# Patient Record
Sex: Male | Born: 1940 | Race: White | Hispanic: No | Smoking: Former smoker
Health system: Southern US, Community
[De-identification: ages and names within clinical notes are randomized; demographics above are authoritative.]

## PROBLEM LIST (undated history)

## (undated) DIAGNOSIS — J189 Pneumonia, unspecified organism: Secondary | ICD-10-CM

## (undated) DIAGNOSIS — M1990 Unspecified osteoarthritis, unspecified site: Secondary | ICD-10-CM

## (undated) DIAGNOSIS — G629 Polyneuropathy, unspecified: Secondary | ICD-10-CM

## (undated) DIAGNOSIS — J449 Chronic obstructive pulmonary disease, unspecified: Secondary | ICD-10-CM

## (undated) DIAGNOSIS — I10 Essential (primary) hypertension: Secondary | ICD-10-CM

## (undated) DIAGNOSIS — I5021 Acute systolic (congestive) heart failure: Secondary | ICD-10-CM

## (undated) DIAGNOSIS — I219 Acute myocardial infarction, unspecified: Secondary | ICD-10-CM

## (undated) HISTORY — PX: KNEE SURGERY: SHX244

## (undated) HISTORY — DX: Acute systolic (congestive) heart failure: I50.21

## (undated) HISTORY — PX: TONSILLECTOMY: SUR1361

## (undated) HISTORY — DX: Acute myocardial infarction, unspecified: I21.9

## (undated) HISTORY — PX: CARDIAC CATHETERIZATION: SHX172

---

## 2015-03-15 DIAGNOSIS — M170 Bilateral primary osteoarthritis of knee: Secondary | ICD-10-CM | POA: Insufficient documentation

## 2016-03-17 ENCOUNTER — Encounter (HOSPITAL_BASED_OUTPATIENT_CLINIC_OR_DEPARTMENT_OTHER): Payer: Self-pay | Admitting: *Deleted

## 2016-03-17 ENCOUNTER — Emergency Department (HOSPITAL_BASED_OUTPATIENT_CLINIC_OR_DEPARTMENT_OTHER)
Admission: EM | Admit: 2016-03-17 | Discharge: 2016-03-17 | Disposition: A | Discharge status: Home / Self Care | Payer: Medicare Other | Attending: Emergency Medicine | Admitting: Emergency Medicine

## 2016-03-17 ENCOUNTER — Emergency Department (HOSPITAL_BASED_OUTPATIENT_CLINIC_OR_DEPARTMENT_OTHER): Payer: Medicare Other

## 2016-03-17 DIAGNOSIS — S6992XA Unspecified injury of left wrist, hand and finger(s), initial encounter: Secondary | ICD-10-CM | POA: Diagnosis present

## 2016-03-17 DIAGNOSIS — Z87891 Personal history of nicotine dependence: Secondary | ICD-10-CM | POA: Insufficient documentation

## 2016-03-17 DIAGNOSIS — S62112A Displaced fracture of triquetrum [cuneiform] bone, left wrist, initial encounter for closed fracture: Secondary | ICD-10-CM

## 2016-03-17 DIAGNOSIS — S80212A Abrasion, left knee, initial encounter: Secondary | ICD-10-CM | POA: Diagnosis not present

## 2016-03-17 DIAGNOSIS — I10 Essential (primary) hypertension: Secondary | ICD-10-CM | POA: Diagnosis not present

## 2016-03-17 DIAGNOSIS — M62838 Other muscle spasm: Secondary | ICD-10-CM | POA: Insufficient documentation

## 2016-03-17 DIAGNOSIS — Y92815 Train as the place of occurrence of the external cause: Secondary | ICD-10-CM | POA: Diagnosis not present

## 2016-03-17 DIAGNOSIS — Z79899 Other long term (current) drug therapy: Secondary | ICD-10-CM | POA: Insufficient documentation

## 2016-03-17 DIAGNOSIS — Y999 Unspecified external cause status: Secondary | ICD-10-CM | POA: Diagnosis not present

## 2016-03-17 DIAGNOSIS — S62115A Nondisplaced fracture of triquetrum [cuneiform] bone, left wrist, initial encounter for closed fracture: Secondary | ICD-10-CM | POA: Diagnosis not present

## 2016-03-17 DIAGNOSIS — Y9389 Activity, other specified: Secondary | ICD-10-CM | POA: Insufficient documentation

## 2016-03-17 DIAGNOSIS — W010XXA Fall on same level from slipping, tripping and stumbling without subsequent striking against object, initial encounter: Secondary | ICD-10-CM | POA: Diagnosis not present

## 2016-03-17 HISTORY — DX: Essential (primary) hypertension: I10

## 2016-03-17 HISTORY — DX: Polyneuropathy, unspecified: G62.9
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
## 2016-03-17 NOTE — ED Provider Notes (Signed)
Tripped and fell yesterday injuring left wrist as result of fall he suffered abrasion to her left ring finger and to left knee as result. On exam he is in no distress alert Glasgow Coma Score 15 left upper extremity tiny abrasion to tip of left ring finger. Wrist is tender at dorsum. No snuffbox tenderness. Radial pulse 2+. Left lower extremity tiny abrasion to anterior knee.   Doug Sou, MD 03/17/16 2325

## 2016-03-17 NOTE — Discharge Instructions (Signed)
Please follow up with Dr. Pearletha Forge this week for wrist recheck. Leave wrist splint on until you see Dr. Pearletha Forge. Take tylenol for pain. Use heating pad on neck. Follow up with your primary doctor this week for bp recheck. Return to the ED if you develop headache, vision changes, or your neck pain worsens.

## 2016-03-17 NOTE — ED Notes (Signed)
Wound care done to left middle finger. Cleansed and bacitracin applied. Pt also c/o left side neck pain. PA advised.
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
## 2016-03-17 NOTE — ED Triage Notes (Signed)
Pt tripped at [facility] station and fell. C/o pain in left wrist

## 2016-03-21 ENCOUNTER — Encounter: Payer: Self-pay | Admitting: Family Medicine

## 2016-03-21 ENCOUNTER — Ambulatory Visit (INDEPENDENT_AMBULATORY_CARE_PROVIDER_SITE_OTHER): Payer: Medicare Other | Admitting: Family Medicine

## 2016-03-21 DIAGNOSIS — S6992XA Unspecified injury of left wrist, hand and finger(s), initial encounter: Secondary | ICD-10-CM

## 2016-03-21 NOTE — Patient Instructions (Signed)
You have a dorsal avulsion triquetral fracture. These typically do very well with conservative treatment. Wear brace at all times for 2 weeks. After this it's ok for you to take it off to wash the area, ice it for another 2 weeks but still try to wear it all other times. Follow up with me in 4 weeks. You should not need repeat x-rays for this fracture. Tylenol if needed for pain - let me know if you need something stronger than this or ibuprofen.

## 2016-03-22 DIAGNOSIS — S6992XA Unspecified injury of left wrist, hand and finger(s), initial encounter: Secondary | ICD-10-CM | POA: Insufficient documentation

## 2016-03-22 NOTE — Assessment & Plan Note (Signed)
independently reviewed radiographs showing dorsal avulsion fracture of triquetrum.  Reassured patient - expect 4-6 weeks total healing.  Can use wrist brace instead of cast.  Wear at all times the next 2 weeks then ok to take off next 2 weeks to wash and ice area.  F/u in 4 weeks.  Call with any concerns.  Tylenol as needed for pain.
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
## 2016-04-18 ENCOUNTER — Encounter: Payer: Self-pay | Admitting: Family Medicine

## 2016-04-18 ENCOUNTER — Ambulatory Visit: Payer: Medicare Other | Admitting: Family Medicine

## 2016-04-18 ENCOUNTER — Ambulatory Visit (INDEPENDENT_AMBULATORY_CARE_PROVIDER_SITE_OTHER): Payer: Medicare Other | Admitting: Family Medicine

## 2016-04-18 DIAGNOSIS — S6992XD Unspecified injury of left wrist, hand and finger(s), subsequent encounter: Secondary | ICD-10-CM | POA: Diagnosis present
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
## 2016-04-21 NOTE — Assessment & Plan Note (Signed)
2/2 dorsal avulsion fracture of triquetrum.  Clinically healed at this point.  Wrist brace only as needed.  Tylenol as needed.  F/u prn.

## 2017-07-31 DIAGNOSIS — F411 Generalized anxiety disorder: Secondary | ICD-10-CM | POA: Insufficient documentation

## 2017-07-31 DIAGNOSIS — F329 Major depressive disorder, single episode, unspecified: Secondary | ICD-10-CM | POA: Insufficient documentation

## 2018-05-20 DIAGNOSIS — N400 Enlarged prostate without lower urinary tract symptoms: Secondary | ICD-10-CM | POA: Insufficient documentation

## 2018-05-20 DIAGNOSIS — I10 Essential (primary) hypertension: Secondary | ICD-10-CM | POA: Insufficient documentation

## 2018-10-15 ENCOUNTER — Inpatient Hospital Stay (HOSPITAL_COMMUNITY)
Admission: EM | Admit: 2018-10-15 | Discharge: 2018-10-27 | DRG: 246 | Disposition: A | Discharge status: Rehabilitation Facility | Payer: Medicare Other | Attending: Internal Medicine | Admitting: Internal Medicine

## 2018-10-15 ENCOUNTER — Encounter (HOSPITAL_COMMUNITY): Payer: Self-pay | Admitting: Emergency Medicine

## 2018-10-15 ENCOUNTER — Emergency Department (HOSPITAL_COMMUNITY): Payer: Medicare Other

## 2018-10-15 ENCOUNTER — Other Ambulatory Visit: Payer: Self-pay

## 2018-10-15 DIAGNOSIS — N400 Enlarged prostate without lower urinary tract symptoms: Secondary | ICD-10-CM | POA: Diagnosis present

## 2018-10-15 DIAGNOSIS — Z20828 Contact with and (suspected) exposure to other viral communicable diseases: Secondary | ICD-10-CM | POA: Diagnosis present

## 2018-10-15 DIAGNOSIS — I5021 Acute systolic (congestive) heart failure: Secondary | ICD-10-CM | POA: Diagnosis present

## 2018-10-15 DIAGNOSIS — D72828 Other elevated white blood cell count: Secondary | ICD-10-CM | POA: Diagnosis present

## 2018-10-15 DIAGNOSIS — F411 Generalized anxiety disorder: Secondary | ICD-10-CM | POA: Diagnosis not present

## 2018-10-15 DIAGNOSIS — R0902 Hypoxemia: Secondary | ICD-10-CM

## 2018-10-15 DIAGNOSIS — Z79899 Other long term (current) drug therapy: Secondary | ICD-10-CM

## 2018-10-15 DIAGNOSIS — I2109 ST elevation (STEMI) myocardial infarction involving other coronary artery of anterior wall: Principal | ICD-10-CM

## 2018-10-15 DIAGNOSIS — Z87891 Personal history of nicotine dependence: Secondary | ICD-10-CM | POA: Diagnosis not present

## 2018-10-15 DIAGNOSIS — J9601 Acute respiratory failure with hypoxia: Secondary | ICD-10-CM

## 2018-10-15 DIAGNOSIS — I10 Essential (primary) hypertension: Secondary | ICD-10-CM

## 2018-10-15 DIAGNOSIS — R739 Hyperglycemia, unspecified: Secondary | ICD-10-CM | POA: Diagnosis not present

## 2018-10-15 DIAGNOSIS — R042 Hemoptysis: Secondary | ICD-10-CM | POA: Diagnosis not present

## 2018-10-15 DIAGNOSIS — F419 Anxiety disorder, unspecified: Secondary | ICD-10-CM | POA: Diagnosis not present

## 2018-10-15 DIAGNOSIS — I25119 Atherosclerotic heart disease of native coronary artery with unspecified angina pectoris: Secondary | ICD-10-CM | POA: Diagnosis present

## 2018-10-15 DIAGNOSIS — J189 Pneumonia, unspecified organism: Secondary | ICD-10-CM

## 2018-10-15 DIAGNOSIS — I739 Peripheral vascular disease, unspecified: Secondary | ICD-10-CM | POA: Diagnosis not present

## 2018-10-15 DIAGNOSIS — J159 Unspecified bacterial pneumonia: Secondary | ICD-10-CM | POA: Diagnosis present

## 2018-10-15 DIAGNOSIS — E878 Other disorders of electrolyte and fluid balance, not elsewhere classified: Secondary | ICD-10-CM | POA: Diagnosis present

## 2018-10-15 DIAGNOSIS — R1013 Epigastric pain: Secondary | ICD-10-CM | POA: Diagnosis not present

## 2018-10-15 DIAGNOSIS — D72829 Elevated white blood cell count, unspecified: Secondary | ICD-10-CM | POA: Diagnosis not present

## 2018-10-15 DIAGNOSIS — I2510 Atherosclerotic heart disease of native coronary artery without angina pectoris: Secondary | ICD-10-CM | POA: Diagnosis not present

## 2018-10-15 DIAGNOSIS — R778 Other specified abnormalities of plasma proteins: Secondary | ICD-10-CM

## 2018-10-15 DIAGNOSIS — Z720 Tobacco use: Secondary | ICD-10-CM | POA: Diagnosis not present

## 2018-10-15 DIAGNOSIS — E1165 Type 2 diabetes mellitus with hyperglycemia: Secondary | ICD-10-CM | POA: Diagnosis not present

## 2018-10-15 DIAGNOSIS — I4891 Unspecified atrial fibrillation: Secondary | ICD-10-CM | POA: Diagnosis not present

## 2018-10-15 DIAGNOSIS — R7989 Other specified abnormal findings of blood chemistry: Secondary | ICD-10-CM | POA: Diagnosis not present

## 2018-10-15 DIAGNOSIS — Z95828 Presence of other vascular implants and grafts: Secondary | ICD-10-CM | POA: Diagnosis not present

## 2018-10-15 DIAGNOSIS — T380X5A Adverse effect of glucocorticoids and synthetic analogues, initial encounter: Secondary | ICD-10-CM

## 2018-10-15 DIAGNOSIS — R001 Bradycardia, unspecified: Secondary | ICD-10-CM | POA: Diagnosis not present

## 2018-10-15 DIAGNOSIS — J441 Chronic obstructive pulmonary disease with (acute) exacerbation: Secondary | ICD-10-CM | POA: Diagnosis present

## 2018-10-15 DIAGNOSIS — I959 Hypotension, unspecified: Secondary | ICD-10-CM | POA: Diagnosis not present

## 2018-10-15 DIAGNOSIS — Z7289 Other problems related to lifestyle: Secondary | ICD-10-CM | POA: Diagnosis not present

## 2018-10-15 DIAGNOSIS — E669 Obesity, unspecified: Secondary | ICD-10-CM

## 2018-10-15 DIAGNOSIS — E1169 Type 2 diabetes mellitus with other specified complication: Secondary | ICD-10-CM

## 2018-10-15 DIAGNOSIS — I110 Hypertensive heart disease with heart failure: Secondary | ICD-10-CM

## 2018-10-15 DIAGNOSIS — K219 Gastro-esophageal reflux disease without esophagitis: Secondary | ICD-10-CM | POA: Diagnosis present

## 2018-10-15 DIAGNOSIS — R079 Chest pain, unspecified: Secondary | ICD-10-CM | POA: Diagnosis present

## 2018-10-15 DIAGNOSIS — Z9911 Dependence on respirator [ventilator] status: Secondary | ICD-10-CM | POA: Diagnosis not present

## 2018-10-15 DIAGNOSIS — N179 Acute kidney failure, unspecified: Secondary | ICD-10-CM | POA: Diagnosis present

## 2018-10-15 DIAGNOSIS — I509 Heart failure, unspecified: Secondary | ICD-10-CM

## 2018-10-15 DIAGNOSIS — R109 Unspecified abdominal pain: Secondary | ICD-10-CM

## 2018-10-15 DIAGNOSIS — I2102 ST elevation (STEMI) myocardial infarction involving left anterior descending coronary artery: Secondary | ICD-10-CM | POA: Diagnosis not present

## 2018-10-15 DIAGNOSIS — J449 Chronic obstructive pulmonary disease, unspecified: Secondary | ICD-10-CM | POA: Diagnosis not present

## 2018-10-15 DIAGNOSIS — Z955 Presence of coronary angioplasty implant and graft: Secondary | ICD-10-CM | POA: Diagnosis not present

## 2018-10-15 DIAGNOSIS — I712 Thoracic aortic aneurysm, without rupture: Secondary | ICD-10-CM | POA: Diagnosis present

## 2018-10-15 DIAGNOSIS — R21 Rash and other nonspecific skin eruption: Secondary | ICD-10-CM | POA: Diagnosis not present

## 2018-10-15 DIAGNOSIS — J440 Chronic obstructive pulmonary disease with acute lower respiratory infection: Secondary | ICD-10-CM | POA: Diagnosis present

## 2018-10-15 DIAGNOSIS — I5043 Acute on chronic combined systolic (congestive) and diastolic (congestive) heart failure: Secondary | ICD-10-CM | POA: Diagnosis not present

## 2018-10-15 DIAGNOSIS — R159 Full incontinence of feces: Secondary | ICD-10-CM | POA: Diagnosis not present

## 2018-10-15 DIAGNOSIS — R918 Other nonspecific abnormal finding of lung field: Secondary | ICD-10-CM | POA: Diagnosis not present

## 2018-10-15 DIAGNOSIS — F329 Major depressive disorder, single episode, unspecified: Secondary | ICD-10-CM | POA: Diagnosis present

## 2018-10-15 DIAGNOSIS — Z8051 Family history of malignant neoplasm of kidney: Secondary | ICD-10-CM

## 2018-10-15 DIAGNOSIS — E785 Hyperlipidemia, unspecified: Secondary | ICD-10-CM | POA: Diagnosis present

## 2018-10-15 DIAGNOSIS — E1142 Type 2 diabetes mellitus with diabetic polyneuropathy: Secondary | ICD-10-CM | POA: Diagnosis present

## 2018-10-15 DIAGNOSIS — R5381 Other malaise: Secondary | ICD-10-CM | POA: Diagnosis not present

## 2018-10-15 DIAGNOSIS — I400 Infective myocarditis: Secondary | ICD-10-CM | POA: Diagnosis present

## 2018-10-15 DIAGNOSIS — E876 Hypokalemia: Secondary | ICD-10-CM | POA: Diagnosis present

## 2018-10-15 DIAGNOSIS — Z9981 Dependence on supplemental oxygen: Secondary | ICD-10-CM | POA: Diagnosis not present

## 2018-10-15 DIAGNOSIS — Z801 Family history of malignant neoplasm of trachea, bronchus and lung: Secondary | ICD-10-CM

## 2018-10-15 DIAGNOSIS — I480 Paroxysmal atrial fibrillation: Secondary | ICD-10-CM | POA: Diagnosis not present

## 2018-10-15 DIAGNOSIS — Z7984 Long term (current) use of oral hypoglycemic drugs: Secondary | ICD-10-CM

## 2018-10-15 DIAGNOSIS — E871 Hypo-osmolality and hyponatremia: Secondary | ICD-10-CM

## 2018-10-15 DIAGNOSIS — I952 Hypotension due to drugs: Secondary | ICD-10-CM | POA: Diagnosis not present

## 2018-10-15 DIAGNOSIS — Z7982 Long term (current) use of aspirin: Secondary | ICD-10-CM

## 2018-10-15 DIAGNOSIS — Z95820 Peripheral vascular angioplasty status with implants and grafts: Secondary | ICD-10-CM | POA: Diagnosis not present

## 2018-10-15 DIAGNOSIS — Z7951 Long term (current) use of inhaled steroids: Secondary | ICD-10-CM | POA: Diagnosis not present

## 2018-10-15 DIAGNOSIS — Z800 Family history of malignant neoplasm of digestive organs: Secondary | ICD-10-CM

## 2018-10-15 DIAGNOSIS — N5082 Scrotal pain: Secondary | ICD-10-CM | POA: Diagnosis not present

## 2018-10-15 DIAGNOSIS — I4510 Unspecified right bundle-branch block: Secondary | ICD-10-CM | POA: Diagnosis present

## 2018-10-15 LAB — CBC
HCT: 45.5 % (ref 39.0–52.0)
HCT: 46.3 % (ref 39.0–52.0)
Hemoglobin: 14.5 g/dL (ref 13.0–17.0)
Hemoglobin: 15.1 g/dL (ref 13.0–17.0)
MCH: 30.5 pg (ref 26.0–34.0)
MCH: 30.4 pg (ref 26.0–34.0)
MCHC: 31.9 g/dL (ref 30.0–36.0)
MCHC: 32.6 g/dL (ref 30.0–36.0)
MCV: 95.6 fL (ref 80.0–100.0)
MCV: 93.3 fL (ref 80.0–100.0)
Platelets: 281 10*3/uL (ref 150–400)
Platelets: 291 10*3/uL (ref 150–400)
RBC: 4.76 MIL/uL (ref 4.22–5.81)
RBC: 4.96 MIL/uL (ref 4.22–5.81)
RDW: 13.2 % (ref 11.5–15.5)
RDW: 13.2 % (ref 11.5–15.5)
WBC: 16.6 10*3/uL — ABNORMAL HIGH (ref 4.0–10.5)
WBC: 17.2 10*3/uL — ABNORMAL HIGH (ref 4.0–10.5)
nRBC: 0 % (ref 0.0–0.2)
nRBC: 0 % (ref 0.0–0.2)

## 2018-10-15 LAB — BASIC METABOLIC PANEL
Anion gap: 15 (ref 5–15)
BUN: 15 mg/dL (ref 8–23)
CO2: 21 mmol/L — ABNORMAL LOW (ref 22–32)
Calcium: 9.1 mg/dL (ref 8.9–10.3)
Chloride: 97 mmol/L — ABNORMAL LOW (ref 98–111)
Creatinine, Ser: 1.06 mg/dL (ref 0.61–1.24)
GFR calc Af Amer: >60 mL/min (ref >60)
GFR calc non Af Amer: >60 mL/min (ref >60)
Glucose, Bld: 120 mg/dL — ABNORMAL HIGH (ref 70–99)
Potassium: 3.4 mmol/L — ABNORMAL LOW (ref 3.5–5.1)
Sodium: 133 mmol/L — ABNORMAL LOW (ref 135–145)

## 2018-10-15 LAB — DIFFERENTIAL
Abs Immature Granulocytes: 0.1 10*3/uL — ABNORMAL HIGH (ref 0.00–0.07)
Basophils Absolute: 0.1 10*3/uL (ref 0.0–0.1)
Basophils Relative: 0 %
Eosinophils Absolute: 0.1 10*3/uL (ref 0.0–0.5)
Eosinophils Relative: 1 %
Immature Granulocytes: 1 %
Lymphocytes Relative: 7 %
Lymphs Abs: 1.2 10*3/uL (ref 0.7–4.0)
Monocytes Absolute: 0.7 10*3/uL (ref 0.1–1.0)
Monocytes Relative: 4 %
Neutro Abs: 15 10*3/uL — ABNORMAL HIGH (ref 1.7–7.7)
Neutrophils Relative %: 87 %

## 2018-10-15 LAB — TROPONIN I
Troponin I: 0.05 ng/mL (ref <0.03)
Troponin I: 1.4 ng/mL (ref <0.03)

## 2018-10-15 LAB — URINALYSIS, ROUTINE W REFLEX MICROSCOPIC
Bilirubin Urine: NEGATIVE
Glucose, UA: NEGATIVE mg/dL
Hgb urine dipstick: NEGATIVE
Ketones, ur: 5 mg/dL — AB
Leukocytes,Ua: NEGATIVE
Nitrite: NEGATIVE
Protein, ur: NEGATIVE mg/dL
Specific Gravity, Urine: >1.046 — ABNORMAL HIGH (ref 1.005–1.030)
pH: 6 (ref 5.0–8.0)

## 2018-10-15 LAB — LACTIC ACID, PLASMA: Lactic Acid, Venous: 1.7 mmol/L (ref 0.5–1.9)

## 2018-10-15 LAB — SARS CORONAVIRUS 2 BY RT PCR (HOSPITAL ORDER, PERFORMED IN ~~LOC~~ HOSPITAL LAB): SARS Coronavirus 2: NEGATIVE

## 2018-10-15 LAB — ABO/RH: ABO/RH(D): A NEG

## 2018-10-15 MED ORDER — PANTOPRAZOLE SODIUM 40 MG PO TBEC
40.0000 mg | DELAYED_RELEASE_TABLET | Freq: Every day | ORAL | Status: DC
  Administered 2018-10-16 – 2018-10-27 (×12): 40 mg via ORAL
  Filled 2018-10-15 (×13): qty 1

## 2018-10-15 MED ORDER — NITROGLYCERIN 0.4 MG SL SUBL
0.4000 mg | SUBLINGUAL_TABLET | SUBLINGUAL | Status: DC | PRN
  Administered 2018-10-16 (×2): 0.4 mg via SUBLINGUAL
  Filled 2018-10-15: qty 1

## 2018-10-15 MED ORDER — LIDOCAINE VISCOUS HCL 2 % MT SOLN
15.0000 mL | Freq: Once | OROMUCOSAL | Status: AC
  Administered 2018-10-15: 15 mL via ORAL
  Filled 2018-10-15: qty 15

## 2018-10-15 MED ORDER — MORPHINE SULFATE (PF) 4 MG/ML IV SOLN
4.0000 mg | Freq: Once | INTRAVENOUS | Status: AC
  Administered 2018-10-15: 4 mg via INTRAVENOUS
  Filled 2018-10-15: qty 1

## 2018-10-15 MED ORDER — IPRATROPIUM-ALBUTEROL 20-100 MCG/ACT IN AERS
1.0000 | INHALATION_SPRAY | Freq: Four times a day (QID) | RESPIRATORY_TRACT | Status: DC
  Administered 2018-10-15 – 2018-10-17 (×4): 1 via RESPIRATORY_TRACT
  Filled 2018-10-15 (×2): qty 4

## 2018-10-15 MED ORDER — SODIUM CHLORIDE 0.9 % IV SOLN
2.0000 g | INTRAVENOUS | Status: DC
  Administered 2018-10-15 – 2018-10-16 (×2): 2 g via INTRAVENOUS
  Filled 2018-10-15 (×4): qty 20

## 2018-10-15 MED ORDER — ACETAMINOPHEN 325 MG PO TABS
650.0000 mg | ORAL_TABLET | Freq: Four times a day (QID) | ORAL | Status: DC | PRN
  Administered 2018-10-16: 650 mg via ORAL
  Filled 2018-10-15: qty 2

## 2018-10-15 MED ORDER — FAMOTIDINE IN NACL 20-0.9 MG/50ML-% IV SOLN
20.0000 mg | Freq: Once | INTRAVENOUS | Status: AC
  Administered 2018-10-15: 20 mg via INTRAVENOUS
  Filled 2018-10-15: qty 50

## 2018-10-15 MED ORDER — ENOXAPARIN SODIUM 60 MG/0.6ML ~~LOC~~ SOLN: 60.0000 mg | SUBCUTANEOUS | Status: DC

## 2018-10-15 MED ORDER — LAMOTRIGINE 25 MG PO TABS
75.0000 mg | ORAL_TABLET | Freq: Every day | ORAL | Status: DC
  Administered 2018-10-16 – 2018-10-26 (×12): 75 mg via ORAL
  Filled 2018-10-15 (×15): qty 3

## 2018-10-15 MED ORDER — IOHEXOL 350 MG/ML SOLN
100.0000 mL | Freq: Once | INTRAVENOUS | Status: AC | PRN
  Administered 2018-10-15: 20:00:00 100 mL via INTRAVENOUS

## 2018-10-15 MED ORDER — TAMSULOSIN HCL 0.4 MG PO CAPS
0.4000 mg | ORAL_CAPSULE | Freq: Every day | ORAL | Status: DC
  Administered 2018-10-16 – 2018-10-26 (×12): 0.4 mg via ORAL
  Filled 2018-10-15 (×12): qty 1

## 2018-10-15 MED ORDER — LIDOCAINE VISCOUS HCL 2 % MT SOLN: 15.0000 mL | Freq: Once | OROMUCOSAL | Status: DC

## 2018-10-15 MED ORDER — ONDANSETRON HCL 4 MG/2ML IJ SOLN
4.0000 mg | Freq: Once | INTRAMUSCULAR | Status: AC
  Administered 2018-10-15: 20:00:00 4 mg via INTRAVENOUS
  Filled 2018-10-15: qty 2

## 2018-10-15 MED ORDER — SODIUM CHLORIDE 0.9 % IV SOLN
500.0000 mg | INTRAVENOUS | Status: DC
  Administered 2018-10-15 – 2018-10-16 (×2): 500 mg via INTRAVENOUS
  Filled 2018-10-15 (×3): qty 500

## 2018-10-15 MED ORDER — ALBUTEROL SULFATE HFA 108 (90 BASE) MCG/ACT IN AERS
4.0000 | INHALATION_SPRAY | Freq: Once | RESPIRATORY_TRACT | Status: AC
  Administered 2018-10-15: 4 via RESPIRATORY_TRACT
  Filled 2018-10-15: qty 6.7

## 2018-10-15 MED ORDER — ACETAMINOPHEN 650 MG RE SUPP: 650.0000 mg | Freq: Four times a day (QID) | RECTAL | Status: DC | PRN

## 2018-10-15 MED ORDER — ALUM & MAG HYDROXIDE-SIMETH 200-200-20 MG/5ML PO SUSP
30.0000 mL | Freq: Once | ORAL | Status: AC
  Administered 2018-10-15: 18:00:00 30 mL via ORAL
  Filled 2018-10-15: qty 30

## 2018-10-15 MED ORDER — SIMVASTATIN 20 MG PO TABS: 40.0000 mg | ORAL_TABLET | Freq: Every day | ORAL | Status: DC

## 2018-10-15 MED ORDER — FLUOXETINE HCL 20 MG PO CAPS
60.0000 mg | ORAL_CAPSULE | Freq: Every day | ORAL | Status: DC
  Administered 2018-10-16 – 2018-10-27 (×12): 60 mg via ORAL
  Filled 2018-10-15 (×12): qty 3

## 2018-10-15 NOTE — ED Notes (Signed)
Pt declining nitro at this time.

## 2018-10-15 NOTE — ED Notes (Signed)
MD Freida Busman notified of troponin 1.40
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
## 2018-10-15 NOTE — ED Provider Notes (Signed)
[facility] EMERGENCY DEPARTMENT Provider Note   CSN: [id number] Arrival date & time: 10/15/18  1706    History   Chief Complaint Chief Complaint  Patient presents with  . Chest Pain    HPI Justin Bradford. is a 78 y.o. male with history of hypertension, hyperlipidemia, diabetes mellitus, peripheral neuropathy presenting for evaluation of acute onset, constant upper chest pain and throat pain beginning around 2 hours ago while at rest.  He reports the pain is a soreness and began in his throat and radiating to the upper anterior chest. Notes associated shortness of breath and diaphoresis.  Denies fevers, chills, cough, abdominal pain, nausea, vomiting, or lightheadedness.  He received 324 mg of aspirin and 1 sublingual nitroglycerin with EMS without relief in his symptoms.  He is a former smoker, quit in 2007.  Denies recreational drug use.  Drinks 2 alcoholic beverages daily.  Chart review using care everywhere shows that patient underwent Myoview stress test at an outside hospital November 2019 which showed no evidence of inducible ischemia.  Has never had a cardiac catheterization.     The history is provided by the patient.    Past Medical History:  Diagnosis Date  . Hypertension   . Peripheral neuropathy     Patient Active Problem List   Diagnosis Date Noted  . Left wrist injury 03/22/2016  . Bilateral primary osteoarthritis of knee 03/15/2015    Past Surgical History:  Procedure Laterality Date  . KNEE SURGERY          Home Medications    Prior to Admission medications   Medication Sig Start Date End Date Taking? Authorizing Provider  BREO ELLIPTA 100-25 MCG/INH AEPB Inhale 1 puff into the lungs daily. 09/25/18  Yes [provider]  FLUoxetine (PROZAC) 20 MG capsule Take 60 mg by mouth every morning. 01/28/17  Yes [provider]  lamoTRIgine (LAMICTAL) 25 MG tablet Take 75 mg by mouth at bedtime. 01/28/17  Yes [provider]  losartan (COZAAR) 25 MG tablet Take 50 mg by mouth every morning. 08/16/18  Yes [provider]  omeprazole (PRILOSEC) 20 MG capsule Take 20 mg by mouth every morning. 05/10/18  Yes [provider]  simvastatin (ZOCOR) 40 MG tablet Take 40 mg by mouth every morning.    Yes [provider]  VENTOLIN HFA 108 (90 Base) MCG/ACT inhaler Inhale 1 puff into the lungs every 4 (four) hours as needed for wheezing. 09/11/18  Yes [provider]  clonazePAM Scarlette Calico) 1 MG tablet  02/19/16   [provider]  hydrochlorothiazide (HYDRODIURIL) 50 MG tablet  01/31/16   [provider]  metFORMIN (GLUCOPHAGE) 500 MG tablet  03/02/16   [provider]  metoprolol succinate (TOPROL-XL) 50 MG 24 hr tablet  01/04/16   [provider]  oxaprozin (DAYPRO) 600 MG tablet  03/02/16   [provider]  spironolactone (ALDACTONE) 25 MG tablet  02/05/16   [provider]  tamsulosin (FLOMAX) 0.4 MG CAPS capsule  01/25/16   [provider]    Family History History reviewed. No pertinent family history.  Social History Social History   Tobacco Use  . Smoking status: Former Games developer  . Smokeless tobacco: Never Used  Substance Use Topics  . Alcohol use: Yes    Comment: 2beers/day  . Drug use: No     Allergies   Patient has no known allergies.   Review of Systems Review of Systems  Constitutional: Positive  for diaphoresis. Negative for chills and fever.  Respiratory: Positive for shortness of breath.   Cardiovascular: Positive for chest pain.  Gastrointestinal: Negative for abdominal pain, nausea and vomiting.  All other systems reviewed and are negative.    Physical Exam Updated Vital Signs BP 115/84   Pulse 79   Temp 97.6 F (36.4 C) (Oral)   Resp (!) 21   Ht 6' (1.829 m)   Wt 133.4 kg   SpO2 91%   BMI 39.87 kg/m   Physical Exam Vitals signs and nursing note reviewed.  Constitutional:       General: He is not in acute distress.    Appearance: He is well-developed.     Comments: Appears anxious  HENT:     Head: Normocephalic and atraumatic.  Eyes:     General:        Right eye: No discharge.        Left eye: No discharge.     Conjunctiva/sclera: Conjunctivae normal.  Neck:     Vascular: No JVD.     Trachea: No tracheal deviation.  Cardiovascular:     Rate and Rhythm: Normal rate and regular rhythm.     Pulses:          Carotid pulses are 2+ on the right side and 2+ on the left side.      Radial pulses are 2+ on the right side and 2+ on the left side.       Dorsalis pedis pulses are 2+ on the right side and 2+ on the left side.       Posterior tibial pulses are 2+ on the right side and 2+ on the left side.     Heart sounds: Normal heart sounds.  Pulmonary:     Effort: Tachypnea present.     Comments: Globally diminished breath sounds.  Chest:     Chest wall: No tenderness.  Abdominal:     General: There is no distension.     Palpations: Abdomen is soft. There is no mass.     Tenderness: There is no abdominal tenderness. There is no guarding.  Musculoskeletal:     Right lower leg: He exhibits no tenderness. No edema.     Left lower leg: He exhibits no tenderness. No edema.  Skin:    General: Skin is warm and dry.     Findings: No erythema.  Neurological:     Mental Status: He is alert.  Psychiatric:        Behavior: Behavior normal.      ED Treatments / Results  Labs (all labs ordered are listed, but only abnormal results are displayed) Labs Reviewed  BASIC METABOLIC PANEL - Abnormal; Notable for the following components:      Result Value   Sodium 133 (*)    Potassium 3.4 (*)    Chloride 97 (*)    CO2 21 (*)    Glucose, Bld 120 (*)    All other components within normal limits  CBC - Abnormal; Notable for the following components:   WBC 16.6 (*)    All other components within normal limits  TROPONIN I - Abnormal; Notable for the following  components:   Troponin I 0.05 (*)    All other components within normal limits  CULTURE, BLOOD (ROUTINE X 2)  CULTURE, BLOOD (ROUTINE X 2)  SARS CORONAVIRUS 2 (HOSPITAL ORDER, PERFORMED IN [facility])  TROPONIN I  LACTIC ACID, PLASMA  LACTIC ACID, PLASMA  URINALYSIS,  ROUTINE W REFLEX MICROSCOPIC  DIFFERENTIAL  CBC  ABO/RH    EKG EKG Interpretation  Date/Time:  Wednesday October 15 2018 17:19:09 EDT Ventricular Rate:  74 PR Interval:    QRS Duration: 147 QT Interval:  464 QTC Calculation: 515 R Axis:   -61 Text Interpretation:  Sinus rhythm Prolonged PR interval Right bundle branch block Anterolateral infarct, old No old tracing to compare Confirmed by Lorre Nick ([id number]) on 10/15/2018 5:24:13 PM   Radiology Dg Chest 2 View  Result Date: 10/15/2018 CLINICAL DATA:  Chest pain and sore throat EXAM: CHEST - 2 VIEW COMPARISON:  None. FINDINGS: The lungs are hyperinflated with diffuse interstitial prominence. No focal airspace consolidation or pulmonary edema. No pleural effusion or pneumothorax. Normal cardiomediastinal contours. Right basilar atelectasis. IMPRESSION: COPD without acute airspace disease. Electronically Signed   By: Deatra Robinson M.D.   On: 10/15/2018 18:21   Ct Angio Chest Pe W And/or Wo Contrast  Result Date: 10/15/2018 CLINICAL DATA:  Chest pain shortness of breath EXAM: CT ANGIOGRAPHY CHEST WITH CONTRAST TECHNIQUE: Multidetector CT imaging of the chest was performed using the standard protocol during bolus administration of intravenous contrast. Multiplanar CT image reconstructions and MIPs were obtained to evaluate the vascular anatomy. CONTRAST:  OMNIPAQUE 350 COMPARISON:  10/15/2018 FINDINGS: Cardiovascular: Thoracic aortic calcifications are noted without aneurysmal dilatation. Diffuse coronary calcifications are seen. The pulmonary artery shows a normal branching pattern without evidence of filling defect to suggest pulmonary embolism.  Mediastinum/Nodes: Thoracic inlet is within normal limits. No hilar or mediastinal adenopathy is noted. The esophagus as visualized is within normal limits. Lungs/Pleura: Lungs are well aerated bilaterally. Mild dependent atelectatic changes are seen. Scattered ground-glass densities are noted worst in the right upper lobe. Mild bronchial wall thickening is noted particularly in the lower lobes. No sizable parenchymal nodules are seen. No pleural effusion is noted. Upper Abdomen: Visualized upper abdomen is within normal limits. Musculoskeletal: Degenerative changes of the thoracic spine are seen without acute abnormality. Review of the MIP images confirms the above findings. IMPRESSION: No evidence of pulmonary embolism. Predominately lower lobe bronchial thickening with dependent atelectatic changes. Some scattered ground-glass changes are noted predominately within the right upper lobe posteriorly. These likely represent atypical pneumonia. Aortic Atherosclerosis (ICD10-I70.0). Electronically Signed   By: Alcide Clever M.D.   On: 10/15/2018 20:27    Procedures Procedures (including critical care time)  Medications Ordered in ED Medications  nitroGLYCERIN (NITROSTAT) SL tablet 0.4 mg (has no administration in time range)  cefTRIAXone (ROCEPHIN) 2 g in sodium chloride 0.9 % 100 mL IVPB (2 g Intravenous New Bag/Given 10/15/18 2057)  azithromycin (ZITHROMAX) 500 mg in sodium chloride 0.9 % 250 mL IVPB (has no administration in time range)  morphine 4 MG/ML injection 4 mg (4 mg Intravenous Given 10/15/18 1813)  alum & mag hydroxide-simeth (MAALOX/MYLANTA) 200-200-20 MG/5ML suspension 30 mL (30 mLs Oral Given 10/15/18 1813)    And  lidocaine (XYLOCAINE) 2 % viscous mouth solution 15 mL (15 mLs Oral Given 10/15/18 1813)  famotidine (PEPCID) IVPB 20 mg premix (0 mg Intravenous Stopped 10/15/18 1909)  iohexol (OMNIPAQUE) 350 MG/ML injection 100 mL (100 mLs Intravenous Contrast Given 10/15/18 2003)  ondansetron  (ZOFRAN) injection 4 mg (4 mg Intravenous Given 10/15/18 2018)  albuterol (PROVENTIL HFA;VENTOLIN HFA) 108 (90 Base) MCG/ACT inhaler 4 puff (4 puffs Inhalation Given 10/15/18 2026)     Initial Impression / Assessment and Plan / ED Course  I have reviewed the triage vital signs and the nursing  notes.  Pertinent labs & imaging results that were available during my care of the patient were reviewed by me and considered in my medical decision making (see chart for details).        Patient presenting for evaluation of acute onset sore throat and anterior chest pain with associated shortness of breath.  He is afebrile, tachypneic in the ED.  Initial SPO2 saturations were stable but upon reassessment, patient SPO2 saturations 86 to 88% on room air so he was placed on nasal cannula.  No prior history of supplemental oxygen, reports he was recently diagnosed with COPD.  No recent treatment with steroids.  Lab work reviewed by me shows a leukocytosis with WBC count 16.6, no anemia.  No renal insufficiency.  Initial troponin mildly elevated at 0.05. Spoke with Dr. Anne FuSkains who recommends obtaining serial troponins, with subsequent cardiology consultation if there is significant elevation on repeat.  CTA of the chest obtained shows findings consistent with atypical pneumonia.  He does not appear to be septic at this time.  Suspect more likely to be bacterial in nature given leukocytosis, however, given hypoxia and atypical findings on imaging, will obtain COVID testing.  Internal medicine teaching service to admit. Patient seen and evaluated by Dr. Freida BusmanAllen who agrees with assessment and plan at this time.  Justin PoseyBruce D Mayotte Jr. was evaluated in Emergency Department on 10/15/2018 for the symptoms described in the history of present illness. He was evaluated in the context of the global COVID-19 pandemic, which necessitated consideration that the patient might be at risk for infection with the SARS-CoV-2 virus that  causes COVID-19. Institutional protocols and algorithms that pertain to the evaluation of patients at risk for COVID-19 are in a state of rapid change based on information released by regulatory bodies including the CDC and federal and state organizations. These policies and algorithms were followed during the patient's care in the ED.   Final Clinical Impressions(s) / ED Diagnoses   Final diagnoses:  Atypical pneumonia  Elevated troponin    ED Discharge Orders    None       Bennye AlmFawze, Najee Manninen A, PA-C 10/15/18 2059    Lorre NickAllen, Anthony, MD 10/16/18 2245

## 2018-10-15 NOTE — ED Provider Notes (Signed)
Medical screening examination/treatment/procedure(s) were conducted as a shared visit with non-physician practitioner(s) and myself.  I personally evaluated the patient during the encounter.  EKG Interpretation  Date/Time:  Wednesday October 15 2018 17:19:09 EDT Ventricular Rate:  74 PR Interval:    QRS Duration: 147 QT Interval:  464 QTC Calculation: 515 R Axis:   -61 Text Interpretation:  Sinus rhythm Prolonged PR interval Right bundle branch block Anterolateral infarct, old No old tracing to compare Confirmed by Lorre Nick ([id number]) on 10/15/2018 5:23:90 PM 78 year old male presents from home with cough and chest pain some shortness of breath.  CT shows atypical pneumonia.  Will start on IV antibiotics.  Will order COVID test.  Will be admitted to the hospital  Fotios D Terri Skains. was evaluated in Emergency Department on 10/15/2018 for the symptoms described in the history of present illness. He was evaluated in the context of the global COVID-19 pandemic, which necessitated consideration that the patient might be at risk for infection with the SARS-CoV-2 virus that causes COVID-19. Institutional protocols and algorithms that pertain to the evaluation of patients at risk for COVID-19 are in a state of rapid change based on information released by regulatory bodies including the CDC and federal and state organizations. These policies and algorithms were followed during the patient's care in the ED.    Lorre Nick, MD 10/15/18 2033

## 2018-10-15 NOTE — ED Notes (Signed)
Troponin- 0.05

## 2018-10-15 NOTE — ED Notes (Addendum)
ED TO INPATIENT HANDOFF REPORT  ED Nurse Name and Phone #: Dorathy DaftKayla [phone]  S Name/Age/Gender Justin PoseyBruce D Aldava Jr. 78 y.o. male Room/Bed: 017C/017C  Code Status   Code Status: Full Code  Home/SNF/Other Home Patient oriented to: self, place, time and situation Is this baseline? Yes   Triage Complete: Triage complete  Chief Complaint Chest Pain  Triage Note Pt here from home with c/o chest pain , along with some sob , no fever , 324mg  asa and 1 nitro ,     Allergies No Known Allergies  Level of Care/Admitting Diagnosis ED Disposition    ED Disposition Condition Comment   Admit  [facility] [[id number]]  Level of Care: Progressive [102]  Diagnosis: Chest pain [[id number]][[id number]]  Admitting Physician: Inez CatalinaMULLEN, EMILY B [[id number]]  Attending Physician: Nena PolioMULLEN, EMILY B [[id number]]  Estimated length of stay: 3 - 4 days  Certification:: I certify this patient will need inpatient services for at least 2 midnights  Possible Covid Disease Patient Isolation: Low Risk  (Less than 4L [place] supplementation)  PT Class (Do Not Modify): Inpatient [101]  PT Acc Code (Do Not Modify): Private [1]       B Medical/Surgery History Past Medical History:  Diagnosis Date  . Hypertension   . Peripheral neuropathy    Past Surgical History:  Procedure Laterality Date  . KNEE SURGERY       A IV Location/Drains/Wounds Patient Lines/Drains/Airways Status   Active Line/Drains/Airways    Name:   Placement date:   Placement time:   Site:   Days:   Peripheral IV 10/15/18 Left Antecubital   10/15/18    1711    Antecubital   less than 1          Intake/Output Last 24 hours No intake or output data in the 24 hours ending 10/15/18 2232  Labs/Imaging Results for orders placed or performed during the hospital encounter of 10/15/18 (from the past 48 hour(s))  Basic metabolic panel     Status: Abnormal   Collection Time: 10/15/18  5:22 PM  Result Value Ref Range   Sodium 133 (L) 135 -  145 mmol/L   Potassium 3.4 (L) 3.5 - 5.1 mmol/L   Chloride 97 (L) 98 - 111 mmol/L   CO2 21 (L) 22 - 32 mmol/L   Glucose, Bld 120 (H) 70 - 99 mg/dL   BUN 15 8 - 23 mg/dL   Creatinine, Ser 1.191.06 0.61 - 1.24 mg/dL   Calcium 9.1 8.9 - 14.710.3 mg/dL   GFR calc non Af Amer >60 >60 mL/min   GFR calc Af Amer >60 >60 mL/min   Anion gap 15 5 - 15    Comment: Performed at [facility], [address] [place], [place] [address]  CBC     Status: Abnormal   Collection Time: 10/15/18  5:22 PM  Result Value Ref Range   WBC 16.6 (H) 4.0 - 10.5 K/uL   RBC 4.76 4.22 - 5.81 MIL/uL   Hemoglobin 14.5 13.0 - 17.0 g/dL   HCT 21.345.5 08.639.0 - 57.852.0 %   MCV 95.6 80.0 - 100.0 fL   MCH 30.5 26.0 - 34.0 pg   MCHC 31.9 30.0 - 36.0 g/dL   RDW 46.913.2 62.911.5 - 52.815.5 %   Platelets 281 150 - 400 K/uL   nRBC 0.0 0.0 - 0.2 %    Comment: Performed at [facility], [address] [place], [place] [address]  Troponin I -  ONCE - STAT     Status: Abnormal   Collection Time: 10/15/18  5:22 PM  Result Value Ref Range   Troponin I 0.05 (HH) <0.03 ng/mL    Comment: CRITICAL RESULT CALLED TO, READ BACK BY AND VERIFIED WITH: Daryel Gerald 1810 10/15/2018 D BRADLEY Performed at [facility], [address], [place], [place] [address]   Troponin I - ONCE - STAT     Status: Abnormal   Collection Time: 10/15/18  8:22 PM  Result Value Ref Range   Troponin I 1.40 (HH) <0.03 ng/mL    Comment: DELTA CHECK NOTED CRITICAL RESULT CALLED TO, READ BACK BY AND VERIFIED WITH: T Zanna Hawn.RN 2124 10/15/2018 WBOND Performed at [facility], [address], [place], [place] [address]   Lactic acid, plasma     Status: None   Collection Time: 10/15/18  8:50 PM  Result Value Ref Range   Lactic Acid, Venous 1.7 0.5 - 1.9 mmol/L    Comment: Performed at [facility], [address] [place], [place] [address]  ABO/Rh     Status: None   Collection Time: 10/15/18  8:51 PM  Result Value Ref Range   ABO/RH(D)      A  NEG Performed at [facility], [address], [place], [place] [address]   Differential     Status: Abnormal   Collection Time: 10/15/18  8:51 PM  Result Value Ref Range   Neutrophils Relative % 87 %   Neutro Abs 15.0 (H) 1.7 - 7.7 K/uL   Lymphocytes Relative 7 %   Lymphs Abs 1.2 0.7 - 4.0 K/uL   Monocytes Relative 4 %   Monocytes Absolute 0.7 0.1 - 1.0 K/uL   Eosinophils Relative 1 %   Eosinophils Absolute 0.1 0.0 - 0.5 K/uL   Basophils Relative 0 %   Basophils Absolute 0.1 0.0 - 0.1 K/uL   Immature Granulocytes 1 %   Abs Immature Granulocytes 0.10 (H) 0.00 - 0.07 K/uL    Comment: Performed at [facility], [address], [place], [place] [address]  CBC     Status: Abnormal   Collection Time: 10/15/18  8:51 PM  Result Value Ref Range   WBC 17.2 (H) 4.0 - 10.5 K/uL   RBC 4.96 4.22 - 5.81 MIL/uL   Hemoglobin 15.1 13.0 - 17.0 g/dL   HCT 35.6 70.1 - 41.0 %   MCV 93.3 80.0 - 100.0 fL   MCH 30.4 26.0 - 34.0 pg   MCHC 32.6 30.0 - 36.0 g/dL   RDW 30.1 31.4 - 38.8 %   Platelets 291 150 - 400 K/uL   nRBC 0.0 0.0 - 0.2 %    Comment: Performed at [facility], [address], [place], [place] [address]  Urinalysis, Routine w reflex microscopic     Status: Abnormal   Collection Time: 10/15/18  8:53 PM  Result Value Ref Range   Color, Urine YELLOW YELLOW   APPearance CLEAR CLEAR   Specific Gravity, Urine >1.046 (H) 1.005 - 1.030   pH 6.0 5.0 - 8.0   Glucose, UA NEGATIVE NEGATIVE mg/dL   Hgb urine dipstick NEGATIVE NEGATIVE   Bilirubin Urine NEGATIVE NEGATIVE   Ketones, ur 5 (A) NEGATIVE mg/dL   Protein, ur NEGATIVE NEGATIVE mg/dL   Nitrite NEGATIVE NEGATIVE   Leukocytes,Ua NEGATIVE NEGATIVE    Comment: Performed at [facility], [address], [place], [place] [address]   Dg Chest  2 View  Result Date: 10/15/2018 CLINICAL DATA:  Chest pain and sore throat EXAM: CHEST - 2 VIEW COMPARISON:  None. FINDINGS: The lungs are hyperinflated with diffuse  interstitial prominence. No focal airspace consolidation or pulmonary edema. No pleural effusion or pneumothorax. Normal cardiomediastinal contours. Right basilar atelectasis. IMPRESSION: COPD without acute airspace disease. Electronically Signed   By: Deatra Robinson M.D.   On: 10/15/2018 18:21   Ct Angio Chest Pe W And/or Wo Contrast  Result Date: 10/15/2018 CLINICAL DATA:  Chest pain shortness of breath EXAM: CT ANGIOGRAPHY CHEST WITH CONTRAST TECHNIQUE: Multidetector CT imaging of the chest was performed using the standard protocol during bolus administration of intravenous contrast. Multiplanar CT image reconstructions and MIPs were obtained to evaluate the vascular anatomy. CONTRAST:  OMNIPAQUE 350 COMPARISON:  10/15/2018 FINDINGS: Cardiovascular: Thoracic aortic calcifications are noted without aneurysmal dilatation. Diffuse coronary calcifications are seen. The pulmonary artery shows a normal branching pattern without evidence of filling defect to suggest pulmonary embolism. Mediastinum/Nodes: Thoracic inlet is within normal limits. No hilar or mediastinal adenopathy is noted. The esophagus as visualized is within normal limits. Lungs/Pleura: Lungs are well aerated bilaterally. Mild dependent atelectatic changes are seen. Scattered ground-glass densities are noted worst in the right upper lobe. Mild bronchial wall thickening is noted particularly in the lower lobes. No sizable parenchymal nodules are seen. No pleural effusion is noted. Upper Abdomen: Visualized upper abdomen is within normal limits. Musculoskeletal: Degenerative changes of the thoracic spine are seen without acute abnormality. Review of the MIP images confirms the above findings. IMPRESSION: No evidence of pulmonary embolism. Predominately lower lobe bronchial thickening with dependent atelectatic changes. Some scattered ground-glass changes are noted predominately within the right upper lobe posteriorly. These likely represent  atypical pneumonia. Aortic Atherosclerosis (ICD10-I70.0). Electronically Signed   By: Alcide Clever M.D.   On: 10/15/2018 20:27    Pending Labs Unresulted Labs (From admission, onward)    Start     Ordered   10/16/18 0800  Lipid panel  Tomorrow morning,   R     10/15/18 2215   10/16/18 0800  Basic metabolic panel  Once,   R     16/10/96 2215   10/16/18 0200  Troponin I - Now Then Q6H  Now then every 6 hours,   R     10/15/18 2113   10/15/18 2135  Hepatic function panel  Add-on,   R     10/15/18 2134   10/15/18 2033  Blood Culture (routine x 2)  BLOOD CULTURE X 2,   STAT     10/15/18 2033   10/15/18 2033  SARS Coronavirus 2 [facility] order, Performed in [facility] hospital lab)  (Novel Coronavirus, [facility] Order))  Once,   R     10/15/18 2033          Vitals/Pain Today's Vitals   10/15/18 2030 10/15/18 2045 10/15/18 2200 10/15/18 2215  BP: 115/84 127/70 123/78 120/72  Pulse: 79 75 74 74  Resp: (!) 21 (!) 21 19 (!) 22  Temp:      TempSrc:      SpO2: 91% 93% 93% 93%  Weight:      Height:      PainSc:        Isolation Precautions Airborne and Contact precautions  Medications Medications  nitroGLYCERIN (NITROSTAT) SL tablet 0.4 mg (has no administration in time range)  cefTRIAXone (ROCEPHIN) 2 g in sodium chloride 0.9 % 100 mL IVPB (0 g Intravenous Stopped 10/15/18 2156)  azithromycin (ZITHROMAX) 500 mg in sodium chloride 0.9 % 250 mL IVPB (500 mg Intravenous New Bag/Given 10/15/18 2158)  Ipratropium-Albuterol (COMBIVENT) respimat 1 puff (1 puff Inhalation Given 10/15/18 2156)  simvastatin (ZOCOR) tablet 40 mg (has no administration in time range)  FLUoxetine (PROZAC) capsule 60 mg (has no administration in time range)  pantoprazole (PROTONIX) EC tablet 40 mg (has no administration in time range)  tamsulosin (FLOMAX) capsule 0.4 mg (has no administration in time range)  lamoTRIgine (LAMICTAL) tablet 75 mg (has no administration in time range)  enoxaparin (LOVENOX)  injection 40 mg (has no administration in time range)  acetaminophen (TYLENOL) tablet 650 mg (has no administration in time range)    Or  acetaminophen (TYLENOL) suppository 650 mg (has no administration in time range)  morphine 4 MG/ML injection 4 mg (4 mg Intravenous Given 10/15/18 1813)  alum & mag hydroxide-simeth (MAALOX/MYLANTA) 200-200-20 MG/5ML suspension 30 mL (30 mLs Oral Given 10/15/18 1813)    And  lidocaine (XYLOCAINE) 2 % viscous mouth solution 15 mL (15 mLs Oral Given 10/15/18 1813)  famotidine (PEPCID) IVPB 20 mg premix (0 mg Intravenous Stopped 10/15/18 1909)  iohexol (OMNIPAQUE) 350 MG/ML injection 100 mL (100 mLs Intravenous Contrast Given 10/15/18 2003)  ondansetron (ZOFRAN) injection 4 mg (4 mg Intravenous Given 10/15/18 2018)  albuterol (PROVENTIL HFA;VENTOLIN HFA) 108 (90 Base) MCG/ACT inhaler 4 puff (4 puffs Inhalation Given 10/15/18 2026)    Mobility walks Moderate fall risk   Focused Assessments Cardiac Assessment Handoff:  Cardiac Rhythm: Normal sinus rhythm Lab Results  Component Value Date   TROPONINI 1.40 (HH) 10/15/2018   No results found for: DDIMER Does the Patient currently have chest pain? No     R Recommendations: See Admitting Provider Note  Report given to:   Additional Notes:

## 2018-10-15 NOTE — ED Notes (Signed)
ED TO INPATIENT HANDOFF REPORT  ED Nurse Name and Phone #:  Florentina Addison [phone]  S Name/Age/Gender [name]. 78 y.o. male Room/Bed: 017C/017C  Code Status   Code Status: Full Code  Home/SNF/Other Home Patient oriented to: self, place, time and situation Is this baseline? Yes   Triage Complete: Triage complete  Chief Complaint Chest Pain  Triage Note Pt here from home with c/o chest pain , along with some sob , no fever ,  asa and 1 nitro ,     Allergies No Known Allergies  Level of Care/Admitting Diagnosis ED Disposition    ED Disposition Condition Comment   Admit  Hospital Area: [facility] [[id number]]  Level of Care: Progressive [102]  Diagnosis: COPD exacerbation (HCC) [454098]  Admitting Physician: Nena Polio  Attending Physician: Nena Polio  Estimated length of stay: 5 - 7 days  Certification:: I certify this patient will need inpatient services for at least 2 midnights  Possible Covid Disease Patient Isolation: N/A  PT Class (Do Not Modify): Inpatient [101]  PT Acc Code (Do Not Modify): Private [1]       B Medical/Surgery History Past Medical History:  Diagnosis Date  . Hypertension   . Peripheral neuropathy    Past Surgical History:  Procedure Laterality Date  . KNEE SURGERY       A IV Location/Drains/Wounds Patient Lines/Drains/Airways Status   Active Line/Drains/Airways    Name:   Placement date:   Placement time:   Site:   Days:   Peripheral IV 10/15/18 Left Antecubital   10/15/18    1711    Antecubital   less than 1          Intake/Output Last 24 hours No intake or output data in the 24 hours ending 10/15/18 2354  Labs/Imaging Results for orders placed or performed during the hospital encounter of 10/15/18 (from the past 48 hour(s))  Basic metabolic panel     Status: Abnormal   Collection Time: 10/15/18  5:22 PM  Result Value Ref Range   Sodium 133 (L) 135 - 145 mmol/L   Potassium  3.4 (L) 3.5 - 5.1 mmol/L   Chloride 97 (L) 98 - 111 mmol/L   CO2 21 (L) 22 - 32 mmol/L   Glucose, Bld 120 (H) 70 - 99 mg/dL   BUN 15 8 - 23 mg/dL   Creatinine, Ser 1.19 0.61 - 1.24 mg/dL   Calcium 9.1 8.9 - 14.7 mg/dL   GFR calc non Af Amer >60 >60 mL/min   GFR calc Af Amer >60 >60 mL/min   Anion gap 15 5 - 15    Comment: Performed at [facility], [address] [place], [place] [address]  CBC     Status: Abnormal   Collection Time: 10/15/18  5:22 PM  Result Value Ref Range   WBC 16.6 (H) 4.0 - 10.5 K/uL   RBC 4.76 4.22 - 5.81 MIL/uL   Hemoglobin 14.5 13.0 - 17.0 g/dL   HCT 21.3 08.6 - 57.8 %   MCV 95.6 80.0 - 100.0 fL   MCH 30.5 26.0 - 34.0 pg   MCHC 31.9 30.0 - 36.0 g/dL   RDW 46.9 62.9 - 52.8 %   Platelets 281 150 - 400 K/uL   nRBC 0.0 0.0 - 0.2 %    Comment: Performed at [facility], [address] [place], [place] [address]  Troponin I - ONCE - STAT  Status: Abnormal   Collection Time: 10/15/18  5:22 PM  Result Value Ref Range   Troponin I 0.05 (HH) <0.03 ng/mL    Comment: CRITICAL RESULT CALLED TO, READ BACK BY AND VERIFIED WITH: Daryel Gerald 1810 10/15/2018 D BRADLEY Performed at [facility], [address], [place], [place] [address]   Troponin I - ONCE - STAT     Status: Abnormal   Collection Time: 10/15/18  8:22 PM  Result Value Ref Range   Troponin I 1.40 (HH) <0.03 ng/mL    Comment: DELTA CHECK NOTED CRITICAL RESULT CALLED TO, READ BACK BY AND VERIFIED WITH: T CARTER.RN 2124 10/15/2018 WBOND Performed at [facility], [address] [place], [place] [address]   SARS Coronavirus 2 [facility] order, Performed in [facility] hospital lab)     Status: None   Collection Time: 10/15/18  8:33 PM  Result Value Ref Range   SARS Coronavirus 2 NEGATIVE NEGATIVE    Comment: (NOTE) If result is NEGATIVE SARS-CoV-2 target nucleic acids are NOT DETECTED. The SARS-CoV-2 RNA is generally detectable in upper and lower  respiratory specimens  during the acute phase of infection. The lowest  concentration of SARS-CoV-2 viral copies this assay can detect is 250  copies / mL. A negative result does not preclude SARS-CoV-2 infection  and should not be used as the sole basis for treatment or other  patient management decisions.  A negative result may occur with  improper specimen collection / handling, submission of specimen other  than nasopharyngeal swab, presence of viral mutation(s) within the  areas targeted by this assay, and inadequate number of viral copies  (<250 copies / mL). A negative result must be combined with clinical  observations, patient history, and epidemiological information. If result is POSITIVE SARS-CoV-2 target nucleic acids are DETECTED. The SARS-CoV-2 RNA is generally detectable in upper and lower  respiratory specimens dur ing the acute phase of infection.  Positive  results are indicative of active infection with SARS-CoV-2.  Clinical  correlation with patient history and other diagnostic information is  necessary to determine patient infection status.  Positive results do  not rule out bacterial infection or co-infection with other viruses. If result is PRESUMPTIVE POSTIVE SARS-CoV-2 nucleic acids MAY BE PRESENT.   A presumptive positive result was obtained on the submitted specimen  and confirmed on repeat testing.  While 2019 novel coronavirus  (SARS-CoV-2) nucleic acids may be present in the submitted sample  additional confirmatory testing may be necessary for epidemiological  and / or clinical management purposes  to differentiate between  SARS-CoV-2 and other Sarbecovirus currently known to infect humans.  If clinically indicated additional testing with an alternate test  methodology [phone]) is advised. The SARS-CoV-2 RNA is generally  detectable in upper and lower respiratory sp ecimens during the acute  phase of infection. The expected result is Negative. Fact Sheet for Patients:   BoilerBrush.com.cy Fact Sheet for Healthcare Providers: https://pope.com/ This test is not yet approved or cleared by the [place] FDA and has been authorized for detection and/or diagnosis of SARS-CoV-2 by FDA under an Emergency Use Authorization (EUA).  This EUA will remain in effect (meaning this test can be used) for the duration of the COVID-19 declaration under Section 564(b)(1) of the Act, 21 U.S.C. section 360bbb-3(b)(1), unless the authorization is terminated or revoked sooner. Performed at [facility], [address], [place], [place] [address]   Lactic acid, plasma     Status:  None   Collection Time: 10/15/18  8:50 PM  Result Value Ref Range   Lactic Acid, Venous 1.7 0.5 - 1.9 mmol/L    Comment: Performed at [facility], [address] [place], [place] [address]  ABO/Rh     Status: None   Collection Time: 10/15/18  8:51 PM  Result Value Ref Range   ABO/RH(D)      A NEG Performed at [facility], [address] [place], [place] [address]   Differential     Status: Abnormal   Collection Time: 10/15/18  8:51 PM  Result Value Ref Range   Neutrophils Relative % 87 %   Neutro Abs 15.0 (H) 1.7 - 7.7 K/uL   Lymphocytes Relative 7 %   Lymphs Abs 1.2 0.7 - 4.0 K/uL   Monocytes Relative 4 %   Monocytes Absolute 0.7 0.1 - 1.0 K/uL   Eosinophils Relative 1 %   Eosinophils Absolute 0.1 0.0 - 0.5 K/uL   Basophils Relative 0 %   Basophils Absolute 0.1 0.0 - 0.1 K/uL   Immature Granulocytes 1 %   Abs Immature Granulocytes 0.10 (H) 0.00 - 0.07 K/uL    Comment: Performed at [facility], [address] [place], [place] [address]  CBC     Status: Abnormal   Collection Time: 10/15/18  8:51 PM  Result Value Ref Range   WBC 17.2 (H) 4.0 - 10.5 K/uL   RBC 4.96 4.22 - 5.81 MIL/uL   Hemoglobin 15.1 13.0 - 17.0 g/dL   HCT 91.446.3 78.239.0 - 95.652.0 %   MCV 93.3 80.0 - 100.0 fL   MCH 30.4 26.0 - 34.0 pg   MCHC  32.6 30.0 - 36.0 g/dL   RDW 21.313.2 08.611.5 - 57.815.5 %   Platelets 291 150 - 400 K/uL   nRBC 0.0 0.0 - 0.2 %    Comment: Performed at [facility], [address] [place], [place] [address]  Urinalysis, Routine w reflex microscopic     Status: Abnormal   Collection Time: 10/15/18  8:53 PM  Result Value Ref Range   Color, Urine YELLOW YELLOW   APPearance CLEAR CLEAR   Specific Gravity, Urine >1.046 (H) 1.005 - 1.030   pH 6.0 5.0 - 8.0   Glucose, UA NEGATIVE NEGATIVE mg/dL   Hgb urine dipstick NEGATIVE NEGATIVE   Bilirubin Urine NEGATIVE NEGATIVE   Ketones, ur 5 (A) NEGATIVE mg/dL   Protein, ur NEGATIVE NEGATIVE mg/dL   Nitrite NEGATIVE NEGATIVE   Leukocytes,Ua NEGATIVE NEGATIVE    Comment: Performed at [facility], [address] [place], [place] [address]   Dg Chest 2 View  Result Date: 10/15/2018 CLINICAL DATA:  Chest pain and sore throat EXAM: CHEST - 2 VIEW COMPARISON:  None. FINDINGS: The lungs are hyperinflated with diffuse interstitial prominence. No focal airspace consolidation or pulmonary edema. No pleural effusion or pneumothorax. Normal cardiomediastinal contours. Right basilar atelectasis. IMPRESSION: COPD without acute airspace disease. Electronically Signed   By: Deatra RobinsonKevin  Herman M.D.   On: 10/15/2018 18:21   Ct Angio Chest Pe W And/or Wo Contrast  Result Date: 10/15/2018 CLINICAL DATA:  Chest pain shortness of breath EXAM: CT ANGIOGRAPHY CHEST WITH CONTRAST TECHNIQUE: Multidetector CT imaging of the chest was performed using the standard protocol during bolus administration of intravenous contrast. Multiplanar CT image reconstructions and MIPs were obtained to evaluate the vascular anatomy. CONTRAST:  100mL OMNIPAQUE 350 COMPARISON:  10/15/2018 FINDINGS: Cardiovascular: Thoracic aortic calcifications are  noted without aneurysmal dilatation. Diffuse coronary calcifications are seen. The pulmonary artery shows a normal branching pattern without evidence of filling defect  to suggest pulmonary embolism. Mediastinum/Nodes: Thoracic inlet is within normal limits. No hilar or mediastinal adenopathy is noted. The esophagus as visualized is within normal limits. Lungs/Pleura: Lungs are well aerated bilaterally. Mild dependent atelectatic changes are seen. Scattered ground-glass densities are noted worst in the right upper lobe. Mild bronchial wall thickening is noted particularly in the lower lobes. No sizable parenchymal nodules are seen. No pleural effusion is noted. Upper Abdomen: Visualized upper abdomen is within normal limits. Musculoskeletal: Degenerative changes of the thoracic spine are seen without acute abnormality. Review of the MIP images confirms the above findings. IMPRESSION: No evidence of pulmonary embolism. Predominately lower lobe bronchial thickening with dependent atelectatic changes. Some scattered ground-glass changes are noted predominately within the right upper lobe posteriorly. These likely represent atypical pneumonia. Aortic Atherosclerosis (ICD10-I70.0). Electronically Signed   By: Alcide Clever M.D.   On: 10/15/2018 20:27    Pending Labs Unresulted Labs (From admission, onward)    Start     Ordered   10/16/18 0800  Lipid panel  Tomorrow morning,   R     10/15/18 2215   10/16/18 0800  Basic metabolic panel  Once,   R     15/94/58 2215   10/16/18 0200  Troponin I - Now Then Q6H  Now then every 6 hours,   R     10/15/18 2113   10/15/18 2317  Influenza panel by PCR (type A & B)  (Influenza PCR Panel)  Add-on,   R     10/15/18 2316   10/15/18 2252  Legionella Pneumophila Serogp 1 Ur Ag  Once,   R     10/15/18 2253   10/15/18 2135  Hepatic function panel  Add-on,   R     10/15/18 2134   10/15/18 2033  Blood Culture (routine x 2)  BLOOD CULTURE X 2,   STAT     10/15/18 2033          Vitals/Pain Today's Vitals   10/15/18 2230 10/15/18 2300 10/15/18 2315 10/15/18 2354  BP: 111/67 100/88 117/72   Pulse: 75 76 75   Resp: (!) 22 17 (!) 25    Temp:      TempSrc:      SpO2: 93% (!) 89% 92%   Weight:      Height:      PainSc:    2     Isolation Precautions Airborne and Contact precautions  Medications Medications  nitroGLYCERIN (NITROSTAT) SL tablet 0.4 mg (has no administration in time range)  cefTRIAXone (ROCEPHIN) 2 g in sodium chloride 0.9 % 100 mL IVPB (0 g Intravenous Stopped 10/15/18 2156)  azithromycin (ZITHROMAX) 500 mg in sodium chloride 0.9 % 250 mL IVPB (500 mg Intravenous New Bag/Given 10/15/18 2158)  Ipratropium-Albuterol (COMBIVENT) respimat 1 puff (1 puff Inhalation Given 10/15/18 2156)  simvastatin (ZOCOR) tablet 40 mg (has no administration in time range)  FLUoxetine (PROZAC) capsule 60 mg (has no administration in time range)  pantoprazole (PROTONIX) EC tablet 40 mg (has no administration in time range)  tamsulosin (FLOMAX) capsule 0.4 mg (has no administration in time range)  lamoTRIgine (LAMICTAL) tablet 75 mg (has no administration in time range)  enoxaparin (LOVENOX) injection 60 mg (has no administration in time range)  acetaminophen (TYLENOL) tablet 650 mg (has no administration in time range)    Or  acetaminophen (TYLENOL) suppository  650 mg (has no administration in time range)  morphine 4 MG/ML injection 4 mg (4 mg Intravenous Given 10/15/18 1813)  alum & mag hydroxide-simeth (MAALOX/MYLANTA) 200-200-20 MG/5ML suspension 30 mL (30 mLs Oral Given 10/15/18 1813)    And  lidocaine (XYLOCAINE) 2 % viscous mouth solution 15 mL (15 mLs Oral Given 10/15/18 1813)  famotidine (PEPCID) IVPB 20 mg premix (0 mg Intravenous Stopped 10/15/18 1909)  iohexol (OMNIPAQUE) 350 MG/ML injection 100 mL (100 mLs Intravenous Contrast Given 10/15/18 2003)  ondansetron (ZOFRAN) injection 4 mg (4 mg Intravenous Given 10/15/18 2018)  albuterol (PROVENTIL HFA;VENTOLIN HFA) 108 (90 Base) MCG/ACT inhaler 4 puff (4 puffs Inhalation Given 10/15/18 2026)    Mobility walks Moderate fall risk   Focused Assessments Cardiac  Assessment Handoff:  Cardiac Rhythm: Normal sinus rhythm Lab Results  Component Value Date   TROPONINI 1.40 (HH) 10/15/2018   No results found for: DDIMER Does the Patient currently have chest pain? Yes     R Recommendations: See Admitting Provider Note  Report given to:   Additional Notes:  Patient reports 2/10 chest pain describes as pressure when he breathes. Wearing 2L nasal cannula at this time.

## 2018-10-15 NOTE — H&P (Addendum)
Date: 10/15/2018               Patient Name:  Justin Bradford. MRN: [id number]  DOB: August 31, 1940 Age / Sex: 78 y.o., male   PCP: Forrest Moron, MD         Medical Service: Internal Medicine Teaching Service         Attending Physician: Dr. Inez Catalina, MD    First Contact: Dr. Lenward Chancellor Pager: [phone]  Second Contact: Dr. Lanelle Bal Pager: [phone]       After Hours (After 5p/  First Contact Pager: [phone]  weekends / holidays): Second Contact Pager: [phone]   Chief Complaint: Chest pain  History of Present Illness:  [name] is a 78 yo M w/ PMH of HTN, HLD, T2DM, BPH, obesity presenting with chest pain. He was in his usual state of health until around 3pm today when he experienced acute onset sore throat with associated chest pain. He describes the chest pain as pressure 'like a weight is pressing on it' 4/10 severity while watching TV on his cough. No significant relief with nitroglycerin. 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 He mentions that he had significant malaise and nausea and came to the ED. In the ED he had episode of NBNB vomiting and has been feeling subjective fevers and shortness of breath.  He states he was recently diagnosed with COPD at [facility] and was started on [facility]. He had an acute dyspnea on exertion episode at [facility] on 05/2018 and was admitted for chest pain r/o and had a myoview stress test done which was negative. He states he has no known cardiac disease and denies significant family hx. At baseline he starts to feel dyspneic after walking back and froth to his mailbox.  In the ED, he was found to be have leukocytosis w/ left shift noted to be hypoxic at 86% with mildly elevated troponin at 0.05. Dr.Skains was consulted who recommended trending troponin. CTA chest was  performed showing atypical pneumonia but no pulmonary embolism. COVID testing was collected.  Meds:  Current Meds  Medication Sig  . aspirin EC 81 MG tablet Take 81 mg by mouth daily.  Marland Kitchen BREO ELLIPTA 100-25 MCG/INH AEPB Inhale 1 puff into the lungs daily.  Marland Kitchen FLUoxetine (PROZAC) 20 MG capsule Take 60 mg by mouth every morning.  . hydrochlorothiazide (HYDRODIURIL) 50 MG tablet Take 50 mg by mouth every morning.   . lamoTRIgine (LAMICTAL) 25 MG tablet Take 75 mg by mouth at bedtime.  Marland Kitchen losartan (COZAAR) 25 MG tablet Take 50 mg by mouth every morning.  . metFORMIN (GLUCOPHAGE) 500 MG tablet Take 250 mg by mouth every morning.   Marland Kitchen omeprazole (PRILOSEC) 20 MG capsule Take 20 mg by mouth every morning.  . simvastatin (ZOCOR) 40 MG tablet Take 40 mg by mouth every morning.   Marland Kitchen spironolactone (ALDACTONE) 25 MG tablet Take 25 mg by mouth 2 (two) times daily.   . tamsulosin (FLOMAX) 0.4 MG CAPS capsule Take 0.4 mg by mouth at bedtime.   . VENTOLIN HFA 108 (90 Base) MCG/ACT inhaler Inhale 1 puff into the lungs every 4 (four) hours as needed for wheezing.   Allergies: Allergies as of 10/15/2018  . (No Known Allergies)   Past Medical History:  Diagnosis Date  . Hypertension   .  Peripheral neuropathy    Family History:  Father had renal cell carcinoma. Mother had colon carcinoma, lung cancer and RCC.  Social History: Currently retired. 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 Prior hx of significant tobacco use 45 pack year smoking history but quit 13 years ago. Is a widower and lives by himself. Drinks about two 12oz cans of beer daily. Last drink early afternoon today. Denies illicit substance use.  Social History   Tobacco Use  . Smoking status: Former Games developermoker  . Smokeless tobacco: Never Used  Substance Use Topics  . Alcohol use: Yes    Comment: 2beers/day  . Drug use: No   Review of Systems: A complete ROS was negative except as per HPI.  Physical Exam: Blood pressure  100/88, pulse 76, temperature 97.6 F (36.4 C), temperature source Oral, resp. rate 17, height 6' (1.829 m), weight 133.4 kg, SpO2 (!) 89 %. Physical Exam  Constitutional: He is oriented to person, place, and time. He appears distressed.  Ill-appearing, obese  HENT:  Mouth/Throat: Oropharynx is clear and moist.  Eyes: Conjunctivae are normal.  Neck: Normal range of motion. Neck supple. No JVD present.  Cardiovascular: Normal rate, regular rhythm, normal heart sounds and intact distal pulses.  No murmur heard. Pulmonary/Chest: Effort normal. He has wheezes (End-expiratory wheeze).  distant breath sounds  Abdominal: Soft. Bowel sounds are normal. There is no abdominal tenderness.  Musculoskeletal: Normal range of motion.        General: No edema.  Neurological: He is alert and oriented to person, place, and time.  Skin: Skin is warm. He is diaphoretic.   EKG: personally reviewed my interpretation is normal sinus, left axis deviation, right bundle branch block, v1, v2 t wave inversions, no prior EKG for comparison.  CXR: personally reviewed my interpretation is right lower lobe atelectasis, no lobar consolidation, no pleural effusion, no vascular congestion.   Assessment & Plan by Problem: Active Problems:   Chest pain  [name] is a 78 yo M w/ PMH of HTN, HLD, T2DM, BPH, obesity presenting with evaluation of acute onset chest pain, nausea, malaise, subjective fever, and dyspnea most likely 2/2 viral illness. 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 Atypical chest pain most likely 2/2 viral myocarditis. Prior stress test reassuring. Following troponin trend. His chest CT shows signs of atypical pneumonia with leukocytosis so CAP is also on differential. Will empirically treat for COPD exacerbation and CAP  while providing supportive care for possible COVID-19 and f/u lab results.  Atypical Chest pain 2/2 viral myocarditis vs ACS Troponin trend 0.05->1.4 substernal chest pain but non-radiating. Heart score of 5 due to age, risk factors and troponin. NM stress test performed outside hospital on 05/2018 shows no perfusion defect. EF of 68%. Discussed case with Dr.Akhter on call who recommend continuing to follow troponin and start heparin if trop continues to rise.  - Continue troponins q6hr - AM EKG - Telemetry - Lipid panel - Consult cardiology if troponin continues to trend upward - Nitroglycerin 0.4mg  SL q725min PRN   Acute hypoxic respiratory failure 2/2 COPD exacerbation vs atypical pneumonia 93 o2 sat on 2L [place]. Does not use oxygen at home. CTA chest shows no PE but lower lobe atelectatic changes and right upper lobe ground-glass opacity. WBC 17.2 w/ left shift. Lactate 1.7. Last PFT 08/2018 FVC/FEV  ratio 73%, FEV1 71% predicted, FVC 73% Unable to visualize volume loops. Does not meet criteria but sxs and story consistent with COPD. 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 On Breo for maintenance although no hx of asthma. - Start azitromycin 500mg  daily, Ceftriaxone 2g daily - Combivent inhaler q6hr - Legionella urinary antigen testing  - F/u COVID-19 test  ADDENDUM: COVID-19 negative. Will get influenza panel. Continue precautions in case of false negative - Airborne and contact precautions - F/u blood culture  Hypokalemia, hyponatremia, hypochloremia NA 133, K 3.4, Cl 97 All slightly low near borderline. Receiving fluids w/ abx - Trend BMP  HTN Current bp 100/88 On metoprolol, HCTZ, and spironolactone on home regimen. - Holding home antihypertensives in setting of soft bp  T2DM - Holding home metformin during inpatient stay  GERD - C/w home meds: pantoprazole 40mg  daily  HLD - c/w home meds: simvastatin 40mg  daily  BPH - C/w home meds: tamsulosin 0.4mg  qhs   Anxiety/Depression - c/w home meds: fluoxetine 60mg  daily, lamotrigine 75mg  qhs  DVT prophx: Lovenox Diet: Cardiac Bowel: Senokot Code: Full  Dispo: Admit patient to Inpatient with expected length of stay greater than 2 midnights.  Signed: Theotis Barrio, MD 10/15/2018, 11:10 PM  Pager: [phone]

## 2018-10-15 NOTE — ED Triage Notes (Signed)
Pt here from home with c/o chest pain , along with some sob , no fever , 324mg  asa and 1 nitro ,

## 2018-10-15 NOTE — ED Notes (Signed)
Patient transported to X-ray 

## 2018-10-16 ENCOUNTER — Inpatient Hospital Stay (HOSPITAL_COMMUNITY): Payer: Medicare Other

## 2018-10-16 ENCOUNTER — Encounter (HOSPITAL_COMMUNITY)
Admission: EM | Disposition: A | Discharge status: Rehabilitation Facility | Payer: Self-pay | Source: Home / Self Care | Attending: Internal Medicine

## 2018-10-16 DIAGNOSIS — I2109 ST elevation (STEMI) myocardial infarction involving other coronary artery of anterior wall: Secondary | ICD-10-CM

## 2018-10-16 DIAGNOSIS — K219 Gastro-esophageal reflux disease without esophagitis: Secondary | ICD-10-CM

## 2018-10-16 DIAGNOSIS — E878 Other disorders of electrolyte and fluid balance, not elsewhere classified: Secondary | ICD-10-CM

## 2018-10-16 DIAGNOSIS — F419 Anxiety disorder, unspecified: Secondary | ICD-10-CM

## 2018-10-16 DIAGNOSIS — R918 Other nonspecific abnormal finding of lung field: Secondary | ICD-10-CM

## 2018-10-16 DIAGNOSIS — N400 Enlarged prostate without lower urinary tract symptoms: Secondary | ICD-10-CM

## 2018-10-16 DIAGNOSIS — I2510 Atherosclerotic heart disease of native coronary artery without angina pectoris: Secondary | ICD-10-CM

## 2018-10-16 DIAGNOSIS — F339 Major depressive disorder, recurrent, unspecified: Secondary | ICD-10-CM

## 2018-10-16 DIAGNOSIS — I4510 Unspecified right bundle-branch block: Secondary | ICD-10-CM

## 2018-10-16 DIAGNOSIS — Z20828 Contact with and (suspected) exposure to other viral communicable diseases: Secondary | ICD-10-CM

## 2018-10-16 DIAGNOSIS — J189 Pneumonia, unspecified organism: Secondary | ICD-10-CM

## 2018-10-16 DIAGNOSIS — E785 Hyperlipidemia, unspecified: Secondary | ICD-10-CM

## 2018-10-16 DIAGNOSIS — R7989 Other specified abnormal findings of blood chemistry: Secondary | ICD-10-CM

## 2018-10-16 DIAGNOSIS — R778 Other specified abnormalities of plasma proteins: Secondary | ICD-10-CM

## 2018-10-16 DIAGNOSIS — Z7984 Long term (current) use of oral hypoglycemic drugs: Secondary | ICD-10-CM

## 2018-10-16 DIAGNOSIS — R079 Chest pain, unspecified: Secondary | ICD-10-CM

## 2018-10-16 DIAGNOSIS — I2102 ST elevation (STEMI) myocardial infarction involving left anterior descending coronary artery: Secondary | ICD-10-CM

## 2018-10-16 HISTORY — PX: LEFT HEART CATH AND CORONARY ANGIOGRAPHY: CATH118249

## 2018-10-16 HISTORY — PX: CORONARY STENT INTERVENTION: CATH118234

## 2018-10-16 LAB — CBC
HCT: 46.4 % (ref 39.0–52.0)
HCT: 46.5 % (ref 39.0–52.0)
Hemoglobin: 15.1 g/dL (ref 13.0–17.0)
Hemoglobin: 15.3 g/dL (ref 13.0–17.0)
MCH: 29.8 pg (ref 26.0–34.0)
MCH: 30.5 pg (ref 26.0–34.0)
MCHC: 32.5 g/dL (ref 30.0–36.0)
MCHC: 32.9 g/dL (ref 30.0–36.0)
MCV: 91.7 fL (ref 80.0–100.0)
MCV: 92.6 fL (ref 80.0–100.0)
Platelets: 303 10*3/uL (ref 150–400)
Platelets: 289 10*3/uL (ref 150–400)
RBC: 5.06 MIL/uL (ref 4.22–5.81)
RBC: 5.02 MIL/uL (ref 4.22–5.81)
RDW: 13.2 % (ref 11.5–15.5)
RDW: 13.3 % (ref 11.5–15.5)
WBC: 24.6 10*3/uL — ABNORMAL HIGH (ref 4.0–10.5)
WBC: 21.2 10*3/uL — ABNORMAL HIGH (ref 4.0–10.5)
nRBC: 0 % (ref 0.0–0.2)
nRBC: 0 % (ref 0.0–0.2)

## 2018-10-16 LAB — RESPIRATORY PANEL BY PCR

## 2018-10-16 LAB — HEPARIN LEVEL (UNFRACTIONATED): Heparin Unfractionated: 0.3 IU/mL (ref 0.30–0.70)

## 2018-10-16 LAB — LIPID PANEL
Cholesterol: 191 mg/dL (ref 0–200)
HDL: 62 mg/dL (ref >40)
LDL Cholesterol: 119 mg/dL — ABNORMAL HIGH (ref 0–99)
Total CHOL/HDL Ratio: 3.1 RATIO
Triglycerides: 49 mg/dL (ref <150)
VLDL: 10 mg/dL (ref 0–40)

## 2018-10-16 LAB — SARS CORONAVIRUS 2 BY RT PCR (HOSPITAL ORDER, PERFORMED IN ~~LOC~~ HOSPITAL LAB): SARS Coronavirus 2: NEGATIVE

## 2018-10-16 LAB — TROPONIN I
Troponin I: 12.54 ng/mL (ref <0.03)
Troponin I: 15.5 ng/mL (ref <0.03)
Troponin I: >65 ng/mL (ref <0.03)

## 2018-10-16 LAB — PLATELET COUNT: Platelets: 286 10*3/uL (ref 150–400)

## 2018-10-16 LAB — COMPREHENSIVE METABOLIC PANEL
ALT: 37 U/L (ref 0–44)
AST: 163 U/L — ABNORMAL HIGH (ref 15–41)
Albumin: 3.9 g/dL (ref 3.5–5.0)
Alkaline Phosphatase: 75 U/L (ref 38–126)
Anion gap: 14 (ref 5–15)
BUN: 17 mg/dL (ref 8–23)
CO2: 21 mmol/L — ABNORMAL LOW (ref 22–32)
Calcium: 8.6 mg/dL — ABNORMAL LOW (ref 8.9–10.3)
Chloride: 98 mmol/L (ref 98–111)
Creatinine, Ser: 1.13 mg/dL (ref 0.61–1.24)
GFR calc Af Amer: >60 mL/min (ref >60)
GFR calc non Af Amer: >60 mL/min (ref >60)
Glucose, Bld: 171 mg/dL — ABNORMAL HIGH (ref 70–99)
Potassium: 3.9 mmol/L (ref 3.5–5.1)
Sodium: 133 mmol/L — ABNORMAL LOW (ref 135–145)
Total Bilirubin: 1.2 mg/dL (ref 0.3–1.2)
Total Protein: 7 g/dL (ref 6.5–8.1)

## 2018-10-16 LAB — BLOOD GAS, VENOUS
Acid-base deficit: 1.1 mmol/L (ref 0.0–2.0)
Bicarbonate: 23.7 mmol/L (ref 20.0–28.0)
O2 Content: 5 L/min
O2 Saturation: 91 %
Patient temperature: 98.6
pCO2, Ven: 43.8 mmHg — ABNORMAL LOW (ref 44.0–60.0)
pH, Ven: 7.353 (ref 7.250–7.430)
pO2, Ven: 63.3 mmHg — ABNORMAL HIGH (ref 32.0–45.0)

## 2018-10-16 LAB — STREP PNEUMONIAE URINARY ANTIGEN: Strep Pneumo Urinary Antigen: NEGATIVE

## 2018-10-16 LAB — MRSA PCR SCREENING: MRSA by PCR: NEGATIVE

## 2018-10-16 LAB — INFLUENZA PANEL BY PCR (TYPE A & B)
Influenza A By PCR: NEGATIVE
Influenza B By PCR: NEGATIVE

## 2018-10-16 SURGERY — LEFT HEART CATH AND CORONARY ANGIOGRAPHY
Anesthesia: LOCAL

## 2018-10-16 MED ORDER — TICAGRELOR 90 MG PO TABS
90.0000 mg | ORAL_TABLET | Freq: Two times a day (BID) | ORAL | Status: DC
  Administered 2018-10-16 – 2018-10-27 (×22): 90 mg via ORAL
  Filled 2018-10-16 (×22): qty 1

## 2018-10-16 MED ORDER — HEPARIN (PORCINE) 25000 UT/250ML-% IV SOLN
1550.0000 [IU]/h | INTRAVENOUS | Status: DC
  Administered 2018-10-16: 1400 [IU]/h via INTRAVENOUS
  Filled 2018-10-16: qty 250

## 2018-10-16 MED ORDER — LIDOCAINE VISCOUS HCL 2 % MT SOLN
15.0000 mL | Freq: Once | OROMUCOSAL | Status: AC
  Administered 2018-10-16: 02:00:00 15 mL via ORAL
  Filled 2018-10-16: qty 15

## 2018-10-16 MED ORDER — TICAGRELOR 90 MG PO TABS
ORAL_TABLET | ORAL | Status: AC
  Filled 2018-10-16: qty 2

## 2018-10-16 MED ORDER — ALUM & MAG HYDROXIDE-SIMETH 200-200-20 MG/5ML PO SUSP
30.0000 mL | Freq: Once | ORAL | Status: DC
  Filled 2018-10-16: qty 30

## 2018-10-16 MED ORDER — TIROFIBAN HCL IN NACL 5-0.9 MG/100ML-% IV SOLN
0.1500 ug/kg/min | INTRAVENOUS | Status: AC
  Administered 2018-10-16 – 2018-10-17 (×3): 0.15 ug/kg/min via INTRAVENOUS
  Filled 2018-10-16 (×4): qty 100

## 2018-10-16 MED ORDER — HEPARIN BOLUS VIA INFUSION
4000.0000 [IU] | Freq: Once | INTRAVENOUS | Status: DC
  Administered 2018-10-16: 4000 [IU] via INTRAVENOUS

## 2018-10-16 MED ORDER — SODIUM CHLORIDE 0.9 % IV SOLN: INTRAVENOUS | Status: DC

## 2018-10-16 MED ORDER — HEPARIN (PORCINE) IN NACL 1000-0.9 UT/500ML-% IV SOLN
INTRAVENOUS | Status: DC | PRN
  Administered 2018-10-16 (×3): 500 mL

## 2018-10-16 MED ORDER — TIROFIBAN (AGGRASTAT) BOLUS VIA INFUSION
INTRAVENOUS | Status: DC | PRN
  Administered 2018-10-16: 3302.5 ug via INTRAVENOUS

## 2018-10-16 MED ORDER — SODIUM CHLORIDE 0.9 % IV SOLN
250.0000 mL | INTRAVENOUS | Status: DC | PRN
  Administered 2018-10-16 – 2018-10-20 (×2): 250 mL via INTRAVENOUS

## 2018-10-16 MED ORDER — HYDROMORPHONE HCL 1 MG/ML IJ SOLN
1.0000 mg | INTRAMUSCULAR | Status: DC | PRN
  Administered 2018-10-16 (×2): 1 mg via INTRAVENOUS
  Filled 2018-10-16: qty 1

## 2018-10-16 MED ORDER — SODIUM CHLORIDE 0.9 % WEIGHT BASED INFUSION: 3.0000 mL/kg/h | INTRAVENOUS | Status: DC

## 2018-10-16 MED ORDER — ASPIRIN 81 MG PO CHEW: 81.0000 mg | CHEWABLE_TABLET | ORAL | Status: DC

## 2018-10-16 MED ORDER — HEPARIN SODIUM (PORCINE) 5000 UNIT/ML IJ SOLN
5000.0000 [IU] | Freq: Three times a day (TID) | INTRAMUSCULAR | Status: DC
  Administered 2018-10-17 – 2018-10-19 (×7): 5000 [IU] via SUBCUTANEOUS
  Filled 2018-10-16 (×7): qty 1

## 2018-10-16 MED ORDER — HEPARIN (PORCINE) IN NACL 1000-0.9 UT/500ML-% IV SOLN
INTRAVENOUS | Status: AC
  Filled 2018-10-16: qty 1000

## 2018-10-16 MED ORDER — NITROGLYCERIN IN D5W 200-5 MCG/ML-% IV SOLN
0.0000 ug/min | INTRAVENOUS | Status: DC
  Administered 2018-10-16: 5 ug/min via INTRAVENOUS
  Filled 2018-10-16: qty 250

## 2018-10-16 MED ORDER — LIDOCAINE HCL (PF) 1 % IJ SOLN
INTRAMUSCULAR | Status: DC | PRN
  Administered 2018-10-16: 2 mL

## 2018-10-16 MED ORDER — FUROSEMIDE 10 MG/ML IJ SOLN: 40.0000 mg | Freq: Once | INTRAMUSCULAR | Status: DC

## 2018-10-16 MED ORDER — ATORVASTATIN CALCIUM 80 MG PO TABS
80.0000 mg | ORAL_TABLET | Freq: Every day | ORAL | Status: DC
  Administered 2018-10-16 – 2018-10-26 (×11): 80 mg via ORAL
  Filled 2018-10-16 (×11): qty 1

## 2018-10-16 MED ORDER — MORPHINE SULFATE (PF) 2 MG/ML IV SOLN
2.0000 mg | INTRAVENOUS | Status: DC | PRN
  Administered 2018-10-16 – 2018-10-23 (×3): 2 mg via INTRAVENOUS
  Administered 2018-10-23: 4 mg via INTRAVENOUS
  Filled 2018-10-16 (×3): qty 1
  Filled 2018-10-16: qty 2

## 2018-10-16 MED ORDER — ACETAMINOPHEN 325 MG PO TABS
650.0000 mg | ORAL_TABLET | ORAL | Status: DC | PRN
  Administered 2018-10-17 – 2018-10-18 (×4): 650 mg via ORAL
  Filled 2018-10-16 (×4): qty 2

## 2018-10-16 MED ORDER — HYDROMORPHONE HCL 1 MG/ML IJ SOLN
1.0000 mg | Freq: Once | INTRAMUSCULAR | Status: AC
  Administered 2018-10-16: 1 mg via INTRAVENOUS
  Filled 2018-10-16: qty 1

## 2018-10-16 MED ORDER — ATORVASTATIN CALCIUM 80 MG PO TABS
80.0000 mg | ORAL_TABLET | Freq: Every day | ORAL | Status: DC
  Administered 2018-10-16: 80 mg via ORAL
  Filled 2018-10-16: qty 1

## 2018-10-16 MED ORDER — FUROSEMIDE 10 MG/ML IJ SOLN
80.0000 mg | Freq: Once | INTRAMUSCULAR | Status: AC
  Administered 2018-10-16: 80 mg via INTRAVENOUS
  Filled 2018-10-16: qty 8

## 2018-10-16 MED ORDER — SODIUM CHLORIDE 0.9 % IV SOLN
INTRAVENOUS | Status: DC
  Administered 2018-10-16: 16:00:00 via INTRAVENOUS
  Administered 2018-10-19: 10 mL/h via INTRAVENOUS
  Administered 2018-10-19: 09:00:00 via INTRAVENOUS

## 2018-10-16 MED ORDER — HYDROMORPHONE HCL 1 MG/ML IJ SOLN
1.0000 mg | Freq: Once | INTRAMUSCULAR | Status: DC
  Filled 2018-10-16: qty 1

## 2018-10-16 MED ORDER — LIDOCAINE HCL (PF) 1 % IJ SOLN
INTRAMUSCULAR | Status: AC
  Filled 2018-10-16: qty 30

## 2018-10-16 MED ORDER — NITROGLYCERIN 2 % TD OINT
0.5000 [in_us] | TOPICAL_OINTMENT | Freq: Four times a day (QID) | TRANSDERMAL | Status: DC
  Filled 2018-10-16: qty 30

## 2018-10-16 MED ORDER — LABETALOL HCL 5 MG/ML IV SOLN: 10.0000 mg | INTRAVENOUS | Status: AC | PRN

## 2018-10-16 MED ORDER — NITROGLYCERIN 1 MG/10 ML FOR IR/CATH LAB
INTRA_ARTERIAL | Status: AC
  Filled 2018-10-16: qty 10

## 2018-10-16 MED ORDER — VERAPAMIL HCL 2.5 MG/ML IV SOLN
INTRAVENOUS | Status: DC | PRN
  Administered 2018-10-16: 13:00:00 10 mL via INTRA_ARTERIAL

## 2018-10-16 MED ORDER — METOPROLOL TARTRATE 25 MG PO TABS
25.0000 mg | ORAL_TABLET | Freq: Every day | ORAL | Status: DC
  Administered 2018-10-16: 25 mg via ORAL
  Filled 2018-10-16 (×2): qty 1

## 2018-10-16 MED ORDER — HEPARIN SODIUM (PORCINE) 1000 UNIT/ML IJ SOLN
INTRAMUSCULAR | Status: DC | PRN
  Administered 2018-10-16: 5000 [IU] via INTRAVENOUS
  Administered 2018-10-16 (×2): 2000 [IU] via INTRAVENOUS
  Administered 2018-10-16: 6500 [IU] via INTRAVENOUS

## 2018-10-16 MED ORDER — SODIUM CHLORIDE 0.9 % IV SOLN: 250.0000 mL | INTRAVENOUS | Status: DC | PRN

## 2018-10-16 MED ORDER — ONDANSETRON HCL 4 MG/2ML IJ SOLN
4.0000 mg | Freq: Once | INTRAMUSCULAR | Status: AC
  Administered 2018-10-16: 03:00:00 4 mg via INTRAVENOUS
  Filled 2018-10-16: qty 2

## 2018-10-16 MED ORDER — SODIUM CHLORIDE 0.9% FLUSH: 3.0000 mL | Freq: Two times a day (BID) | INTRAVENOUS | Status: DC

## 2018-10-16 MED ORDER — SODIUM CHLORIDE 0.9% FLUSH
3.0000 mL | INTRAVENOUS | Status: DC | PRN
  Administered 2018-10-19 (×2): 3 mL via INTRAVENOUS
  Filled 2018-10-16 (×2): qty 3

## 2018-10-16 MED ORDER — TICAGRELOR 90 MG PO TABS
ORAL_TABLET | ORAL | Status: DC | PRN
  Administered 2018-10-16: 180 mg via ORAL

## 2018-10-16 MED ORDER — HYDRALAZINE HCL 20 MG/ML IJ SOLN: 10.0000 mg | INTRAMUSCULAR | Status: AC | PRN

## 2018-10-16 MED ORDER — NITROGLYCERIN 1 MG/10 ML FOR IR/CATH LAB
INTRA_ARTERIAL | Status: DC | PRN
  Administered 2018-10-16 (×2): 100 ug via INTRACORONARY

## 2018-10-16 MED ORDER — VERAPAMIL HCL 2.5 MG/ML IV SOLN
INTRAVENOUS | Status: AC
  Filled 2018-10-16: qty 2

## 2018-10-16 MED ORDER — ALUM & MAG HYDROXIDE-SIMETH 200-200-20 MG/5ML PO SUSP
30.0000 mL | Freq: Once | ORAL | Status: AC
  Administered 2018-10-16: 02:00:00 30 mL via ORAL
  Filled 2018-10-16: qty 30

## 2018-10-16 MED ORDER — SODIUM CHLORIDE 0.9 % WEIGHT BASED INFUSION: 1.0000 mL/kg/h | INTRAVENOUS | Status: DC

## 2018-10-16 MED ORDER — MIDAZOLAM HCL 2 MG/2ML IJ SOLN
INTRAMUSCULAR | Status: AC
  Filled 2018-10-16: qty 2

## 2018-10-16 MED ORDER — ONDANSETRON HCL 4 MG/2ML IJ SOLN
4.0000 mg | Freq: Four times a day (QID) | INTRAMUSCULAR | Status: DC | PRN
  Administered 2018-10-16: 4 mg via INTRAVENOUS
  Filled 2018-10-16 (×2): qty 2

## 2018-10-16 MED ORDER — SODIUM CHLORIDE 0.9% FLUSH
3.0000 mL | Freq: Two times a day (BID) | INTRAVENOUS | Status: DC
  Administered 2018-10-16 – 2018-10-20 (×8): 3 mL via INTRAVENOUS

## 2018-10-16 MED ORDER — FENTANYL CITRATE (PF) 100 MCG/2ML IJ SOLN
INTRAMUSCULAR | Status: AC
  Filled 2018-10-16: qty 2

## 2018-10-16 MED ORDER — HEPARIN SODIUM (PORCINE) 1000 UNIT/ML IJ SOLN
INTRAMUSCULAR | Status: AC
  Filled 2018-10-16: qty 1

## 2018-10-16 MED ORDER — TIROFIBAN HCL IN NACL 5-0.9 MG/100ML-% IV SOLN
INTRAVENOUS | Status: AC | PRN
  Administered 2018-10-16: 0.15 ug/kg/min
  Administered 2018-10-16: 0.15 ug/kg/min via INTRAVENOUS

## 2018-10-16 MED ORDER — FENTANYL CITRATE (PF) 100 MCG/2ML IJ SOLN
INTRAMUSCULAR | Status: DC | PRN
  Administered 2018-10-16 (×2): 25 ug via INTRAVENOUS

## 2018-10-16 MED ORDER — TIROFIBAN HCL IN NACL 5-0.9 MG/100ML-% IV SOLN
INTRAVENOUS | Status: AC
  Filled 2018-10-16: qty 200

## 2018-10-16 MED ORDER — IOHEXOL 350 MG/ML SOLN
INTRAVENOUS | Status: DC | PRN
  Administered 2018-10-16: 260 mL

## 2018-10-16 MED ORDER — ASPIRIN 81 MG PO CHEW
81.0000 mg | CHEWABLE_TABLET | Freq: Every day | ORAL | Status: DC
  Administered 2018-10-16: 81 mg via ORAL
  Filled 2018-10-16: qty 1

## 2018-10-16 MED ORDER — SODIUM CHLORIDE 0.9% FLUSH: 3.0000 mL | INTRAVENOUS | Status: DC | PRN

## 2018-10-16 MED ORDER — HEPARIN (PORCINE) IN NACL 2000-0.9 UNIT/L-% IV SOLN
INTRAVENOUS | Status: AC
  Filled 2018-10-16: qty 1000

## 2018-10-16 MED ORDER — LIDOCAINE VISCOUS HCL 2 % MT SOLN
15.0000 mL | Freq: Once | OROMUCOSAL | Status: DC
  Filled 2018-10-16: qty 15

## 2018-10-16 MED ORDER — ASPIRIN 81 MG PO CHEW
81.0000 mg | CHEWABLE_TABLET | Freq: Every day | ORAL | Status: DC
  Administered 2018-10-17 – 2018-10-27 (×11): 81 mg via ORAL
  Filled 2018-10-16 (×11): qty 1

## 2018-10-16 MED ORDER — MIDAZOLAM HCL 2 MG/2ML IJ SOLN
INTRAMUSCULAR | Status: DC | PRN
  Administered 2018-10-16: 1 mg via INTRAVENOUS

## 2018-10-16 SURGICAL SUPPLY — 27 items
BALLN SAPPHIRE 2.0X12 (BALLOONS) ×2
BALLN SAPPHIRE 3.0X20 (BALLOONS) ×2
BALLN SAPPHIRE ~~LOC~~ 3.25X15 (BALLOONS) ×1 IMPLANT
BALLN SAPPHIRE ~~LOC~~ 3.5X15 (BALLOONS) ×1 IMPLANT
BALLN WOLVERINE 2.50X15 (BALLOONS) ×2
BALLN WOLVERINE 3.00X15 (BALLOONS) ×2
BALLOON SAPPHIRE 2.0X12 (BALLOONS) IMPLANT
BALLOON SAPPHIRE 3.0X20 (BALLOONS) IMPLANT
BALLOON WOLVERINE 2.50X15 (BALLOONS) IMPLANT
BALLOON WOLVERINE 3.00X15 (BALLOONS) IMPLANT
CATH INFINITI 5 FR 3DRC (CATHETERS) ×1 IMPLANT
CATH INFINITI JR4 5F (CATHETERS) ×1 IMPLANT
CATH OPTITORQUE TIG 4.0 5F (CATHETERS) ×1 IMPLANT
CATH VISTA GUIDE 6FR XBLAD3.5 (CATHETERS) ×1 IMPLANT
DEVICE RAD COMP TR BAND LRG (VASCULAR PRODUCTS) ×1 IMPLANT
ELECT DEFIB PAD ADLT CADENCE (PAD) ×1 IMPLANT
GLIDESHEATH SLEND SS 6F .021 (SHEATH) ×1 IMPLANT
GUIDEWIRE INQWIRE 1.5J.035X260 (WIRE) IMPLANT
HOVERMATT SINGLE USE (MISCELLANEOUS) ×1 IMPLANT
INQWIRE 1.5J .035X260CM (WIRE) ×2
KIT ENCORE 26 ADVANTAGE (KITS) ×1 IMPLANT
KIT HEART LEFT (KITS) ×2 IMPLANT
PACK CARDIAC CATHETERIZATION (CUSTOM PROCEDURE TRAY) ×2 IMPLANT
STENT RESOLUTE ONYX3.0X38 (Permanent Stent) ×1 IMPLANT
TRANSDUCER W/STOPCOCK (MISCELLANEOUS) ×2 IMPLANT
TUBING CIL FLEX 10 FLL-RA (TUBING) ×2 IMPLANT
WIRE PT2 MS 185 (WIRE) ×1 IMPLANT

## 2018-10-16 NOTE — Progress Notes (Signed)
ANTICOAGULATION CONSULT NOTE - Initial Consult  Pharmacy Consult for heparin Indication: chest pain/ACS  No Known Allergies  Patient Measurements: Height: 6' (182.9 cm) Weight: 291 lb 3.6 oz (132.1 kg) IBW/kg (Calculated) : 77.6 Heparin Dosing Weight: 107.9 kg  Vital Signs: Temp: 97.3 F (36.3 C) (04/16 0036) Temp Source: Oral (04/16 0036) BP: 115/76 (04/16 0115) Pulse Rate: 97 (04/16 0115)  Labs: Recent Labs    10/15/18 1722 10/15/18 2022 10/15/18 2051  HGB 14.5  --  15.1  HCT 45.5  --  46.3  PLT 281  --  291  CREATININE 1.06  --   --   TROPONINI 0.05* 1.40*  --     Estimated Creatinine Clearance: 83.4 mL/min (by C-G formula based on SCr of 1.06 mg/dL).   Medical History: Past Medical History:  Diagnosis Date  . Hypertension   . Peripheral neuropathy     Medications:  Medications Prior to Admission  Medication Sig Dispense Refill Last Dose  . aspirin EC 81 MG tablet Take 81 mg by mouth daily.   Past Month at Unknown time  . BREO ELLIPTA 100-25 MCG/INH AEPB Inhale 1 puff into the lungs daily.   10/15/2018 at Unknown time  . FLUoxetine (PROZAC) 20 MG capsule Take 60 mg by mouth every morning.   10/15/2018 at Unknown time  . hydrochlorothiazide (HYDRODIURIL) 50 MG tablet Take 50 mg by mouth every morning.    10/15/2018 at Unknown time  . lamoTRIgine (LAMICTAL) 25 MG tablet Take 75 mg by mouth at bedtime.   10/14/2018 at Unknown time  . losartan (COZAAR) 25 MG tablet Take 50 mg by mouth every morning.   10/15/2018 at Unknown time  . metFORMIN (GLUCOPHAGE) 500 MG tablet Take 250 mg by mouth every morning.    10/15/2018 at Unknown time  . omeprazole (PRILOSEC) 20 MG capsule Take 20 mg by mouth every morning.   10/15/2018 at Unknown time  . simvastatin (ZOCOR) 40 MG tablet Take 40 mg by mouth every morning.    10/15/2018 at Unknown time  . spironolactone (ALDACTONE) 25 MG tablet Take 25 mg by mouth 2 (two) times daily.    10/15/2018 at Unknown time  . tamsulosin (FLOMAX) 0.4  MG CAPS capsule Take 0.4 mg by mouth at bedtime.    10/14/2018 at Unknown time  . VENTOLIN HFA 108 (90 Base) MCG/ACT inhaler Inhale 1 puff into the lungs every 4 (four) hours as needed for wheezing.   10/15/2018 at Unknown time  . metoprolol succinate (TOPROL-XL) 50 MG 24 hr tablet Take 50 mg by mouth daily.       Assessment: 78 yo man to start heparin for CP.  He was not on anticoagulation PTA.  Hg 15.1, PTLC 291 Goal of Therapy:  Heparin level 0.3-0.7 units/ml Monitor platelets by anticoagulation protocol: Yes   Plan:  Heparin bolus 4000 units and drip at 1400 units/hr Check heparin level 6-8 hours after start Daily HL and CBC while on heparin Monitor for bleeding complications  Thanks for allowing pharmacy to be a part of this patient's care.  Talbert Cage, PharmD Clinical Pharmacist   10/16/2018,1:59 AM

## 2018-10-16 NOTE — Progress Notes (Signed)
ANTICOAGULATION CONSULT NOTE - Initial Consult  Pharmacy Consult for heparin Indication: chest pain/ACS  No Known Allergies  Patient Measurements: Height: 6' (182.9 cm) Weight: 291 lb 3.6 oz (132.1 kg) IBW/kg (Calculated) : 77.6 Heparin Dosing Weight: 107.9 kg  Vital Signs: Temp: 97.7 F (36.5 C) (04/16 0900) Temp Source: Oral (04/16 0900) BP: 120/74 (04/16 0916) Pulse Rate: 92 (04/16 0909)  Labs: Recent Labs    10/15/18 1722 10/15/18 2022 10/15/18 2051 10/16/18 0250 10/16/18 0843  HGB 14.5  --  15.1 15.1 15.3  HCT 45.5  --  46.3 46.4 46.5  PLT 281  --  291 303 289  HEPARINUNFRC  --   --   --   --  0.30  CREATININE 1.06  --   --  1.13  --   TROPONINI 0.05* 1.40*  --  12.54*  --     Estimated Creatinine Clearance: 78.2 mL/min (by C-G formula based on SCr of 1.13 mg/dL).   Medical History: Past Medical History:  Diagnosis Date  . Hypertension   . Peripheral neuropathy     Medications:  Medications Prior to Admission  Medication Sig Dispense Refill Last Dose  . aspirin EC 81 MG tablet Take 81 mg by mouth daily.   Past Month at Unknown time  . BREO ELLIPTA 100-25 MCG/INH AEPB Inhale 1 puff into the lungs daily.   10/15/2018 at Unknown time  . FLUoxetine (PROZAC) 20 MG capsule Take 60 mg by mouth every morning.   10/15/2018 at Unknown time  . hydrochlorothiazide (HYDRODIURIL) 50 MG tablet Take 50 mg by mouth every morning.    10/15/2018 at Unknown time  . lamoTRIgine (LAMICTAL) 25 MG tablet Take 75 mg by mouth at bedtime.   10/14/2018 at Unknown time  . losartan (COZAAR) 25 MG tablet Take 50 mg by mouth every morning.   10/15/2018 at Unknown time  . metFORMIN (GLUCOPHAGE) 500 MG tablet Take 250 mg by mouth every morning.    10/15/2018 at Unknown time  . omeprazole (PRILOSEC) 20 MG capsule Take 20 mg by mouth every morning.   10/15/2018 at Unknown time  . simvastatin (ZOCOR) 40 MG tablet Take 40 mg by mouth every morning.    10/15/2018 at Unknown time  . spironolactone  (ALDACTONE) 25 MG tablet Take 25 mg by mouth 2 (two) times daily.    10/15/2018 at Unknown time  . tamsulosin (FLOMAX) 0.4 MG CAPS capsule Take 0.4 mg by mouth at bedtime.    10/14/2018 at Unknown time  . VENTOLIN HFA 108 (90 Base) MCG/ACT inhaler Inhale 1 puff into the lungs every 4 (four) hours as needed for wheezing.   10/15/2018 at Unknown time  . metoprolol succinate (TOPROL-XL) 50 MG 24 hr tablet Take 50 mg by mouth daily.       Assessment: 78 yo man to start heparin for CP.  He was not on anticoagulation PTA.  CBC remains stable this AM. 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 Heparin level came back therapeutic this AM but on the lower end of goal so we will increase the rate. COVID-19 came back neg this AM.   Goal of Therapy:  Heparin level 0.3-0.7 units/ml Monitor platelets by anticoagulation protocol: Yes   Plan:  Increase heparin 1550 units/hr Confirm level this PM Daily HL and CBC while on heparin Monitor for bleeding complications  Ulyses Southward, PharmD, BCIDP, AAHIVP, CPP Infectious Disease Pharmacist 10/16/2018 9:44 AM

## 2018-10-16 NOTE — Progress Notes (Addendum)
   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 His chest pain had significantly improved from a 10 to a 3. He was still somewhat dyspneic with conversation.   Objective:  Vital signs in last 24 hours: Vitals:   10/16/18 1525 10/16/18 1644 10/16/18 1700 10/16/18 1758  BP: 106/68   107/71  Pulse: 74 76 76 77  Resp: 16 18 20  (!) 23  Temp:    97.8 F (36.6 C)  TempSrc:    Oral  SpO2: (!) 89% (!) 87% (!) 87% (!) 89%  Weight:      Height:       General: awake, alert, lying in bed in NAD CV: RRR; no murmurs, rubs or gallops Pulm: increased work of breathing. Breath sounds diminished with scattered rhonchi.  Ext: lower extremities slightly cool to tough, R>L.  Pulses only found with doppler in PT bilaterally.   Assessment/Plan:  Active Problems:   Chest pain   COPD exacerbation (HCC)   Acute anterior myocardial infarction (HCC)  1. Acute Anterior MI - greatly appreciate cardiology following - s/p LHC that revealed complete occlusion of LAD which was successfully stented - will be closely monitored in cardiac ICU  - will continue DAPT for 1 year   2. Acute HFrEF - in the setting of above  - echo shows severely reduced EF of 30% - still dyspneic on exam and requiring HFNC to maintain oxygen saturations - diuresing with IV Lasix 80 mg  - do not suspect concurrent infectious process. Findings on imaging consistent with pulmonary edema and leukocytosis can be caused by acute MI. He is otherwise afebrile without cough or upper respiratory symptoms.   3. HTN - metoprolol continued - holding losartan and spironolactone  4. HLD - on high intensity statin  5. Type II DM - managed with Metformin alone - will monitor CBGs and initiate SSI if indicated   Dispo: Anticipated discharge pending clinical improvement.   Lenward Chancellor D, DO 10/16/2018, 6:51 PM Pager:  [phone]

## 2018-10-16 NOTE — Progress Notes (Addendum)
Paged by nursing staff about acute worsening severity of pleuritic chest pain. Examined pt at bedside with senior resident present. Pt was observed writhing and yelling out in pain. Currently stating 'worst pain I ever felt in my life.' Constant pressure-like substernal pain in chest. Worse with coughing. Also noted worsening oxygenation with desaturation down to mid 80s on 3L. His [place] oxygen was increased to 5L and O2sat up to low 90s. He was also observed endorsing significant nausea and multiple episodes of NBNB vomiting.   Gen: Ill-appearing, writhing in pain. Neck: supple, ROM intact, no JVD CV: tachycardic, regular rhythm, S1, S2 normal, No rubs, no murmurs, no gallops Pulm: End-expiratory wheezing, distant breath sounds. Abd: Soft, BS+, NTND, No rebound, no guarding Extm: ROM intact, Peripheral pulses intact, No peripheral edema Skin: Dry, Warm, normal turgor, no rashes, lesions, wounds.   Pleuritic chest pain 2/2 myocarditis vs ACS Last troponin draw 1.4. Next draw due at 2am. COVID-19 negative. Influenza negative. Worsening oxygen requirement w/ low 90s at 5Ls. Already received abx treatment for pneumonia. Pain improved w/ dose of Dilaudid. Discussed case w/ Dr.Akhter who recommend avoiding NSAID use and only give heparin if no contra-indication. - Heparin bolus + drip per pharmacy - Chest X-ray stat - Troponin, VBG, CBC, CMP - Dilaudid 1mg  q2hr PRN - Zofran + GI cocktail - Will repeat COVID-19 rapid testing and continue precautions in case of false negative result.

## 2018-10-16 NOTE — Progress Notes (Signed)
CRITICAL VALUE ALERT  Critical Value:  Troponin 12.54  Date & Time Notied:  10/16/2018 4:11 AM   Provider Notified: Judeth Cornfield  Orders Received/Actions taken: orders made

## 2018-10-16 NOTE — Consult Note (Addendum)
° ° °Cardiology Consultation:  ° °Patient ID: Justin D Mani Jr.; [id number]; 10/17/1940  ° °Admit date: 10/15/2018 °Date of Consult: 10/16/2018 ° °Primary Care Provider: Ruehle, Stephen, MD °Primary Cardiologist: New ° °Patient Profile:  ° °Justin D Siegmann Jr. is a 78 y.o. male with a hx of HTN, HLD, BPH and obesity who is being seen today for the evaluation of NSTEMI at the request of Dr. Lee.  ° °History of Present Illness:  ° °Justin Bradford is a 78yo M with a hx as stated above who presented to [facility] on 10/15/2018 with acute onset of sore throat and chest pain described as a chest "pressure" rated a 4 out of 10 in severity which began while watching television around 3 PM yesterday afternoon. He reports recent increased fatigue as well as mild nausea and therefore proceeded to the emergency department for further evaluation.   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  He states that last year this time, he was working 4 to 5 days/week without problems. He lives alone and his at baseline very functional.  ° °In November 2019, he had similar symptoms of acute dyspnea while volunteering and was admitted to [facility] for further work-up. A stress test was performed which was found to be negative however, there is evidence of diffuse coronary calcifications seen on CTA this hospital admission. This was negative for PE.  He reports there was no further cardiac testing was completed at that time and he was released from the hospital. Since that time, he reports a decline in his activity level. In speaking with him today, he is very short of breath with communication and states he has almost continual chest discomfort which is worse with inspiration. He was recently seen by pulmonary medicine 09/18/2018 with a new diagnosis of COPD.  He  underwent PFTs during that time and is now on daily inhalers. ° °In the ED, he was found to have leukocytosis with a WBC count of 17.2>24.6 and was mildly hypoxic with an SPO2 of 86% on room air.  Initial troponin which was mildly elevated at 0.05.  Dr. Skains was briefly curb sided given presentation with recommendations to trend troponin given atypical pneumonia features on imaging as well as atypical chest pain. EKG noted from cardiology to have very mild ST elevation in anterior leads with recommendations to monitor closely. CTA was performed which was negative for PE however has diffuse coronary calcifications present. COVID testing was collected and was found to be negative as well. ° °Patient was transported to a cardiac telemetry unit.  Overnight, patients troponin level trended upwards at 1.40> and is now 12.54 with continued pleuritic chest discomfort. Cardiology fellow contacted overnight given significant elevation in troponin level and recommended starting IV heparin for ACS with plans for formal cardiology evaluation today.  ° °Past Medical History:  °Diagnosis Date  °• Hypertension   °• Peripheral neuropathy   ° ° °Past Surgical History:  °Procedure Laterality Date  °• KNEE SURGERY    °  ° °Prior to Admission medications   °Medication Sig Start Date End Date Taking? Authorizing Provider  °aspirin EC 81 MG tablet Take 81 mg by mouth daily.   Yes [provider]  °BREO ELLIPTA 100-25 MCG/INH AEPB Inhale 1 puff into the   lungs daily. 09/25/18  Yes [provider]  °FLUoxetine (PROZAC) 20 MG capsule Take 60 mg by mouth every morning. 01/28/17  Yes [provider]  °hydrochlorothiazide (HYDRODIURIL) 50 MG tablet Take 50 mg by mouth every morning.    Yes [provider]  °lamoTRIgine (LAMICTAL) 25 MG tablet Take 75 mg by mouth at bedtime. 01/28/17  Yes [provider]  °losartan (COZAAR) 25 MG tablet Take 50 mg by mouth every morning. 08/16/18  Yes [provider]  °metFORMIN (GLUCOPHAGE) 500 MG tablet Take 250 mg by mouth every morning.    Yes [provider]  °omeprazole (PRILOSEC) 20 MG capsule Take 20 mg by mouth every morning. 05/10/18  Yes [provider]  °simvastatin (ZOCOR) 40 MG tablet Take 40 mg by mouth every morning.    Yes [provider]  °spironolactone (ALDACTONE) 25 MG tablet Take 25 mg by mouth 2 (two) times daily.    Yes [provider]  °tamsulosin (FLOMAX) 0.4 MG CAPS capsule Take 0.4 mg by mouth at bedtime.    Yes [provider]  °VENTOLIN HFA 108 (90 Base) MCG/ACT inhaler Inhale 1 puff into the lungs every 4 (four) hours as needed for wheezing. 09/11/18  Yes [provider]  °metoprolol succinate (TOPROL-XL) 50 MG 24 hr tablet Take 50 mg by mouth daily. 05/10/18   [provider]  ° ° °Inpatient Medications: °Scheduled Meds: °• alum & mag hydroxide-simeth  30 mL Oral Once  ° And  °• lidocaine  15 mL Oral Once  °• aspirin  81 mg Oral Daily  °• atorvastatin  80 mg Oral q1800  °• FLUoxetine  60 mg Oral Daily  °• Ipratropium-Albuterol  1 puff Inhalation Q6H  °• lamoTRIgine  75 mg Oral QHS  °• metoprolol tartrate  25 mg Oral Daily  °• pantoprazole  40 mg Oral Daily  °• tamsulosin  0.4 mg Oral QHS  ° °Continuous Infusions: °• azithromycin Stopped (10/15/18 2300)  °• cefTRIAXone (ROCEPHIN)  IV Stopped (10/15/18 2156)  °• heparin 1,400 Units/hr (10/16/18 0225)  °• nitroGLYCERIN 5 mcg/min (10/16/18 0859)  ° °PRN Meds: °acetaminophen **OR** acetaminophen, morphine injection ° °Allergies:   No Known Allergies ° °Social History:   °Social History  ° °Socioeconomic History  °• Marital status: Widowed  °  Spouse name: Not on file  °• Number of children: Not on file  °• Years of education: Not on file  °• Highest education level: Not on file  °Occupational History  °• Not on file  °Social Needs  °• Financial resource strain: Not on file  °• Food insecurity:  °  Worry: Not on file  °   Inability: Not on file  °• Transportation needs:  °  Medical: Not on file  °  Non-medical: Not on file  °Tobacco Use  °• Smoking status: Former Smoker  °• Smokeless tobacco: Never Used  °Substance and Sexual Activity  °• Alcohol use: Yes  °  Comment: 2beers/day  °• Drug use: No  °• Sexual activity: Not on file  °Lifestyle  °• Physical activity:  °  Days per week: Not on file  °  Minutes per session: Not on file  °• Stress: Not on file  °Relationships  °• Social connections:  °  Talks on phone: Not on file  °  Gets together: Not on file  °  Attends religious service: Not on file  °  Active member of club or organization: Not on file  °    Attends meetings of clubs or organizations: Not on file  °  Relationship status: Not on file  °• Intimate partner violence:  °  Fear of current or ex partner: Not on file  °  Emotionally abused: Not on file  °  Physically abused: Not on file  °  Forced sexual activity: Not on file  °Other Topics Concern  °• Not on file  °Social History Narrative  °• Not on file  °  °Family History:   °History reviewed. No pertinent family history. °Family Status:  °No family status information on file.  ° ° °ROS:  °Please see the history of present illness.  °All other ROS reviewed and negative.    ° °Physical Exam/Data:  ° °Vitals:  ° 10/16/18 0109 10/16/18 0115 10/16/18 0418 10/16/18 0900  °BP: 126/63 115/76 124/82 136/89  °Pulse: 89 97  97  °Resp:  (!) 21    °Temp:    97.7 °F (36.5 °C)  °TempSrc:    Oral  °SpO2: 91% (!) 89%  (!) 89%  °Weight:      °Height:      ° ° °Intake/Output Summary (Last 24 hours) at 10/16/2018 0903 °Last data filed at 10/16/2018 0301 °Gross per 24 hour  °Intake 47.78 ml  °Output 150 ml  °Net -102.22 ml  ° °Filed Weights  ° 10/15/18 1709 10/16/18 0036  °Weight: 133.4 kg 132.1 kg  ° °Body mass index is 39.5 kg/m².  °General: The patient is in moderate respiratory distress and moderate distress related to chest pain °HEENT: normal °Lymph: no adenopathy °Neck: no JVD °Endocrine:   No thryomegaly °Vascular: No carotid bruits; 4/4 extremity pulses 2+, without bruits  °Cardiac:  normal S1, S2; RRR; no murmur  °Lungs: Diminished breath sounds in the bases, diffuse rhonchi °Abd: soft, nontender, no hepatomegaly  °Ext: no edema °Musculoskeletal:  No deformities, BUE and BLE strength normal and equal °Skin: warm and dry  °Neuro:  CNs 2-12 intact, no focal abnormalities noted °Psych:  Normal affect  ° °EKG:  The EKG was personally reviewed and demonstrates: NSR with 1st degree AV block, right bundle branch block, and mild ST segment changes in anterior leads   ° °Telemetry:  Telemetry was personally reviewed and demonstrates: Sinus rhythm without arrhythmia ° °Relevant CV Studies: ° °ECHO: Pending ° °CATH: None ° °Stress test 05/21/2018: °There is normal perfusion to the left ventricle without fixed or reversible perfusion defects on rest or stress images.  ° °No wall motion abnormalities. ° °Calculated ejection fraction is 68%.  ° °Laboratory Data: ° °Chemistry °Recent Labs  °Lab 10/15/18 °1722 10/16/18 °0250  °NA 133* 133*  °K 3.4* 3.9  °CL 97* 98  °CO2 21* 21*  °GLUCOSE 120* 171*  °BUN 15 17  °CREATININE 1.06 1.13  °CALCIUM 9.1 8.6*  °GFRNONAA >60 >60  °GFRAA >60 >60  °ANIONGAP 15 14  °  °Total Protein  °Date Value Ref Range Status  °10/16/2018 7.0 6.5 - 8.1 g/dL Final  ° °Albumin  °Date Value Ref Range Status  °10/16/2018 3.9 3.5 - 5.0 g/dL Final  ° °AST  °Date Value Ref Range Status  °10/16/2018 163 (H) 15 - 41 U/L Final  ° °ALT  °Date Value Ref Range Status  °10/16/2018 37 0 - 44 U/L Final  ° °Alkaline Phosphatase  °Date Value Ref Range Status  °10/16/2018 75 38 - 126 U/L Final  ° °Total Bilirubin  °Date Value Ref Range Status  °10/16/2018 1.2 0.3 - 1.2 mg/dL Final  ° °  Hematology °Recent Labs  °Lab 10/15/18 °1722 10/15/18 °2051 10/16/18 °0250  °WBC 16.6* 17.2* 24.6*  °RBC 4.76 4.96 5.06  °HGB 14.5 15.1 15.1  °HCT 45.5 46.3 46.4  °MCV 95.6 93.3 91.7  °[facility] 30.5 30.4 29.8  °[facility] 31.9 32.6 32.5    °RDW 13.2 13.2 13.2  °PLT 281 291 303  ° °Cardiac Enzymes °Recent Labs  °Lab 10/15/18 °1722 10/15/18 °2022 10/16/18 °0250  °TROPONINI 0.05* 1.40* 12.54*  ° No results for input(s): TROPIPOC in the last 168 hours.  °BNPNo results for input(s): BNP, PROBNP in the last 168 hours.  °DDimer No results for input(s): DDIMER in the last 168 hours. °TSH: No results found for: TSH °Lipids: °Lab Results  °Component Value Date  ° CHOL 191 10/16/2018  ° HDL 62 10/16/2018  ° LDLCALC 119 (H) 10/16/2018  ° TRIG 49 10/16/2018  ° CHOLHDL 3.1 10/16/2018  ° °HgbA1c:No results found for: HGBA1C ° °Radiology/Studies:  °Dg Chest 2 View ° °Result Date: 10/15/2018 °CLINICAL DATA:  Chest pain and sore throat EXAM: CHEST - 2 VIEW COMPARISON:  None. FINDINGS: The lungs are hyperinflated with diffuse interstitial prominence. No focal airspace consolidation or pulmonary edema. No pleural effusion or pneumothorax. Normal cardiomediastinal contours. Right basilar atelectasis. IMPRESSION: COPD without acute airspace disease. Electronically Signed   By: Kevin  Herman M.D.   On: 10/15/2018 18:21  ° °Ct Angio Chest Pe W And/or Wo Contrast ° °Result Date: 10/15/2018 °CLINICAL DATA:  Chest pain shortness of breath EXAM: CT ANGIOGRAPHY CHEST WITH CONTRAST TECHNIQUE: Multidetector CT imaging of the chest was performed using the standard protocol during bolus administration of intravenous contrast. Multiplanar CT image reconstructions and MIPs were obtained to evaluate the vascular anatomy. CONTRAST:  100mL OMNIPAQUE 350 COMPARISON:  10/15/2018 FINDINGS: Cardiovascular: Thoracic aortic calcifications are noted without aneurysmal dilatation. Diffuse coronary calcifications are seen. The pulmonary artery shows a normal branching pattern without evidence of filling defect to suggest pulmonary embolism. Mediastinum/Nodes: Thoracic inlet is within normal limits. No hilar or mediastinal adenopathy is noted. The esophagus as visualized is within normal limits.  Lungs/Pleura: Lungs are well aerated bilaterally. Mild dependent atelectatic changes are seen. Scattered ground-glass densities are noted worst in the right upper lobe. Mild bronchial wall thickening is noted particularly in the lower lobes. No sizable parenchymal nodules are seen. No pleural effusion is noted. Upper Abdomen: Visualized upper abdomen is within normal limits. Musculoskeletal: Degenerative changes of the thoracic spine are seen without acute abnormality. Review of the MIP images confirms the above findings. IMPRESSION: No evidence of pulmonary embolism. Predominately lower lobe bronchial thickening with dependent atelectatic changes. Some scattered ground-glass changes are noted predominately within the right upper lobe posteriorly. These likely represent atypical pneumonia. Aortic Atherosclerosis (ICD10-I70.0). Electronically Signed   By: Mark  Lukens M.D.   On: 10/15/2018 20:27  ° °Dg Chest Portable 1 View ° °Result Date: 10/16/2018 °CLINICAL DATA:  77 y/o M; increased shortness of breath and hypoxia. Possible COVID-19. EXAM: PORTABLE CHEST 1 VIEW COMPARISON:  10/15/2018 CT chest and chest radiograph. FINDINGS: Stable enlarged cardiac silhouette given projection and technique. Aortic calcific atherosclerosis. Patchy pulmonary opacities are increased, greater on the right. No pleural effusion or pneumothorax. Bones are unremarkable. IMPRESSION: Increased patchy pulmonary opacities, greater on the right. Electronically Signed   By: Lance  Furusawa-Stratton M.D.   On: 10/16/2018 02:45  ° °Assessment and Plan:  ° °1.  Atypical chest pain/NSTEMI: °-Patient presented with acute onset of chest pressure and sore throat that began approximately 3 PM yesterday   afternoon.  He was transported to [facility] in which EKG had mild irregularities and his troponin was found to be 0.05.  Cardiology was initially curb sided with recommendations to trend troponin level and monitor EKG closely.  Patient has evidence of atypical  pneumonia on CTA and has complaints of almost continual pleuritic chest discomfort. °-He describes recent decline in physical activity and significant exertional shortness of breath that has been worse over the past month.  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  COPD was recently diagnosed by pulmonary medicine 08/2018 °-CTA was negative for PE however there was diffuse coronary calcifications noted °-Given continual atypical chest pain as well as significant uptrend in troponin, would recommend further ischemic evaluation with cardiac catheterization °-Will need evaluation with LV function with echocardiogram as well  °-MD to follow with final recommendations °-Patient has no known history of hemorrhagic CVA and no known allergies °-Continue IV heparin/NTG gtt  °-Continue ASA 81, atorvastatin 80, metoprolol tartrate 25 ° °2.  HTN: °-Stable, 120/74, 121/76, 136/89 °-Continue metoprolol 25 °-Home losartan currently on hold as well as spironolactone ° °3.  HLD: °-LDL, 119 on 10/16/2018 °-Continue high intensity atorvastatin ° °4.  COPD: °-Recently evaluated by pulmonary medicine and is now treated for COPD with Breo and Ventolin °-Management per IM ° ° °For questions or updates, please contact [facility] °Please consult [email or web address] for contact info under Cardiology/STEMI. °  °Signed, °Jill McDaniel NP-C °[facility] °Pager: [phone] °10/16/2018 9:03 AM ° °Patient seen, examined. Available data reviewed. Agree with findings, assessment, and plan as outlined by Jill McDaniel, NP-C.  The physical exam findings documented above reflect the findings of my personal exam of  this patient today. ° °All available data is reviewed.  The patient's labs, EKG, and echocardiogram demonstrate evidence of acute anterior MI.  He has been evaluated for COVID-19 and has had 2 negative viral panels performed overnight.  The patient's CT of the chest is reviewed and demonstrates primarily lower lobe bronchial thickening and scattered groundglass changes.  He is being treated with Rocephin and azithromycin. ° °The patient has evidence of an acute anterior wall infarction with right bundle branch block and submillimeter ST elevation/injury current anteriorly.  His echocardiogram is personally reviewed and he has a large anteroapical wall motion abnormality with akinesis of this entire segment.  He appears to have severe LV systolic dysfunction with LVEF less than 30%.  The formal interpretation is currently pending.  The patient's troponin has trended up now to 15.5 mg/dL.  He is treated with heparin and IV nitroglycerin.  He continues to have severe substernal pain with a pleuritic component.  I think emergency cardiac catheterization and possible PCI are indicated. I have reviewed the risks, indications, and alternatives to cardiac catheterization, possible angioplasty, and stenting with the patient. Risks include but are not limited to bleeding, infection, vascular injury, stroke, myocardial infection, arrhythmia, kidney injury, radiation-related injury in the case of prolonged fluoroscopy use, emergency cardiac surgery, and death. The patient understands the risks of serious complication is 1-2 in 1000 with diagnostic cardiac cath and 1-2% or less with angioplasty/stenting.  ° °The patient also has evidence of   acute systolic heart failure with pulmonary edema and hypoxemia.  I think he is at high risk of respiratory complications.  He will need IV diuresis and hopefully will stabilize with medical therapy and/or revascularization.  He will clearly need to go to the cardiac ICU after emergency heart  catheterization is performed. ° °The patient is critically ill with multiple organ systems failure and requires high complexity decision making for assessment and support, frequent evaluation and titration of therapies, application of advanced monitoring technologies and extensive interpretation of multiple databases.  ° °Critical Care Time devoted to patient care services described in this note is 40 minutes. ° °Temiloluwa Recchia, M.D. °10/16/2018 °12:23 PM ° ° ° °

## 2018-10-16 NOTE — Plan of Care (Signed)
  Problem: Clinical Measurements: Goal: Ability to maintain a body temperature in the normal range will improve Outcome: Progressing   Problem: Respiratory: Goal: Ability to maintain a clear airway will improve Outcome: Progressing   Problem: Activity: Goal: Ability to tolerate increased activity will improve Outcome: Not Progressing   Problem: Respiratory: Goal: Ability to maintain adequate ventilation will improve Outcome: Not Progressing

## 2018-10-16 NOTE — Progress Notes (Signed)
Cardiology and IMTS made aware of patient's current status. Patient reports unrelieved pain after receiving Morphine 2mg  at 1033 and after nitroglycerin gtt started at 0900. Patient's O2 demand has increased from 5L to 8L with patient's O2 remaining in the  89-91%.

## 2018-10-16 NOTE — Progress Notes (Signed)
  Echocardiogram 2D Echocardiogram has been performed.  Celene Skeen 10/16/2018, 10:12 AM

## 2018-10-16 NOTE — H&P (View-Only) (Signed)
Cardiology Consultation:   Patient ID: Justin Bradford.; [id number]; 11-May-1941   Admit date: 10/15/2018 Date of Consult: 10/16/2018  Primary Care Provider: Forrest Moron, MD Primary Cardiologist: New  Patient Profile:   Justin Bradford. is a 78 y.o. male with a hx of HTN, HLD, BPH and obesity who is being seen today for the evaluation of NSTEMI at the request of Dr. Nedra Hai.   History of Present Illness:   Justin Bradford is a 78yo M with a hx as stated above who presented to [facility] on 10/15/2018 with acute onset of sore throat and chest pain described as a chest "pressure" rated a 4 out of 10 in severity which began while watching television around 3 PM yesterday afternoon. He reports recent increased fatigue as well as mild nausea and therefore proceeded to the emergency department for further evaluation.    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  He states that last year this time, he was working 4 to 5 days/week without problems. He lives alone and his at baseline very functional.   In November 2019, he had similar symptoms of acute dyspnea while volunteering and was admitted to [facility] for further work-up. A stress test was performed which was found to be negative however, there is evidence of diffuse coronary calcifications seen on CTA this hospital admission. This was negative for PE.  He reports there was no further cardiac testing was completed at that time and he was released from the hospital. Since that time, he reports a decline in his activity level. In speaking with him today, he is very short of breath with communication and states he has almost continual chest discomfort which is worse with inspiration. He was recently seen by pulmonary medicine 09/18/2018 with a new diagnosis of COPD.  He  underwent PFTs during that time and is now on daily inhalers.  In the ED, he was found to have leukocytosis with a WBC count of 17.2>24.6 and was mildly hypoxic with an SPO2 of 86% on room air.  Initial troponin which was mildly elevated at 0.05.  Dr. Anne Fu was briefly curb sided given presentation with recommendations to trend troponin given atypical pneumonia features on imaging as well as atypical chest pain. EKG noted from cardiology to have very mild ST elevation in anterior leads with recommendations to monitor closely. CTA was performed which was negative for PE however has diffuse coronary calcifications present. COVID testing was collected and was found to be negative as well.  Patient was transported to a cardiac telemetry unit.  Overnight, patients troponin level trended upwards at 1.40> and is now 12.54 with continued pleuritic chest discomfort. Cardiology fellow contacted overnight given significant elevation in troponin level and recommended starting IV heparin for ACS with plans for formal cardiology evaluation today.   Past Medical History:  Diagnosis Date   Hypertension    Peripheral neuropathy     Past Surgical History:  Procedure Laterality Date   KNEE SURGERY       Prior to Admission medications   Medication Sig Start Date End Date Taking? Authorizing Provider  aspirin EC 81 MG tablet Take 81 mg by mouth daily.   Yes [provider]  BREO ELLIPTA 100-25 MCG/INH AEPB Inhale 1 puff into the  lungs daily. 09/25/18  Yes [provider]  FLUoxetine (PROZAC) 20 MG capsule Take 60 mg by mouth every morning. 01/28/17  Yes [provider]  hydrochlorothiazide (HYDRODIURIL) 50 MG tablet Take 50 mg by mouth every morning.    Yes [provider]  lamoTRIgine (LAMICTAL) 25 MG tablet Take 75 mg by mouth at bedtime. 01/28/17  Yes [provider]  losartan (COZAAR) 25 MG tablet Take 50 mg by mouth every morning. 08/16/18  Yes [provider]  metFORMIN (GLUCOPHAGE) 500 MG tablet Take 250 mg by mouth every morning.    Yes [provider]  omeprazole (PRILOSEC) 20 MG capsule Take 20 mg by mouth every morning. 05/10/18  Yes [provider]  simvastatin (ZOCOR) 40 MG tablet Take 40 mg by mouth every morning.    Yes [provider]  spironolactone (ALDACTONE) 25 MG tablet Take 25 mg by mouth 2 (two) times daily.    Yes [provider]  tamsulosin (FLOMAX) 0.4 MG CAPS capsule Take 0.4 mg by mouth at bedtime.    Yes [provider]  VENTOLIN HFA 108 (90 Base) MCG/ACT inhaler Inhale 1 puff into the lungs every 4 (four) hours as needed for wheezing. 09/11/18  Yes [provider]  metoprolol succinate (TOPROL-XL) 50 MG 24 hr tablet Take 50 mg by mouth daily. 05/10/18   [provider]    Inpatient Medications: Scheduled Meds:  alum & mag hydroxide-simeth  30 mL Oral Once   And   lidocaine  15 mL Oral Once   aspirin  81 mg Oral Daily   atorvastatin  80 mg Oral q1800   FLUoxetine  60 mg Oral Daily   Ipratropium-Albuterol  1 puff Inhalation Q6H   lamoTRIgine  75 mg Oral QHS   metoprolol tartrate  25 mg Oral Daily   pantoprazole  40 mg Oral Daily   tamsulosin  0.4 mg Oral QHS   Continuous Infusions:  azithromycin Stopped (10/15/18 2300)   cefTRIAXone (ROCEPHIN)  IV Stopped (10/15/18 2156)   heparin 1,400 Units/hr (10/16/18 0225)   nitroGLYCERIN 5 mcg/min (10/16/18 0859)   PRN Meds: acetaminophen **OR** acetaminophen, morphine injection  Allergies:   No Known Allergies  Social History:   Social History   Socioeconomic History   Marital status: Widowed    Spouse name: Not on file   Number of children: Not on file   Years of education: Not on file   Highest education level: Not on file  Occupational History   Not on file  Social Needs   Financial resource strain: Not on file   Food insecurity:    Worry: Not on file     Inability: Not on file   Transportation needs:    Medical: Not on file    Non-medical: Not on file  Tobacco Use   Smoking status: Former Smoker   Smokeless tobacco: Never Used  Substance and Sexual Activity   Alcohol use: Yes    Comment: 2beers/day   Drug use: No   Sexual activity: Not on file  Lifestyle   Physical activity:    Days per week: Not on file    Minutes per session: Not on file   Stress: Not on file  Relationships   Social connections:    Talks on phone: Not on file    Gets together: Not on file    Attends religious service: Not on file    Active member of club or organization: Not on file  Attends meetings of clubs or organizations: Not on file    Relationship status: Not on file   Intimate partner violence:    Fear of current or ex partner: Not on file    Emotionally abused: Not on file    Physically abused: Not on file    Forced sexual activity: Not on file  Other Topics Concern   Not on file  Social History Narrative   Not on file    Family History:   History reviewed. No pertinent family history. Family Status:  No family status information on file.    ROS:  Please see the history of present illness.  All other ROS reviewed and negative.     Physical Exam/Data:   Vitals:   10/16/18 0109 10/16/18 0115 10/16/18 0418 10/16/18 0900  BP: 126/63 115/76 124/82 136/89  Pulse: 89 97  97  Resp:  (!) 21    Temp:    97.7 F (36.5 C)  TempSrc:    Oral  SpO2: 91% (!) 89%  (!) 89%  Weight:      Height:        Intake/Output Summary (Last 24 hours) at 10/16/2018 0903 Last data filed at 10/16/2018 0301 Gross per 24 hour  Intake 47.78 ml  Output 150 ml  Net -102.22 ml   Filed Weights   10/15/18 1709 10/16/18 0036  Weight: 133.4 kg 132.1 kg   Body mass index is 39.5 kg/m.  General: The patient is in moderate respiratory distress and moderate distress related to chest pain HEENT: normal Lymph: no adenopathy Neck: no JVD Endocrine:   No thryomegaly Vascular: No carotid bruits; 4/4 extremity pulses 2+, without bruits  Cardiac:  normal S1, S2; RRR; no murmur  Lungs: Diminished breath sounds in the bases, diffuse rhonchi Abd: soft, nontender, no hepatomegaly  Ext: no edema Musculoskeletal:  No deformities, BUE and BLE strength normal and equal Skin: warm and dry  Neuro:  CNs 2-12 intact, no focal abnormalities noted Psych:  Normal affect   EKG:  The EKG was personally reviewed and demonstrates: NSR with 1st degree AV block, right bundle branch block, and mild ST segment changes in anterior leads    Telemetry:  Telemetry was personally reviewed and demonstrates: Sinus rhythm without arrhythmia  Relevant CV Studies:  ECHO: Pending  CATH: None  Stress test 05/21/2018: There is normal perfusion to the left ventricle without fixed or reversible perfusion defects on rest or stress images.   No wall motion abnormalities.  Calculated ejection fraction is 68%.   Laboratory Data:  Chemistry Recent Labs  Lab 10/15/18 1722 10/16/18 0250  NA 133* 133*  K 3.4* 3.9  CL 97* 98  CO2 21* 21*  GLUCOSE 120* 171*  BUN 15 17  CREATININE 1.06 1.13  CALCIUM 9.1 8.6*  GFRNONAA >60 >60  GFRAA >60 >60  ANIONGAP 15 14    Total Protein  Date Value Ref Range Status  10/16/2018 7.0 6.5 - 8.1 g/dL Final   Albumin  Date Value Ref Range Status  10/16/2018 3.9 3.5 - 5.0 g/dL Final   AST  Date Value Ref Range Status  10/16/2018 163 (H) 15 - 41 U/L Final   ALT  Date Value Ref Range Status  10/16/2018 37 0 - 44 U/L Final   Alkaline Phosphatase  Date Value Ref Range Status  10/16/2018 75 38 - 126 U/L Final   Total Bilirubin  Date Value Ref Range Status  10/16/2018 1.2 0.3 - 1.2 mg/dL Final  Hematology Recent Labs  Lab 10/15/18 1722 10/15/18 2051 10/16/18 0250  WBC 16.6* 17.2* 24.6*  RBC 4.76 4.96 5.06  HGB 14.5 15.1 15.1  HCT 45.5 46.3 46.4  MCV 95.6 93.3 91.7  MCH 30.5 30.4 29.8  MCHC 31.9 32.6 32.5    RDW 13.2 13.2 13.2  PLT 281 291 303   Cardiac Enzymes Recent Labs  Lab 10/15/18 1722 10/15/18 2022 10/16/18 0250  TROPONINI 0.05* 1.40* 12.54*   No results for input(s): TROPIPOC in the last 168 hours.  BNPNo results for input(s): BNP, PROBNP in the last 168 hours.  DDimer No results for input(s): DDIMER in the last 168 hours. TSH: No results found for: TSH Lipids: Lab Results  Component Value Date   CHOL 191 10/16/2018   HDL 62 10/16/2018   LDLCALC 119 (H) 10/16/2018   TRIG 49 10/16/2018   CHOLHDL 3.1 10/16/2018   HgbA1c:No results found for: HGBA1C  Radiology/Studies:  Dg Chest 2 View  Result Date: 10/15/2018 CLINICAL DATA:  Chest pain and sore throat EXAM: CHEST - 2 VIEW COMPARISON:  None. FINDINGS: The lungs are hyperinflated with diffuse interstitial prominence. No focal airspace consolidation or pulmonary edema. No pleural effusion or pneumothorax. Normal cardiomediastinal contours. Right basilar atelectasis. IMPRESSION: COPD without acute airspace disease. Electronically Signed   By: Deatra RobinsonKevin  Herman M.D.   On: 10/15/2018 18:21   Ct Angio Chest Pe W And/or Wo Contrast  Result Date: 10/15/2018 CLINICAL DATA:  Chest pain shortness of breath EXAM: CT ANGIOGRAPHY CHEST WITH CONTRAST TECHNIQUE: Multidetector CT imaging of the chest was performed using the standard protocol during bolus administration of intravenous contrast. Multiplanar CT image reconstructions and MIPs were obtained to evaluate the vascular anatomy. CONTRAST:  100mL OMNIPAQUE 350 COMPARISON:  10/15/2018 FINDINGS: Cardiovascular: Thoracic aortic calcifications are noted without aneurysmal dilatation. Diffuse coronary calcifications are seen. The pulmonary artery shows a normal branching pattern without evidence of filling defect to suggest pulmonary embolism. Mediastinum/Nodes: Thoracic inlet is within normal limits. No hilar or mediastinal adenopathy is noted. The esophagus as visualized is within normal limits.  Lungs/Pleura: Lungs are well aerated bilaterally. Mild dependent atelectatic changes are seen. Scattered ground-glass densities are noted worst in the right upper lobe. Mild bronchial wall thickening is noted particularly in the lower lobes. No sizable parenchymal nodules are seen. No pleural effusion is noted. Upper Abdomen: Visualized upper abdomen is within normal limits. Musculoskeletal: Degenerative changes of the thoracic spine are seen without acute abnormality. Review of the MIP images confirms the above findings. IMPRESSION: No evidence of pulmonary embolism. Predominately lower lobe bronchial thickening with dependent atelectatic changes. Some scattered ground-glass changes are noted predominately within the right upper lobe posteriorly. These likely represent atypical pneumonia. Aortic Atherosclerosis (ICD10-I70.0). Electronically Signed   By: Alcide CleverMark  Lukens M.D.   On: 10/15/2018 20:27   Dg Chest Portable 1 View  Result Date: 10/16/2018 CLINICAL DATA:  78 y/o M; increased shortness of breath and hypoxia. Possible COVID-19. EXAM: PORTABLE CHEST 1 VIEW COMPARISON:  10/15/2018 CT chest and chest radiograph. FINDINGS: Stable enlarged cardiac silhouette given projection and technique. Aortic calcific atherosclerosis. Patchy pulmonary opacities are increased, greater on the right. No pleural effusion or pneumothorax. Bones are unremarkable. IMPRESSION: Increased patchy pulmonary opacities, greater on the right. Electronically Signed   By: Mitzi HansenLance  Furusawa-Stratton M.D.   On: 10/16/2018 02:45   Assessment and Plan:   1.  Atypical chest pain/NSTEMI: -Patient presented with acute onset of chest pressure and sore throat that began approximately 3 PM yesterday  afternoon.  He was transported to [facility] in which EKG had mild irregularities and his troponin was found to be 0.05.  Cardiology was initially curb sided with recommendations to trend troponin level and monitor EKG closely.  Patient has evidence of atypical  pneumonia on CTA and has complaints of almost continual pleuritic chest discomfort. -He describes recent decline in physical activity and significant exertional shortness of breath that has been worse over the past month.  He is a Justin Bradford, Justin Bradford at [employer] and states that he is only able to work approximately 1 hour/day 1 day/week -Was admitted to [facility] 05/2018 given similar symptoms and underwent a stress test that was found to be normal -EKG with mild ST anterior changes -Initial troponin found to be 0.05 with up trending, now up to 12.54 -CXR with COPD without acute airspace disease.  COPD was recently diagnosed by pulmonary medicine 08/2018 -CTA was negative for PE however there was diffuse coronary calcifications noted -Given continual atypical chest pain as well as significant uptrend in troponin, would recommend further ischemic evaluation with cardiac catheterization -Will need evaluation with LV function with echocardiogram as well  -MD to follow with final recommendations -Patient has no known history of hemorrhagic CVA and no known allergies -Continue IV heparin/NTG gtt  -Continue ASA 81, atorvastatin 80, metoprolol tartrate 25  2.  HTN: -Stable, 120/74, 121/76, 136/89 -Continue metoprolol 25 -Home losartan currently on hold as well as spironolactone  3.  HLD: -LDL, 119 on 10/16/2018 -Continue high intensity atorvastatin  4.  COPD: -Recently evaluated by pulmonary medicine and is now treated for COPD with Breo and Ventolin -Management per IM   For questions or updates, please contact [facility] Please consult [email or web address] for contact info under Cardiology/STEMI.   Raliegh Ip NP-C [facility] Pager: [phone] 10/16/2018 9:03 AM  Patient seen, examined. Available data reviewed. Agree with findings, assessment, and plan as outlined by Georgie Chard, NP-C.  The physical exam findings documented above reflect the findings of my personal exam of  this patient today.  All available data is reviewed.  The patient's labs, EKG, and echocardiogram demonstrate evidence of acute anterior MI.  He has been evaluated for COVID-19 and has had 2 negative viral panels performed overnight.  The patient's CT of the chest is reviewed and demonstrates primarily lower lobe bronchial thickening and scattered groundglass changes.  He is being treated with Rocephin and azithromycin.  The patient has evidence of an acute anterior wall infarction with right bundle branch block and submillimeter ST elevation/injury current anteriorly.  His echocardiogram is personally reviewed and he has a large anteroapical wall motion abnormality with akinesis of this entire segment.  He appears to have severe LV systolic dysfunction with LVEF less than 30%.  The formal interpretation is currently pending.  The patient's troponin has trended up now to 15.5 mg/dL.  He is treated with heparin and IV nitroglycerin.  He continues to have severe substernal pain with a pleuritic component.  I think emergency cardiac catheterization and possible PCI are indicated. I have reviewed the risks, indications, and alternatives to cardiac catheterization, possible angioplasty, and stenting with the patient. Risks include but are not limited to bleeding, infection, vascular injury, stroke, myocardial infection, arrhythmia, kidney injury, radiation-related injury in the case of prolonged fluoroscopy use, emergency cardiac surgery, and death. The patient understands the risks of serious complication is 1-2 in 1000 with diagnostic cardiac cath and 1-2% or less with angioplasty/stenting.   The patient also has evidence of  acute systolic heart failure with pulmonary edema and hypoxemia.  I think he is at high risk of respiratory complications.  He will need IV diuresis and hopefully will stabilize with medical therapy and/or revascularization.  He will clearly need to go to the cardiac ICU after emergency heart  catheterization is performed.  The patient is critically ill with multiple organ systems failure and requires high complexity decision making for assessment and support, frequent evaluation and titration of therapies, application of advanced monitoring technologies and extensive interpretation of multiple databases.   Critical Care Time devoted to patient care services described in this note is 40 minutes.  Tonny Bollman, M.D. 10/16/2018 12:23 PM

## 2018-10-16 NOTE — Interval H&P Note (Signed)
Cath Lab Visit (complete for each Cath Lab visit)  Clinical Evaluation Leading to the Procedure:   ACS: Yes.    Non-ACS:    Anginal Classification: CCS IV  Anti-ischemic medical therapy: Maximal Therapy (2 or more classes of medications)  Non-Invasive Test Results: No non-invasive testing performed  Prior CABG: No previous CABG      History and Physical Interval Note:  10/16/2018 12:38 PM  Sandon D Terri Skains.  has presented today for surgery, with the diagnosis of Non stemi.  The various methods of treatment have been discussed with the patient and family. After consideration of risks, benefits and other options for treatment, the patient has consented to  Procedure(s): LEFT HEART CATH AND CORONARY ANGIOGRAPHY (N/A) as a surgical intervention.  The patient's history has been reviewed, patient examined, no change in status, stable for surgery.  I have reviewed the patient's chart and labs.  Questions were answered to the patient's satisfaction.     Nicki Guadalajara

## 2018-10-17 ENCOUNTER — Inpatient Hospital Stay (HOSPITAL_COMMUNITY): Payer: Medicare Other

## 2018-10-17 ENCOUNTER — Encounter (HOSPITAL_COMMUNITY): Payer: Medicare Other

## 2018-10-17 ENCOUNTER — Encounter (HOSPITAL_COMMUNITY): Payer: Self-pay | Admitting: Cardiovascular Disease

## 2018-10-17 DIAGNOSIS — I739 Peripheral vascular disease, unspecified: Secondary | ICD-10-CM

## 2018-10-17 DIAGNOSIS — I5021 Acute systolic (congestive) heart failure: Secondary | ICD-10-CM

## 2018-10-17 DIAGNOSIS — Z95820 Peripheral vascular angioplasty status with implants and grafts: Secondary | ICD-10-CM

## 2018-10-17 LAB — CBC
HCT: 44.1 % (ref 39.0–52.0)
Hemoglobin: 14.6 g/dL (ref 13.0–17.0)
MCH: 31.1 pg (ref 26.0–34.0)
MCHC: 33.1 g/dL (ref 30.0–36.0)
MCV: 93.8 fL (ref 80.0–100.0)
Platelets: 289 10*3/uL (ref 150–400)
RBC: 4.7 MIL/uL (ref 4.22–5.81)
RDW: 13.5 % (ref 11.5–15.5)
WBC: 22.3 10*3/uL — ABNORMAL HIGH (ref 4.0–10.5)
nRBC: 0 % (ref 0.0–0.2)

## 2018-10-17 LAB — POCT ACTIVATED CLOTTING TIME
Activated Clotting Time: 279 seconds
Activated Clotting Time: 428 seconds

## 2018-10-17 LAB — BASIC METABOLIC PANEL
Anion gap: 9 (ref 5–15)
BUN: 23 mg/dL (ref 8–23)
CO2: 31 mmol/L (ref 22–32)
Calcium: 8.6 mg/dL — ABNORMAL LOW (ref 8.9–10.3)
Chloride: 96 mmol/L — ABNORMAL LOW (ref 98–111)
Creatinine, Ser: 1.44 mg/dL — ABNORMAL HIGH (ref 0.61–1.24)
GFR calc Af Amer: 54 mL/min — ABNORMAL LOW (ref >60)
GFR calc non Af Amer: 47 mL/min — ABNORMAL LOW (ref >60)
Glucose, Bld: 139 mg/dL — ABNORMAL HIGH (ref 70–99)
Potassium: 3.6 mmol/L (ref 3.5–5.1)
Sodium: 136 mmol/L (ref 135–145)

## 2018-10-17 LAB — LEGIONELLA PNEUMOPHILA SEROGP 1 UR AG: L. pneumophila Serogp 1 Ur Ag: NEGATIVE

## 2018-10-17 LAB — PROCALCITONIN: Procalcitonin: 0.38 ng/mL

## 2018-10-17 MED ORDER — FUROSEMIDE 10 MG/ML IJ SOLN
80.0000 mg | Freq: Once | INTRAMUSCULAR | Status: AC
  Administered 2018-10-17: 80 mg via INTRAVENOUS
  Filled 2018-10-17: qty 8

## 2018-10-17 MED ORDER — FLUTICASONE FUROATE-VILANTEROL 100-25 MCG/INH IN AEPB
1.0000 | INHALATION_SPRAY | Freq: Every day | RESPIRATORY_TRACT | Status: DC
  Administered 2018-10-19: 1 via RESPIRATORY_TRACT
  Filled 2018-10-17 (×2): qty 28

## 2018-10-17 MED ORDER — ALBUTEROL SULFATE (2.5 MG/3ML) 0.083% IN NEBU
2.5000 mg | INHALATION_SOLUTION | RESPIRATORY_TRACT | Status: DC | PRN
  Administered 2018-10-20 – 2018-10-22 (×3): 2.5 mg via RESPIRATORY_TRACT
  Filled 2018-10-17 (×3): qty 3

## 2018-10-17 MED ORDER — LOSARTAN POTASSIUM 25 MG PO TABS
12.5000 mg | ORAL_TABLET | Freq: Every day | ORAL | Status: DC
  Administered 2018-10-17 – 2018-10-19 (×3): 12.5 mg via ORAL
  Filled 2018-10-17 (×4): qty 1

## 2018-10-17 MED ORDER — SPIRONOLACTONE 12.5 MG HALF TABLET
12.5000 mg | ORAL_TABLET | Freq: Every day | ORAL | Status: DC
  Administered 2018-10-17 – 2018-10-27 (×10): 12.5 mg via ORAL
  Filled 2018-10-17 (×11): qty 1

## 2018-10-17 MED ORDER — CARVEDILOL 3.125 MG PO TABS
3.1250 mg | ORAL_TABLET | Freq: Two times a day (BID) | ORAL | Status: DC
  Administered 2018-10-17 – 2018-10-19 (×5): 3.125 mg via ORAL
  Filled 2018-10-17 (×5): qty 1

## 2018-10-17 MED ORDER — POTASSIUM CHLORIDE CRYS ER 20 MEQ PO TBCR
40.0000 meq | EXTENDED_RELEASE_TABLET | Freq: Two times a day (BID) | ORAL | Status: AC
  Administered 2018-10-17 (×2): 40 meq via ORAL
  Filled 2018-10-17 (×2): qty 2

## 2018-10-17 NOTE — Progress Notes (Signed)
   Subjective:  Feeling well this morning. Denies chest pain. Feels his breathing has improved. When asked about the constant movement of his legs he says he thinks that is fairly normal for him. He's also noticed a resting tremor in his hand.    Objective:  Vital signs in last 24 hours: Vitals:   10/17/18 0400 10/17/18 0430 10/17/18 0500 10/17/18 0530  BP: (!) 92/58 (!) 97/59 (!) 95/57 97/62  Pulse: 75 78 75 75  Resp: 18 17 18 17   Temp: 98.4 F (36.9 C)     TempSrc: Oral     SpO2: (!) 89% 90% 91% 91%  Weight:      Height:       Constitution: awake, alert, sitting up in bed in NAD Cardio: RRR; no m/r/g Respiratory: decreased breath sounds throughout; bibasilar crackles Abdominal: BS+; abdomen is soft, non-distended, non-tender Neuro: involuntary movement of BLE; mild cogwheel rigidity of RUE   Assessment/Plan:  Principal Problem:   Acute anterior myocardial infarction [facility]) Active Problems:   Chest pain   COPD exacerbation (HCC)   Atypical pneumonia   Elevated troponin  1. Acute anterior MI - Cath on 4/16 revealed 100% occlusion of proximal LAD; s/p DES  - continue DAPT, high intensity statin - appreciate cardiology following - lopressor switched to low Carvedilol   2. Acute HFrEF - in the setting of above - still requiring oxygen supplementation to maintain O2 sats - continue with parenteral diuretics - strict I&Os - has been started on Spironolactone and Losartan  3. AKI - crt trended up from 1.1>1.4 - multiple possible etiologies including, contrast-induced nephropathy or decreased perfusion in the setting of volume overload versus hypotension with severe MI  - will continue to monitor closely while diuresing   4. Parkinsonian features - patient has been followed by psychiatrist and psychologist for several years since his wife passed. Currently on Lamotrigine and Fluoxetine which do not typically cause tardive dyskinesia.  - no other medications that are  known for this side effect - given physical exam findings and reported history of resting tremor, would benefit from outpatient neurologic work-up for PD  5. Suspected PAD - lower extremities cool to touch with non-palpable DP pulses - obtaining ABIs  6. COPD - continue home Breo and Ventolin  7. HTN - blood pressure stable  - Continue Carvedilol, Spiro and Losartan as above   Dispo: Anticipated discharge 3-4 days pending clinical improvement.   Lenward Chancellor D, DO 10/17/2018, 6:04 AM Pager: [phone]

## 2018-10-17 NOTE — Progress Notes (Signed)
Cardiac Rehab Advisory Cardiac Rehab Phase I is not seeing pts face to face at this time due to Covid 19 restrictions. Ambulation is occurring through nursing, PT, and mobility teams. We will help facilitate that process as needed. We are calling pts in their rooms and discussing education. We will then deliver education materials to pts RN for delivery to pt.  [phone] Education on MI and CHF completed by phone and pt voiced understanding. Will give materials including low sodium diets, MI and CHF booklet, ex guidelines to RN to give to pt. Will call as follow up tomorrow to see if pt has any new questions. We discussed the importance of briinta with stent. Discussed NTG use, MI restrictions, walking for ex, CRP 2, 2000 mg sodium restriction, and importance of daily weights. Discussed when to call MD with signs of CHF since EF decreased. Pt lives alone and cooks very little but he stated that he tries to look at sodium content. Referring to [facility] CRP 2. 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 Encouraged pt to get to recliner with staff and progress to walking with them as he recovers.  Luetta Nutting RN BSN 10/17/2018 2:12 PM

## 2018-10-17 NOTE — TOC Benefit Eligibility Note (Signed)
Transition of Care [facility]) Benefit Eligibility Note    Patient Details  Name: Justin Bradford. MRN: [id number] Date of Birth: [birth year]   Medication/Dose: Marden Noble  90 MG BID(TRICAGRELOR: NON-FORMULARY)  Covered?: Yes     Prescription Coverage Preferred Pharmacy: YES([facility]  90 DAY SUPPLY FOR M/O  $117.50)  Spoke with Person/Company/Phone Number:: BRENDA([facility] # [phone]  [employer])  Co-Pay: $ 47.00  Prior Approval: No          Lollie Marrow, RN Phone Number: 10/17/2018, 11:11 AM

## 2018-10-17 NOTE — Progress Notes (Signed)
  Date: 10/17/2018  Patient name: Justin Bradford.  Medical record number: [id number]  Date of birth: 08/12/1940   I have seen and evaluated this patient and I have discussed the plan of care with the house staff. Please see Dr. Murrell Redden note for complete details. I concur with her findings and plan.   ABIs pending.  Inez Catalina, MD 10/17/2018, 12:27 PM

## 2018-10-17 NOTE — Care Management (Signed)
Bril[employer] benefits check sent and pending.  Dametra Whetsel RN, BSN, NCM-BC, ACM-RN [phone] 

## 2018-10-17 NOTE — TOC Benefit Eligibility Note (Signed)
Transition of Care [facility]) Benefit Eligibility Note    Patient Details  Name: Justin Bradford. MRN: [id number] Date of Birth: May 16, 1941   Medication/Dose: Marden Noble  90 MG BID(TRICAGRELOR: NON-FORMULARY)  Covered?: Yes     Prescription Coverage Preferred Pharmacy: YES([facility]  90 DAY SUPPLY FOR M/O  $117.50)  Spoke with Person/Company/Phone Number:: BRENDA([facility] # [id number]  [employer])  Co-Pay: $ 47.00  Prior Approval: No          Mardene Sayer Phone Number: 10/17/2018, 10:41 AM

## 2018-10-17 NOTE — Progress Notes (Signed)
Advanced Heart Failure Rounding Note   Subjective:    Underwent stenting of totally occluded LAD yesterday. Angina resolved. Chest sore from coughing. \  Breathing much better but sats still 87-99% on 3L  Received lasix 80IV last night with about 1.7L out.    Objective:   Weight Range:  Vital Signs:   Temp:  [97.7 F (36.5 C)-98.7 F (37.1 C)] 98.7 F (37.1 C) (04/17 0723) Pulse Rate:  [73-127] 77 (04/17 0700) Resp:  [10-28] 17 (04/17 0700) BP: (86-136)/(54-104) 100/60 (04/17 0700) SpO2:  [86 %-93 %] 91 % (04/17 0700) Last BM Date: 10/15/18  Weight change: Filed Weights   10/15/18 1709 10/16/18 0036  Weight: 133.4 kg 132.1 kg    Intake/Output:   Intake/Output Summary (Last 24 hours) at 10/17/2018 0829 Last data filed at 10/17/2018 0700 Gross per 24 hour  Intake 1719.8 ml  Output 1750 ml  Net -30.2 ml     Physical Exam: General:  Sitting up in bed. No resp difficulty HEENT: normal Neck: supple. JVP hard to see looks like 8-9 . Carotids 2+ bilat; no bruits. No lymphadenopathy or thryomegaly appreciated. Cor: PMI nondisplaced. Regular rate & rhythm. No rubs, gallops or murmurs. Lungs: decreased BS throughout crackles in bases   Abdomen: obese soft, nontender, nondistended. No hepatosplenomegaly. No bruits or masses. Good bowel sounds. Extremities: no cyanosis, clubbing, rash, edema Neuro: alert & orientedx3, cranial nerves grossly intact. moves all 4 extremities w/o difficulty. Affect pleasant  Telemetry: Sinus 80s Personally reviewed   Labs: Basic Metabolic Panel: Recent Labs  Lab 10/15/18 1722 10/16/18 0250 10/17/18 0255  NA 133* 133* 136  K 3.4* 3.9 3.6  CL 97* 98 96*  CO2 21* 21* 31  GLUCOSE 120* 171* 139*  BUN CREATININE 1.06 1.13 1.44*  CALCIUM 9.1 8.6* 8.6*    Liver Function Tests: Recent Labs  Lab 10/16/18 0250  AST 163*  ALT 37  ALKPHOS 75  BILITOT 1.2  PROT 7.0  ALBUMIN 3.9   No results for input(s): LIPASE,  AMYLASE in the last 168 hours. No results for input(s): AMMONIA in the last 168 hours.  CBC: Recent Labs  Lab 10/15/18 1722 10/15/18 2051 10/16/18 0250 10/16/18 0843 10/16/18 2201 10/17/18 0255  WBC 16.6* 17.2* 24.6* 21.2*  --  22.3*  NEUTROABS  --  15.0*  --   --   --   --   HGB 14.5 15.1 15.1 15.3  --  14.6  HCT 45.5 46.3 46.4 46.5  --  44.1  MCV 95.6 93.3 91.7 92.6  --  93.8  PLT 281 291 303 289 286 289    Cardiac Enzymes: Recent Labs  Lab 10/15/18 1722 10/15/18 2022 10/16/18 0250 10/16/18 0843 10/16/18 1608  TROPONINI 0.05* 1.40* 12.54* 15.50* >65.00*    BNP: BNP (last 3 results) No results for input(s): BNP in the last 8760 hours.  ProBNP (last 3 results) No results for input(s): PROBNP in the last 8760 hours.    Other results:  Imaging: Dg Chest 2 View  Result Date: 10/15/2018 CLINICAL DATA:  Chest pain and sore throat EXAM: CHEST - 2 VIEW COMPARISON:  None. FINDINGS: The lungs are hyperinflated with diffuse interstitial prominence. No focal airspace consolidation or pulmonary edema. No pleural effusion or pneumothorax. Normal cardiomediastinal contours. Right basilar atelectasis. IMPRESSION: COPD without acute airspace disease. Electronically Signed   By: Deatra Robinson M.D.   On: 10/15/2018 18:21   Ct Angio Chest Pe W And/or Wo Contrast  Result Date: 10/15/2018 CLINICAL DATA:  Chest pain shortness of breath EXAM: CT ANGIOGRAPHY CHEST WITH CONTRAST TECHNIQUE: Multidetector CT imaging of the chest was performed using the standard protocol during bolus administration of intravenous contrast. Multiplanar CT image reconstructions and MIPs were obtained to evaluate the vascular anatomy. CONTRAST:  OMNIPAQUE 350 COMPARISON:  10/15/2018 FINDINGS: Cardiovascular: Thoracic aortic calcifications are noted without aneurysmal dilatation. Diffuse coronary calcifications are seen. The pulmonary artery shows a normal branching pattern without evidence of filling defect  to suggest pulmonary embolism. Mediastinum/Nodes: Thoracic inlet is within normal limits. No hilar or mediastinal adenopathy is noted. The esophagus as visualized is within normal limits. Lungs/Pleura: Lungs are well aerated bilaterally. Mild dependent atelectatic changes are seen. Scattered ground-glass densities are noted worst in the right upper lobe. Mild bronchial wall thickening is noted particularly in the lower lobes. No sizable parenchymal nodules are seen. No pleural effusion is noted. Upper Abdomen: Visualized upper abdomen is within normal limits. Musculoskeletal: Degenerative changes of the thoracic spine are seen without acute abnormality. Review of the MIP images confirms the above findings. IMPRESSION: No evidence of pulmonary embolism. Predominately lower lobe bronchial thickening with dependent atelectatic changes. Some scattered ground-glass changes are noted predominately within the right upper lobe posteriorly. These likely represent atypical pneumonia. Aortic Atherosclerosis (ICD10-I70.0). Electronically Signed   By: Alcide Clever M.D.   On: 10/15/2018 20:27   Dg Chest Portable 1 View  Result Date: 10/16/2018 CLINICAL DATA:  78 y/o M; increased shortness of breath and hypoxia. Possible COVID-19. EXAM: PORTABLE CHEST 1 VIEW COMPARISON:  10/15/2018 CT chest and chest radiograph. FINDINGS: Stable enlarged cardiac silhouette given projection and technique. Aortic calcific atherosclerosis. Patchy pulmonary opacities are increased, greater on the right. No pleural effusion or pneumothorax. Bones are unremarkable. IMPRESSION: Increased patchy pulmonary opacities, greater on the right. Electronically Signed   By: Mitzi Hansen M.D.   On: 10/16/2018 02:45      Medications:     Scheduled Medications: . alum & mag hydroxide-simeth  30 mL Oral Once   And  . lidocaine  15 mL Oral Once  . aspirin  81 mg Oral Daily  . atorvastatin  80 mg Oral q1800  . FLUoxetine  60 mg Oral Daily   . heparin  5,000 Units Subcutaneous Q8H  . Ipratropium-Albuterol  1 puff Inhalation Q6H  . lamoTRIgine  75 mg Oral QHS  . metoprolol tartrate  25 mg Oral Daily  . pantoprazole  40 mg Oral Daily  . sodium chloride flush  3 mL Intravenous Q12H  . tamsulosin  0.4 mg Oral QHS  . ticagrelor  90 mg Oral BID     Infusions: . sodium chloride Stopped (10/17/18 0140)  . sodium chloride 10 mL/hr at 10/17/18 0700  . azithromycin Stopped (10/16/18 2321)  . cefTRIAXone (ROCEPHIN)  IV Stopped (10/16/18 2211)  . nitroGLYCERIN Stopped (10/16/18 1151)  . tirofiban 0.15 mcg/kg/min (10/17/18 0700)     PRN Medications:  sodium chloride, acetaminophen, morphine injection, ondansetron (ZOFRAN) IV, sodium chloride flush    Coronary Diagrams   Diagnostic  Dominance: Right    Intervention     Assessment:   Justin Bradford. is a 78 y.o. male with a hx of HTN, HLD, BPH and obesitywho was admitted with NSTEMI and found to have totally occluded LAD s/p DES on 4/16. Post MI care complicated by acute systolic HF with EF 30%   Plan/Discussion:     1.  CAD/NSTEMI: - peak trop > 65 -  cath 4/16 with total occlusion of LAD -> s/p DES (55% residual stenosis due to heavy calcification) - Residual CAD with LCx 60 and RCA 20%. EF 30% with LVEDP 27 - Continue DAPT, statin. Switch lopressor to low dose carvedilol as tolerated - CR to see   2. Acute systolic HF - due to LAD infarct  - Echo 10/16/18 EF 30-35% - Creatinine up but still appears volume overloaded Continue diuresis today - Switch lopressor to low dose carvedilol as tolerated - Start spiro and losartan. Switch to Entresto as BP tolerates   3. Acute hypoxic respiratory failure - sats in 87-89% range - has COPD but sats 96% on recent eval with Pulmoanry - suspect primarily HF but have to consider overlying PNA or PE as possible options. Repeat CXR in am. Ck PCT - diurese, incentive spirometer, ambulate  4.  HLD: -LDL, 119 on  10/16/2018 -Continue high intensity atorvastatin  5.  COPD: -Recently evaluated by pulmonary medicine and is now treated for COPD with Breo and Ventolin -Management per IM  6. AKI - ? Shock vs CIN vs diuresis.  - will follow closely - if continues to trend up may need PICC to measure CVP and co-ox  7. Hypokalemia - supp  Length of Stay: 2   Arvilla Meres MD 10/17/2018, 8:29 AM  Advanced Heart Failure Team Pager [phone] (M-F; 7a - 4p)  Please contact [facility] for night-coverage after hours (4p -7a ) and weekends on [email or web address]

## 2018-10-17 NOTE — Progress Notes (Signed)
Bilateral ABIs completed. Preliminary results in Chart review CV Proc Toma Deiters, RVS 10/17/2018,4;43 PM

## 2018-10-18 ENCOUNTER — Inpatient Hospital Stay (HOSPITAL_COMMUNITY): Payer: Medicare Other

## 2018-10-18 DIAGNOSIS — Z955 Presence of coronary angioplasty implant and graft: Secondary | ICD-10-CM

## 2018-10-18 LAB — BASIC METABOLIC PANEL
Anion gap: 11 (ref 5–15)
BUN: 30 mg/dL — ABNORMAL HIGH (ref 8–23)
CO2: 25 mmol/L (ref 22–32)
Calcium: 8.3 mg/dL — ABNORMAL LOW (ref 8.9–10.3)
Chloride: 96 mmol/L — ABNORMAL LOW (ref 98–111)
Creatinine, Ser: 1.44 mg/dL — ABNORMAL HIGH (ref 0.61–1.24)
GFR calc Af Amer: 54 mL/min — ABNORMAL LOW (ref >60)
GFR calc non Af Amer: 47 mL/min — ABNORMAL LOW (ref >60)
Glucose, Bld: 134 mg/dL — ABNORMAL HIGH (ref 70–99)
Potassium: 3.5 mmol/L (ref 3.5–5.1)
Sodium: 132 mmol/L — ABNORMAL LOW (ref 135–145)

## 2018-10-18 LAB — CBC
HCT: 40.8 % (ref 39.0–52.0)
Hemoglobin: 13.4 g/dL (ref 13.0–17.0)
MCH: 30.6 pg (ref 26.0–34.0)
MCHC: 32.8 g/dL (ref 30.0–36.0)
MCV: 93.2 fL (ref 80.0–100.0)
Platelets: 258 10*3/uL (ref 150–400)
RBC: 4.38 MIL/uL (ref 4.22–5.81)
RDW: 13.5 % (ref 11.5–15.5)
WBC: 17.4 10*3/uL — ABNORMAL HIGH (ref 4.0–10.5)
nRBC: 0 % (ref 0.0–0.2)

## 2018-10-18 MED ORDER — SODIUM CHLORIDE 0.9 % IV SOLN
500.0000 mg | INTRAVENOUS | Status: DC
  Administered 2018-10-18 – 2018-10-19 (×2): 500 mg via INTRAVENOUS
  Filled 2018-10-18 (×4): qty 500

## 2018-10-18 MED ORDER — SODIUM CHLORIDE 0.9 % IV SOLN
2.0000 g | INTRAVENOUS | Status: DC
  Administered 2018-10-18 – 2018-10-22 (×5): 2 g via INTRAVENOUS
  Filled 2018-10-18 (×6): qty 20

## 2018-10-18 MED ORDER — FUROSEMIDE 10 MG/ML IJ SOLN
80.0000 mg | Freq: Once | INTRAMUSCULAR | Status: AC
  Administered 2018-10-18: 80 mg via INTRAVENOUS
  Filled 2018-10-18: qty 8

## 2018-10-18 MED ORDER — POTASSIUM CHLORIDE CRYS ER 20 MEQ PO TBCR
40.0000 meq | EXTENDED_RELEASE_TABLET | Freq: Two times a day (BID) | ORAL | Status: AC
  Administered 2018-10-18 (×2): 40 meq via ORAL
  Filled 2018-10-18 (×2): qty 2

## 2018-10-18 NOTE — Progress Notes (Signed)
Preliminary results ABI 10/17/18 performed by [employer].  Right: Resting right ankle-brachial index is within normal range. No evidence of significant right lower extremity arterial disease.   Left: Resting left ankle-brachial index indicates mild left lower extremity arterial disease.   Jeb Levering, BS, RDMS, RVT

## 2018-10-18 NOTE — Progress Notes (Signed)
Cardiac Rehab Advisory Cardiac Rehab Phase I is not seeing pts face to face at this time due to Covid 19 restrictions. Ambulation is occurring through nursing, PT, and mobility teams. We will help facilitate that process as needed. We are calling pts in their rooms and discussing education. We will then deliver education materials to pts RN for delivery to pt.  1025-1030 Called pt to see if any questions re ed done yesterday. Pt stated has not felt up to looking at materials. Sounded more SOB on phone today than yesterday. Discussed with pt that I will check back by phone on Monday to see how he is doing. Pt knows he is to begin walking slowly with staff when he is stronger.. Stated just getting to recliner tired him out yesterday. Luetta Nutting RN BSN 10/18/2018 10:33 AM

## 2018-10-18 NOTE — Progress Notes (Signed)
  Date: 10/18/2018  Patient name: Justin Bradford.  Medical record number: [id number]  Date of birth: 1941/06/06   I have seen and evaluated this patient and I have discussed the plan of care with the house staff. Please see Dr. Murrell Redden note for complete details. I concur with her findings and plan.   Will treat with Abx for 5 days total.  Likely viral or related to acute heart failure, but given significant elevated WBC and persistent hypoxia, bacterial pneumonia cannot be ruled out completely.  Reviewed HF recommendations.  Will remain in CCU for 1 more day.   Inez Catalina, MD 10/18/2018, 12:21 PM

## 2018-10-18 NOTE — Progress Notes (Addendum)
   Subjective:  He is having trouble sleeping in the hospital because the bed hurts his neck, but he has been breathing well and denies shortness of breath or chest pain. He moved from bed to chair yesterday, which went well, but he said this wore him out a lot. He wanted to walk but was too tired. He endorses frequent cough but states this is chronic for him and does not seem increased from usual.   Objective:  Vital signs in last 24 hours: Vitals:   10/18/18 0300 10/18/18 0400 10/18/18 0500 10/18/18 0600  BP: 102/71 103/73 100/68 106/68  Pulse: 77 77 73 73  Resp: 18 17 (!) 21 (!) 9  Temp: 98.3 F (36.8 C)     TempSrc: Oral     SpO2: 95% 94% 92% 91%  Weight:      Height:       Constitution: awake, alert, resting in bed in NAD HENT: Eyes: Cardio: RRR; no m/r/g Respiratory: dyspneic with conversation. Right sided crackles. No wheezing  Abdominal: BS+; abdomen is soft, non-distended, non-tender Ext: no lower extremity edema   Assessment/Plan:  Principal Problem:   Acute anterior myocardial infarction [facility]) Active Problems:   Chest pain   COPD exacerbation (HCC)   Atypical pneumonia   Elevated troponin   Acute systolic heart failure (HCC)  1. Acute anterior MI - Cath on 4/16 revealed 100% occlusion of proximal LAD; s/p DES  - continue DAPT, high intensity statin, beta-blocker - appreciate cardiology following  2. Acute HFrEF - in the setting of above; echo on 4/16 with EF of 30-35% - still requiring oxygen supplementation to maintain O2 sats - net negative 400 ml since yesterday; continue diuresing today given worsening CXR - strict I&Os - creatinine stable  - continue Coreg, Spironolactone and Losartan  3. Acute hypoxic respiratory failure - currently on 6L HFNC to maintain O2 sats - most likely related acute HFrEF in the setting of LAD infarct - he is afebrile and reports his chronic cough has not worsened recently. Leukocytosis has trended down from 22>17.  Procal normal. COVID and RVP negative. Given worsening CXR, however, will restart antibiotics to complete a 5 day course - continue incentive spirometer and ambulate as tolerated   4. AKI - crt trended up from 1.1>1.4; remains stable today  - multiple possible etiologies including, contrast-induced nephropathy or decreased perfusion in the setting of volume overload versus hypotension with severe MI  - will continue to monitor closely while diuresing   5. Parkinsonian features - patient has been followed by psychiatrist and psychologist for several years since his wife passed. Currently on Lamotrigine and Fluoxetine which do not typically cause tardive dyskinesia.  - no other medications that are known for this side effect - given physical exam findings and reported history of resting tremor, would benefit from outpatient neurologic work-up for PD  6. Suspected PAD - lower extremities cool to touch with non-palpable DP pulses - right lower extremity was normal; left revealed mild arterial disease   7. COPD - continue home Breo and Ventolin  8. HTN - blood pressure stable  - Continue Carvedilol, Spiro and Losartan as above  Dispo: Anticipated discharge in approximately 4-5 day(s) pending improvement in respiratory status.   Lenward Chancellor D, DO 10/18/2018, 6:20 AM Pager: [phone]

## 2018-10-18 NOTE — Progress Notes (Addendum)
Advanced Heart Failure Rounding Note   Subjective:    Feels better. Less dyspneic but sats still drop to 86% when he talks. No further angina. Chest a bit sore from coughing   CXR with worsening airspace disease on the right. PCT 0.38. Flu negative. CoV-19 negative  I/o mildly negative. No weights.    Objective:   Weight Range:  Vital Signs:   Temp:  [97.9 F (36.6 C)-98.3 F (36.8 C)] 98.2 F (36.8 C) (04/18 0726) Pulse Rate:  [73-95] 73 (04/18 0700) Resp:  [9-27] 19 (04/18 0700) BP: (92-123)/(59-100) 103/71 (04/18 0700) SpO2:  [89 %-97 %] 93 % (04/18 0700) Last BM Date: 10/15/18  Weight change: Filed Weights   10/15/18 1709 10/16/18 0036  Weight: 133.4 kg 132.1 kg    Intake/Output:   Intake/Output Summary (Last 24 hours) at 10/18/2018 0811 Last data filed at 10/18/2018 0225 Gross per 24 hour  Intake 1277.2 ml  Output 1675 ml  Net -397.8 ml     Physical Exam: General:  Elderly mildly dyspneic with talking  HEENT: normal Neck: supple. no JVD. Carotids 2+ bilat; no bruits. No lymphadenopathy or thryomegaly appreciated. Cor: PMI nondisplaced. Regular rate & rhythm. No rubs, gallops or murmurs. Lungs: crackles on R Abdomen: soft, nontender, nondistended. No hepatosplenomegaly. No bruits or masses. Good bowel sounds. Extremities: no cyanosis, clubbing, rash, edema Neuro: alert & orientedx3, cranial nerves grossly intact. moves all 4 extremities w/o difficulty. Affect pleasant   Telemetry: Sinus 70- 80s Personally reviewed   Labs: Basic Metabolic Panel: Recent Labs  Lab 10/15/18 1722 10/16/18 0250 10/17/18 0255 10/18/18 0259  NA 133* 133* 136 132*  K 3.4* 3.9 3.6 3.5  CL 97* 98 96* 96*  CO2 21* 21* 31 25  GLUCOSE 120* 171* 139* 134*  BUN 15 17 23  30*  CREATININE 1.06 1.13 1.44* 1.44*  CALCIUM 9.1 8.6* 8.6* 8.3*    Liver Function Tests: Recent Labs  Lab 10/16/18 0250  AST 163*  ALT 37  ALKPHOS 75  BILITOT 1.2  PROT 7.0  ALBUMIN 3.9    No results for input(s): LIPASE, AMYLASE in the last 168 hours. No results for input(s): AMMONIA in the last 168 hours.  CBC: Recent Labs  Lab 10/15/18 2051 10/16/18 0250 10/16/18 0843 10/16/18 2201 10/17/18 0255 10/18/18 0259  WBC 17.2* 24.6* 21.2*  --  22.3* 17.4*  NEUTROABS 15.0*  --   --   --   --   --   HGB 15.1 15.1 15.3  --  14.6 13.4  HCT 46.3 46.4 46.5  --  44.1 40.8  MCV 93.3 91.7 92.6  --  93.8 93.2  PLT 291 303 289 286 289 258    Cardiac Enzymes: Recent Labs  Lab 10/15/18 1722 10/15/18 2022 10/16/18 0250 10/16/18 0843 10/16/18 1608  TROPONINI 0.05* 1.40* 12.54* 15.50* >65.00*    BNP: BNP (last 3 results) No results for input(s): BNP in the last 8760 hours.  ProBNP (last 3 results) No results for input(s): PROBNP in the last 8760 hours.    Other results:  Imaging: No results found.   Medications:     Scheduled Medications: . alum & mag hydroxide-simeth  30 mL Oral Once   And  . lidocaine  15 mL Oral Once  . aspirin  81 mg Oral Daily  . atorvastatin  80 mg Oral q1800  . carvedilol  3.125 mg Oral BID WC  . FLUoxetine  60 mg Oral Daily  . fluticasone furoate-vilanterol  1  puff Inhalation Daily  . heparin  5,000 Units Subcutaneous Q8H  . lamoTRIgine  75 mg Oral QHS  . losartan  12.5 mg Oral Daily  . pantoprazole  40 mg Oral Daily  . sodium chloride flush  3 mL Intravenous Q12H  . spironolactone  12.5 mg Oral Daily  . tamsulosin  0.4 mg Oral QHS  . ticagrelor  90 mg Oral BID    Infusions: . sodium chloride Stopped (10/17/18 0140)  . sodium chloride 10 mL/hr at 10/17/18 0700  . azithromycin    . cefTRIAXone (ROCEPHIN)  IV    . nitroGLYCERIN Stopped (10/16/18 1151)    PRN Medications: sodium chloride, acetaminophen, albuterol, morphine injection, ondansetron (ZOFRAN) IV, sodium chloride flush    Coronary Diagrams   Diagnostic  Dominance: Right    Intervention     Assessment:   Justin Bradford. is a 78 y.o. male with a  hx of HTN, HLD, BPH and obesitywho was admitted with NSTEMI and found to have totally occluded LAD s/p DES on 4/16. Post MI care complicated by acute systolic HF with EF 30%   Plan/Discussion:     1.  CAD/NSTEMI: - peak trop > 65 - cath 4/16 with total occlusion of LAD -> s/p DES (55% residual stenosis due to heavy calcification) - Residual CAD with LCx 60 and RCA 20%. EF 30% with LVEDP 27 - Continue DAPT, statin and b-blocker - Ambulate carefully   2. 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 Switch to Entresto as BP tolerates  - Volume status looks good on exam but given CXR findings will give one more dose IV lasix   3. Acute hypoxic respiratory failure - sats in 87-89% range - has COPD but sats 96% on recent eval with Pulmoanry - suspect primarily HF but have to consider overlying PNA or PE as possible options. - CXR worse today despite diuresis.  - PCT 0.38. WBC 22k-> 17.4 Flu negative. CoV-19 negative x 2. Viral panel negative - Would continue abx. Check sputum cx.  - I reviewed imaging and case with Pulmonary. Suspect viral pneumonitis or capillary leak. Continue conservative management  - diurese, incentive spirometer, ambulate  4.  HLD: -LDL, 119 on 10/16/2018 -Continue high intensity atorvastatin  5.  COPD: -Recently evaluated by pulmonary medicine and is now treated for COPD with Breo and Ventolin -Management per IM  6. AKI - ? Shock vs CIN vs diuresis.  - Creatinine stable at 1.44 today - will follow closely  7. Hypokalemia - supp  Keep in ICU one more day  Length of Stay: 3   Arvilla Meres MD 10/18/2018, 8:11 AM  Advanced Heart Failure Team Pager [phone] (M-F; 7a - 4p)  Please contact [facility] for night-coverage after hours (4p -7a ) and weekends on [email or web address]

## 2018-10-19 ENCOUNTER — Inpatient Hospital Stay: Payer: Self-pay

## 2018-10-19 ENCOUNTER — Inpatient Hospital Stay (HOSPITAL_COMMUNITY): Payer: Medicare Other

## 2018-10-19 DIAGNOSIS — Z7289 Other problems related to lifestyle: Secondary | ICD-10-CM

## 2018-10-19 DIAGNOSIS — I5021 Acute systolic (congestive) heart failure: Secondary | ICD-10-CM

## 2018-10-19 DIAGNOSIS — I110 Hypertensive heart disease with heart failure: Secondary | ICD-10-CM

## 2018-10-19 DIAGNOSIS — R7989 Other specified abnormal findings of blood chemistry: Secondary | ICD-10-CM

## 2018-10-19 DIAGNOSIS — J9601 Acute respiratory failure with hypoxia: Secondary | ICD-10-CM

## 2018-10-19 DIAGNOSIS — J441 Chronic obstructive pulmonary disease with (acute) exacerbation: Secondary | ICD-10-CM

## 2018-10-19 DIAGNOSIS — Z95828 Presence of other vascular implants and grafts: Secondary | ICD-10-CM

## 2018-10-19 DIAGNOSIS — Z87891 Personal history of nicotine dependence: Secondary | ICD-10-CM

## 2018-10-19 DIAGNOSIS — J449 Chronic obstructive pulmonary disease, unspecified: Secondary | ICD-10-CM

## 2018-10-19 DIAGNOSIS — Z79899 Other long term (current) drug therapy: Secondary | ICD-10-CM

## 2018-10-19 DIAGNOSIS — Z7951 Long term (current) use of inhaled steroids: Secondary | ICD-10-CM

## 2018-10-19 DIAGNOSIS — I4891 Unspecified atrial fibrillation: Secondary | ICD-10-CM

## 2018-10-19 DIAGNOSIS — N179 Acute kidney failure, unspecified: Secondary | ICD-10-CM

## 2018-10-19 DIAGNOSIS — Z955 Presence of coronary angioplasty implant and graft: Secondary | ICD-10-CM

## 2018-10-19 LAB — COOXEMETRY PANEL
Carboxyhemoglobin: 1.3 % (ref 0.5–1.5)
Methemoglobin: 1.5 % (ref 0.0–1.5)
O2 Saturation: 62.3 %
Total hemoglobin: 13.1 g/dL (ref 12.0–16.0)

## 2018-10-19 LAB — CBC
HCT: 38.6 % — ABNORMAL LOW (ref 39.0–52.0)
Hemoglobin: 12.1 g/dL — ABNORMAL LOW (ref 13.0–17.0)
MCH: 29.4 pg (ref 26.0–34.0)
MCHC: 31.3 g/dL (ref 30.0–36.0)
MCV: 93.9 fL (ref 80.0–100.0)
Platelets: 267 10*3/uL (ref 150–400)
RBC: 4.11 MIL/uL — ABNORMAL LOW (ref 4.22–5.81)
RDW: 13.4 % (ref 11.5–15.5)
WBC: 15.6 10*3/uL — ABNORMAL HIGH (ref 4.0–10.5)
nRBC: 0 % (ref 0.0–0.2)

## 2018-10-19 LAB — BASIC METABOLIC PANEL
Anion gap: 10 (ref 5–15)
BUN: 29 mg/dL — ABNORMAL HIGH (ref 8–23)
CO2: 25 mmol/L (ref 22–32)
Calcium: 8.3 mg/dL — ABNORMAL LOW (ref 8.9–10.3)
Chloride: 98 mmol/L (ref 98–111)
Creatinine, Ser: 1.44 mg/dL — ABNORMAL HIGH (ref 0.61–1.24)
GFR calc Af Amer: 54 mL/min — ABNORMAL LOW (ref >60)
GFR calc non Af Amer: 47 mL/min — ABNORMAL LOW (ref >60)
Glucose, Bld: 132 mg/dL — ABNORMAL HIGH (ref 70–99)
Potassium: 3.7 mmol/L (ref 3.5–5.1)
Sodium: 133 mmol/L — ABNORMAL LOW (ref 135–145)

## 2018-10-19 LAB — MAGNESIUM: Magnesium: 2.3 mg/dL (ref 1.7–2.4)

## 2018-10-19 LAB — HEPARIN LEVEL (UNFRACTIONATED): Heparin Unfractionated: 0.14 IU/mL — ABNORMAL LOW (ref 0.30–0.70)

## 2018-10-19 MED ORDER — NOREPINEPHRINE 4 MG/250ML-% IV SOLN
INTRAVENOUS | Status: AC
  Administered 2018-10-19: 12:00:00 4 ug/min via INTRAVENOUS
  Filled 2018-10-19: qty 250

## 2018-10-19 MED ORDER — AMIODARONE LOAD VIA INFUSION
150.0000 mg | Freq: Once | INTRAVENOUS | Status: AC
  Administered 2018-10-19: 150 mg via INTRAVENOUS
  Filled 2018-10-19: qty 83.34

## 2018-10-19 MED ORDER — AMIODARONE HCL IN DEXTROSE 360-4.14 MG/200ML-% IV SOLN
30.0000 mg/h | INTRAVENOUS | Status: DC
  Administered 2018-10-19 – 2018-10-21 (×4): 30 mg/h via INTRAVENOUS
  Filled 2018-10-19 (×4): qty 200

## 2018-10-19 MED ORDER — METHYLPREDNISOLONE SODIUM SUCC 125 MG IJ SOLR
80.0000 mg | Freq: Once | INTRAMUSCULAR | Status: AC
  Administered 2018-10-19: 10:00:00 80 mg via INTRAVENOUS

## 2018-10-19 MED ORDER — ARFORMOTEROL TARTRATE 15 MCG/2ML IN NEBU
15.0000 ug | INHALATION_SOLUTION | Freq: Two times a day (BID) | RESPIRATORY_TRACT | Status: DC
  Administered 2018-10-19 – 2018-10-25 (×13): 15 ug via RESPIRATORY_TRACT
  Filled 2018-10-19 (×13): qty 2

## 2018-10-19 MED ORDER — SODIUM CHLORIDE 0.9% FLUSH: 10.0000 mL | INTRAVENOUS | Status: DC | PRN

## 2018-10-19 MED ORDER — CHLORHEXIDINE GLUCONATE CLOTH 2 % EX PADS
6.0000 | MEDICATED_PAD | Freq: Every day | CUTANEOUS | Status: DC
  Administered 2018-10-19 – 2018-10-22 (×4): 6 via TOPICAL

## 2018-10-19 MED ORDER — POTASSIUM CHLORIDE CRYS ER 20 MEQ PO TBCR
40.0000 meq | EXTENDED_RELEASE_TABLET | Freq: Once | ORAL | Status: AC
  Administered 2018-10-19: 40 meq via ORAL
  Filled 2018-10-19: qty 2

## 2018-10-19 MED ORDER — AMIODARONE HCL IN DEXTROSE 360-4.14 MG/200ML-% IV SOLN
INTRAVENOUS | Status: AC
  Filled 2018-10-19: qty 200

## 2018-10-19 MED ORDER — HEPARIN (PORCINE) 25000 UT/250ML-% IV SOLN
1850.0000 [IU]/h | INTRAVENOUS | Status: DC
  Administered 2018-10-19: 12:00:00 1500 [IU]/h via INTRAVENOUS
  Administered 2018-10-20: 03:00:00 1850 [IU]/h via INTRAVENOUS
  Filled 2018-10-19 (×2): qty 250

## 2018-10-19 MED ORDER — NOREPINEPHRINE 4 MG/250ML-% IV SOLN
0.0000 ug/min | INTRAVENOUS | Status: DC
  Administered 2018-10-19: 12:00:00 4 ug/min via INTRAVENOUS
  Administered 2018-10-19 – 2018-10-20 (×3): 14 ug/min via INTRAVENOUS
  Filled 2018-10-19 (×4): qty 250

## 2018-10-19 MED ORDER — METOLAZONE 5 MG PO TABS
5.0000 mg | ORAL_TABLET | Freq: Once | ORAL | Status: AC
  Administered 2018-10-19: 14:00:00 5 mg via ORAL
  Filled 2018-10-19: qty 1

## 2018-10-19 MED ORDER — SODIUM CHLORIDE 0.9% FLUSH
10.0000 mL | Freq: Two times a day (BID) | INTRAVENOUS | Status: DC
  Administered 2018-10-19 – 2018-10-23 (×9): 10 mL
  Administered 2018-10-24: 20 mL
  Administered 2018-10-26: 10 mL
  Administered 2018-10-26: 20 mL

## 2018-10-19 MED ORDER — AMIODARONE HCL IN DEXTROSE 360-4.14 MG/200ML-% IV SOLN
60.0000 mg/h | INTRAVENOUS | Status: AC
  Administered 2018-10-19 (×2): 60 mg/h via INTRAVENOUS
  Filled 2018-10-19: qty 200

## 2018-10-19 MED ORDER — PREDNISONE 20 MG PO TABS: 40.0000 mg | ORAL_TABLET | Freq: Every day | ORAL | Status: DC

## 2018-10-19 MED ORDER — FUROSEMIDE 10 MG/ML IJ SOLN
80.0000 mg | Freq: Once | INTRAMUSCULAR | Status: AC
  Administered 2018-10-19: 80 mg via INTRAVENOUS
  Filled 2018-10-19: qty 8

## 2018-10-19 MED ORDER — BUDESONIDE 0.25 MG/2ML IN SUSP
0.2500 mg | Freq: Two times a day (BID) | RESPIRATORY_TRACT | Status: DC
  Administered 2018-10-19 – 2018-10-25 (×13): 0.25 mg via RESPIRATORY_TRACT
  Filled 2018-10-19 (×13): qty 2

## 2018-10-19 MED ORDER — METHYLPREDNISOLONE SODIUM SUCC 125 MG IJ SOLR
INTRAMUSCULAR | Status: AC
  Filled 2018-10-19: qty 2

## 2018-10-19 NOTE — Progress Notes (Signed)
Subjective:  Says that when he talks he is getting short of breath but when he rests he feels better. Yesterday he moved to the chair and had a bath and bowel movement. He is still coughing. He has no acute concerns this morning and has been eating and drinking well.   Objective:  Vital signs in last 24 hours: Vitals:   10/19/18 0200 10/19/18 0300 10/19/18 0400 10/19/18 0500  BP: 93/81 102/66 98/66 98/64   Pulse: 78 84 70 73  Resp: 15 (!) 22 (!) 21 (!) 21  Temp:  98.2 F (36.8 C) 98.2 F (36.8 C)   TempSrc:  Oral Oral   SpO2: 95% 93% 93% 92%  Weight:      Height:       General: awake, alert, lying in bed in NAD Cardio: RRR; no m/r/g Respiratory: dyspneic with conversation, saturating in low 90s with HFNC at 5L. Lungs CTA without w/r/r Abdominal: BS+; abdomen is soft, non-tender, non-distended  Ext: no edema   Assessment/Plan:  Principal Problem:   Acute anterior myocardial infarction [facility]) Active Problems:   Chest pain   COPD exacerbation (HCC)   Atypical pneumonia   Elevated troponin   Acute systolic heart failure (HCC)   Status post coronary artery stent placement  1. Acute anterior MI - Cath on 4/16 revealed 100% occlusion of proximal LAD; s/p DES  - continue DAPT, high intensity statin, beta-blocker - appreciate cardiology following  2. Acute HFrEF - in the setting of above; echo on 4/16 with EF of 30-35% - still requiring oxygen supplementation to maintain O2 sats - appears euvolemic on exam; holding further diuresis - strict I&Os - creatinine stable  - continue Coreg, Spironolactone and Losartan   3. Acute hypoxic respiratory failure - currently on 5-6L HFNC to maintain O2 sats - most likely related acute HFrEF in the setting of LAD infarct - he is afebrile and reports his chronic cough has not worsened recently.  Procal normal. COVID and RVP negative. Given worsening CXR, he was restarted on Abx for CAP coverage. Leukocytosis has trended down from  22>17>15.6. May also be viral pneumonitis. Trying dose of steroids today - continue incentive spirometer and ambulate as tolerated  - remaining in ICU for closer monitoring   4. New onset afib with RVR - later this morning, patient went into afib with RVR after being in sinus since admission - initiated on IV amio and heparin  5. AKI - crt trended up from 1.1>1.4; remains stable today  - may be in the setting of decreased perfusion in the setting of volume overload versus hypotension  - will continue to monitor closely while diuresing   6. Parkinsonian features - patient has been followed by psychiatrist and psychologist for several years since his wife passed. Currently on Lamotrigine and Fluoxetine which do not typically cause tardive dyskinesia.  - no other medications that are known for this side effect - given physical exam findings and reported history of resting tremor, would benefit from outpatient neurologic work-up for PD  7. Suspected PAD - lower extremities cool to touch with non-palpable DP pulses - right lower extremity was normal; left revealed mild arterial disease   8. COPD - continue home Breo and Ventolin  9. HTN - patient became hypotensive this morning; being started on levophed; likely in the setting of new onset afib with RVR; being managed as above   Dispo: Patient will remain in ICU to continue monitoring respiratory status.   [address], [place] D,  DO 10/19/2018, 6:09 AM Pager: [phone]

## 2018-10-19 NOTE — Progress Notes (Signed)
ANTICOAGULATION CONSULT NOTE - Follow-Up Consult  Pharmacy Consult for Heparin Indication: atrial fibrillation  No Known Allergies  Patient Measurements: Height: 6' (182.9 cm) Weight: 291 lb 3.6 oz (132.1 kg) IBW/kg (Calculated) : 77.6 HEPARIN DW (KG): 107.9   Vital Signs: Temp: 97.8 F (36.6 C) (04/19 2000) Temp Source: Oral (04/19 2000) BP: 113/57 (04/19 2030) Pulse Rate: 74 (04/19 2030)  Labs: Recent Labs    10/17/18 0255 10/18/18 0259 10/19/18 0321 10/19/18 2034  HGB 14.6 13.4 12.1*  --   HCT 44.1 40.8 38.6*  --   PLT 289 258 267  --   HEPARINUNFRC  --   --   --  0.14*  CREATININE 1.44* 1.44* 1.44*  --     Estimated Creatinine Clearance: 61.4 mL/min (A) (by C-G formula based on SCr of 1.44 mg/dL (H)).   Medical History: Past Medical History:  Diagnosis Date  . Hypertension   . Peripheral neuropathy     Medications:  Scheduled:  . alum & mag hydroxide-simeth  30 mL Oral Once   And  . lidocaine  15 mL Oral Once  . arformoterol  15 mcg Nebulization BID  . aspirin  81 mg Oral Daily  . atorvastatin  80 mg Oral q1800  . budesonide (PULMICORT) nebulizer solution  0.25 mg Nebulization BID  . carvedilol  3.125 mg Oral BID WC  . Chlorhexidine Gluconate Cloth  6 each Topical Daily  . FLUoxetine  60 mg Oral Daily  . lamoTRIgine  75 mg Oral QHS  . losartan  12.5 mg Oral Daily  . pantoprazole  40 mg Oral Daily  . [START ON 10/20/2018] predniSONE  40 mg Oral Q breakfast  . sodium chloride flush  10-40 mL Intracatheter Q12H  . sodium chloride flush  3 mL Intravenous Q12H  . spironolactone  12.5 mg Oral Daily  . tamsulosin  0.4 mg Oral QHS  . ticagrelor  90 mg Oral BID   Infusions:  . sodium chloride Stopped (10/17/18 0140)  . sodium chloride Stopped (10/19/18 1353)  . amiodarone 30 mg/hr (10/19/18 2000)  . azithromycin Stopped (10/19/18 1345)  . cefTRIAXone (ROCEPHIN)  IV Stopped (10/19/18 4462)  . heparin 1,500 Units/hr (10/19/18 2000)  . nitroGLYCERIN  Stopped (10/16/18 1151)  . norepinephrine (LEVOPHED) Adult infusion 14 mcg/min (10/19/18 2000)    Assessment: Pt is a 77yoM with new onset afib with RVR. Pharmacy is consulted to start heparin drip. CBC relatively stable.  Initial heparin level subtherapeutic at 0.14, pt converted to NSR on amio gtt.  Goal of Therapy:  Heparin level 0.3-0.7 units/ml Monitor platelets by anticoagulation protocol: Yes   Plan:  -Increase heparin infusion to 1850 units/hr -Recheck heparin level in 8 hr   Fredonia Highland, PharmD, BCPS Clinical Pharmacist [phone] Please check AMION for all [facility] numbers 10/19/2018

## 2018-10-19 NOTE — Progress Notes (Signed)
ANTICOAGULATION CONSULT NOTE - Initial Consult  Pharmacy Consult for Heparin Indication: atrial fibrillation  No Known Allergies  Patient Measurements: Height: 6' (182.9 cm) Weight: 291 lb 3.6 oz (132.1 kg) IBW/kg (Calculated) : 77.6 HEPARIN DW (KG): 107.9   Vital Signs: Temp: 98.2 F (36.8 C) (04/19 0400) Temp Source: Oral (04/19 0400) BP: 73/57 (04/19 0800) Pulse Rate: 74 (04/19 0800)  Labs: Recent Labs    10/16/18 1608  10/17/18 0255 10/18/18 0259 10/19/18 0321  HGB  --    < > 14.6 13.4 12.1*  HCT  --   --  44.1 40.8 38.6*  PLT  --    < > 289 258 267  CREATININE  --   --  1.44* 1.44* 1.44*  TROPONINI >65.00*  --   --   --   --    < > = values in this interval not displayed.    Estimated Creatinine Clearance: 61.4 mL/min (A) (by C-G formula based on SCr of 1.44 mg/dL (H)).   Medical History: Past Medical History:  Diagnosis Date  . Hypertension   . Peripheral neuropathy     Medications:  Scheduled:  . alum & mag hydroxide-simeth  30 mL Oral Once   And  . lidocaine  15 mL Oral Once  . amiodarone  150 mg Intravenous Once  . aspirin  81 mg Oral Daily  . atorvastatin  80 mg Oral q1800  . carvedilol  3.125 mg Oral BID WC  . FLUoxetine  60 mg Oral Daily  . fluticasone furoate-vilanterol  1 puff Inhalation Daily  . heparin  5,000 Units Subcutaneous Q8H  . lamoTRIgine  75 mg Oral QHS  . losartan  12.5 mg Oral Daily  . pantoprazole  40 mg Oral Daily  . sodium chloride flush  3 mL Intravenous Q12H  . spironolactone  12.5 mg Oral Daily  . tamsulosin  0.4 mg Oral QHS  . ticagrelor  90 mg Oral BID   Infusions:  . sodium chloride Stopped (10/17/18 0140)  . sodium chloride 10 mL/hr at 10/19/18 0926  . amiodarone     Followed by  . amiodarone    . amiodarone    . azithromycin Stopped (10/18/18 1116)  . cefTRIAXone (ROCEPHIN)  IV 2 g (10/19/18 0854)  . nitroGLYCERIN Stopped (10/16/18 1151)    Assessment: Pt is a 77yoM with new onset afib with RVR.  Pharmacy is consulted to start heparin drip. CBC relatively stable.  Goal of Therapy:  Heparin level 0.3-0.7 units/ml Monitor platelets by anticoagulation protocol: Yes   Plan:  Start heparin infusion at 1500 units/hr Check anti-Xa level in 8 hours and daily while on heparin Continue to monitor H&H and platelets    Amiodarone Drug - Drug Interaction Consult Note  Recommendations: Amiodarone interacts with fluoxetine, with an intermediate risk of precipitating QT prolongation. Monitor therapy.  Amiodarone is metabolized by the cytochrome P450 system and therefore has the potential to cause many drug interactions. Amiodarone has an average plasma half-life of 50 days (range 20 to 100 days).   There is potential for drug interactions to occur several weeks or months after stopping treatment and the onset of drug interactions may be slow after initiating amiodarone.   [x]  Statins: Increased risk of myopathy. Simvastatin- restrict dose to 20mg  daily. Other statins: counsel patients to report any muscle pain or weakness immediately.  []  Anticoagulants: Amiodarone can increase anticoagulant effect. Consider warfarin dose reduction. Patients should be monitored closely and the dose of anticoagulant altered accordingly,  remembering that amiodarone levels take several weeks to stabilize.  []  Antiepileptics: Amiodarone can increase plasma concentration of phenytoin, the dose should be reduced. Note that small changes in phenytoin dose can result in large changes in levels. Monitor patient and counsel on signs of toxicity.  [x]  Beta blockers: increased risk of bradycardia, AV block and myocardial depression. Sotalol - avoid concomitant use.  []   Calcium channel blockers (diltiazem and verapamil): increased risk of bradycardia, AV block and myocardial depression.  []   Cyclosporine: Amiodarone increases levels of cyclosporine. Reduced dose of cyclosporine is recommended.  []  Digoxin dose should  be halved when amiodarone is started.  []  Diuretics: increased risk of cardiotoxicity if hypokalemia occurs.  []  Oral hypoglycemic agents (glyburide, glipizide, glimepiride): increased risk of hypoglycemia. Patient's glucose levels should be monitored closely when initiating amiodarone therapy.   []  Drugs that prolong the QT interval:  Torsades de pointes risk may be increased with concurrent use - avoid if possible.  Monitor QTc, also keep magnesium/potassium WNL if concurrent therapy can't be avoided. Marland Kitchen Antibiotics: e.g. fluoroquinolones, erythromycin. . Antiarrhythmics: e.g. quinidine, procainamide, disopyramide, sotalol. . Antipsychotics: e.g. phenothiazines, haloperidol.  . Lithium, tricyclic antidepressants, and methadone.   Marcelino Freestone, PharmD PGY2 Cardiology Pharmacy Resident Please check AMION for all Pharmacist numbers by unit 10/19/2018 9:43 AM

## 2018-10-19 NOTE — Progress Notes (Addendum)
Advanced Heart Failure Rounding Note   Subjective:    Feels breathing much worse today. Sats still in low to mid 90s but drops with talking Denies CP, orthopnea or PND.   Developed AF with RVR into 120s this am.    Objective:   Weight Range:  Vital Signs:   Temp:  [97.9 F (36.6 C)-98.3 F (36.8 C)] 98.2 F (36.8 C) (04/19 0400) Pulse Rate:  [69-89] 74 (04/19 0800) Resp:  [15-27] 23 (04/19 0800) BP: (73-123)/(46-100) 73/57 (04/19 0800) SpO2:  [88 %-98 %] 93 % (04/19 0800) Last BM Date: 10/18/18  Weight change: Filed Weights   10/15/18 1709 10/16/18 0036  Weight: 133.4 kg 132.1 kg    Intake/Output:   Intake/Output Summary (Last 24 hours) at 10/19/2018 0858 Last data filed at 10/19/2018 0854 Gross per 24 hour  Intake 1769.88 ml  Output 875 ml  Net 894.88 ml     Physical Exam: General:  Sitting in bed  Dyspneic at rest HEENT: normal Neck: supple. no JVD. Carotids 2+ bilat; no bruits. No lymphadenopathy or thryomegaly appreciated. Cor: PMI nondisplaced. Irregular. Tachy. No rubs, gallops or murmurs. Lungs: crackles on right Abdomen: soft, nontender, nondistended. No hepatosplenomegaly. No bruits or masses. Good bowel sounds. Extremities: no cyanosis, clubbing, rash, edema Neuro: alert & orientedx3, cranial nerves grossly intact. moves all 4 extremities w/o difficulty. Affect pleasant   Telemetry: Sinus early am now AF with RVR up to 130s Personally reviewed   Labs: Basic Metabolic Panel: Recent Labs  Lab 10/15/18 1722 10/16/18 0250 10/17/18 0255 10/18/18 0259 10/19/18 0321  NA 133* 133* 136 132* 133*  K 3.4* 3.9 3.6 3.5 3.7  CL 97* 98 96* 96* 98  CO2 21* 21* 31 25 25   GLUCOSE 120* 171* 139* 134* 132*  BUN 15 17 23  30* 29*  CREATININE 1.06 1.13 1.44* 1.44* 1.44*  CALCIUM 9.1 8.6* 8.6* 8.3* 8.3*  MG  --   --   --   --  2.3    Liver Function Tests: Recent Labs  Lab 10/16/18 0250  AST 163*  ALT 37  ALKPHOS 75  BILITOT 1.2  PROT 7.0   ALBUMIN 3.9   No results for input(s): LIPASE, AMYLASE in the last 168 hours. No results for input(s): AMMONIA in the last 168 hours.  CBC: Recent Labs  Lab 10/15/18 2051 10/16/18 0250 10/16/18 0843 10/16/18 2201 10/17/18 0255 10/18/18 0259 10/19/18 0321  WBC 17.2* 24.6* 21.2*  --  22.3* 17.4* 15.6*  NEUTROABS 15.0*  --   --   --   --   --   --   HGB 15.1 15.1 15.3  --  14.6 13.4 12.1*  HCT 46.3 46.4 46.5  --  44.1 40.8 38.6*  MCV 93.3 91.7 92.6  --  93.8 93.2 93.9  PLT 291 303 289 286 289 258 267    Cardiac Enzymes: Recent Labs  Lab 10/15/18 1722 10/15/18 2022 10/16/18 0250 10/16/18 0843 10/16/18 1608  TROPONINI 0.05* 1.40* 12.54* 15.50* >65.00*    BNP: BNP (last 3 results) No results for input(s): BNP in the last 8760 hours.  ProBNP (last 3 results) No results for input(s): PROBNP in the last 8760 hours.    Other results:  Imaging: Dg Chest Port 1 View  Result Date: 10/18/2018 CLINICAL DATA:  Acute systolic heart failure. EXAM: PORTABLE CHEST 1 VIEW COMPARISON:  10/16/2018 FINDINGS: The cardiac silhouette remains mildly enlarged. Right midlung opacity appears more confluent compared to the prior study. Patchy bibasilar  opacities have not substantially changed. No large pleural effusion or pneumothorax is identified. IMPRESSION: Bilateral airspace opacities with interval worsening in the right midlung. Electronically Signed   By: Sebastian Ache M.D.   On: 10/18/2018 08:18     Medications:     Scheduled Medications: . alum & mag hydroxide-simeth  30 mL Oral Once   And  . lidocaine  15 mL Oral Once  . aspirin  81 mg Oral Daily  . atorvastatin  80 mg Oral q1800  . carvedilol  3.125 mg Oral BID WC  . FLUoxetine  60 mg Oral Daily  . fluticasone furoate-vilanterol  1 puff Inhalation Daily  . heparin  5,000 Units Subcutaneous Q8H  . lamoTRIgine  75 mg Oral QHS  . losartan  12.5 mg Oral Daily  . pantoprazole  40 mg Oral Daily  . sodium chloride flush  3 mL  Intravenous Q12H  . spironolactone  12.5 mg Oral Daily  . tamsulosin  0.4 mg Oral QHS  . ticagrelor  90 mg Oral BID    Infusions: . sodium chloride Stopped (10/17/18 0140)  . sodium chloride Stopped (10/18/18 0856)  . azithromycin Stopped (10/18/18 1116)  . cefTRIAXone (ROCEPHIN)  IV 2 g (10/19/18 0854)  . nitroGLYCERIN Stopped (10/16/18 1151)    PRN Medications: sodium chloride, acetaminophen, albuterol, morphine injection, ondansetron (ZOFRAN) IV, sodium chloride flush    Coronary Diagrams   Diagnostic  Dominance: Right    Intervention     Assessment:   Justin Bradford. is a 78 y.o. male with a hx of HTN, HLD, BPH and obesitywho was admitted with NSTEMI and found to have totally occluded LAD s/p DES on 4/16. Post MI care complicated by acute systolic HF with EF 30%   Plan/Discussion:     1.  CAD/NSTEMI: - peak trop > 65 - cath 4/16 with total occlusion of LAD -> s/p DES (55% residual stenosis due to heavy calcification) - Residual CAD with LCx 60 and RCA 20%. EF 30% with LVEDP 27 - Continue DAPT, statin and b-blocker - Ambulate carefully  2. 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  - BP soft. May need to hold Entresto - Volume status looks good on exam. Hold diuretics.  - Will place PICC to check CVP and co-ox given worsening dyspnea   3. Acute hypoxic respiratory failure - symptomatically worse today with sats in low 90s. drops into high 80s at rest - has COPD but sats 96% on recent eval with Pulmoanry - CXR worse yesterday despite diuresis. Will repeat in am  - PCT 0.38. WBC 22k-> 17.4 -> 15.6 Flu negative. CoV-19 negative x 2. Viral panel negative - Would continue abx. Sputum cx.  - I reviewed imaging and case with Pulmonary. Suspect viral pneumonitis or capillary leak. Continue supportive care - Repeat CXR today. Will give one dose solumedrol 80 and  assess response.  - Keep in ICU   4.  HLD: -LDL, 119 on 10/16/2018 -Continue high intensity atorvastatin  5.  COPD: -Recently evaluated by pulmonary medicine and is now treated for COPD with Breo and Ventolin -Management per IM  6. AKI - ? Shock vs CIN vs diuresis.  - Creatinine stable at 1.44 today - will follow closely  7. Hypokalemia - K 3.7 supp  8. PAF with RVR - start IV amio and heparin. Will need to watch closely for bleeding  with triple therapy.   CRITICAL CARE Performed by: Arvilla Meres  Total critical care time: 35 minutes  Critical care time was exclusive of separately billable procedures and treating other patients.  Critical care was necessary to treat or prevent imminent or life-threatening deterioration.  Critical care was time spent personally by me (independent of midlevel providers or residents) on the following activities: development of treatment plan with patient and/or surrogate as well as nursing, discussions with consultants, evaluation of patient's response to treatment, examination of patient, obtaining history from patient or surrogate, ordering and performing treatments and interventions, ordering and review of laboratory studies, ordering and review of radiographic studies, pulse oximetry and re-evaluation of patient's condition.   Length of Stay: 4  Arvilla Meres MD 10/19/2018, 8:58 AM  Advanced Heart Failure Team Pager [phone] (M-F; 7a - 4p)  Please contact [facility] for night-coverage after hours (4p -7a ) and weekends on [email or web address]

## 2018-10-19 NOTE — Consult Note (Addendum)
NAME:  Justin Bradford., MRN:  [id number], DOB:  30-Dec-1940, LOS: 4 ADMISSION DATE:  10/15/2018, CONSULTATION DATE:  10/19/2018 REFERRING MD:  Dr. Gala Romney CHIEF COMPLAINT:  Shortness of breath   Brief History   78yo male who presented with PMH of COPD, HTN, HLD, BPH, T2DM who presented with chest pain and shortness of breath and was treated for LAD occlusion with DES stent, HF exacerbation and CAP without improvement in dyspnea or hypoxia.  History of present illness   78yo male who presented with chest pain associated with dyspnea, malaise and nausea on 10/15/18 found to have leukocytosis and pulmonary infiltrates concerning for pneumonia. Troponins peaked >65; he is s/p cath which found 100% LAD occlusion s/p DES stenting. EF is 30-35%; RV with normal systolic function though unable to estimate pressures; valves not well visualized; IVC with >50% repirophasic variability. CTA negative for acute PE; ground-glass changes noted worse on RUL posteriorly. He has been diuresed, covered for CAP with azithromycin and ceftriaxone, and maintained on his home COPD medications. Despite this treatment he is still dyspneic, with ongoing pulmonary infiltrate and requiring 5-6L HFNC.  Past Medical History  HTN, T2DM, HLD, BPH, COPD  Significant Hospital Events   4/15 Admitted  Consults:  HF PCCM  Procedures:  4/16 LHC 100% LAD occlusion s/p DES; 60% circ stenosis  Significant Diagnostic Tests:  4/15 CXR bilateral basilar atelectasis vs infiltrate 4/15 CTA - no acute PE, right sided ground-glass opacities 4/16 Echo EF 30-35% 4/16 CXR increased R>L pulmonary opacities 4/19 CXR ongoing bilateral pulmonary infiltrates   Micro Data:  COVID-19 negative x2 Resp panel 4/16 negative Step pneumo 4/15 negative Legionella 4/15 negative BCx 4/15 NGTD  Antimicrobials:  Azithromycin 4/15> Ceftriaxone 4/15>  Interim history/subjective:  Patient endorses continued dyspnea even at rest, that is  significantly worsened with activity.  Objective   Blood pressure 106/63, pulse (!) 102, temperature 98.2 F (36.8 C), temperature source Oral, resp. rate 20, height 6' (1.829 m), weight 132.1 kg, SpO2 92 %.        Intake/Output Summary (Last 24 hours) at 10/19/2018 1037 Last data filed at 10/19/2018 0854 Gross per 24 hour  Intake 1449.84 ml  Output 875 ml  Net 574.84 ml   Filed Weights   10/15/18 1709 10/16/18 0036  Weight: 133.4 kg 132.1 kg    Examination: General: elderly gentleman resting in bed HENT: anicteric sclera, unable to appreciate JVD Lungs: pursed lip breathing, inspiratory crackles throughout, no wheezing Cardiovascular: RRR, no m/r/g appreciated, warm extremities with pulses intact Abdomen: soft, obese, nontender Extremities: warm, no clubbing of digits Neuro: mild right hand resting tremor, generalized global weakness, face symmetric Skin: warm, dry w/o rashes  Resolved Hospital Problem list     Assessment & Plan:  Hypoxic respiratory failure with R>L pulmonary infiltrates: Already addressing bacterial pneumonia and volume overload appropriately. Workup for viral source has been negative. On review of imaging with Dr. Tonia Brooms and Dr. Craige Cotta, may want to consider aspiration due to bilateral and posterior nature of infiltrates. -continue diuresis; agree with PICC placement -continue CAP coverage for 5 day total duration -will treat with 5d course of prednisone -switched breo to nebs -wean O2 with goal 88-92% -IS and flutter valve -ambulate and sit up in chair as able  CAD NSTEMI s/p LAD DES: Peak troponin >65; cath on 4/16 revealed 100% LAD stenosis as well as 60% LCx and 20% RCA; he is s/p LAD DES with residual 55% stenosis.  -continue medical therapy with DAPT,  statin, BB, Entresto  Acute systolic CHF in setting of NSTEMI: EF 30-35% on recent echo; inadequate evaluation of valves.   -continue diuresis -continue entresto, spironolactone, BB  Labeled  COPD, former tobacco user: FEV1/FVC 73, LLN 61 on recent PFTs in March. -continue Breo and ventolin  PAF: New this morning. Started on amiodarone and heparin today.   AKI: In setting of contrast and starting entresto. Continue monitoring.   Best practice:  Diet: HH Pain/Anxiety/Delirium protocol (if indicated): n/a VAP protocol (if indicated): n/a DVT prophylaxis: heparin GI prophylaxis: n/a Glucose control: n/a Mobility: up with assistance Code Status: FULL Family Communication: able to discuss with patient  Disposition: ICU   Labs   CBC: Recent Labs  Lab 10/15/18 2051 10/16/18 0250 10/16/18 0843 10/16/18 2201 10/17/18 0255 10/18/18 0259 10/19/18 0321  WBC 17.2* 24.6* 21.2*  --  22.3* 17.4* 15.6*  NEUTROABS 15.0*  --   --   --   --   --   --   HGB 15.1 15.1 15.3  --  14.6 13.4 12.1*  HCT 46.3 46.4 46.5  --  44.1 40.8 38.6*  MCV 93.3 91.7 92.6  --  93.8 93.2 93.9  PLT 291 303 289 286 289 258 267    Basic Metabolic Panel: Recent Labs  Lab 10/15/18 1722 10/16/18 0250 10/17/18 0255 10/18/18 0259 10/19/18 0321  NA 133* 133* 136 132* 133*  K 3.4* 3.9 3.6 3.5 3.7  CL 97* 98 96* 96* 98  CO2 21* 21* 31 25 25   GLUCOSE 120* 171* 139* 134* 132*  BUN 15 17 23  30* 29*  CREATININE 1.06 1.13 1.44* 1.44* 1.44*  CALCIUM 9.1 8.6* 8.6* 8.3* 8.3*  MG  --   --   --   --  2.3   GFR: Estimated Creatinine Clearance: 61.4 mL/min (A) (by C-G formula based on SCr of 1.44 mg/dL (H)). Recent Labs  Lab 10/15/18 2050  10/16/18 0843 10/17/18 0255 10/18/18 0259 10/19/18 0321  PROCALCITON  --   --   --  0.38  --   --   WBC  --    < > 21.2* 22.3* 17.4* 15.6*  LATICACIDVEN 1.7  --   --   --   --   --    < > = values in this interval not displayed.    Liver Function Tests: Recent Labs  Lab 10/16/18 0250  AST 163*  ALT 37  ALKPHOS 75  BILITOT 1.2  PROT 7.0  ALBUMIN 3.9   No results for input(s): LIPASE, AMYLASE in the last 168 hours. No results for input(s): AMMONIA  in the last 168 hours.  ABG    Component Value Date/Time   HCO3 23.7 10/16/2018 0255   ACIDBASEDEF 1.1 10/16/2018 0255   O2SAT 91.0 10/16/2018 0255     Coagulation Profile: No results for input(s): INR, PROTIME in the last 168 hours.  Cardiac Enzymes: Recent Labs  Lab 10/15/18 1722 10/15/18 2022 10/16/18 0250 10/16/18 0843 10/16/18 1608  TROPONINI 0.05* 1.40* 12.54* 15.50* >65.00*    HbA1C: No results found for: HGBA1C  CBG: No results for input(s): GLUCAP in the last 168 hours.  Review of Systems:   Continued shortness of breath  Past Medical History  He,  has a past medical history of Hypertension and Peripheral neuropathy.   Surgical History    Past Surgical History:  Procedure Laterality Date  . CORONARY STENT INTERVENTION N/A 10/16/2018   Procedure: CORONARY STENT INTERVENTION;  Surgeon: Lennette Bihari, MD;  Location: [facility];  Service: Cardiovascular;  Laterality: N/A;  . KNEE SURGERY    . LEFT HEART CATH AND CORONARY ANGIOGRAPHY N/A 10/16/2018   Procedure: LEFT HEART CATH AND CORONARY ANGIOGRAPHY;  Surgeon: Lennette BihariKelly, Thomas A, MD;  Location: [facility];  Service: Cardiovascular;  Laterality: N/A;     Social History   reports that he has quit smoking. He has never used smokeless tobacco. He reports current alcohol use. He reports that he does not use drugs.   Family History   His family history is not on file.   Allergies No Known Allergies   Home Medications  Prior to Admission medications   Medication Sig Start Date End Date Taking? Authorizing Provider  aspirin EC 81 MG tablet Take 81 mg by mouth daily.   Yes [provider]  BREO ELLIPTA 100-25 MCG/INH AEPB Inhale 1 puff into the lungs daily. 09/25/18  Yes [provider]  FLUoxetine (PROZAC) 20 MG capsule Take 60 mg by mouth every morning. 01/28/17  Yes [provider]  hydrochlorothiazide (HYDRODIURIL) 50 MG tablet Take 50 mg by mouth every morning.     Yes [provider]  lamoTRIgine (LAMICTAL) 25 MG tablet Take 75 mg by mouth at bedtime. 01/28/17  Yes [provider]  losartan (COZAAR) 25 MG tablet Take 50 mg by mouth every morning. 08/16/18  Yes [provider]  metFORMIN (GLUCOPHAGE) 500 MG tablet Take 250 mg by mouth every morning.    Yes [provider]  omeprazole (PRILOSEC) 20 MG capsule Take 20 mg by mouth every morning. 05/10/18  Yes [provider]  simvastatin (ZOCOR) 40 MG tablet Take 40 mg by mouth every morning.    Yes [provider]  spironolactone (ALDACTONE) 25 MG tablet Take 25 mg by mouth 2 (two) times daily.    Yes [provider]  tamsulosin (FLOMAX) 0.4 MG CAPS capsule Take 0.4 mg by mouth at bedtime.    Yes [provider]  VENTOLIN HFA 108 (90 Base) MCG/ACT inhaler Inhale 1 puff into the lungs every 4 (four) hours as needed for wheezing. 09/11/18  Yes [provider]  metoprolol succinate (TOPROL-XL) 50 MG 24 hr tablet Take 50 mg by mouth daily. 05/10/18   [provider]    Nyra MarketGorica Svalina, MD PGY3 Pager [phone]   _____________________________________________________________________________  PCCM Attending:   78 yo M, admitted for BL infiltrates, SOB, h/o COPD, HLD, BPH, DMII, found to have LAD occlusion s/p Stent with DES, new EF 30-35%  BP 99/63 (BP Location: Left Arm)   Pulse 82   Temp 97.8 F (36.6 C) (Oral)   Resp (!) 26   Ht 6' (1.829 m)   Wt 132.1 kg   SpO2 92%   BMI 39.50 kg/m   Gen: elderly male, no significant distress, resting in bed,  Heart: RRR, s1 s2, sinus on tele Lungs: BL ins crackles  CXR: BL infiltrates, worsening air space disease in the right as compared to left, The patient's images have been independently reviewed by me.  CT scan from previous with basilar predominate disease  - one should consider aspiration events   Labs and histories reviewed.   A:  Bilateral infiltrates   AHRF  ACS s/p LAD DES  Acute on chronic systolic heart failure Afib RVR on amio  Former smoker 46 years Label with COPD in past  Chronic alcohol use, >50 years  Baseline tremor   P: Agree with continued diuresis  Basilar infiltrates, concern for possible aspirations. He does have a baseline tremor and long standing >50 years of 2 beers per day  Other etiologies of worsening hypoxemia could be from his long standing tobacco use  Overall, likely multifactorial Agree with a short course of steroids  Change to  prednisone oral daily tomorrow X 4 days  Change to all nebs, pulmicort, brovana and A&A  Continue diuresis per cardiology, had picc placed today  Heart failure regimen per cardiology    Josephine Igo, DO [facility] 10/19/2018 2:37 PM

## 2018-10-19 NOTE — Progress Notes (Signed)
Peripherally Inserted Central Catheter/Midline Placement  The IV Nurse has discussed with the patient and/or persons authorized to consent for the patient, the purpose of this procedure and the potential benefits and risks involved with this procedure.  The benefits include less needle sticks, lab draws from the catheter, and the patient may be discharged home with the catheter. Risks include, but not limited to, infection, bleeding, blood clot (thrombus formation), and puncture of an artery; nerve damage and irregular heartbeat and possibility to perform a PICC exchange if needed/ordered by physician.  Alternatives to this procedure were also discussed.  Bard Power PICC patient education guide, fact sheet on infection prevention and patient information card has been provided to patient /or left at bedside.    PICC/Midline Placement Documentation  PICC Triple Lumen 10/19/18 PICC Right Brachial 42 cm 0 cm (Active)  Indication for Insertion or Continuance of Line Limited venous access - need for IV therapy >5 days (PICC only) 10/19/2018 11:00 AM  Exposed Catheter (cm) 0 cm 10/19/2018 11:00 AM  Site Assessment Clean;Dry;Intact 10/19/2018 11:00 AM  Lumen #1 Status Flushed;Blood return noted;Saline locked 10/19/2018 11:00 AM  Lumen #2 Status Flushed;Blood return noted;Saline locked 10/19/2018 11:00 AM  Lumen #3 Status Flushed;Blood return noted;Saline locked 10/19/2018 11:00 AM  Dressing Type Transparent 10/19/2018 11:00 AM  Dressing Status Clean;Dry;Intact;Antimicrobial disc in place 10/19/2018 11:00 AM  Line Care Connections checked and tightened 10/19/2018 11:00 AM  Line Adjustment (NICU/IV Team Only) No 10/19/2018 11:00 AM  Dressing Intervention New dressing 10/19/2018 11:00 AM  Dressing Change Due 10/26/18 10/19/2018 11:00 AM       Edwin Cap 10/19/2018, 11:16 AM

## 2018-10-19 NOTE — Progress Notes (Signed)
  Date: 10/19/2018  Patient name: Justin Bradford.  Medical record number: [id number]  Date of birth: May 28, 1941   I have seen and evaluated this patient and I have discussed the plan of care with the house staff. Please see their note for complete details. I concur with their findings with the following additions/corrections: [name] was seen this morning on team rounds.  I agree with Dr. Chesley Mires that when we saw him, he had no respiratory distress and his lungs were clear without crackles or wheezing.  Subsequently, he developed A. fib with RVR.  This then led to worsening dyspnea and hypoxia.  Pulmonology and critical care has been consulted.  A PICC line was placed to be able to check a CVP and Co-ox.  Rocephin and azithromycin have been continued.  Steroids have been started and inhalers changed to nebulizers.  We will follow his progress with these changes.  Burns Spain, MD 10/19/2018, 12:42 PM

## 2018-10-19 NOTE — Discharge Summary (Addendum)
Name: Justin Bradford. MRN: [id number] DOB: 1941/05/27 78 y.o. PCP: Forrest Moron, MD  Date of Admission: 10/15/2018  5:06 PM Date of Discharge: 10/27/18 Attending Physician: Inez Catalina, MD  Discharge Diagnosis: 1. 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 Acute hypoxic respiratory failure 3. Acute HFrEF 4. New onset afib  5. Parkinsonian features  6.  COPD 7.  Generalized anxiety disorder 8.  Acute kidney injury 9. Thoracic aortic aneurysm  Discharge Medications: Allergies as of 10/27/2018   No Known Allergies     Medication List    STOP taking these medications   Breo Ellipta 100-25 MCG/INH Aepb Generic drug:  fluticasone furoate-vilanterol Replaced by:  fluticasone furoate-vilanterol 200-25 MCG/INH Aepb   hydrochlorothiazide 50 MG tablet Commonly known as:  HYDRODIURIL   metoprolol succinate 50 MG 24 hr tablet Commonly known as:  TOPROL-XL   omeprazole 20 MG capsule Commonly known as:  PRILOSEC Replaced by:  pantoprazole 40 MG tablet   simvastatin 40 MG tablet Commonly known as:  ZOCOR   Ventolin HFA 108 (90 Base) MCG/ACT inhaler Generic drug:  albuterol Replaced by:  albuterol (2.5 MG/3ML) 0.083% nebulizer solution     TAKE these medications   albuterol (2.5 MG/3ML) 0.083% nebulizer solution Commonly known as:  PROVENTIL Take 3 mLs (2.5 mg total) by nebulization every 4 (four) hours as needed for wheezing. Replaces:  Ventolin HFA 108 (90 Base) MCG/ACT inhaler   amiodarone 200 MG tablet Commonly known as:  PACERONE Take 1 tablet (200 mg total) by mouth daily. Start taking on:  October 28, 2018   aspirin EC 81 MG tablet Take 81 mg by mouth daily.   atorvastatin 80 MG tablet Commonly known as:  LIPITOR Take 1 tablet (80 mg total) by mouth daily at 6 PM.   carvedilol 3.125 MG tablet Commonly known as:  COREG Take 1 tablet (3.125 mg total) by mouth 2 (two) times daily with a meal for 30 days.   clonazePAM 1 MG tablet Commonly known as:   KLONOPIN Take 1 tablet (1 mg total) by mouth at bedtime.   FLUoxetine 20 MG capsule Commonly known as:  PROZAC Take 3 capsules (60 mg total) by mouth daily for 30 days. Start taking on:  October 28, 2018 What changed:  when to take this   fluticasone furoate-vilanterol 200-25 MCG/INH Aepb Commonly known as:  BREO ELLIPTA Inhale 1 puff into the lungs daily for 30 days. Start taking on:  October 28, 2018 Replaces:  Breo Ellipta 100-25 MCG/INH Aepb   furosemide 80 MG tablet Commonly known as:  LASIX Take 1 tablet (80 mg total) by mouth daily for 30 days. Start taking on:  October 28, 2018   hydrocortisone cream 1 % Apply 1 application topically 3 (three) times daily as needed for itching (minor skin irritation).   lamoTRIgine 25 MG tablet Commonly known as:  LAMICTAL Take 75 mg by mouth at bedtime.   losartan 25 MG tablet Commonly known as:  COZAAR Take 0.5 tablets (12.5 mg total) by mouth daily for 30 days. Start taking on:  October 28, 2018 What changed:    how much to take  when to take this   metFORMIN 500 MG tablet Commonly known as:  GLUCOPHAGE Take 250 mg by mouth every morning.   pantoprazole 40 MG tablet Commonly known as:  PROTONIX Take 1 tablet (40 mg total) by mouth daily for 30 days. Start taking on:  October 28, 2018 Replaces:  omeprazole 20 MG capsule  potassium chloride SA 20 MEQ tablet Commonly known as:  K-DUR Take 2 tablets (40 mEq total) by mouth daily for 30 days. Start taking on:  October 28, 2018   spironolactone 25 MG tablet Commonly known as:  ALDACTONE Take 0.5 tablets (12.5 mg total) by mouth daily for 30 days. Start taking on:  October 28, 2018 What changed:    how much to take  when to take this   tamsulosin 0.4 MG Caps capsule Commonly known as:  FLOMAX Take 0.4 mg by mouth at bedtime.   ticagrelor 90 MG Tabs tablet Commonly known as:  BRILINTA Take 1 tablet (90 mg total) by mouth 2 (two) times daily for 30 days.       Disposition  and follow-up:   Justin D Caedin Cocking. was discharged from [facility] in Stable condition.  At the hospital follow up visit please address:  Please call patient's sister with periodic updates. [employer]: [phone]  1.  Acute MI s/p DES to LAD: Remained chest pain free following intervention.   - continue Aspirin 81 mg daily, Bilinta, Lipitor 80mg  daily, Coreg  Acute HFrEF in the setting of LAD infarct:   - evaluate respiratory and volume status  - discharged on Lasix 80 mg Coreg 3.125 mg bid , Spironolactone 12.5 mg daily and Losartan 12.5 mg daily. Also started on 40 mg KCl.   Hypoxic respiratory failure: Completed course for community-acquired pneumonia             -Repeat CT in 6-8 weeks to monitor for resolution.             -Follow-up pulmonology first week of May  COPD:             -Increase home Breo to 200-25 1 inhalation daily            -Add Spiriva 2.5 mcg 2 puffs daily            -Continue Albuterol 2 puffs every 4 hours as needed for shortness of breath or wheezing             #Aortic Aneurysm:CT Chest revealed 4.3 cm ascending thoracic aortic aneurysm             -Annual imaging followup by CTA or MRA  2.  Labs / imaging needed at time of follow-up: BMP, CBC  3.  Pending labs/ test needing follow-up: none  Follow-up Appointments: Follow-up Information    [facility] Follow up on 11/04/2018.   Specialty:  Cardiology Why:  at 9:30 Telehealth visit Contact information: [address] [id number] [place]he[place] [address] [phone]          Hospital Course by problem list: Justin Bradford is a 78 yo M w/ PMH of HTN, HLD, T2DM, BPH, obesity presented with acute onset chest pain, nausea, malaise, subjective fever, and dyspnea. On presentation he was hypoxic to 86%. Initial work-up included CT angio which was negative for PE, but showed scattered ground-glass changes, predominantly in  right upper lobe. He also had a leukocytosis with left shift. 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 His rapid testing was negative x2. RVP was also negative. After admission, he developed increased oxygen requirement, worsening of chest pain, and uptrend of troponin from 0.05>1.4. This was thought to be due to possible viral myocarditis given the atypical nature of pain. However, he continued to worsen  clinically and had EKG changes, as well as continued rise in troponin (up to 65). He was subsequently taking for emergent cath the following morning and found to have 100% occlusion of LAD s/p DES. His hospital course was complicated by hypoxic respiratory failure, acute HFrEF with EF of 30-35%, and new onset afib with RVR   1. Acute anterior MI: Underwent cath on 4/16 which revealed 100% occlusion of proximal LAD. He underwent successful PCI with DES. Initiated on DAPT with Aspirin and Brilinta, beta-blocker (Coreg) and continued on high intensity statin (Lipitor).   2. Acute HFrEF: In the setting of acute LAD infarct. Echo no 4/16 with EF of 30-35%. Required IV diuresis due to significant pulmonary edema and hypoxia. He had slow clinical improvement over the next several days and was able to wean fully off oxygen. Regimen at discharge included Coreg, Spironolactone and Losartan.   3. Acute hypoxic respiratory failure: Most likely in the setting of LAD infarct complicated by HFrEF as well as viral pneumonia based on imaging findings. He was also completed course of antibiotics empirically to cover CAP. After a prolonged course, he was eventually able to be weaned to room air.  He had a repeat CT scan of the chest which revealed large interstitial opacity of the right upper lobe, multiple small opacities in the left upper lobe, minimal bilateral pleural effusion.  Even though his CT scan was worrisome, clinically he had made much improvement and  oxygen saturation was in the high 90s on room air. Pulmonology recommended repeat CT in 6-8 weeks.   4. New onset afib with RVR: Patient developed morning of hospital day HD#4. Most likely due to multiple stressors of hypoxic respiratory failure and HFrEF. He was initiated on IV heparin and amiodarone.  IV heparin was subsequently discontinued as he did not have any further episodes of atrial fibrillation.  IV amiodarone was transitioned to oral at discharge to continue for a total of 1 month and discontinue if remains in sinus rhythm.   5. 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 Renal function remained stable with diuresis. Crt 1.4 day of discharge.   6. Parkinsonian features: Patient had persistent involuntary writhing movements of bilateral lower extremities and endorsed a resting tremor of his right hand. He has been followed by psychiatrist and psychologist for several years since his wife passed. Currently on Lamotrigine and Fluoxetine which do not typically cause tardive dyskinesia. There were no other medications on his outpatient list that are known for this side effect. Given findings, would benefit from outpatient neurologic work-up for possible PD.   7. Generalized anxiety disorder: Continued on home lamotrigine, Fluoxetine and Klonopin qhs.    Discharge Vitals:   BP 101/65 (BP Location: Left Arm)   Pulse 85   Temp 98.7 F (37.1 C) (Oral)   Resp (!) 22   Ht 6' (1.829 m)   Wt 125.9 kg   SpO2 92%   BMI 37.64 kg/m   Pertinent Labs, Studies, and Procedures:  Left heart cath 10/16/18  Late presentation anterior wall myocardial infarction secondary to total proximal occlusion of a severely calcified proximal LAD with TIMI 0 flow.  Mild to moderate concomitant CAD with 60% distal AV groove circumflex stenosis and calcified dominant RCA with 20% proximal stenosis (the RCA was not optimally selectively engaged but visualization was  adequate).  LVEDP 27 mmHg.  Difficult but successful PCI to a totally occluded severely calcified proximal LAD with PTCA, Cutting  Balloon, and ultimate stenting with a 3.0 x 38 mm Resolute DES stent postdilated to 3.3 to 3.5 mm.  There was still moderate narrowing in the proximal portion of the stent which could not be optimally  expanded secondary to the significant residual calcification.  Aggrastat was administered due to transient low flow which ultimately normalized.  Echo 10/16/18 IMPRESSIONS  1. The left ventricle has moderate-severely reduced systolic function, with an ejection fraction of 30-35%. The cavity size was normal. Left ventricular diastolic function could not be evaluated due to nondiagnostic images.  2. No evidence of LV thrombus by definity contrast.  3. The right ventricle has normal systolc function. The cavity was normal. There is no increase in right ventricular wall thickness. Right ventricular systolic pressure could not be assessed.  4. The aortic valve is tricuspid Moderate sclerosis of the aortic valve.  CT Chest IMPRESSION: New large interstitial opacity is noted in the right upper lobe, as well as multiple smaller opacities are noted in the left upper lobe. These findings are concerning for multifocal interstitial or atypical pneumonia.  Minimal bilateral pleural effusions are noted with adjacent subsegmental atelectasis.  4.3 cm ascending thoracic aortic aneurysm. Recommend annual imaging followup by CTA or MRA. This recommendation follows 2010 ACCF/AHA/AATS/ACR/ASA/SCA/SCAI/SIR/STS/SVM Guidelines for the Diagnosis and Management of Patients with Thoracic Aortic Disease. Circulation. 2010; 121: Z610-R604. Aortic aneurysm NOS (ICD10-I71.9).  Discharge Instructions: Discharge Instructions    (HEART FAILURE PATIENTS) Call MD:  Anytime you have any of the following symptoms: 1) 3 pound weight gain in 24 hours or 5 pounds in 1 week 2) shortness of  breath, with or without a dry hacking cough 3) swelling in the hands, feet or stomach 4) if you have to sleep on extra pillows at night in order to breathe.   Complete by:  As directed    Amb Referral to Cardiac Rehabilitation   Complete by:  As directed    Referring to [facility]   Diagnosis:   NSTEMI Coronary Stents     Call MD for:  difficulty breathing, headache or visual disturbances   Complete by:  As directed    Call MD for:  severe uncontrolled pain   Complete by:  As directed    Diet - low sodium heart healthy   Complete by:  As directed    Discharge instructions   Complete by:  As directed    Justin Bradford,  It was a pleasure taking care of you! I am so glad you are feeling better. I wish you a continued recovery as you work to regain some strength in rehab.  You have been started on several new medications for your heart. Please be sure to take these every day as instructed.  You will have follow-up cardiology and pulmonology in the next few weeks.   Take care! Dr. Chesley Mires   Increase activity slowly   Complete by:  As directed       Signed: Bridget Hartshorn, DO 10/27/2018, 11:42 AM   Pager: [phone]

## 2018-10-20 ENCOUNTER — Inpatient Hospital Stay (HOSPITAL_COMMUNITY): Payer: Medicare Other

## 2018-10-20 DIAGNOSIS — E876 Hypokalemia: Secondary | ICD-10-CM

## 2018-10-20 DIAGNOSIS — F411 Generalized anxiety disorder: Secondary | ICD-10-CM

## 2018-10-20 DIAGNOSIS — J189 Pneumonia, unspecified organism: Secondary | ICD-10-CM

## 2018-10-20 DIAGNOSIS — I5043 Acute on chronic combined systolic (congestive) and diastolic (congestive) heart failure: Secondary | ICD-10-CM

## 2018-10-20 LAB — HEPARIN LEVEL (UNFRACTIONATED)
Heparin Unfractionated: 0.25 IU/mL — ABNORMAL LOW (ref 0.30–0.70)
Heparin Unfractionated: 0.38 IU/mL (ref 0.30–0.70)

## 2018-10-20 LAB — BASIC METABOLIC PANEL
Anion gap: 10 (ref 5–15)
BUN: 25 mg/dL — ABNORMAL HIGH (ref 8–23)
CO2: 21 mmol/L — ABNORMAL LOW (ref 22–32)
Calcium: 7 mg/dL — ABNORMAL LOW (ref 8.9–10.3)
Chloride: 104 mmol/L (ref 98–111)
Creatinine, Ser: 1.13 mg/dL (ref 0.61–1.24)
GFR calc Af Amer: >60 mL/min (ref >60)
GFR calc non Af Amer: >60 mL/min (ref >60)
Glucose, Bld: 237 mg/dL — ABNORMAL HIGH (ref 70–99)
Potassium: 3.1 mmol/L — ABNORMAL LOW (ref 3.5–5.1)
Sodium: 135 mmol/L (ref 135–145)

## 2018-10-20 LAB — CULTURE, BLOOD (ROUTINE X 2)
Culture: NO GROWTH
Culture: NO GROWTH
Special Requests: ADEQUATE

## 2018-10-20 LAB — CBC
HCT: 37 % — ABNORMAL LOW (ref 39.0–52.0)
Hemoglobin: 12.1 g/dL — ABNORMAL LOW (ref 13.0–17.0)
MCH: 30.3 pg (ref 26.0–34.0)
MCHC: 32.7 g/dL (ref 30.0–36.0)
MCV: 92.7 fL (ref 80.0–100.0)
Platelets: 356 10*3/uL (ref 150–400)
RBC: 3.99 MIL/uL — ABNORMAL LOW (ref 4.22–5.81)
RDW: 13 % (ref 11.5–15.5)
WBC: 22.4 10*3/uL — ABNORMAL HIGH (ref 4.0–10.5)
nRBC: 0 % (ref 0.0–0.2)

## 2018-10-20 LAB — COOXEMETRY PANEL
Carboxyhemoglobin: 1 % (ref 0.5–1.5)
Methemoglobin: 1.4 % (ref 0.0–1.5)
O2 Saturation: 73.4 %
Total hemoglobin: 12.4 g/dL (ref 12.0–16.0)

## 2018-10-20 LAB — SEDIMENTATION RATE: Sed Rate: 42 mm/hr — ABNORMAL HIGH (ref 0–16)

## 2018-10-20 MED ORDER — POTASSIUM CHLORIDE CRYS ER 20 MEQ PO TBCR
60.0000 meq | EXTENDED_RELEASE_TABLET | Freq: Once | ORAL | Status: AC
  Administered 2018-10-20: 60 meq via ORAL
  Filled 2018-10-20: qty 3

## 2018-10-20 MED ORDER — HEPARIN (PORCINE) 25000 UT/250ML-% IV SOLN
2050.0000 [IU]/h | INTRAVENOUS | Status: DC
  Administered 2018-10-20: 09:00:00 1850 [IU]/h via INTRAVENOUS
  Administered 2018-10-21 – 2018-10-22 (×4): 2050 [IU]/h via INTRAVENOUS
  Filled 2018-10-20 (×6): qty 250

## 2018-10-20 MED ORDER — FUROSEMIDE 10 MG/ML IJ SOLN
80.0000 mg | Freq: Two times a day (BID) | INTRAMUSCULAR | Status: DC
  Administered 2018-10-20 (×2): 80 mg via INTRAVENOUS
  Filled 2018-10-20 (×3): qty 8

## 2018-10-20 MED ORDER — POTASSIUM CHLORIDE CRYS ER 20 MEQ PO TBCR: 60.0000 meq | EXTENDED_RELEASE_TABLET | Freq: Once | ORAL | Status: DC

## 2018-10-20 MED ORDER — POTASSIUM CHLORIDE CRYS ER 20 MEQ PO TBCR
60.0000 meq | EXTENDED_RELEASE_TABLET | Freq: Once | ORAL | Status: DC
  Filled 2018-10-20: qty 3

## 2018-10-20 MED ORDER — POTASSIUM CHLORIDE CRYS ER 20 MEQ PO TBCR
40.0000 meq | EXTENDED_RELEASE_TABLET | Freq: Two times a day (BID) | ORAL | Status: AC
  Administered 2018-10-20 (×2): 40 meq via ORAL
  Filled 2018-10-20 (×2): qty 2

## 2018-10-20 MED ORDER — CLONAZEPAM 1 MG PO TABS
1.0000 mg | ORAL_TABLET | Freq: Once | ORAL | Status: AC
  Administered 2018-10-20: 10:00:00 1 mg via ORAL
  Filled 2018-10-20: qty 1

## 2018-10-20 MED ORDER — APIXABAN 5 MG PO TABS: 5.0000 mg | ORAL_TABLET | Freq: Two times a day (BID) | ORAL | Status: DC

## 2018-10-20 MED ORDER — CALCIUM GLUCONATE-NACL 2-0.675 GM/100ML-% IV SOLN
2.0000 g | Freq: Once | INTRAVENOUS | Status: AC
  Administered 2018-10-20: 10:00:00 2000 mg via INTRAVENOUS
  Filled 2018-10-20: qty 100

## 2018-10-20 MED ORDER — CLONAZEPAM 0.5 MG PO TABS
1.0000 mg | ORAL_TABLET | Freq: Every day | ORAL | Status: DC
  Administered 2018-10-20 – 2018-10-26 (×7): 1 mg via ORAL
  Filled 2018-10-20: qty 1
  Filled 2018-10-20 (×3): qty 2
  Filled 2018-10-20: qty 1
  Filled 2018-10-20 (×2): qty 2

## 2018-10-20 MED ORDER — AZITHROMYCIN 250 MG PO TABS
250.0000 mg | ORAL_TABLET | Freq: Every day | ORAL | Status: AC
  Administered 2018-10-20 – 2018-10-21 (×2): 250 mg via ORAL
  Filled 2018-10-20 (×2): qty 1

## 2018-10-20 NOTE — Progress Notes (Signed)
ANTICOAGULATION CONSULT NOTE - Follow-Up Consult  Pharmacy Consult for Heparin Indication: atrial fibrillation  No Known Allergies  Patient Measurements: Height: 6' (182.9 cm) Weight: 291 lb 3.6 oz (132.1 kg) IBW/kg (Calculated) : 77.6 HEPARIN DW (KG): 107.9   Vital Signs: Temp: 98 F (36.7 C) (04/20 1514) Temp Source: Oral (04/20 1514) BP: 98/65 (04/20 1800) Pulse Rate: 73 (04/20 1800)  Labs: Recent Labs    10/18/18 0259 10/19/18 0321 10/19/18 2034 10/20/18 0325 10/20/18 0804 10/20/18 1745  HGB 13.4 12.1*  --  12.1*  --   --   HCT 40.8 38.6*  --  37.0*  --   --   PLT 258 267  --  356  --   --   HEPARINUNFRC  --   --  0.14*  --  0.25* 0.38  CREATININE 1.44* 1.44*  --  1.13  --   --     Estimated Creatinine Clearance: 78.2 mL/min (by C-G formula based on SCr of 1.13 mg/dL).   Medical History: Past Medical History:  Diagnosis Date  . Hypertension   . Peripheral neuropathy     Medications:  Scheduled:  . alum & mag hydroxide-simeth  30 mL Oral Once   And  . lidocaine  15 mL Oral Once  . arformoterol  15 mcg Nebulization BID  . aspirin  81 mg Oral Daily  . atorvastatin  80 mg Oral q1800  . azithromycin  250 mg Oral Daily  . budesonide (PULMICORT) nebulizer solution  0.25 mg Nebulization BID  . Chlorhexidine Gluconate Cloth  6 each Topical Daily  . clonazePAM  1 mg Oral QHS  . FLUoxetine  60 mg Oral Daily  . furosemide  80 mg Intravenous BID  . lamoTRIgine  75 mg Oral QHS  . pantoprazole  40 mg Oral Daily  . potassium chloride  40 mEq Oral BID  . sodium chloride flush  10-40 mL Intracatheter Q12H  . spironolactone  12.5 mg Oral Daily  . tamsulosin  0.4 mg Oral QHS  . ticagrelor  90 mg Oral BID   Infusions:  . sodium chloride 10 mL/hr at 10/20/18 1800  . sodium chloride Stopped (10/19/18 1353)  . amiodarone 30 mg/hr (10/20/18 1800)  . cefTRIAXone (ROCEPHIN)  IV Stopped (10/20/18 0940)  . heparin 2,050 Units/hr (10/20/18 1800)  . nitroGLYCERIN  Stopped (10/16/18 1151)  . norepinephrine (LEVOPHED) Adult infusion Stopped (10/20/18 1116)    Assessment: Pt is a 77yoM with new onset afib with RVR. Pharmacy is consulted to start heparin drip. CBC relatively stable. Pt converted to NSR on amio gtt. Patient also on asa/brilinta planning to continue heparin for now, with transitioning to eliquis/plavix if futher afib noted   Heparin level now therapeutic after rate adjustment this morning.   Goal of Therapy:  Heparin level 0.3-0.7 units/ml Monitor platelets by anticoagulation protocol: Yes   Plan:  -Continue heparin infusion at 2050 units/hr -Daily heparin level/ CBC  Fredonia Highland, PharmD, BCPS Clinical Pharmacist [phone] Please check AMION for all [facility] Pharmacy numbers 10/20/2018

## 2018-10-20 NOTE — Progress Notes (Addendum)
  Date: 10/20/2018  Patient name: Justin Bradford.  Medical record number: [id number]  Date of birth: 03-Oct-1940   I have seen and evaluated this patient and I have discussed the plan of care with the house staff. Please see their note for complete details. I concur with their findings with the following additions/corrections: [name] was seen this morning on team rounds.  He has had significant change in his symptoms and exam findings from yesterday.  He is now having dyspnea when he talks, increased oxygen requirements, while his lungs yesterday were clear today he has crackles bilaterally right greater than left up to the mid lung field, and he is more tremulous.  The acute hypoxic respiratory failure which developed yesterday about the time that he had A. fib with RVR is likely due to flash pulmonary edema secondary to the A. fib. (clarification from HF team -his dyspnea preceded the A. fib which was brief and rapidly treated with amiodarone and therefore do not think the acute hypoxic respiratory failure was due to A. fib but rather the reason for A. fibl).Cardiology is managing his diuresis and he is getting furosemide 80 mg IV twice today.  He is not getting daily weights so we will order that.  His I's and O's are recorded as net + 800 cc since admission.  He does have a diagnosis of COPD but his PFTs in March of this year showed an FEV1 FVC ratio of 73, an FEV1 of 72% with no improvement postbronchodilator.  Additionally, he has had no documented wheezing associated with his hypoxic respiratory failure.  He had been started on prednisone but team elected to stop it today.  Critical care notes indicated they want him to get a 5-day course of prednisone so we will discuss that.  Burns Spain, MD 10/20/2018, 11:05 AM

## 2018-10-20 NOTE — Evaluation (Signed)
Physical Therapy Evaluation Patient Details Name: Justin Bradford. MRN: [id number] DOB: 12/02/1940 Today's Date: 10/20/2018   History of Present Illness  78yo male who presented with PMH of COPD, HTN, HLD, BPH, T2DM who presented with chest pain and shortness of breath with positive NSTEMI and was treated for LAD occlusion with DES stent, HF exacerbation and CAP without improvement in dyspnea or hypoxia.  Clinical Impression  Pt admitted with above diagnosis. Pt currently with functional limitations due to the deficits listed below (see PT Problem List). Pt was able to ambulate with +2 mod assist and Carley Hammed walker with need for 15 LHFNC with ambulation and on 6L O2 at rest.  Desat with activity unless frequent rests and pursed lip breathing.  Feel that pt would benefit from Rehab prior to d/c home as he lives alone and was fully independent PTA.  Will follow acutely.  Pt will benefit from skilled PT to increase their independence and safety with mobility to allow discharge to the venue listed below.      Follow Up Recommendations CIR;Supervision/Assistance - 24 hour    Equipment Recommendations  Rolling walker with 5" wheels;3in1 (PT)    Recommendations for Other Services Rehab consult     Precautions / Restrictions Precautions Precautions: Fall Restrictions Weight Bearing Restrictions: No      Mobility  Bed Mobility Overal bed mobility: Needs Assistance Bed Mobility: Supine to Sit     Supine to sit: Min assist;Mod assist     General bed mobility comments: Needed assist for LEs and for elevation of trunk.   Transfers Overall transfer level: Needs assistance Equipment used: (EVA walker) Transfers: Sit to/from Stand Sit to Stand: Mod assist;+2 physical assistance         General transfer comment: Pt needed assist to power up and cues for hand placement  Ambulation/Gait Ambulation/Gait assistance: Mod assist;+2 safety/equipment Gait Distance (Feet): 120 Feet Assistive  device: (EVA walker) Gait Pattern/deviations: Step-through pattern;Decreased stride length;Trunk flexed;Wide base of support;Staggering right;Staggering left;Leaning posteriorly   Gait velocity interpretation: <1.31 ft/sec, indicative of household ambulator General Gait Details: Pt generally unsteady needing steadying assist throughout. Pt needed assist to steer Carley Hammed walker as well as needed cues for posture as he flexes forward quite a bit.  Pt fatigues and needs resting stand breaks as well as constant cues for pursed lip breathing.  Pt O2 on 6L was 94% at rest.  With activity, needed 15 LHFNC to keep sats >85%.  DOE 4/4 by end of walk and 2/4 at rest.  Pt reports feeling anxious and asking about his anxiety medication.   Stairs            Wheelchair Mobility    Modified Rankin (Stroke Patients Only)       Balance Overall balance assessment: Needs assistance Sitting-balance support: No upper extremity supported;Feet supported Sitting balance-[name]: Fair     Standing balance support: Bilateral upper extremity supported;During functional activity Standing balance-[name]: Poor Standing balance comment: relies on UE support for balance                             Pertinent Vitals/Pain Pain Assessment: No/denies pain    Home Living Family/patient expects to be discharged to:: Private residence Living Arrangements: Alone Available Help at Discharge: Family(2 sisters live out of town) Type of Home: House Home Access: Ramped entrance     Home Layout: One level Home Equipment: Toilet riser;Grab bars - tub/shower;Hand  held shower head      Prior Function Level of Independence: Independent         Comments: 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       Hand Dominance        Extremity/Trunk Assessment   Upper Extremity Assessment Upper Extremity Assessment: Defer to OT evaluation    Lower Extremity Assessment Lower  Extremity Assessment: Generalized weakness    Cervical / Trunk Assessment Cervical / Trunk Assessment: Normal  Communication   Communication: No difficulties  Cognition Arousal/Alertness: Awake/alert Behavior During Therapy: WFL for tasks assessed/performed Overall Cognitive Status: Within Functional Limits for tasks assessed                                        General Comments      Exercises General Exercises - Lower Extremity Ankle Circles/Pumps: AROM;Both;10 reps;Supine Heel Slides: AROM;Both;10 reps;Supine   Assessment/Plan    PT Assessment Patient needs continued PT services  PT Problem List Cardiopulmonary status limiting activity;Decreased knowledge of precautions;Decreased safety awareness;Decreased knowledge of use of DME;Decreased mobility;Decreased balance;Decreased activity tolerance       PT Treatment Interventions DME instruction;Gait training;Functional mobility training;Therapeutic activities;Therapeutic exercise;Balance training;Patient/family education    PT Goals (Current goals can be found in the Care Plan section)  Acute Rehab PT Goals Patient Stated Goal: to go home PT Goal Formulation: With patient Time For Goal Achievement: 11/03/18 Potential to Achieve Goals: Good    Frequency Min 3X/week   Barriers to discharge Decreased caregiver support      Co-evaluation               AM-PAC PT "6 Clicks" Mobility  Outcome Measure Help needed turning from your back to your side while in a flat bed without using bedrails?: A Lot Help needed moving from lying on your back to sitting on the side of a flat bed without using bedrails?: A Lot Help needed moving to and from a bed to a chair (including a wheelchair)?: A Lot Help needed standing up from a chair using your arms (e.g., wheelchair or bedside chair)?: A Lot Help needed to walk in hospital room?: A Lot Help needed climbing 3-5 steps with a railing? : A Lot 6 Click Score:  12    End of Session Equipment Utilized During Treatment: Gait belt;Oxygen Activity Tolerance: Patient limited by fatigue Patient left: with call bell/phone within reach;in bed;with bed alarm set Nurse Communication: Mobility status PT Visit Diagnosis: Unsteadiness on feet (R26.81);Muscle weakness (generalized) (M62.81)    Time: 1884-1660 PT Time Calculation (min) (ACUTE ONLY): 30 min   Charges:   PT Evaluation $PT Eval Moderate Complexity: 1 Mod PT Treatments $Gait Training: 8-22 mins        Myeisha Kruser,PT Acute Rehabilitation Services Pager:  [phone]  Office:  [phone]    Berline Lopes 10/20/2018, 3:37 PM

## 2018-10-20 NOTE — Progress Notes (Signed)
Patient ID: Justin Bradford., male   DOB: 01-30-1941, 78 y.o.   MRN: [id number]    Advanced Heart Failure Rounding Note   Subjective:    Patient is back in NSR this morning on IV amiodarone.   He is on norepinephrine 14 this morning with co-oc 73%.  SBP 120s currently.   Oxygen saturation 92% on HFNC.  CXR with diffuse airspace disease on right.   Afebrile, WBCs higher.  Remains on prednisone + ceftriaxone/azithromycin.  Blood cultures negative.    Objective:   Weight Range:  Vital Signs:   Temp:  [97.6 F (36.4 C)-98.6 F (37 C)] 98 F (36.7 C) (04/20 0727) Pulse Rate:  [70-116] 75 (04/20 0530) Resp:  [14-29] 26 (04/20 0530) BP: (69-125)/(48-79) 125/70 (04/20 0530) SpO2:  [88 %-95 %] 90 % (04/20 0530) Last BM Date: 10/18/18  Weight change: Filed Weights   10/15/18 1709 10/16/18 0036  Weight: 133.4 kg 132.1 kg    Intake/Output:   Intake/Output Summary (Last 24 hours) at 10/20/2018 0740 Last data filed at 10/20/2018 0500 Gross per 24 hour  Intake 2116.63 ml  Output 1350 ml  Net 766.63 ml     Physical Exam: General: Mild tachypnea Neck: JVP 8-9 cm, no thyromegaly or thyroid nodule.  Lungs: Crackles R>L CV: Nondisplaced PMI.  Heart regular S1/S2, no S3/S4, no murmur.  No peripheral edema.   Abdomen: Soft, nontender, no hepatosplenomegaly, no distention.  Skin: Intact without lesions or rashes.  Neurologic: Alert and oriented x 3.  Psych: Normal affect. Extremities: No clubbing or cyanosis.  HEENT: Normal.   Telemetry: NSR 70s Personally reviewed   Labs: Basic Metabolic Panel: Recent Labs  Lab 10/16/18 0250 10/17/18 0255 10/18/18 0259 10/19/18 0321 10/20/18 0325  NA 133* 136 132* 133* 135  K 3.9 3.6 3.5 3.7 3.1*  CL 98 96* 96* 98 104  CO2 21* 31 25 25  21*  GLUCOSE 171* 139* 134* 132* 237*  BUN 17 23 30* 29* 25*  CREATININE 1.13 1.44* 1.44* 1.44* 1.13  CALCIUM 8.6* 8.6* 8.3* 8.3* 7.0*  MG  --   --   --  2.3  --     Liver Function Tests:  Recent Labs  Lab 10/16/18 0250  AST 163*  ALT 37  ALKPHOS 75  BILITOT 1.2  PROT 7.0  ALBUMIN 3.9   No results for input(s): LIPASE, AMYLASE in the last 168 hours. No results for input(s): AMMONIA in the last 168 hours.  CBC: Recent Labs  Lab 10/15/18 2051  10/16/18 0843 10/16/18 2201 10/17/18 0255 10/18/18 0259 10/19/18 0321 10/20/18 0325  WBC 17.2*   < > 21.2*  --  22.3* 17.4* 15.6* 22.4*  NEUTROABS 15.0*  --   --   --   --   --   --   --   HGB 15.1   < > 15.3  --  14.6 13.4 12.1* 12.1*  HCT 46.3   < > 46.5  --  44.1 40.8 38.6* 37.0*  MCV 93.3   < > 92.6  --  93.8 93.2 93.9 92.7  PLT 291   < > 289 286 289 258 267 356   < > = values in this interval not displayed.    Cardiac Enzymes: Recent Labs  Lab 10/15/18 1722 10/15/18 2022 10/16/18 0250 10/16/18 0843 10/16/18 1608  TROPONINI 0.05* 1.40* 12.54* 15.50* >65.00*    BNP: BNP (last 3 results) No results for input(s): BNP in the last 8760 hours.  ProBNP (last 3  results) No results for input(s): PROBNP in the last 8760 hours.    Other results:  Imaging: Dg Chest Port 1 View  Result Date: 10/19/2018 CLINICAL DATA:  Shortness of breath. EXAM: PORTABLE CHEST 1 VIEW COMPARISON:  10/18/2018 FINDINGS: The cardiac silhouette remains mildly enlarged. They are worsening airspace opacities throughout the right lung. Left basilar airspace opacities are unchanged. Small pleural effusions are not excluded. No pneumothorax is identified. IMPRESSION: Worsening asymmetric airspace disease throughout the right lung. Electronically Signed   By: Sebastian AcheAllen  Grady M.D.   On: 10/19/2018 10:19   [place] Ekg Site Rite  Result Date: 10/19/2018 If Site Rite image not attached, placement could not be confirmed due to current cardiac rhythm.    Medications:     Scheduled Medications: . alum & mag hydroxide-simeth  30 mL Oral Once   And  . lidocaine  15 mL Oral Once  . arformoterol  15 mcg Nebulization BID  . aspirin  81 mg Oral  Daily  . atorvastatin  80 mg Oral q1800  . budesonide (PULMICORT) nebulizer solution  0.25 mg Nebulization BID  . Chlorhexidine Gluconate Cloth  6 each Topical Daily  . FLUoxetine  60 mg Oral Daily  . furosemide  80 mg Intravenous BID  . lamoTRIgine  75 mg Oral QHS  . pantoprazole  40 mg Oral Daily  . potassium chloride  40 mEq Oral BID  . potassium chloride  60 mEq Oral Once  . predniSONE  40 mg Oral Q breakfast  . sodium chloride flush  10-40 mL Intracatheter Q12H  . sodium chloride flush  3 mL Intravenous Q12H  . spironolactone  12.5 mg Oral Daily  . tamsulosin  0.4 mg Oral QHS  . ticagrelor  90 mg Oral BID    Infusions: . sodium chloride Stopped (10/17/18 0140)  . sodium chloride Stopped (10/19/18 1353)  . amiodarone 30 mg/hr (10/20/18 0507)  . azithromycin Stopped (10/19/18 1345)  . cefTRIAXone (ROCEPHIN)  IV Stopped (10/19/18 [id number])  . nitroGLYCERIN Stopped (10/16/18 1151)  . norepinephrine (LEVOPHED) Adult infusion 14 mcg/min (10/20/18 0508)    PRN Medications: sodium chloride, acetaminophen, albuterol, morphine injection, ondansetron (ZOFRAN) IV, sodium chloride flush, sodium chloride flush    Coronary Diagrams   Diagnostic  Dominance: Right    Intervention     Assessment:   Justin PoseyBruce D Carlucci Jr. is a 78 y.o. male with a hx of HTN, HLD, BPH and obesitywho was admitted with NSTEMI and found to have totally occluded LAD s/p DES on 4/16. Post MI care complicated by acute systolic HF with EF 30%   Plan/Discussion:     1.  CAD/NSTEMI: Peak trop > 65. Cath 4/16 with total occlusion of LAD -> s/p DES (55% residual stenosis due to heavy calcification).  Residual CAD with LCx 60 and RCA 20%. EF 30% with LVEDP 27.  - Continue DAPT.  - Continue atorvastatin 80 daily.  - Out of bed.  2. Acute systolic HF: Due to LAD infarct. Echo 10/16/18 EF 30-35%. CVP 10 this morning with co-ox 73%.  I do not think CHF explains his hypoxemia but will aim to keep his lungs as dry as  possible.  Creatinine 1.13 this morning.  He is currently on norepinephrine but SBP in 120s.  - Hold Coreg, losartan, and spironolactone while on norepinephrine.  - Lasix 80 mg IV bid today and replace K.  - Wean down norepinephrine.  3. Acute hypoxic respiratory failure: On high flow [place] currently.  Has COPD but  not on oxygen at home.  CXR with asymmetric diffuse right-sided airspace disease.  WBCs 22 (higher but on prednisone).  Afebrile.  ?Aspiration, ?viral pneumonitis.  COVID-19 negative x 2, viral panel negative, blood cultures NGTD.  - Send sputum culture.  - Continue ceftriaxone/azithromycin. - pulmonary following.  - He remains on prednisone 40 mg daily for total 4 days for possible COPD contribution.  4.  COPD:  Recently evaluated by pulmonary medicine and is now treated for COPD with Breo and Ventolin. Not on home oxygen.  - Management per IM/pulmonary.  5. AKI: ? Shock vs CIN vs diuresis. Resolved.  6. Hypokalemia: Replace K.  7. Atrial fibrillation: First noted on 4/19, now back in NSR today on amiodarone gtt.  He is also now on heparin gtt (+ ASA 81 and ticagrelor).  Given only brief atrial fibrillation and need for DAPT, would not commit him to triple therapy yet.  Would continue heparin gtt for now, follow him for recurrent atrial fibrillation.  - Continue amiodarone (1 month if no further atrial fibrillation).  - Heparin gtt for now, transition to Eliquis + Plavix if further atrial fibrillation noted.   CRITICAL CARE Performed by: Marca Ancona  Total critical care time: 35 minutes  Critical care time was exclusive of separately billable procedures and treating other patients.  Critical care was necessary to treat or prevent imminent or life-threatening deterioration.  Critical care was time spent personally by me (independent of midlevel providers or residents) on the following activities: development of treatment plan with patient and/or surrogate as well as nursing,  discussions with consultants, evaluation of patient's response to treatment, examination of patient, obtaining history from patient or surrogate, ordering and performing treatments and interventions, ordering and review of laboratory studies, ordering and review of radiographic studies, pulse oximetry and re-evaluation of patient's condition.   Length of Stay: 5  Marca Ancona MD 10/20/2018, 7:40 AM  Advanced Heart Failure Team Pager [phone] (M-F; 7a - 4p)  Please contact [facility] for night-coverage after hours (4p -7a ) and weekends on [email or web address]

## 2018-10-20 NOTE — Progress Notes (Signed)
Rehab Admissions Coordinator Note:  Patient was screened by Clois Dupes for appropriateness for an Inpatient Acute Rehab Consult per PT recommendations.   At this time, we are recommending Inpatient Rehab consult. Please place order for consult.  Clois Dupes RN MSN 10/20/2018, 3:55 PM  I can be reached at [phone].

## 2018-10-20 NOTE — Progress Notes (Signed)
ANTICOAGULATION CONSULT NOTE - Follow-Up Consult  Pharmacy Consult for Heparin Indication: atrial fibrillation  No Known Allergies  Patient Measurements: Height: 6' (182.9 cm) Weight: 291 lb 3.6 oz (132.1 kg) IBW/kg (Calculated) : 77.6 HEPARIN DW (KG): 107.9   Vital Signs: Temp: 98 F (36.7 C) (04/20 0727) Temp Source: Oral (04/20 0727) BP: 125/78 (04/20 0730) Pulse Rate: 77 (04/20 0730)  Labs: Recent Labs    10/18/18 0259 10/19/18 0321 10/19/18 2034 10/20/18 0325  HGB 13.4 12.1*  --  12.1*  HCT 40.8 38.6*  --  37.0*  PLT 258 267  --  356  HEPARINUNFRC  --   --  0.14*  --   CREATININE 1.44* 1.44*  --  1.13    Estimated Creatinine Clearance: 78.2 mL/min (by C-G formula based on SCr of 1.13 mg/dL).   Medical History: Past Medical History:  Diagnosis Date  . Hypertension   . Peripheral neuropathy     Medications:  Scheduled:  . alum & mag hydroxide-simeth  30 mL Oral Once   And  . lidocaine  15 mL Oral Once  . arformoterol  15 mcg Nebulization BID  . aspirin  81 mg Oral Daily  . atorvastatin  80 mg Oral q1800  . budesonide (PULMICORT) nebulizer solution  0.25 mg Nebulization BID  . Chlorhexidine Gluconate Cloth  6 each Topical Daily  . FLUoxetine  60 mg Oral Daily  . furosemide  80 mg Intravenous BID  . lamoTRIgine  75 mg Oral QHS  . pantoprazole  40 mg Oral Daily  . potassium chloride  40 mEq Oral BID  . potassium chloride  60 mEq Oral Once  . sodium chloride flush  10-40 mL Intracatheter Q12H  . sodium chloride flush  3 mL Intravenous Q12H  . spironolactone  12.5 mg Oral Daily  . tamsulosin  0.4 mg Oral QHS  . ticagrelor  90 mg Oral BID   Infusions:  . sodium chloride Stopped (10/17/18 0140)  . sodium chloride Stopped (10/19/18 1353)  . amiodarone 30 mg/hr (10/20/18 0700)  . azithromycin Stopped (10/19/18 1345)  . calcium gluconate    . cefTRIAXone (ROCEPHIN)  IV Stopped (10/19/18 1962)  . heparin    . nitroGLYCERIN Stopped (10/16/18 1151)  .  norepinephrine (LEVOPHED) Adult infusion 14 mcg/min (10/20/18 0700)    Assessment: Pt is a 77yoM with new onset afib with RVR. Pharmacy is consulted to start heparin drip. CBC relatively stable. Pt converted to NSR on amio gtt. Patient also on asa/brilinta planning to continue heparin for now, with transitioning to eliquis/plavix if futher afib noted   Heparin level subtherapeutic at 0.25 this morning with infusion running 1850 units/hr.   Goal of Therapy:  Heparin level 0.3-0.7 units/ml Monitor platelets by anticoagulation protocol: Yes   Plan:  -Increase heparin infusion to 2050 units/hr -Recheck heparin level in 8 hr -Daily heparin level/ CBC  Ewing Schlein, PharmD PGY1 Pharmacy Resident 10/20/2018    9:42 AM Please check AMION for all [facility] numbers

## 2018-10-20 NOTE — Progress Notes (Addendum)
NAME:  Justin Bradford., MRN:  [id number], DOB:  1941/04/24, LOS: 5 ADMISSION DATE:  10/15/2018, CONSULTATION DATE:  10/19/18 REFERRING MD:  Dr. Gala Romney CHIEF COMPLAINT:  Shortness of breath   Brief History   78yo male who presented with PMH of COPD, HTN, HLD, BPH, T2DM who presented with chest pain and shortness of breath and was treated for LAD occlusion with DES stent, HF exacerbation and CAP without improvement in dyspnea or hypoxia.  History of present illness   78yo male who presented with chest pain associated with dyspnea, malaise and nausea on 10/15/18 found to have leukocytosis and pulmonary infiltrates concerning for pneumonia. Troponins peaked >65; he is s/p cath which found 100% LAD occlusion s/p DES stenting. EF is 30-35%; RV with normal systolic function though unable to estimate pressures; valves not well visualized; IVC with >50% repirophasic variability. CTA negative for acute PE; ground-glass changes noted worse on RUL posteriorly. He has been diuresed, covered for CAP with azithromycin and ceftriaxone, and maintained on his home COPD medications. Despite this treatment he is still dyspneic, with ongoing pulmonary infiltrate and requiring 5-6L HFNC.  Past Medical History  HTN, T2DM, HLD, BPH, labeled COPD (PFTs not diagnostic for COPD), tobacco use  Significant Hospital Events   4/15 Admitted 4/16 LHC reveals 100% LAD stenosis  Consults:  HF PCCM  Procedures:  4/16 LHC 100% LAD occlusion s/p DES; 60% circ stenosis  Significant Diagnostic Tests:  4/15 CXR bilateral basilar atelectasis vs infiltrate 4/15 CTA - no acute PE, right sided ground-glass opacities 4/16 Echo EF 30-35% 4/16 CXR increased R>L pulmonary opacities 4/19 CXR ongoing bilateral pulmonary infiltrates   Micro Data:  COVID-19 negative x2 Resp panel 4/16 negative Step pneumo 4/15 negative Legionella 4/15 negative BCx 4/15 NGTD  Antimicrobials:  Azithromycin 4/15> Ceftriaxone 4/15>  Interim  history/subjective:  Still requiring 6L HFNC; he feels his underlying anxiety is making it more difficult for him to sleep and affecting his breathing. He states he takes clonazepam prn at home.  Objective   Blood pressure 125/78, pulse 77, temperature 98 F (36.7 C), temperature source Oral, resp. rate (!) 22, height 6' (1.829 m), weight 132.1 kg, SpO2 95 %. CVP:  [5 mmHg-21 mmHg] 15 mmHg      Intake/Output Summary (Last 24 hours) at 10/20/2018 4098 Last data filed at 10/20/2018 0700 Gross per 24 hour  Intake 2553.08 ml  Output 1150 ml  Net 1403.08 ml   Filed Weights   10/15/18 1709 10/16/18 0036  Weight: 133.4 kg 132.1 kg    Examination: General: elderly gentleman resting in bed, mild anxiety HENT: anicteric sclera, unable to appreciate JVD Lungs: pursed lip breathing, speaking in short phrases, inspiratory crackles throughout, no wheezing Cardiovascular: RRR, no m/r/g appreciated, warm extremities with pulses intact Abdomen: soft, obese, nontender Extremities: warm, no clubbing of digits Neuro: mild right hand resting tremor that's slightly worse this morning. Skin: warm, dry w/o rashes  Resolved Hospital Problem list     Assessment & Plan:  Hypoxic respiratory failure with R>L pulmonary infiltrates: Likely multifactorial in setting of long standing tobacco use, HFrEF, and possible aspirations based on bibasilar infiltrates. Oxygen requirement remains unchanged this morning. -continue diuresis; would recommend increasing dosing frequency as he is still net positive for the last 24hrs but had adequate initial diuresis with dose of lasix yesterday -continue CAP coverage for 5 day total duration -continue with 5d course of prednisone -continue COPD nebs -wean O2 with goal 88-92% -IS and flutter valve -ambulate  and sit up in chair as able  CAD NSTEMI s/p LAD DES: Peak troponin >65; cath on 4/16 revealed 100% LAD stenosis as well as 60% LCx and 20% RCA; he is s/p LAD DES  with residual 55% stenosis.  -continue medical therapy with DAPT, statin, BB, Entresto  Acute systolic CHF in setting of NSTEMI: EF 30-35% on recent echo; inadequate evaluation of valves.   -continue diuresis -continue entresto, spironolactone, BB  Labeled COPD, former tobacco user: FEV1/FVC 73, on recent PFTs in March. -changed inhalers to nebs  PAF: New this morning. Started on amiodarone and heparin today.   AKI: In setting of contrast and starting entresto; improved this morning. Continue monitoring.  Best practice:  Diet: HH Pain/Anxiety/Delirium protocol (if indicated): n/a VAP protocol (if indicated): n/a DVT prophylaxis: heparin GI prophylaxis: n/a Glucose control: n/a Mobility: up with assistance Code Status: FULL Family Communication: able to discuss with patient  Disposition: ICU   Labs   CBC: Recent Labs  Lab 10/15/18 2051  10/16/18 0843 10/16/18 2201 10/17/18 0255 10/18/18 0259 10/19/18 0321 10/20/18 0325  WBC 17.2*   < > 21.2*  --  22.3* 17.4* 15.6* 22.4*  NEUTROABS 15.0*  --   --   --   --   --   --   --   HGB 15.1   < > 15.3  --  14.6 13.4 12.1* 12.1*  HCT 46.3   < > 46.5  --  44.1 40.8 38.6* 37.0*  MCV 93.3   < > 92.6  --  93.8 93.2 93.9 92.7  PLT 291   < > 289 286 289 258 267 356   < > = values in this interval not displayed.    Basic Metabolic Panel: Recent Labs  Lab 10/16/18 0250 10/17/18 0255 10/18/18 0259 10/19/18 0321 10/20/18 0325  NA 133* 136 132* 133* 135  K 3.9 3.6 3.5 3.7 3.1*  CL 98 96* 96* 98 104  CO2 21* 21*  GLUCOSE 171* 139* 134* 132* 237*  BUN 17 23 30* 29* 25*  CREATININE 1.13 1.44* 1.44* 1.44* 1.13  CALCIUM 8.6* 8.6* 8.3* 8.3* 7.0*  MG  --   --   --  2.3  --    GFR: Estimated Creatinine Clearance: 78.2 mL/min (by C-G formula based on SCr of 1.13 mg/dL). Recent Labs  Lab 10/15/18 2050  10/17/18 0255 10/18/18 0259 10/19/18 0321 10/20/18 0325  PROCALCITON  --   --  0.38  --   --   --   WBC  --     < > 22.3* 17.4* 15.6* 22.4*  LATICACIDVEN 1.7  --   --   --   --   --    < > = values in this interval not displayed.    Liver Function Tests: Recent Labs  Lab 10/16/18 0250  AST 163*  ALT 37  ALKPHOS 75  BILITOT 1.2  PROT 7.0  ALBUMIN 3.9   No results for input(s): LIPASE, AMYLASE in the last 168 hours. No results for input(s): AMMONIA in the last 168 hours.  ABG    Component Value Date/Time   HCO3 23.7 10/16/2018 0255   ACIDBASEDEF 1.1 10/16/2018 0255   O2SAT 73.4 10/20/2018 0340     Coagulation Profile: No results for input(s): INR, PROTIME in the last 168 hours.  Cardiac Enzymes: Recent Labs  Lab 10/15/18 1722 10/15/18 2022 10/16/18 0250 10/16/18 0843 10/16/18 1608  TROPONINI 0.05* 1.40* 12.54* 15.50* >65.00*  HbA1C: No results found for: HGBA1C  CBG: No results for input(s): GLUCAP in the last 168 hours.  Review of Systems:   Patient states he has increased anxiety; his breathing is a little worse this morning but reports his cough is improving.  Past Medical History  He,  has a past medical history of Hypertension and Peripheral neuropathy.   Surgical History    Past Surgical History:  Procedure Laterality Date  . CORONARY STENT INTERVENTION N/A 10/16/2018   Procedure: CORONARY STENT INTERVENTION;  Surgeon: Lennette Bihari, MD;  Location: [facility] INVASIVE CV LAB;  Service: Cardiovascular;  Laterality: N/A;  . KNEE SURGERY    . LEFT HEART CATH AND CORONARY ANGIOGRAPHY N/A 10/16/2018   Procedure: LEFT HEART CATH AND CORONARY ANGIOGRAPHY;  Surgeon: Lennette Bihari, MD;  Location: [facility] INVASIVE CV LAB;  Service: Cardiovascular;  Laterality: N/A;     Social History   reports that he has quit smoking. He has never used smokeless tobacco. He reports current alcohol use. He reports that he does not use drugs.   Family History   His family history is not on file.   Allergies No Known Allergies   Home Medications  Prior to Admission medications    Medication Sig Start Date End Date Taking? Authorizing Provider  aspirin EC 81 MG tablet Take 81 mg by mouth daily.   Yes [provider]  BREO ELLIPTA 100-25 MCG/INH AEPB Inhale 1 puff into the lungs daily. 09/25/18  Yes [provider]  FLUoxetine (PROZAC) 20 MG capsule Take 60 mg by mouth every morning. 01/28/17  Yes [provider]  hydrochlorothiazide (HYDRODIURIL) 50 MG tablet Take 50 mg by mouth every morning.    Yes [provider]  lamoTRIgine (LAMICTAL) 25 MG tablet Take 75 mg by mouth at bedtime. 01/28/17  Yes [provider]  losartan (COZAAR) 25 MG tablet Take 50 mg by mouth every morning. 08/16/18  Yes [provider]  metFORMIN (GLUCOPHAGE) 500 MG tablet Take 250 mg by mouth every morning.    Yes [provider]  omeprazole (PRILOSEC) 20 MG capsule Take 20 mg by mouth every morning. 05/10/18  Yes [provider]  simvastatin (ZOCOR) 40 MG tablet Take 40 mg by mouth every morning.    Yes [provider]  spironolactone (ALDACTONE) 25 MG tablet Take 25 mg by mouth 2 (two) times daily.    Yes [provider]  tamsulosin (FLOMAX) 0.4 MG CAPS capsule Take 0.4 mg by mouth at bedtime.    Yes [provider]  VENTOLIN HFA 108 (90 Base) MCG/ACT inhaler Inhale 1 puff into the lungs every 4 (four) hours as needed for wheezing. 09/11/18  Yes [provider]  metoprolol succinate (TOPROL-XL) 50 MG 24 hr tablet Take 50 mg by mouth daily. 05/10/18   [provider]     Nyra Market, MD PGY3 Pager [phone]   ____________________________________________________________________________  PCCM Attending:   78 yo M, BL infiltrates, SOB, h/o copd, HLD, s/p ACS and LAD DES, new EF 30%. Some diuresis overnight. Now hypotension on NEpi   BP 125/78   Pulse 77   Temp 98 F (36.7 C) (Oral)   Resp (!) 22   Ht 6' (1.829 m)   Wt 132.1 kg   SpO2 95%   BMI 39.50 kg/m    Gen: elderly male, comfortable, up in chair, but dyspenic to talk  Heart: RRR. s1 s2  Lungs: More clear today, few crackles   CXR:  maybe some slight improvement, more dense in the right base, BL infiltrates persist, The patient's images have been independently reviewed by me.    Labs reviewed WBC up, I suspect from steroids, still no fevers  A: AHRF, bl infiltrates, ?pneumonitis, ?alveolar hemorrhage (fully ac on heparin ggt)  ACS s/p DES A on C Systolic heart failure  Cardiogenic shock  Afib with RVR on amio  Prior PFTs reviewed, mixed pattern, no severe obstruction, DLCO normal  Chronic alcohol use  Tremor in hands  P:  Continued diuresis  I agree with cardiology he does have an ongoing pulmonary process. I am unsure the etiology its likely multifactorial from all of the things that have happened to him  I think the best would be to continue aggressive pulmonary support care and pulmonary tioleting We will complete a short course of steroids Watch H&H  Continue nebs  Up in chair today  PT/OT  Would like to see him walk if possible  We will follow along Titrate Nepi to maintain MAP >6865mmHg  This patient is critically ill with multiple organ system failure; which, requires frequent high complexity decision making, assessment, support, evaluation, and titration of therapies. This was completed through the application of advanced monitoring technologies and extensive interpretation of multiple databases. During this encounter critical care time was devoted to patient care services described in this note for 34 minutes.   Josephine IgoBradley L Kennadi Albany, DO [facility] 10/20/2018 9:26 AM  Personal pager: [phone] If unanswered, please page CCM On-call: #[phone]

## 2018-10-20 NOTE — Progress Notes (Addendum)
Subjective: HD#5   Overnight: RN stated he was more restless overnight and appeared dyspneic. His HFNC was increased to 8 lmp.   Today he states it was a rough night as he was unable to get comfortable. He was finally able to sleep for an hour. He states he has been taking klonopin for at least 5-6 years, ever since his wife passed away. He states that he can take up to 3 pills a day but usually only takes one pill in the morning. Currently, he feels very shaky and anxious. He denies sob at rest but when he starts talking, he feels difficulty breathing. Denies chest pain. Appetite has been okay. He doesn't like hospital food very much.   Objective:  Vital signs in last 24 hours: Vitals:   10/20/18 0400 10/20/18 0430 10/20/18 0500 10/20/18 0530  BP: 119/73 102/76 123/75 125/70  Pulse: 74 75 70 75  Resp: (!) 27 (!) 25 (!) 27 (!) 26  Temp:      TempSrc:      SpO2: 91% 90% 91% 90%  Weight:      Height:       Const: Lying in bed, dyspneic with speech as he cannot complete a full sentence Resp: Bilateral crackles posteriorly up to mid lobe no wheezes CV: RRR, no murmurs, gallops, rubs Abd: Bowel sounds present  Assessment/Plan:  Principal Problem:   Acute anterior myocardial infarction (HCC) Active Problems:   Chest pain   COPD exacerbation (HCC)   Atypical pneumonia   Elevated troponin   Acute systolic heart failure (HCC)   Status post coronary artery stent placement  Mr.Suitt is a 78 yo M w/ PMH of HTN, HLD, T2DM, BPH, obesity who presented with acute onsetchest pain found to have Acute anterior MI with 100% occlusion of proximal LED s/p DES. Also here for management of Acute HFrEF, Hypoxic respiratory failure, CAP and A-Fibb.  #Acute anterior MI: This morning he was noted to be dyspneic while talking and was not able to complete a full sentence without experiencing shortness of breath.  Clinically he is noted to be somewhat worse from his assessment yesterday. Co-ox this  morning reassuring at 73 from 63 yesterday. - Continue aspirin 81 mg daily, Brilinta - Continue Lipitor 80 mg daily - Hold Coreg while on Levophed - Appreciate cardiology recommendation  #Acute HFrEF: Physical exams was noticeable for bilateral posterior crackles on lung examination which denotes he is still volume overloaded.  Chest x-ray this a.m. shows stable pulmonary infiltrates.  Total urine output of 1.3 L with net +766 cc. - IV Lasix 80 mg BID - Strict intake and output - Continue to hold Coreg, spironolactone and losartan while on pressors (Levophed) per cardiology - Continue cardiac monitoring  #New onset A. fib with RVR: He was noted to be in sinus rhythm on observation of the monitor in his room. - Continue amiodarone infusion and heparin  #Acute hypoxic respiratory failure: Though he has remained afebrile, leukocytosis has trended up this morning to 22 from 15 yesterday.  This is most likely due to initiation of steroids yesterday.  Currently oxygen saturation is 91% on 8L high flow nasal cannula.  He has remained afebrile and checks x-ray without any evidence of wheezes or focalized rails making either community-acquired pneumonia or COPD exacerbation less likely.  With this, prednisone will be discontinued.  His deteriorated respiratory function is slightly due to the episode of A. fib with RVR yesterday which induced flash pulmonary edema. Viral myocarditis and  COPD (although no documented wheezes and PFTs performed in March showed FEV1/FVC ratio of 73%).   - Continue Rocephin (day 3), azithromycin (day 3) - Continue nebulizer treatment  #Acute kidney injury: Resolved.  Thought to be secondary to prerenal from hypoperfusion. - Serum creatinine this morning of 1.1<1.4 -Continue to monitor  #Parkinsonian features: -Continue lamotrigine and fluoxetine  #COPD - Continue nebulizers with pulmicort, brovana   #Hypertension -BP stable this morning at 125/70 on Levophed infusion  -Hold Coreg, spironolactone and losartan while on pressors (Levophed)   #Hypokalemia: K+ this morning of 3.1. -s/p K-Dur 60 mEq  #Generalized anxiety disorder: He tells me today that he has been suffering from anxiety disorder for at least 6 years and usually takes clonazepam 1-3 times per day.  Per review of the narcotic database, he receives clonazepam 1 mg #90 and was last filled in November 2019. - Clonazepam 1 mg now then qhs  FEN: Replace electrolytes as needed, heart healthy diet VTE ppx: Heparin CODE STATUS: Full code  Dispo: Anticipated discharge in approximately 3-4 day(s).   Yvette Rack, MD 10/20/2018, 5:57 AM Pager: [phone] IMTS PGY-1

## 2018-10-20 NOTE — Progress Notes (Signed)
1200 Noted that pt has PT/OT order to help with increasing activity. Will check by phone with pt closer to discharge to see if any questions re ed done last week. Pt has materials in room. Luetta Nutting RN BSN 10/20/2018 12:11 PM

## 2018-10-21 DIAGNOSIS — I110 Hypertensive heart disease with heart failure: Secondary | ICD-10-CM

## 2018-10-21 DIAGNOSIS — R739 Hyperglycemia, unspecified: Secondary | ICD-10-CM

## 2018-10-21 DIAGNOSIS — E119 Type 2 diabetes mellitus without complications: Secondary | ICD-10-CM

## 2018-10-21 DIAGNOSIS — D72829 Elevated white blood cell count, unspecified: Secondary | ICD-10-CM

## 2018-10-21 DIAGNOSIS — Z9981 Dependence on supplemental oxygen: Secondary | ICD-10-CM

## 2018-10-21 DIAGNOSIS — E669 Obesity, unspecified: Secondary | ICD-10-CM

## 2018-10-21 DIAGNOSIS — E1169 Type 2 diabetes mellitus with other specified complication: Secondary | ICD-10-CM

## 2018-10-21 DIAGNOSIS — I2102 ST elevation (STEMI) myocardial infarction involving left anterior descending coronary artery: Secondary | ICD-10-CM | POA: Diagnosis present

## 2018-10-21 DIAGNOSIS — E871 Hypo-osmolality and hyponatremia: Secondary | ICD-10-CM

## 2018-10-21 DIAGNOSIS — I10 Essential (primary) hypertension: Secondary | ICD-10-CM

## 2018-10-21 DIAGNOSIS — Z9911 Dependence on respirator [ventilator] status: Secondary | ICD-10-CM

## 2018-10-21 DIAGNOSIS — T380X5A Adverse effect of glucocorticoids and synthetic analogues, initial encounter: Secondary | ICD-10-CM

## 2018-10-21 DIAGNOSIS — Z720 Tobacco use: Secondary | ICD-10-CM

## 2018-10-21 LAB — MAGNESIUM: Magnesium: 2 mg/dL (ref 1.7–2.4)

## 2018-10-21 LAB — CBC
HCT: 39.2 % (ref 39.0–52.0)
Hemoglobin: 12.9 g/dL — ABNORMAL LOW (ref 13.0–17.0)
MCH: 30.3 pg (ref 26.0–34.0)
MCHC: 32.9 g/dL (ref 30.0–36.0)
MCV: 92 fL (ref 80.0–100.0)
Platelets: 327 10*3/uL (ref 150–400)
RBC: 4.26 MIL/uL (ref 4.22–5.81)
RDW: 13.3 % (ref 11.5–15.5)
WBC: 17 10*3/uL — ABNORMAL HIGH (ref 4.0–10.5)
nRBC: 0 % (ref 0.0–0.2)

## 2018-10-21 LAB — BASIC METABOLIC PANEL
Anion gap: 14 (ref 5–15)
BUN: 30 mg/dL — ABNORMAL HIGH (ref 8–23)
CO2: 25 mmol/L (ref 22–32)
Calcium: 8.3 mg/dL — ABNORMAL LOW (ref 8.9–10.3)
Chloride: 94 mmol/L — ABNORMAL LOW (ref 98–111)
Creatinine, Ser: 1.37 mg/dL — ABNORMAL HIGH (ref 0.61–1.24)
GFR calc Af Amer: 58 mL/min — ABNORMAL LOW (ref >60)
GFR calc non Af Amer: 50 mL/min — ABNORMAL LOW (ref >60)
Glucose, Bld: 165 mg/dL — ABNORMAL HIGH (ref 70–99)
Potassium: 3.5 mmol/L (ref 3.5–5.1)
Sodium: 133 mmol/L — ABNORMAL LOW (ref 135–145)

## 2018-10-21 LAB — HEPARIN LEVEL (UNFRACTIONATED): Heparin Unfractionated: 0.48 IU/mL (ref 0.30–0.70)

## 2018-10-21 LAB — COOXEMETRY PANEL
Carboxyhemoglobin: 1.2 % (ref 0.5–1.5)
Methemoglobin: 0.9 % (ref 0.0–1.5)
O2 Saturation: 65.9 %
Total hemoglobin: 13.5 g/dL (ref 12.0–16.0)

## 2018-10-21 MED ORDER — SPIRONOLACTONE 12.5 MG HALF TABLET: 12.5000 mg | ORAL_TABLET | Freq: Every day | ORAL | Status: DC

## 2018-10-21 MED ORDER — TORSEMIDE 20 MG PO TABS: 40.0000 mg | ORAL_TABLET | Freq: Every day | ORAL | Status: DC

## 2018-10-21 MED ORDER — FUROSEMIDE 40 MG PO TABS
40.0000 mg | ORAL_TABLET | Freq: Every day | ORAL | Status: DC
  Administered 2018-10-21 – 2018-10-22 (×2): 40 mg via ORAL
  Filled 2018-10-21 (×2): qty 1

## 2018-10-21 MED ORDER — AMIODARONE HCL 200 MG PO TABS
200.0000 mg | ORAL_TABLET | Freq: Two times a day (BID) | ORAL | Status: DC
  Administered 2018-10-21 – 2018-10-26 (×12): 200 mg via ORAL
  Filled 2018-10-21 (×14): qty 1

## 2018-10-21 MED ORDER — CARVEDILOL 3.125 MG PO TABS
3.1250 mg | ORAL_TABLET | Freq: Two times a day (BID) | ORAL | Status: DC
  Administered 2018-10-21 – 2018-10-27 (×13): 3.125 mg via ORAL
  Filled 2018-10-21 (×13): qty 1

## 2018-10-21 MED ORDER — POTASSIUM CHLORIDE CRYS ER 20 MEQ PO TBCR
40.0000 meq | EXTENDED_RELEASE_TABLET | Freq: Two times a day (BID) | ORAL | Status: AC
  Administered 2018-10-21 (×2): 40 meq via ORAL
  Filled 2018-10-21 (×2): qty 2

## 2018-10-21 NOTE — Consult Note (Signed)
Physical Medicine and Rehabilitation Consult Reason for Consult:  Decreased functional mobility Referring Physician: internal medicine   HPI: Justin Bradford. is a 78 y.o.right handed male with history of hypertension, type 2 diabetes mellitus, tobacco abuse.  Per chart review and patient, patient was independent prior to admission with no local family in the area. One level home with ramped entrance. He has 2 sisters who live out-of-town. 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  He presented on 10/15/2018 with chest pain and shortness of breath.  Work-up revealed troponin 0.05, lactic acid 1.7, potassium 3.4, WBC 16,600-24,600. Chest x-ray COPD without acute airspace disease. CT angiogram of the chest no evidence of pulmonary emboli. ECG showing mild ST elevation elevation in anterior leads.  Echocardiogram with ejection fraction of 35% moderate to severe reduced systolic function. Cardiac catheterization 10/16/2018 with total occlusion LAD. Required norepinephrine for blood pressure control IV Lasix for diuresis. Maintained on IV heparin transitioning to Brilinta. Hospital course compromised by hypoxic respiratory failure with right greater than left pulmonary infiltrates, suspected CAP coverage 5 day total antibiotics.  Slow wean from oxygen therapy, however you notes symptomatic improvement with nebulizer treatments. Tolerating a regular diet. Therapy evaluations completed with recommendations of physical medicine rehabilitation consult.   Review of Systems  Constitutional: Positive for malaise/fatigue. Negative for chills.  HENT: Negative for hearing loss.   Eyes: Negative for blurred vision and double vision.  Respiratory: Positive for shortness of breath.   Gastrointestinal: Positive for constipation. Negative for heartburn and nausea.  Genitourinary: Positive for urgency. Negative for dysuria, flank pain and hematuria.  Musculoskeletal: Positive for  myalgias.  Skin: Negative for rash.  Neurological: Positive for weakness. Negative for seizures.  All other systems reviewed and are negative.  Past Medical History:  Diagnosis Date   Hypertension    Peripheral neuropathy    Past Surgical History:  Procedure Laterality Date   CORONARY STENT INTERVENTION N/A 10/16/2018   Procedure: CORONARY STENT INTERVENTION;  Surgeon: Troy Sine, MD;  Location: [facility];  Service: Cardiovascular;  Laterality: N/A;   KNEE SURGERY     LEFT HEART CATH AND CORONARY ANGIOGRAPHY N/A 10/16/2018   Procedure: LEFT HEART CATH AND CORONARY ANGIOGRAPHY;  Surgeon: Troy Sine, MD;  Location: [facility];  Service: Cardiovascular;  Laterality: N/A;   No pertinent family history of premature CAD Social History:  reports that he has quit smoking. He has never used smokeless tobacco. He reports current alcohol use. He reports that he does not use drugs. Allergies: No Known Allergies Medications Prior to Admission  Medication Sig Dispense Refill   aspirin EC 81 MG tablet Take 81 mg by mouth daily.     BREO ELLIPTA 100-25 MCG/INH AEPB Inhale 1 puff into the lungs daily.     FLUoxetine (PROZAC) 20 MG capsule Take 60 mg by mouth every morning.     hydrochlorothiazide (HYDRODIURIL) 50 MG tablet Take 50 mg by mouth every morning.      lamoTRIgine (LAMICTAL) 25 MG tablet Take 75 mg by mouth at bedtime.     losartan (COZAAR) 25 MG tablet Take 50 mg by mouth every morning.     metFORMIN (GLUCOPHAGE) 500 MG tablet Take 250 mg by mouth every morning.      omeprazole (PRILOSEC) 20 MG capsule Take 20 mg by mouth every morning.     simvastatin (ZOCOR) 40 MG tablet Take 40 mg by mouth every morning.  spironolactone (ALDACTONE) 25 MG tablet Take 25 mg by mouth 2 (two) times daily.      tamsulosin (FLOMAX) 0.4 MG CAPS capsule Take 0.4 mg by mouth at bedtime.      VENTOLIN HFA 108 (90 Base) MCG/ACT inhaler Inhale 1 puff into the lungs  every 4 (four) hours as needed for wheezing.     metoprolol succinate (TOPROL-XL) 50 MG 24 hr tablet Take 50 mg by mouth daily.      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   Functional Status:  Mobility: [place] bed mobility: Needs Assistance Bed Mobility: Supine to Sit Supine to sit: Min assist, Mod assist General bed mobility comments: Needed assist for LEs and for elevation of trunk.  Transfers Overall transfer level: Needs assistance Equipment used: (EVA walker) Transfers: Sit to/from Stand Sit to Stand: Mod assist, +2 physical assistance General transfer comment: Pt needed assist to power up and cues for hand placement Ambulation/Gait Ambulation/Gait assistance: Mod assist, +2 safety/equipment Gait Distance (Feet): 120 Feet Assistive device: (EVA walker) Gait Pattern/deviations: Step-through pattern, Decreased stride length, Trunk flexed, Wide base of support, Staggering right, Staggering left, Leaning posteriorly General Gait Details: Pt generally unsteady needing steadying assist throughout. Pt needed assist to steer [place] walker as well as needed cues for posture as he flexes forward quite a bit.  Pt fatigues and needs resting stand breaks as well as constant cues for pursed lip breathing.  Pt O2 on 6L was 94% at rest.  With activity, needed 15 LHFNC to keep sats >85%.  DOE 4/4 by end of walk and 2/4 at rest.  Pt reports feeling anxious and asking about his anxiety medication.  Gait  velocity interpretation: <1.31 ft/sec, indicative of household ambulator    ADL:    Cognition: Cognition Overall Cognitive Status: Within Functional Limits for tasks assessed Orientation Level: Oriented X4 Cognition Arousal/Alertness: Awake/alert Behavior During Therapy: WFL for tasks assessed/performed Overall Cognitive Status: Within Functional Limits for tasks assessed  Blood pressure 105/77, pulse 98, temperature 98 F (36.7 C), temperature source Oral, resp. rate (!) 24, height 6' (1.829 m), weight 128.7 kg, SpO2 92 %. Physical Exam  Vitals reviewed. Constitutional: He is oriented to person, place, and time. He appears well-developed.  Obese  HENT:  Head: Normocephalic and atraumatic.  Eyes: EOM are normal.  Respiratory: Effort normal.  + [place]  GI: He exhibits no distension.  Musculoskeletal:     Comments: No edema or tenderness in extremities  Neurological: He is alert and oriented to person, place, and time.  Follows full commands Motor: Grossly 4-/5 throughout  Skin: Skin is warm and dry.  Psychiatric: He has a normal mood and affect. His behavior is normal.    Results for orders placed or performed during the hospital encounter of 10/15/18 (from the past 24 hour(s))  Sedimentation rate     Status: Abnormal   Collection Time: 10/20/18  2:33 PM  Result Value Ref Range   Sed Rate 42 (H) 0 - 16 mm/hr  Heparin level (unfractionated)     Status: None   Collection Time:  10/20/18  5:45 PM  Result Value Ref Range   Heparin Unfractionated 0.38 0.30 - 0.70 IU/mL  CBC     Status: Abnormal   Collection Time: 10/21/18  4:17 AM  Result Value Ref Range   WBC 17.0 (H) 4.0 - 10.5 K/uL   RBC 4.26 4.22 - 5.81 MIL/uL   Hemoglobin 12.9 (L) 13.0 - 17.0 g/dL   HCT 39.2 39.0 - 52.0 %   MCV 92.0 80.0 - 100.0 fL   MCH 30.3 26.0 - 34.0 pg   MCHC 32.9 30.0 - 36.0 g/dL   RDW 13.3 11.5 - 15.5 %   Platelets 327 150 - 400 K/uL   nRBC 0.0 0.0 - 0.2 %  Heparin level (unfractionated)      Status: None   Collection Time: 10/21/18  4:17 AM  Result Value Ref Range   Heparin Unfractionated 0.48 0.30 - 0.70 IU/mL  Basic metabolic panel     Status: Abnormal   Collection Time: 10/21/18  4:17 AM  Result Value Ref Range   Sodium 133 (L) 135 - 145 mmol/L   Potassium 3.5 3.5 - 5.1 mmol/L   Chloride 94 (L) 98 - 111 mmol/L   CO2 25 22 - 32 mmol/L   Glucose, Bld 165 (H) 70 - 99 mg/dL   BUN 30 (H) 8 - 23 mg/dL   Creatinine, Ser 1.37 (H) 0.61 - 1.24 mg/dL   Calcium 8.3 (L) 8.9 - 10.3 mg/dL   GFR calc non Af Amer 50 (L) >60 mL/min   GFR calc Af Amer 58 (L) >60 mL/min   Anion gap 14 5 - 15  Magnesium     Status: None   Collection Time: 10/21/18  4:17 AM  Result Value Ref Range   Magnesium 2.0 1.7 - 2.4 mg/dL  .Cooxemetry Panel (carboxy, met, total hgb, O2 sat)     Status: None   Collection Time: 10/21/18  4:29 AM  Result Value Ref Range   Total hemoglobin 13.5 12.0 - 16.0 g/dL   O2 Saturation 65.9 %   Carboxyhemoglobin 1.2 0.5 - 1.5 %   Methemoglobin 0.9 0.0 - 1.5 %   Dg Chest Port 1 View  Result Date: 10/20/2018 CLINICAL DATA:  Shortness of breath.  Respiratory failure. EXAM: PORTABLE CHEST 1 VIEW COMPARISON:  October 19, 2018 FINDINGS: Right greater than left asymmetric pulmonary infiltrates are stable. A right PICC line terminates in the central SVC, new in the interval. No pneumothorax. No change in the cardiomediastinal silhouette. IMPRESSION: 1. The new right PICC line terminates in the central SVC. 2. Right greater than left pulmonary infiltrates are stable. 3. No other changes. Electronically Signed   By: Dorise Bullion III M.D   On: 10/20/2018 10:01   Dg Chest Port 1 View  Result Date: 10/19/2018 CLINICAL DATA:  Shortness of breath. EXAM: PORTABLE CHEST 1 VIEW COMPARISON:  10/18/2018 FINDINGS: The cardiac silhouette remains mildly enlarged. They are worsening airspace opacities throughout the right lung. Left basilar airspace opacities are unchanged. Small pleural effusions  are not excluded. No pneumothorax is identified. IMPRESSION: Worsening asymmetric airspace disease throughout the right lung. Electronically Signed   By: Logan Bores M.D.   On: 10/19/2018 10:19    Assessment/Plan: Diagnosis: Debility Labs and images (see above) independently reviewed.  Records reviewed and summated above.  1. Does the need for close, 24 hr/day medical supervision in concert with the patient's rehab needs make it unreasonable for this patient to be served in a less intensive setting? Yes  2. Co-Morbidities requiring supervision/potential complications: Supplemental oxygen dependent (wean as tolerated), HTN (monitor and provide prns in accordance with increased physical exertion and pain), steroid-induced hyperglycemia on type 2 DM (Monitor in accordance with exercise and adjust meds as necessary), tobacco abuse (counsel), CAD (recs per cards, transition from heparin GGT when appropriate), leukocytosis (repeat labs, cont to monitor for signs and symptoms of infection, further workup if indicated), hyponatremia (repeat labs, consider treatment if necessary), CHF (Monitor in accordance with increased physical activity and avoid UE resistance excercises) 3. Due to safety, disease management and patient education, does the patient require 24 hr/day rehab nursing? Yes 4. Does the patient require coordinated care of a physician, rehab nurse, PT (1-2 hrs/day, 5 days/week) and OT (1-2 hrs/day, 5 days/week) to address physical and functional deficits in the context of the above medical diagnosis(es)? Yes Addressing deficits in the following areas: balance, endurance, locomotion, strength, transferring, bathing, dressing, toileting and psychosocial support 5. Can the patient actively participate in an intensive therapy program of at least 3 hrs of therapy per day at least 5 days per week? Potentially 6. The potential for patient to make measurable gains while on inpatient rehab is  excellent 7. Anticipated functional outcomes upon discharge from inpatient rehab are modified independent and supervision  with PT, modified independent and supervision with OT, n/a with SLP. 8. Estimated rehab length of stay to reach the above functional goals is: 17-20 days. 9. Anticipated D/C setting: Home 10. Anticipated post D/C treatments: HH therapy and Home excercise program 11. Overall Rehab/Functional Prognosis: excellent  RECOMMENDATIONS: This patient's condition is appropriate for continued rehabilitative care in the following setting: CIR when able to tolerate 3 hours of therapy per day. Patient has agreed to participate in recommended program. Yes Note that insurance prior authorization may be required for reimbursement for recommended care.  Comment: Rehab Admissions Coordinator to follow up.   I have personally performed a face to face diagnostic evaluation, including, but not limited to relevant history and physical exam findings, of this patient and developed relevant assessment and plan.  Additionally, I have reviewed and concur with the physician assistant's documentation above.   Delice Lesch, MD, ABPMR Lavon Paganini Angiulli, PA-C 10/21/2018

## 2018-10-21 NOTE — Evaluation (Signed)
Occupational Therapy Evaluation Patient Details Name: Justin Bradford. MRN: [id number] DOB: 01-23-1941 Today's Date: 10/21/2018    History of Present Illness 78yo male who presented with PMH of COPD, HTN, HLD, BPH, T2DM who presented with chest pain and shortness of breath with positive NSTEMI and was treated for LAD occlusion with DES stent, HF exacerbation and CAP without improvement in dyspnea or hypoxia.   Clinical Impression   PTA patient independent.  Admitted for above and limited by problem list below, including impaired balance, generalized weakness and decreased activity tolerance.  Patient able to complete transfers with mod assist +2 for safety using eva walker, min assist for UB ADLs, setup assist for grooming tasks seated, mod assist for LB ADLs. Required 6L supplemental via [place] to maintain oxygen saturations during functional mobility, but 1.5 L at rest seated; cueing for pursed lip breathing throughout session. Patient will benefit from continued OT services while admitted and believe he will best benefit from continued CIR level rehab in order to return to [facility] and optimize independence with ADLs/mobility.      Follow Up Recommendations  CIR    Equipment Recommendations  Other (comment)(TBD at next venue of care)    Recommendations for Other Services       Precautions / Restrictions Precautions Precautions: Fall Restrictions Weight Bearing Restrictions: No      Mobility Bed Mobility               General bed mobility comments: OOB upon entry   Transfers Overall transfer level: Needs assistance Equipment used: (EVA walker ) Transfers: Sit to/from Stand Sit to Stand: Mod assist;+2 safety/equipment         General transfer comment: cueing for hand placement, safety; assist for power up and inital standing balance     Balance Overall balance assessment: Needs assistance Sitting-balance support: No upper extremity supported;Feet supported Sitting  balance-[name]: Fair     Standing balance support: Bilateral upper extremity supported;During functional activity Standing balance-[name]: Poor Standing balance comment: relies on UE support for balance                           ADL either performed or assessed with clinical judgement   ADL Overall ADL's : Needs assistance/impaired     Grooming: Set up;Sitting   Upper Body Bathing: Minimal assistance;Sitting   Lower Body Bathing: Moderate assistance;Sit to/from stand   Upper Body Dressing : Minimal assistance;Sitting   Lower Body Dressing: Moderate assistance;Sit to/from stand Lower Body Dressing Details (indicate cue type and reason): able to figure 4 with R LE but not L LE  Toilet Transfer: Moderate assistance;Ambulation;+2 for safety/equipment Toilet Transfer Details (indicate cue type and reason): simulated to/from recliner  Toileting- Clothing Manipulation and Hygiene: Moderate assistance;Sit to/from stand       Functional mobility during ADLs: Moderate assistance;+2 for safety/equipment General ADL Comments: pt limited by generalized weakness, impaired balance, and generalized weakness      Vision   Vision Assessment?: No apparent visual deficits     Perception     Praxis      Pertinent Vitals/Pain Pain Assessment: No/denies pain     Hand Dominance     Extremity/Trunk Assessment Upper Extremity Assessment Upper Extremity Assessment: Generalized weakness   Lower Extremity Assessment Lower Extremity Assessment: Defer to PT evaluation   Cervical / Trunk Assessment Cervical / Trunk Assessment: Normal   Communication Communication Communication: No difficulties   Cognition Arousal/Alertness: Awake/alert  Behavior During Therapy: WFL for tasks assessed/performed Overall Cognitive Status: Within Functional Limits for tasks assessed                                     General Comments  maintained oxgyen saturations on  6L during mobility, on 1.5 L while seated resting; continued education on PLB    Exercises     Shoulder Instructions      Home Living Family/patient expects to be discharged to:: Private residence Living Arrangements: Alone Available Help at Discharge: Family Type of Home: House Home Access: Ramped entrance     Home Layout: One level     Bathroom Shower/Tub: Chief Strategy Officer: Standard     Home Equipment: Toilet riser;Grab bars - tub/shower;Hand held shower head          Prior Functioning/Environment Level of Independence: Independent        Comments: 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          OT Problem List: Decreased strength;Decreased activity tolerance;Impaired balance (sitting and/or standing);Decreased knowledge of use of DME or AE;Decreased knowledge of precautions;Cardiopulmonary status limiting activity      OT Treatment/Interventions: Self-care/ADL training;Energy conservation;DME and/or AE instruction;Therapeutic activities;Patient/family education;Balance training    OT Goals(Current goals can be found in the care plan section) Acute Rehab OT Goals Patient Stated Goal: to go home OT Goal Formulation: With patient Time For Goal Achievement: 11/04/18 Potential to Achieve Goals: Good  OT Frequency: Min 2X/week   Barriers to D/C:            Co-evaluation PT/OT/SLP Co-Evaluation/Treatment: Yes Reason for Co-Treatment: For patient/therapist safety PT goals addressed during session: Mobility/safety with mobility OT goals addressed during session: ADL's and self-care      AM-PAC OT "6 Clicks" Daily Activity     Outcome Measure Help from another person eating meals?: None Help from another person taking care of personal grooming?: A Little Help from another person toileting, which includes using toliet, bedpan, or urinal?: A Lot Help from another person bathing (including washing, rinsing, drying)?: A  Lot Help from another person to put on and taking off regular upper body clothing?: A Little Help from another person to put on and taking off regular lower body clothing?: A Lot 6 Click Score: 16   End of Session Equipment Utilized During Treatment: Gait belt;Other (comment);Oxygen(EVA walker ) Nurse Communication: Mobility status  Activity Tolerance: Patient tolerated treatment well Patient left: in chair;with call bell/phone within reach  OT Visit Diagnosis: Other abnormalities of gait and mobility (R26.89);Muscle weakness (generalized) (M62.81)                Time: 6720-9470 OT Time Calculation (min): 31 min Charges:  OT General Charges $OT Visit: 1 Visit OT Evaluation $OT Eval Moderate Complexity: 1 Mod  Chancy Milroy, OT Acute Rehabilitation Services Pager [phone] Office [phone]   Chancy Milroy 10/21/2018, 1:21 PM

## 2018-10-21 NOTE — Progress Notes (Signed)
ANTICOAGULATION CONSULT NOTE - Follow-Up Consult  Pharmacy Consult for Heparin Indication: atrial fibrillation  No Known Allergies  Patient Measurements: Height: 6' (182.9 cm) Weight: 283 lb 11.7 oz (128.7 kg) IBW/kg (Calculated) : 77.6 HEPARIN DW (KG): 107.9   Vital Signs: Temp: 98 F (36.7 C) (04/21 0809) Temp Source: Oral (04/21 0809) BP: 105/77 (04/21 0800) Pulse Rate: 98 (04/21 0800)  Labs: Recent Labs    10/19/18 0321  10/20/18 0325 10/20/18 0804 10/20/18 1745 10/21/18 0417  HGB 12.1*  --  12.1*  --   --  12.9*  HCT 38.6*  --  37.0*  --   --  39.2  PLT 267  --  356  --   --  327  HEPARINUNFRC  --    < >  --  0.25* 0.38 0.48  CREATININE 1.44*  --  1.13  --   --  1.37*   < > = values in this interval not displayed.    Estimated Creatinine Clearance: 63.6 mL/min (A) (by C-G formula based on SCr of 1.37 mg/dL (H)).   Medical History: Past Medical History:  Diagnosis Date  . Hypertension   . Peripheral neuropathy     Medications:  Scheduled:  . alum & mag hydroxide-simeth  30 mL Oral Once   And  . lidocaine  15 mL Oral Once  . arformoterol  15 mcg Nebulization BID  . aspirin  81 mg Oral Daily  . atorvastatin  80 mg Oral q1800  . azithromycin  250 mg Oral Daily  . budesonide (PULMICORT) nebulizer solution  0.25 mg Nebulization BID  . Chlorhexidine Gluconate Cloth  6 each Topical Daily  . clonazePAM  1 mg Oral QHS  . FLUoxetine  60 mg Oral Daily  . furosemide  80 mg Intravenous BID  . lamoTRIgine  75 mg Oral QHS  . pantoprazole  40 mg Oral Daily  . potassium chloride  40 mEq Oral BID  . sodium chloride flush  10-40 mL Intracatheter Q12H  . spironolactone  12.5 mg Oral Daily  . tamsulosin  0.4 mg Oral QHS  . ticagrelor  90 mg Oral BID   Infusions:  . sodium chloride Stopped (10/21/18 0528)  . sodium chloride Stopped (10/19/18 1353)  . amiodarone 30 mg/hr (10/21/18 0800)  . cefTRIAXone (ROCEPHIN)  IV Stopped (10/20/18 0940)  . heparin 2,050  Units/hr (10/21/18 0800)  . nitroGLYCERIN Stopped (10/16/18 1151)  . norepinephrine (LEVOPHED) Adult infusion Stopped (10/20/18 1116)    Assessment: Pt is a 77yoM with new onset afib with RVR. Pharmacy is consulted to start heparin drip. CBC relatively stable. Pt converted to NSR on amio gtt. Patient also on asa/brilinta planning to continue heparin for now, with transitioning to eliquis/plavix if further afib noted   Heparin level remains therapeutic at 0.48 units/ml on heparin IV infusion at 2050 units/hr.    Goal of Therapy:  Heparin level 0.3-0.7 units/ml Monitor platelets by anticoagulation protocol: Yes   Plan:  -Continue heparin infusion at 2050 units/hr -Daily heparin level/ CBC  Ewing Schlein, PharmD PGY1 Pharmacy Resident 10/21/2018    8:31 AM Please check AMION for all [facility] Pharmacy numbers

## 2018-10-21 NOTE — Progress Notes (Addendum)
NAME:  Justin Bradford., MRN:  [id number], DOB:  January 18, 1941, LOS: 6 ADMISSION DATE:  10/15/2018, CONSULTATION DATE:  10/19/18 REFERRING MD:  Dr. Gala Romney CHIEF COMPLAINT:  Shortness of breath   Brief History   78yo male who presented with PMH of COPD, HTN, HLD, BPH, T2DM who presented with chest pain and shortness of breath and was treated for LAD occlusion with DES stent, HF exacerbation and CAP without improvement in dyspnea or hypoxia.  History of present illness   78yo male who presented with chest pain associated with dyspnea, malaise and nausea on 10/15/18 found to have leukocytosis and pulmonary infiltrates concerning for pneumonia. Troponins peaked >65; he is s/p cath which found 100% LAD occlusion s/p DES stenting. EF is 30-35%; RV with normal systolic function though unable to estimate pressures; valves not well visualized; IVC with >50% repirophasic variability. CTA negative for acute PE; ground-glass changes noted worse on RUL posteriorly. He has been diuresed, covered for CAP with azithromycin and ceftriaxone, and maintained on his home COPD medications. Despite this treatment he is still dyspneic, with ongoing pulmonary infiltrate and requiring 5-6L HFNC.  Past Medical History  HTN, T2DM, HLD, BPH, labeled COPD (PFTs not diagnostic for COPD), tobacco use  Significant Hospital Events   4/15 Admitted 4/16 LHC reveals 100% LAD stenosis  Consults:  HF PCCM  Procedures:  4/16 LHC 100% LAD occlusion s/p DES; 60% circ stenosis  Significant Diagnostic Tests:  4/15 CXR bilateral basilar atelectasis vs infiltrate 4/15 CTA - no acute PE, right sided ground-glass opacities 4/16 Echo EF 30-35% 4/16 CXR increased R>L pulmonary opacities 4/19 CXR ongoing bilateral pulmonary infiltrates   Micro Data:  COVID-19 negative x2 Resp panel 4/16 negative Step pneumo 4/15 negative Legionella 4/15 negative BCx 4/15 NGTD  Antimicrobials:  Azithromycin 4/15>4/19 Ceftriaxone 4/15>4/20   Interim history/subjective:  Able to be weaned off of oxygen this morning; had significant diuresis yesterday with increased lasix dosing and was able to ambulate a couple of times.  Objective   Blood pressure 110/68, pulse 65, temperature (!) 97.5 F (36.4 C), temperature source Oral, resp. rate 20, height 6' (1.829 m), weight 128.7 kg, SpO2 95 %. CVP:  [10 mmHg-13 mmHg] 13 mmHg      Intake/Output Summary (Last 24 hours) at 10/21/2018 0755 Last data filed at 10/21/2018 0500 Gross per 24 hour  Intake 1817.91 ml  Output 3525 ml  Net -1707.09 ml   Filed Weights   10/15/18 1709 10/16/18 0036 10/21/18 0500  Weight: 133.4 kg 132.1 kg 128.7 kg    Examination: General: elderly gentleman resting in bed HENT: anicteric sclera, unable to appreciate JVD Lungs: speaking in full sentences this morning; lungs without rales bilaterally, just slight end-expiratory wheezing on right upper lung regions Cardiovascular: RRR, no m/r/g appreciated, warm extremities with pulses intact Abdomen: soft, obese, nontender Extremities: warm, no clubbing of digits Neuro: no tremor this morning Skin: warm, dry w/o rashes  Resolved Hospital Problem list     Assessment & Plan:  Hypoxic respiratory failure with R>L pulmonary infiltrates: Likely multifactorial in setting of long standing tobacco use, HFrEF, and possible aspirations based on bibasilar infiltrates. Was able to be weaned off of oxygen at rest this morning; I suspect he will continue needing supplemental oxygen with ambulation at least for the time being. CVP 13 this AM and he is net negative 1.7L in last 24hrs. -continue diuresis -CAP coverage for 5 day total duration -steroid stopped after 1 dose, likely ok to hold as biggest  improvement seems with increased activity and aggressive diuresis. -continue COPD nebs -O2 with goal 88-92% -IS and flutter valve -ambulate and sit up in chair as able  CAD NSTEMI s/p LAD DES: Peak troponin >65; cath  on 4/16 revealed 100% LAD stenosis as well as 60% LCx and 20% RCA; he is s/p LAD DES with residual 55% stenosis.  -continue medical therapy with DAPT, statin, BB, Entresto  Acute systolic CHF in setting of NSTEMI: EF 30-35% on recent echo; inadequate evaluation of valves.   -continue diuresis -continue entresto, spironolactone, BB  Labeled COPD, former tobacco user: FEV1/FVC 73, on recent PFTs in March. -changed inhalers to nebs  PAF: New as of 4/19, in setting of acute illness. On amiodarone and heparin infusions, per HF. Replete mag and K as needed per primary.  AKI: Stable; recently started on entresto. Continue monitoring.  Best practice:  Diet: HH Pain/Anxiety/Delirium protocol (if indicated): n/a VAP protocol (if indicated): n/a DVT prophylaxis: heparin GI prophylaxis: n/a Glucose control: n/a Mobility: up with assistance Code Status: FULL Family Communication: able to discuss with patient  Disposition: Likely able to transfer out of ICU today  Labs   CBC: Recent Labs  Lab 10/15/18 2051  10/17/18 0255 10/18/18 0259 10/19/18 0321 10/20/18 0325 10/21/18 0417  WBC 17.2*   < > 22.3* 17.4* 15.6* 22.4* 17.0*  NEUTROABS 15.0*  --   --   --   --   --   --   HGB 15.1   < > 14.6 13.4 12.1* 12.1* 12.9*  HCT 46.3   < > 44.1 40.8 38.6* 37.0* 39.2  MCV 93.3   < > 93.8 93.2 93.9 92.7 92.0  PLT 291   < > 289 258 267 356 327   < > = values in this interval not displayed.    Basic Metabolic Panel: Recent Labs  Lab 10/17/18 0255 10/18/18 0259 10/19/18 0321 10/20/18 0325 10/21/18 0417  NA 136 132* 133* 135 133*  K 3.6 3.5 3.7 3.1* 3.5  CL 96* 96* 98 104 94*  CO2 31 25 25  21* 25  GLUCOSE 139* 134* 132* 237* 165*  BUN 23 30* 29* 25* 30*  CREATININE 1.44* 1.44* 1.44* 1.13 1.37*  CALCIUM 8.6* 8.3* 8.3* 7.0* 8.3*  MG  --   --  2.3  --  2.0   GFR: Estimated Creatinine Clearance: 63.6 mL/min (A) (by C-G formula based on SCr of 1.37 mg/dL (H)). Recent Labs  Lab  10/15/18 2050  10/17/18 0255 10/18/18 0259 10/19/18 0321 10/20/18 0325 10/21/18 0417  PROCALCITON  --   --  0.38  --   --   --   --   WBC  --    < > 22.3* 17.4* 15.6* 22.4* 17.0*  LATICACIDVEN 1.7  --   --   --   --   --   --    < > = values in this interval not displayed.    Liver Function Tests: Recent Labs  Lab 10/16/18 0250  AST 163*  ALT 37  ALKPHOS 75  BILITOT 1.2  PROT 7.0  ALBUMIN 3.9   No results for input(s): LIPASE, AMYLASE in the last 168 hours. No results for input(s): AMMONIA in the last 168 hours.  ABG    Component Value Date/Time   HCO3 23.7 10/16/2018 0255   ACIDBASEDEF 1.1 10/16/2018 0255   O2SAT 65.9 10/21/2018 0429     Coagulation Profile: No results for input(s): INR, PROTIME in the last 168 hours.  Cardiac Enzymes: Recent Labs  Lab 10/15/18 1722 10/15/18 2022 10/16/18 0250 10/16/18 0843 10/16/18 1608  TROPONINI 0.05* 1.40* 12.54* 15.50* >65.00*    HbA1C: No results found for: HGBA1C  CBG: No results for input(s): GLUCAP in the last 168 hours.  Review of Systems:   Feels improved as he was able to ambulate a couple of times yesterday and had a good night's sleep. Feels breathing is improve. He only had the one episode of hemoptysis yesterday.  Past Medical History  He,  has a past medical history of Hypertension and Peripheral neuropathy.   Surgical History    Past Surgical History:  Procedure Laterality Date  . CORONARY STENT INTERVENTION N/A 10/16/2018   Procedure: CORONARY STENT INTERVENTION;  Surgeon: Lennette Bihari, MD;  Location: [facility] INVASIVE CV LAB;  Service: Cardiovascular;  Laterality: N/A;  . KNEE SURGERY    . LEFT HEART CATH AND CORONARY ANGIOGRAPHY N/A 10/16/2018   Procedure: LEFT HEART CATH AND CORONARY ANGIOGRAPHY;  Surgeon: Lennette Bihari, MD;  Location: [facility] INVASIVE CV LAB;  Service: Cardiovascular;  Laterality: N/A;     Social History   reports that he has quit smoking. He has never used smokeless tobacco. He  reports current alcohol use. He reports that he does not use drugs.   Family History   His family history is not on file.   Allergies No Known Allergies   Home Medications  Prior to Admission medications   Medication Sig Start Date End Date Taking? Authorizing Provider  aspirin EC 81 MG tablet Take 81 mg by mouth daily.   Yes [provider]  BREO ELLIPTA 100-25 MCG/INH AEPB Inhale 1 puff into the lungs daily. 09/25/18  Yes [provider]  FLUoxetine (PROZAC) 20 MG capsule Take 60 mg by mouth every morning. 01/28/17  Yes [provider]  hydrochlorothiazide (HYDRODIURIL) 50 MG tablet Take 50 mg by mouth every morning.    Yes [provider]  lamoTRIgine (LAMICTAL) 25 MG tablet Take 75 mg by mouth at bedtime. 01/28/17  Yes [provider]  losartan (COZAAR) 25 MG tablet Take 50 mg by mouth every morning. 08/16/18  Yes [provider]  metFORMIN (GLUCOPHAGE) 500 MG tablet Take 250 mg by mouth every morning.    Yes [provider]  omeprazole (PRILOSEC) 20 MG capsule Take 20 mg by mouth every morning. 05/10/18  Yes [provider]  simvastatin (ZOCOR) 40 MG tablet Take 40 mg by mouth every morning.    Yes [provider]  spironolactone (ALDACTONE) 25 MG tablet Take 25 mg by mouth 2 (two) times daily.    Yes [provider]  tamsulosin (FLOMAX) 0.4 MG CAPS capsule Take 0.4 mg by mouth at bedtime.    Yes [provider]  VENTOLIN HFA 108 (90 Base) MCG/ACT inhaler Inhale 1 puff into the lungs every 4 (four) hours as needed for wheezing. 09/11/18  Yes [provider]  metoprolol succinate (TOPROL-XL) 50 MG 24 hr tablet Take 50 mg by mouth daily. 05/10/18   [provider]     Nyra Market, MD PGY3 Pager [phone]    ____________________________________________________________________________  PCCM Attending:   78 yo M, admitted for respiratory failure, new ACS  s/p DES to LAD. Pulmonary asked to see for BL infiltrates.   BP 105/77 (BP Location: Left Arm)   Pulse 98   Temp 98 F (36.7 C) (Oral)   Resp (!) 24   Ht 6' (1.829 m)   [employer]  128.7 kg   SpO2 92%   BMI 38.48 kg/m   Gen: elderly, sitting up in bed, no distress Lungs: still with a few persistent crackles  Heart: RRR, S1 S2  Lungs: Crackles persistent in the right base  A: AHRF, BL infiltrates ACS s/p DES  Acute on Chronic Systolic Heart Failure  Cardiogenic shock resolved Afib with RVR on Amio Chronic Alcohol Use  BL UE tremor   P:  Diuresis per cardiology  Up in chair  PT/OT  Rest per primary Would recommend follow up CXR to ensure resolution    Pulmonary will sign off.   Josephine IgoBradley L Icard, DO [facility] 10/21/2018 9:04 AM

## 2018-10-21 NOTE — Progress Notes (Signed)
Inpatient Rehabilitation Admissions Coordinator  Inpatient Rehab Consult received. I met with patient at the bedside for rehabilitation assessment. We discussed goals and expectations of an inpatient rehab admission.  He prefers an inpt rehab admit rather than SNF. His wife received inpt rehab at [facility] so he is familiar with the venue. I will await medical readiness and his ability to tolerate more therapy before planning admit.  Danne Baxter, RN, MSN Rehab Admissions Coordinator [phone] 10/21/2018 3:44 PM

## 2018-10-21 NOTE — TOC Initial Note (Signed)
Transition of Care (TOC) - Initial/Assessment Note    Patient Details  Name: Justin Bradford. MRN: [id number] Date of Birth: 03/22/1941  Transition of Care [facility]) CM/SW Contact:    Colleen Can RN, BSN, NCM-BC, ACM-RN [phone] (working remotely) Phone Number: 10/21/2018, 10:17 AM  Clinical Narrative:                 78 yo male presented with CP and SOB; s/p NSTEMI with stent placement. PMH: COPD, HTN, HLD, BPH and Type II Diabetes. PT eval complete with CIR recommended. Patient is now being evaluated for Inpt Rehab post transition, with the [facility] and IP Rehab MD following; CM team will continue to follow for progression of care.  Expected Discharge Plan: IP Rehab Facility Barriers to Discharge: Continued Medical Work up   Patient Goals and CMS Choice Patient states their goals for this hospitalization and ongoing recovery are:: "to go home" CMS Medicare.gov Compare Post Acute Care list provided to:: Patient Choice offered to / list presented to : Patient  Expected Discharge Plan and Services Expected Discharge Plan: IP Rehab Facility In-house Referral: NA Discharge Planning Services: CM Consult Post Acute Care Choice: IP Rehab Living arrangements for the past 2 months: Single Family Home                 DME Arranged: N/A DME Agency: NA HH Arranged: NA HH Agency: NA  Prior Living Arrangements/Services Living arrangements for the past 2 months: Single Family Home Lives with:: Self   Current home services: (N/A)   Activities of Daily Living Home Assistive Devices/Equipment: None ADL Screening (condition at time of admission) Patient's cognitive ability adequate to safely complete daily activities?: Yes Is the patient deaf or have difficulty hearing?: No Does the patient have difficulty seeing, even when wearing glasses/contacts?: No Does the patient have difficulty concentrating, remembering, or making decisions?: No Patient able to express need for assistance  with ADLs?: Yes Does the patient have difficulty dressing or bathing?: No Independently performs ADLs?: Yes (appropriate for developmental age) Does the patient have difficulty walking or climbing stairs?: No Weakness of Legs: Both Weakness of Arms/Hands: None    Orientation: : Oriented to Place, Oriented to  Time, Oriented to Situation, Oriented to Self Alcohol / Substance Use: Not Applicable Psych Involvement: No (comment)  Admission diagnosis:  Atypical pneumonia [J18.9] Elevated troponin [R79.89] COPD exacerbation (HCC) [J44.1] Patient Active Problem List   Diagnosis Date Noted  . Myocardial infarction involving left anterior descending (LAD) coronary artery (HCC) 10/21/2018  . Status post coronary artery stent placement   . Acute systolic heart failure (HCC)   . Acute anterior myocardial infarction (HCC)   . Atypical pneumonia   . Elevated troponin   . Chest pain 10/15/2018  . COPD exacerbation (HCC) 10/15/2018  . Left wrist injury 03/22/2016  . Bilateral primary osteoarthritis of knee 03/15/2015   PCP:  Forrest Moron, MD Pharmacy:   [facility] [address] [place] - [address] [place] [address] Phone: [phone] Fax: [phone]

## 2018-10-21 NOTE — Progress Notes (Signed)
PT note Pt seen by PT.  Full note to follow. Ambulated with eva walker up to 180 feet with min assist and +2 for lines. Pt needed 6LO2 to keep sats >90%.   Will continue to progress pt.  Cambree Hendrix,PT Acute Rehabilitation Services Pager:  [phone]  Office:  [phone]

## 2018-10-21 NOTE — Progress Notes (Signed)
Physical Therapy Treatment Patient Details Name: Justin Bradford. MRN: [id number] DOB: [birth year] Today's Date: 10/21/2018    History of Present Illness 78yo male who presented with PMH of COPD, HTN, HLD, BPH, T2DM who presented with chest pain and shortness of breath with positive NSTEMI and was treated for LAD occlusion with DES stent, HF exacerbation and CAP without improvement in dyspnea or hypoxia.    PT Comments    Pt admitted with above diagnosis. Pt currently with functional limitations due to balance and endurance deficits. Pt was able to incr distance walking with Carley Hammed walker with min assist of 2 persons.  Pt was able to decr O2 needs as well on 1.5L at rest and 6-8 L with activity to keep sats >90%.   Pt will benefit from skilled PT to increase their independence and safety with mobility to allow discharge to the venue listed below.    Follow Up Recommendations  CIR;Supervision/Assistance - 24 hour     Equipment Recommendations  Rolling walker with 5" wheels;3in1 (PT)    Recommendations for Other Services Rehab consult     Precautions / Restrictions Precautions Precautions: Fall Restrictions Weight Bearing Restrictions: No RUE Weight Bearing: Non weight bearing    Mobility  Bed Mobility               General bed mobility comments: OOB upon entry   Transfers Overall transfer level: Needs assistance Equipment used: (EVA walker ) Transfers: Sit to/from Stand Sit to Stand: Mod assist;+2 safety/equipment         General transfer comment: cueing for hand placement, safety; assist for power up and inital standing balance   Ambulation/Gait Ambulation/Gait assistance: Min assist;+2 safety/equipment Gait Distance (Feet): 180 Feet Assistive device: (EVA walker) Gait Pattern/deviations: Step-through pattern;Decreased stride length;Trunk flexed;Wide base of support;Staggering right;Staggering left;Leaning posteriorly   Gait velocity interpretation: <1.31  ft/sec, indicative of household ambulator General Gait Details: Pt more steady today needing  less assist throughout. Pt able to steer Carley Hammed walker. Still needed cues to stand tall as well with cues for posture as he flexes forward quite a bit.  Pt fatigues and needs resting stand breaks as well as constant cues for pursed lip breathing.  Pt O2 on 4- 6L was mid 80's with activity.  With activity, needed 6-8 LHFNC to keep sats >90%.  DOE 2/4 by end of walk.  Pt much improved today with decr O2 needs and progression of distance.    Stairs             Wheelchair Mobility    Modified Rankin (Stroke Patients Only)       Balance Overall balance assessment: Needs assistance Sitting-balance support: No upper extremity supported;Feet supported Sitting balance-[name]: Fair     Standing balance support: Bilateral upper extremity supported;During functional activity Standing balance-[name]: Poor Standing balance comment: relies on UE support for balance                            Cognition Arousal/Alertness: Awake/alert Behavior During Therapy: WFL for tasks assessed/performed Overall Cognitive Status: Within Functional Limits for tasks assessed                                        Exercises      General Comments General comments (skin integrity, edema, etc.): 1.5LO2 at rest  Pertinent Vitals/Pain Pain Assessment: No/denies pain    Home Living                      Prior Function            PT Goals (current goals can now be found in the care plan section) Acute Rehab PT Goals Patient Stated Goal: to go home Progress towards PT goals: Progressing toward goals    Frequency    Min 3X/week      PT Plan Current plan remains appropriate    Co-evaluation PT/OT/SLP Co-Evaluation/Treatment: Yes Reason for Co-Treatment: Complexity of the patient's impairments (multi-system involvement);For patient/therapist safety PT  goals addressed during session: Mobility/safety with mobility        AM-PAC PT "6 Clicks" Mobility   Outcome Measure  Help needed turning from your back to your side while in a flat bed without using bedrails?: A Lot Help needed moving from lying on your back to sitting on the side of a flat bed without using bedrails?: A Lot Help needed moving to and from a bed to a chair (including a wheelchair)?: A Lot Help needed standing up from a chair using your arms (e.g., wheelchair or bedside chair)?: A Lot Help needed to walk in hospital room?: A Lot Help needed climbing 3-5 steps with a railing? : A Lot 6 Click Score: 12    End of Session Equipment Utilized During Treatment: Gait belt;Oxygen Activity Tolerance: Patient limited by fatigue Patient left: with call bell/phone within reach;in chair;with chair alarm set Nurse Communication: Mobility status PT Visit Diagnosis: Unsteadiness on feet (R26.81);Muscle weakness (generalized) (M62.81)     Time:  - 1148 am    Charges:     Katherine Roan Kayelyn Lemon,PT Acute Rehabilitation Services Pager:  [phone]  Office:  [phone]     Berline Lopes 10/21/2018, 8:19 PM

## 2018-10-21 NOTE — Progress Notes (Addendum)
Subjective: HD#6   Overnight: One episode of bradycardia in the 50's. Remained asymptomatic   Today he feels "great" and had a wonderful night. He was able to sleep the entire night. He reports that his oxygen requirement has decreased. He walked twice yesterday. He was able to get his electronic tablet yesterday and see all the well wishes his friends have sent him. Celebrated his progress and discussed plan for continued diuresis today and evaluation for inpatient rehab.  Since starting Klonopin yesterday, he denies anxiety.  Objective:  Vital signs in last 24 hours: Vitals:   10/21/18 0300 10/21/18 0400 10/21/18 0413 10/21/18 0500  BP: 105/73 103/71  105/63  Pulse: 70 67  68  Resp: (!) 22 19  19   Temp:   (!) 97.5 F (36.4 C)   TempSrc:   Oral   SpO2: 95% 93%  96%  Weight:    128.7 kg  Height:       Const: Very pleasant gentleman, lying comfortably in bed, in no apparent distress Resp: Bibasilar Rales, no wheezes or rhonchi CV: Sinus rhythm, no murmurs, gallops, rubs Extremity: No lower extremity edema.  Assessment/Plan:  Principal Problem:   Acute anterior myocardial infarction [facility]) Active Problems:   Chest pain   COPD exacerbation (HCC)   Atypical pneumonia   Elevated troponin   Acute systolic heart failure (HCC)   Status post coronary artery stent placement  Justin Bradford is a 78 yo M w/ PMH of HTN, HLD, T2DM, BPH, obesity whopresentedwith acute onsetchest pain found to have Acute anterior MI with 100% occlusion of proximal LED s/p DES. Also here for management of Acute HFrEF, Hypoxic respiratory failure, CAP and A-Fibb.  #Acute anterior MI: Objectively, his vital signs are very reassuring and has been taking of pressors.  Blood pressure stable this morning at 105/63, pulse of 68 and oxygen saturation of 96% on 1.5L nasal cannula.  Clinically he has improved since our observation yesterday. -Continue aspirin 81 mg daily, Brilinta -Continue Lipitor 80 mg daily  -Continue Coreg  -Appreciate cardiology recommendation  #Acute HFrEF: Dyspnea with communication has improved since starting diuresis yesterday.  Today he is able to complete a sentence without being dyspneic.Marland Kitchen UOP of 3.5L (Net + 1.2L). Weight has also trended down 128kg <133kg.  -Discontinue IV Lasix 80 mg  today and transition to p.o. Lasix 40 mg daily -Strict intake and output -Continue Coreg, spironolactone and losartan  -Continue cardiac monitoring  #New onset A. fib with RVR:  Resolved, continue to remain in sinus rhythm. -Continue amiodarone infusion and heparin (with plan to discontinue since there was an inciting event which precipitated atrial fibrillation and he has remained in sinus rhythm).  Per cardiology, will continue heparin gtt for now and if he converts back to atrial fibrillation will transition to Eliquis and Plavix.  If not, then we will continue dual antiplatelet therapy plus heparin gtt  for now.  #Acute hypoxic respiratory failure:  - Continue Rocephin (day 4) - Continue nebulizer treatment  #Acute kidney injury: Stable -Slightly elevated today due to IV diuresis. -Serum creatinine this morning of 1.3<1.1<1.4 -Continue to monitor  #Parkinsonian features: -Continue lamotrigine and fluoxetine  #COPD -Continue nebulizers with pulmicort, brovana   #Hypertension -BP stable this am 106/72  -Continue Coreg, spironolactone and losartan   #Hypokalemia: Resolved -KDur x 2 doses since he continues to diurese with IV Lasix  #Generalized anxiety disorder:  -Clonazepam 1 mg qhs  FEN: Replace electrolytes as needed, heart healthy diet VTE ppx: Heparin  CODE STATUS: Full code  Dispo: Anticipated transfer to [facility] pending insurance authorization   Yvette Rack, MD 10/21/2018, 5:53 AM Pager: [phone] IMTS PGY-1

## 2018-10-21 NOTE — Progress Notes (Signed)
Patient ID: Justin Posey., male   DOB: 1940-07-11, 78 y.o.   MRN: [id number]    Advanced Heart Failure Rounding Note   Subjective:    Patient remains in NSR on amiodarone.    He is off norepinephrine with SBP in 100s this morning.  Co-ox 66%.  CVP 8 with good diuresis on IV Lasix yesterday.   Oxygen saturation 91-92% on room air currently.  CXR 4/20 with diffuse airspace disease on right. He had mild hemoptysis yesterday, none overnight or today.   Afebrile, WBCs lower.  Remains ceftriaxone/azithromycin.  Blood cultures negative.   He has been up to walk in halls.    Objective:   Weight Range:  Vital Signs:   Temp:  [97.5 F (36.4 C)-98.3 F (36.8 C)] 98 F (36.7 C) (04/21 0809) Pulse Rate:  [51-98] 98 (04/21 0800) Resp:  [13-28] 24 (04/21 0800) BP: (97-175)/(63-129) 105/77 (04/21 0800) SpO2:  [81 %-100 %] 92 % (04/21 0806) Weight:  [128.7 kg] 128.7 kg (04/21 0500) Last BM Date: 10/18/18  Weight change: Filed Weights   10/15/18 1709 10/16/18 0036 10/21/18 0500  Weight: 133.4 kg 132.1 kg 128.7 kg    Intake/Output:   Intake/Output Summary (Last 24 hours) at 10/21/2018 0832 Last data filed at 10/21/2018 0800 Gross per 24 hour  Intake 1847.08 ml  Output 3825 ml  Net -1977.92 ml     Physical Exam: General: NAD Neck: No JVD, no thyromegaly or thyroid nodule.  Lungs: Decrease BS at bases.  CV: Nondisplaced PMI.  Heart regular S1/S2, no S3/S4, no murmur.  Trace ankle edema.   Abdomen: Soft, nontender, no hepatosplenomegaly, no distention.  Skin: Intact without lesions or rashes.  Neurologic: Alert and oriented x 3.  Psych: Normal affect. Extremities: No clubbing or cyanosis.  HEENT: Normal.   Telemetry: NSR 80s Personally reviewed   Labs: Basic Metabolic Panel: Recent Labs  Lab 10/17/18 0255 10/18/18 0259 10/19/18 0321 10/20/18 0325 10/21/18 0417  NA 136 132* 133* 135 133*  K 3.6 3.5 3.7 3.1* 3.5  CL 96* 96* 98 104 94*  CO2 31 25 25  21* 25   GLUCOSE 139* 134* 132* 237* 165*  BUN 23 30* 29* 25* 30*  CREATININE 1.44* 1.44* 1.44* 1.13 1.37*  CALCIUM 8.6* 8.3* 8.3* 7.0* 8.3*  MG  --   --  2.3  --  2.0    Liver Function Tests: Recent Labs  Lab 10/16/18 0250  AST 163*  ALT 37  ALKPHOS 75  BILITOT 1.2  PROT 7.0  ALBUMIN 3.9   No results for input(s): LIPASE, AMYLASE in the last 168 hours. No results for input(s): AMMONIA in the last 168 hours.  CBC: Recent Labs  Lab 10/15/18 2051  10/17/18 0255 10/18/18 0259 10/19/18 0321 10/20/18 0325 10/21/18 0417  WBC 17.2*   < > 22.3* 17.4* 15.6* 22.4* 17.0*  NEUTROABS 15.0*  --   --   --   --   --   --   HGB 15.1   < > 14.6 13.4 12.1* 12.1* 12.9*  HCT 46.3   < > 44.1 40.8 38.6* 37.0* 39.2  MCV 93.3   < > 93.8 93.2 93.9 92.7 92.0  PLT 291   < > 289 258 267 356 327   < > = values in this interval not displayed.    Cardiac Enzymes: Recent Labs  Lab 10/15/18 1722 10/15/18 2022 10/16/18 0250 10/16/18 0843 10/16/18 1608  TROPONINI 0.05* 1.40* 12.54* 15.50* >65.00*    BNP:  BNP (last 3 results) No results for input(s): BNP in the last 8760 hours.  ProBNP (last 3 results) No results for input(s): PROBNP in the last 8760 hours.    Other results:  Imaging: Dg Chest Port 1 View  Result Date: 10/20/2018 CLINICAL DATA:  Shortness of breath.  Respiratory failure. EXAM: PORTABLE CHEST 1 VIEW COMPARISON:  October 19, 2018 FINDINGS: Right greater than left asymmetric pulmonary infiltrates are stable. A right PICC line terminates in the central SVC, new in the interval. No pneumothorax. No change in the cardiomediastinal silhouette. IMPRESSION: 1. The new right PICC line terminates in the central SVC. 2. Right greater than left pulmonary infiltrates are stable. 3. No other changes. Electronically Signed   By: Gerome Sam III M.D   On: 10/20/2018 10:01   Dg Chest Port 1 View  Result Date: 10/19/2018 CLINICAL DATA:  Shortness of breath. EXAM: PORTABLE CHEST 1 VIEW  COMPARISON:  10/18/2018 FINDINGS: The cardiac silhouette remains mildly enlarged. They are worsening airspace opacities throughout the right lung. Left basilar airspace opacities are unchanged. Small pleural effusions are not excluded. No pneumothorax is identified. IMPRESSION: Worsening asymmetric airspace disease throughout the right lung. Electronically Signed   By: Sebastian Ache M.D.   On: 10/19/2018 10:19   [place] Ekg Site Rite  Result Date: 10/19/2018 If Site Rite image not attached, placement could not be confirmed due to current cardiac rhythm.    Medications:     Scheduled Medications: . alum & mag hydroxide-simeth  30 mL Oral Once   And  . lidocaine  15 mL Oral Once  . amiodarone  200 mg Oral BID  . arformoterol  15 mcg Nebulization BID  . aspirin  81 mg Oral Daily  . atorvastatin  80 mg Oral q1800  . azithromycin  250 mg Oral Daily  . budesonide (PULMICORT) nebulizer solution  0.25 mg Nebulization BID  . carvedilol  3.125 mg Oral BID WC  . Chlorhexidine Gluconate Cloth  6 each Topical Daily  . clonazePAM  1 mg Oral QHS  . FLUoxetine  60 mg Oral Daily  . lamoTRIgine  75 mg Oral QHS  . pantoprazole  40 mg Oral Daily  . potassium chloride  40 mEq Oral BID  . sodium chloride flush  10-40 mL Intracatheter Q12H  . spironolactone  12.5 mg Oral Daily  . spironolactone  12.5 mg Oral Daily  . tamsulosin  0.4 mg Oral QHS  . ticagrelor  90 mg Oral BID  . torsemide  40 mg Oral Daily    Infusions: . sodium chloride Stopped (10/21/18 0528)  . sodium chloride Stopped (10/19/18 1353)  . cefTRIAXone (ROCEPHIN)  IV Stopped (10/20/18 0940)  . heparin 2,050 Units/hr (10/21/18 0800)  . nitroGLYCERIN Stopped (10/16/18 1151)  . norepinephrine (LEVOPHED) Adult infusion Stopped (10/20/18 1116)    PRN Medications: sodium chloride, acetaminophen, albuterol, morphine injection, ondansetron (ZOFRAN) IV, sodium chloride flush    Coronary Diagrams   Diagnostic  Dominance: Right     Intervention     Assessment:   Justin Sanfilippo. is a 78 y.o. male with a hx of HTN, HLD, BPH and obesitywho was admitted with NSTEMI and found to have totally occluded LAD s/p DES on 4/16. Post MI care complicated by acute systolic HF with EF 30%   Plan/Discussion:     1.  CAD/NSTEMI: Peak trop > 65. Cath 4/16 with total occlusion of LAD -> s/p DES (55% residual stenosis due to heavy calcification).  Residual CAD  with LCx 60 and RCA 20%. EF 30% with LVEDP 27.  - Continue DAPT.  - Continue atorvastatin 80 daily.  - Out of bed.  2. Acute systolic HF: Due to LAD infarct. Echo 10/16/18 EF 30-35%. CVP 8 this morning with co-ox 66%, now off norepinephrine. Creatinine mildly higher with diuresis at 1.37.   - Stop IV Lasix, start Lasix 40 mg po daily for now.  - Can restart low doses of spironolactone and Coreg.  3. Acute hypoxic respiratory failure: Has COPD but not on oxygen at home.  CXR with asymmetric diffuse right-sided airspace disease on 4/20.  Had some hemoptysis but this has resolved.  WBCs lower at 17.  Afebrile. ?Aspiration, ?viral pneumonitis.  COVID-19 negative x 2, viral panel negative, blood cultures NGTD. Oxygen saturation now better with diuresis yesterday, 91-92% on room air currently.  - Continue ceftriaxone/azithromycin per pulmonary.  - He had 1 day of prednisone but this was stopped.   4.  COPD:  Recently evaluated by pulmonary medicine and is now treated for COPD with Breo and Ventolin. Not on home oxygen.  - Management per IM/pulmonary.  5. AKI: ? Shock vs CIN vs diuresis. Resolved.  6. Hypokalemia: Replace K.  7. Atrial fibrillation: First noted on 4/19, now back in NSR today on amiodarone gtt.  He is also now on heparin gtt (+ ASA 81 and ticagrelor).  Given only brief atrial fibrillation and need for DAPT, would not commit him to triple therapy yet.  Would continue heparin gtt for now, follow him for recurrent atrial fibrillation.  - Continue amiodarone (1 month if  no further atrial fibrillation).  - Heparin gtt for now, transition to Eliquis + Plavix if further atrial fibrillation noted.  Stop heparin and use ticagrelor + ASA if no further afib.   I think he can go to step down today.   CRITICAL CARE Performed by: Marca Ancona  Total critical care time: 35 minutes  Critical care time was exclusive of separately billable procedures and treating other patients.  Critical care was necessary to treat or prevent imminent or life-threatening deterioration.  Critical care was time spent personally by me (independent of midlevel providers or residents) on the following activities: development of treatment plan with patient and/or surrogate as well as nursing, discussions with consultants, evaluation of patient's response to treatment, examination of patient, obtaining history from patient or surrogate, ordering and performing treatments and interventions, ordering and review of laboratory studies, ordering and review of radiographic studies, pulse oximetry and re-evaluation of patient's condition.   Length of Stay: 6  Marca Ancona MD 10/21/2018, 8:32 AM  Advanced Heart Failure Team Pager [phone] (M-F; 7a - 4p)  Please contact [facility] for night-coverage after hours (4p -7a ) and weekends on [email or web address]

## 2018-10-22 DIAGNOSIS — J9601 Acute respiratory failure with hypoxia: Secondary | ICD-10-CM

## 2018-10-22 LAB — BASIC METABOLIC PANEL
Anion gap: 12 (ref 5–15)
BUN: 27 mg/dL — ABNORMAL HIGH (ref 8–23)
CO2: 25 mmol/L (ref 22–32)
Calcium: 8.5 mg/dL — ABNORMAL LOW (ref 8.9–10.3)
Chloride: 97 mmol/L — ABNORMAL LOW (ref 98–111)
Creatinine, Ser: 1.22 mg/dL (ref 0.61–1.24)
GFR calc Af Amer: >60 mL/min (ref >60)
GFR calc non Af Amer: 57 mL/min — ABNORMAL LOW (ref >60)
Glucose, Bld: 121 mg/dL — ABNORMAL HIGH (ref 70–99)
Potassium: 3.9 mmol/L (ref 3.5–5.1)
Sodium: 134 mmol/L — ABNORMAL LOW (ref 135–145)

## 2018-10-22 LAB — CBC
HCT: 39.5 % (ref 39.0–52.0)
Hemoglobin: 13 g/dL (ref 13.0–17.0)
MCH: 30.4 pg (ref 26.0–34.0)
MCHC: 32.9 g/dL (ref 30.0–36.0)
MCV: 92.3 fL (ref 80.0–100.0)
Platelets: 330 10*3/uL (ref 150–400)
RBC: 4.28 MIL/uL (ref 4.22–5.81)
RDW: 13.2 % (ref 11.5–15.5)
WBC: 15.6 10*3/uL — ABNORMAL HIGH (ref 4.0–10.5)
nRBC: 0 % (ref 0.0–0.2)

## 2018-10-22 LAB — COOXEMETRY PANEL
Carboxyhemoglobin: 1.3 % (ref 0.5–1.5)
Methemoglobin: 1.3 % (ref 0.0–1.5)
O2 Saturation: 63.5 %
Total hemoglobin: 14 g/dL (ref 12.0–16.0)

## 2018-10-22 LAB — HEPARIN LEVEL (UNFRACTIONATED): Heparin Unfractionated: 0.51 IU/mL (ref 0.30–0.70)

## 2018-10-22 MED ORDER — LOSARTAN POTASSIUM 25 MG PO TABS
12.5000 mg | ORAL_TABLET | Freq: Every day | ORAL | Status: DC
  Administered 2018-10-22 – 2018-10-27 (×6): 12.5 mg via ORAL
  Filled 2018-10-22 (×6): qty 1

## 2018-10-22 NOTE — Progress Notes (Addendum)
   Subjective: HD#7   Overnight: sPO2 87% at 2am  Today, he reports that he was able to walk to the garden yesterday and was able to go to the bathroom, for which he is very grateful. His appetite seems to be improving, but still not back to normal. He feels better every day. Congratulated him on his progress. He stated "I think I'm going to leave here today!"  Objective:  Vital signs in last 24 hours: Vitals:   10/22/18 0200 10/22/18 0300 10/22/18 0400 10/22/18 0500  BP:      Pulse: 73 69    Resp: 20 20    Temp:   98.4 F (36.9 C)   TempSrc:   Axillary   SpO2: (!) 87% 92%    Weight:    130.9 kg  Height:       Const: In NAD, lying comfortably in bed Resp: Mild rales still noticeable R>L. Decreased inspiratory effort CV: RRR, no MGR  Assessment/Plan:  Principal Problem:   Acute anterior myocardial infarction [facility]) Active Problems:   Chest pain   COPD exacerbation (HCC)   Atypical pneumonia   Elevated troponin   Acute systolic heart failure (HCC)   Status post coronary artery stent placement   Myocardial infarction involving left anterior descending (LAD) coronary artery (HCC)   Supplemental oxygen dependent   Essential hypertension   Steroid-induced hyperglycemia   Diabetes mellitus type 2 in obese (HCC)   Tobacco abuse   Leukocytosis   Hyponatremia  Mr.Fehring is a 78 yo M w/ PMH of HTN, HLD, T2DM, BPH, obesitywhopresentedwith acute onsetchest painfound to have Acute anterior MI with 100% occlusion ofproximalLED s/p DES. Also here for management of Acute HFrEF, Hypoxic respiratory failure, CAP and A-Fibb.  #Acute anterior MI: Stable clinical course since last examined yesterday. Continue to remain asymptomatic. -Continue aspirin 81 mg daily, Brilinta -Continue Lipitor 80 mg daily -ContinueCoreg -Appreciate cardiology recommendation  #AcuteHFrEF: No overt sign of volume overload with exception of minimal rales at the posterior lower lobes R>L.  Responded well to oral lasix yesterday with total UOP 1.8L (net negative I/O 635cc)> Though hit weight is up 2kg from yesterday, this might be an error as he has been diuresis appropriately.  CVP ~4, Co-ox of 63% (65% yesterday)  -Continue PO Lasix 40 mg daily -Strict intake and output -ContinueCoreg, spironolactone and losartan  -Continue cardiac monitoring  #New onset A. fib with RVR-resolved: Continue to remain in SR.  -Continue oral amiodarone  -Continue DAPT + heparin for now -If he converts back to A-fibb, will transition to Eliquis + plavix  #Acute hypoxic respiratory failure:This has improved with gradual decrease in supplemental oxygen requirement. Currently SpO2 is 92% on 2L [place].  - Continue Rocephin(day 5) - Continue nebulizer treatment - Supplemental O2 prn   #Acute kidney injury-resolved -Continue to monitor  #Parkinsonian features: -Continue lamotrigine and fluoxetine  #COPD -Continue nebulizers withpulmicort, brovana  #Hypertension -BP stable  -ContinueCoreg, spironolactone and losartan   #Hypokalemia: Resolved  #Generalized anxiety disorder:  -Clonazepam 1 mg qhs  ZMO:QHUTMLY electrolytes as needed, heart healthy diet VTE YTK:[id number] CODE STATUS:Full code  Dispo: Anticipated transfer to [facility] pending insurance authorization   Yvette Rack, MD 10/22/2018, 6:05 AM Pager: [phone] IMTS PGY-1

## 2018-10-22 NOTE — H&P (Signed)
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 One level home with ramped entrance. He has 2 sisters reportedly live out of town. 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 Presented 10/15/2018 with chest pain and shortness of breath. Workup revealed troponin 0.05 trending up to 12.54, lactic acid 1.7, potassium 3.4 and WBC 16,600-24,600 felt to be reactive without any signs of systemic infection . Chest x-ray COPD without acute airspace disease.viral panel negative. Blood cultures no growth to date. CT angiogram of the chest no evidence of pulmonary emboli. EKG showed mild ST elevation in the anterior leads suspect non-STEMI. Echocardiogram with ejection fraction 35% moderate to severe reduced systolic function. Cardiac catheterization completed 10/16/2018 by Dr. Burt Knack with total occlusion LAD and underwent stent placement. Required norepinephrine for blood pressure control as well as IV Lasix for diuresis for acute systolic congestive heart failure. Bouts of atrial fibrillation requiring amiodarone.Maintained on IV heparin transitioned to Brilinta as well as low-dose aspirin with subcutaneous Lovenox. Hospital course compromised by hypoxic respiratory failure with right greater than left pulmonary infiltrates suspect CAP coverage 5 day total antibiotics. Patient still in need of oxygen therapy needing as much as 6 L oxygen to keep saturations  greater than 90% with ambulation as well as nebulizer treatments. Tolerating a regular diet. Therapy evaluations completed and patient was admitted for a comprehensive rehabilitation program.  Review of Systems  Constitutional: Positive for malaise/fatigue. Negative for chills and fever.  HENT: Negative for hearing loss.   Eyes: Negative for blurred vision and double vision.  Respiratory: Positive for shortness of breath. Negative for cough.   Cardiovascular: Positive for palpitations and leg swelling. Negative for chest pain.  Gastrointestinal: Positive for constipation. Negative for heartburn, nausea and vomiting.  Genitourinary: Positive for urgency. Negative for dysuria, flank pain and hematuria.  Musculoskeletal: Positive for myalgias.  Skin: Negative for rash.  Neurological: Positive for weakness.  All other systems reviewed and are negative.  Past Medical History:  Diagnosis Date   Hypertension    Peripheral neuropathy    Past Surgical History:  Procedure Laterality Date   CORONARY STENT INTERVENTION N/A 10/16/2018   Procedure: CORONARY STENT INTERVENTION;  Surgeon: Troy Sine, MD;  Location: [facility];  Service: Cardiovascular;  Laterality: N/A;   KNEE SURGERY     LEFT HEART CATH AND CORONARY ANGIOGRAPHY N/A 10/16/2018   Procedure: LEFT HEART CATH AND CORONARY ANGIOGRAPHY;  Surgeon: Troy Sine, MD;  Location: [facility];  Service: Cardiovascular;  Laterality: N/A;   History reviewed. No pertinent family history. Social History:  reports that he has quit smoking. He has never used smokeless tobacco. He reports current alcohol use. He reports that he does not use drugs. Allergies: No Known Allergies Medications Prior to Admission  Medication Sig Dispense Refill   aspirin EC 81 MG tablet Take 81 mg by mouth daily.     BREO ELLIPTA 100-25 MCG/INH AEPB Inhale 1 puff into the lungs  daily.     FLUoxetine (PROZAC) 20 MG capsule Take 60 mg by mouth  every morning.     hydrochlorothiazide (HYDRODIURIL) 50 MG tablet Take 50 mg by mouth every morning.      lamoTRIgine (LAMICTAL) 25 MG tablet Take 75 mg by mouth at bedtime.     losartan (COZAAR) 25 MG tablet Take 50 mg by mouth every morning.     metFORMIN (GLUCOPHAGE) 500 MG tablet Take 250 mg by mouth every morning.      omeprazole (PRILOSEC) 20 MG capsule Take 20 mg by mouth every morning.     simvastatin (ZOCOR) 40 MG tablet Take 40 mg by mouth every morning.      spironolactone (ALDACTONE) 25 MG tablet Take 25 mg by mouth 2 (two) times daily.      tamsulosin (FLOMAX) 0.4 MG CAPS capsule Take 0.4 mg by mouth at bedtime.      VENTOLIN HFA 108 (90 Base) MCG/ACT inhaler Inhale 1 puff into the lungs every 4 (four) hours as needed for wheezing.     metoprolol succinate (TOPROL-XL) 50 MG 24 hr tablet Take 50 mg by mouth daily.      Drug Regimen Review Drug regimen was reviewed and remains appropriate with no significant issues identified  Home: Home Living Family/patient expects to be discharged to:: Private residence Living Arrangements: Alone Available Help at Discharge: Family, Available PRN/intermittently Type of Home: House Home Access: Ramped entrance Home Layout: One level Bathroom Shower/Tub: Curtain, Chiropodist: Standard Bathroom Accessibility: Yes Home Equipment: Toilet riser, Grab bars - tub/shower, Hand held shower head  Lives With: Alone   Functional History: Prior Function Level of Independence: Independent Comments: 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    Functional Status:  Mobility: Bed Mobility Overal bed mobility: Needs Assistance Bed Mobility: Supine to Sit Supine to sit: Min assist General bed mobility comments: pulled up on therapist's hand, use of rail, HOB up 20 degrees Transfers Overall transfer level: Needs assistance Equipment used: Rolling walker (2 wheeled) Transfers: Sit to/from Stand Sit to  Stand: Min guard General transfer comment: cues for hand placement, increased time and effort Ambulation/Gait Ambulation/Gait assistance: Min guard Gait Distance (Feet): 110 Feet Assistive device: Rolling walker (2 wheeled) Gait Pattern/deviations: Step-through pattern, Decreased stride length, Trunk flexed General Gait Details: Pt with cues for posture and position in RW. Pt fatigued with gait and unable to tolerate further gait Gait velocity interpretation: >2.62 ft/sec, indicative of community ambulatory    ADL: ADL Overall ADL's : Needs assistance/impaired Grooming: Min guard, Standing, Oral care Upper Body Bathing: Minimal assistance, Sitting Lower Body Bathing: Moderate assistance, Sit to/from stand Upper Body Dressing : Minimal assistance, Sitting Lower Body Dressing: Moderate assistance, Sit to/from stand Lower Body Dressing Details (indicate cue type and reason): able to figure 4 with R LE but not L LE  Toilet Transfer: Moderate assistance, Ambulation, +2 for safety/equipment Toilet Transfer Details (indicate cue type and reason): simulated to/from recliner  Toileting- Clothing Manipulation and Hygiene: Moderate assistance, Sit to/from stand Functional mobility during ADLs: Minimal assistance, Rolling walker General ADL Comments: pt appears SOB with talking, but Sp02 remained in mid 90s  Cognition: Cognition Overall Cognitive Status: Within Functional Limits for tasks assessed Orientation Level: Oriented X4 Cognition Arousal/Alertness: Awake/alert Behavior During Therapy: WFL for tasks assessed/performed Overall Cognitive Status: Within Functional Limits for tasks assessed  Physical Exam: Blood pressure 94/62, pulse 88, temperature 98.7 F (37.1 C), temperature source Oral, resp. rate Marland Kitchen)  23, height 6' (1.829 m), weight 125.9 kg, SpO2 94 %. Physical Exam  Constitutional: He appears well-developed and well-nourished. No distress.  HENT:  Head: Normocephalic and  atraumatic.  Eyes: Pupils are equal, round, and reactive to light. EOM are normal.  Neck: No tracheal deviation present. No thyromegaly present.  Cardiovascular: Normal rate and regular rhythm. Exam reveals no friction rub.  No murmur heard. Respiratory: Effort normal. No respiratory distress. He has no wheezes.  Decreased breath sounds at bases  GI: Soft. He exhibits no distension. There is no abdominal tenderness.  Musculoskeletal: Normal range of motion.        General: No edema.  Neurological:  Patient is alert sitting at the edge of the bed. Provides his name, age and date of birth. Follows full commands. Fair insight and awareness of deficits. Normal language, functional memory, insight and awareness. UE 4/5 prox to distal. LE: 3+HF, 4- KE and 4/5 ADF/PF. No sensory findings  Skin: Skin is warm and dry.  Psychiatric:  Pleasant, cooperative, verbose    Results for orders placed or performed during the hospital encounter of 10/15/18 (from the past 48 hour(s))  Basic metabolic panel     Status: Abnormal   Collection Time: 10/25/18  8:18 AM  Result Value Ref Range   Sodium 132 (L) 135 - 145 mmol/L   Potassium 3.6 3.5 - 5.1 mmol/L   Chloride 99 98 - 111 mmol/L   CO2 20 (L) 22 - 32 mmol/L   Glucose, Bld 205 (H) 70 - 99 mg/dL   BUN 24 (H) 8 - 23 mg/dL   Creatinine, Ser 1.33 (H) 0.61 - 1.24 mg/dL   Calcium 8.5 (L) 8.9 - 10.3 mg/dL   GFR calc non Af Amer 52 (L) >60 mL/min   GFR calc Af Amer 60 (L) >60 mL/min   Anion gap 13 5 - 15    Comment: Performed at [facility], [address], [place], [place] [address]  CBC     Status: Abnormal   Collection Time: 10/25/18  8:18 AM  Result Value Ref Range   WBC 16.4 (H) 4.0 - 10.5 K/uL   RBC 4.14 (L) 4.22 - 5.81 MIL/uL   Hemoglobin 12.4 (L) 13.0 - 17.0 g/dL   HCT 37.8 (L) 39.0 - 52.0 %   MCV 91.3 80.0 - 100.0 fL   MCH 30.0 26.0 - 34.0 pg   MCHC 32.8 30.0 - 36.0 g/dL   RDW 13.2 11.5 - 15.5 %   Platelets 383 150 - 400 K/uL   nRBC  0.0 0.0 - 0.2 %    Comment: Performed at [facility], [place] [address], [place], [place] [address]  .Cooxemetry Panel (carboxy, met, total hgb, O2 sat)     Status: Abnormal   Collection Time: 10/26/18  4:55 AM  Result Value Ref Range   Total hemoglobin 13.4 12.0 - 16.0 g/dL   O2 Saturation 64.8 %   Carboxyhemoglobin 1.7 (H) 0.5 - 1.5 %   Methemoglobin 0.9 0.0 - 1.5 %  Basic metabolic panel     Status: Abnormal   Collection Time: 10/26/18  5:00 AM  Result Value Ref Range   Sodium 136 135 - 145 mmol/L   Potassium 4.1 3.5 - 5.1 mmol/L   Chloride 101 98 - 111 mmol/L   CO2 22 22 - 32 mmol/L   Glucose, Bld 137 (H) 70 - 99 mg/dL   BUN 23 8 - 23 mg/dL   Creatinine, Ser 1.59 (H)  0.61 - 1.24 mg/dL   Calcium 8.7 (L) 8.9 - 10.3 mg/dL   GFR calc non Af Amer 42 (L) >60 mL/min   GFR calc Af Amer 48 (L) >60 mL/min   Anion gap 13 5 - 15    Comment: Performed at [facility] [address], [place], [place] [address]  CBC     Status: Abnormal   Collection Time: 10/26/18  5:00 AM  Result Value Ref Range   WBC 19.2 (H) 4.0 - 10.5 K/uL   RBC 4.29 4.22 - 5.81 MIL/uL   Hemoglobin 12.9 (L) 13.0 - 17.0 g/dL   HCT 39.9 39.0 - 52.0 %   MCV 93.0 80.0 - 100.0 fL   MCH 30.1 26.0 - 34.0 pg   MCHC 32.3 30.0 - 36.0 g/dL   RDW 13.2 11.5 - 15.5 %   Platelets 411 (H) 150 - 400 K/uL   nRBC 0.0 0.0 - 0.2 %    Comment: Performed at [facility], [place] [address] [place], [place] [address]   No results found.     Medical Problem List and Plan: 1.   Debility secondary to CAD/non-STEMI status post cardiac catheterization with stenting complicated by hypoxic respiratory failure/CAP/COPD  -admit to inpatient rehab 2.  Antithrombotics: -DVT/anticoagulation:  Lovenox   -antiplatelet therapy: aspirin 81 mg daily,Brilinta 90 mg twice a day 3. Pain Management:  Tylenol as needed 4. Mood: Klonopin 1 mg daily at bedtime, Prozac 60 mg daily,Lamictal 75 mg daily  -antipsychotic agents: N/A 5.  Neuropsych: This patient is capable of making decisions on his own behalf. 6. Skin/Wound Care:  Routine skin checks 7. Fluids/Electrolytes/Nutrition:  Routine in and out's with follow-up chemistries 8. Atrial fibrillation.Amiodarone 200 mg twice a day 1 month. Cardiac rate controlled 9. Hypertension. Coreg 3.125 mg twice a day, Aldactone 12.5 mg daily, Cozaar 12.5 mg daily 10. Acute systolic congestive heart failure. Lasix 80 mg daily.  - Monitor for any signs of fluid overload.   -daily weights   -Follow-up as outpt with cardiology services 11.AKI. Resolved.   -follow-up chemistries on admit 12. Diabetes mellitus. Blood sugar checks currently discontinued. Patient was on Glucophage 250 mg daily prior to admission. 13. Reactive leukocytosis. WBC  consistently elevated without signs of systemic infection. Could be secondary to recent MI. May require workup as an outpatient 14. COPD/CAP with tobacco abuse. Counseling. Wean of oxygen. Complete course of Zithromax and Rocephin 15. Hyperlipidemia. Lipitor 16. BPH. Flomax      Lavon Paganini Angiulli, PA-C 10/27/2018

## 2018-10-22 NOTE — Progress Notes (Signed)
Patient ID: Molli PoseyBruce D Sansone Jr., male   DOB: 05/23/1941, 78 y.o.   MRN: [id number]    Advanced Heart Failure Rounding Note   Subjective:    Patient remains in NSR on amiodarone, no further atrial fibrillation.    He is off norepinephrine with SBP in 110s this morning.  Co-ox 63%.  CVP 6-7, now on po Lasix.   Oxygen saturation 90s on 2L [place] currently.  CXR 4/20 with diffuse airspace disease on right. No further hemoptysis since 4/20.   Afebrile, WBCs trending down.  He has completed azithromycin and got dose of ceftriaxone this morning.  Blood cultures negative.   He has been up to walk in halls.    Objective:   Weight Range:  Vital Signs:   Temp:  [98 F (36.7 C)-98.5 F (36.9 C)] 98.1 F (36.7 C) (04/22 0751) Pulse Rate:  [68-79] 76 (04/22 0800) Resp:  [18-26] 25 (04/22 0800) BP: (100-127)/(61-75) 100/70 (04/22 0800) SpO2:  [87 %-99 %] 97 % (04/22 0800) FiO2 (%):  [28 %] 28 % (04/22 0759) Weight:  [130.9 kg] 130.9 kg (04/22 0500) Last BM Date: 10/22/18  Weight change: Filed Weights   10/16/18 0036 10/21/18 0500 10/22/18 0500  Weight: 132.1 kg 128.7 kg 130.9 kg    Intake/Output:   Intake/Output Summary (Last 24 hours) at 10/22/2018 0916 Last data filed at 10/22/2018 0748 Gross per 24 hour  Intake 1219.24 ml  Output 1525 ml  Net -305.76 ml     Physical Exam: General: NAD Neck: No JVD, no thyromegaly or thyroid nodule.  Lungs: Clear to auscultation bilaterally with normal respiratory effort. CV: Nondisplaced PMI.  Heart regular S1/S2, no S3/S4, no murmur.  No peripheral edema.   Abdomen: Soft, nontender, no hepatosplenomegaly, no distention.  Skin: Intact without lesions or rashes.  Neurologic: Alert and oriented x 3.  Psych: Normal affect. Extremities: No clubbing or cyanosis.  HEENT: Normal.    Telemetry: NSR 70s Personally reviewed   Labs: Basic Metabolic Panel: Recent Labs  Lab 10/18/18 0259 10/19/18 0321 10/20/18 0325 10/21/18 0417 10/22/18  0500  NA 132* 133* 135 133* 134*  K 3.5 3.7 3.1* 3.5 3.9  CL 96* 98 104 94* 97*  CO2 25 25 21* 25 25  GLUCOSE 134* 132* 237* 165* 121*  BUN 30* 29* 25* 30* 27*  CREATININE 1.44* 1.44* 1.13 1.37* 1.22  CALCIUM 8.3* 8.3* 7.0* 8.3* 8.5*  MG  --  2.3  --  2.0  --     Liver Function Tests: Recent Labs  Lab 10/16/18 0250  AST 163*  ALT 37  ALKPHOS 75  BILITOT 1.2  PROT 7.0  ALBUMIN 3.9   No results for input(s): LIPASE, AMYLASE in the last 168 hours. No results for input(s): AMMONIA in the last 168 hours.  CBC: Recent Labs  Lab 10/15/18 2051  10/18/18 0259 10/19/18 0321 10/20/18 0325 10/21/18 0417 10/22/18 0500  WBC 17.2*   < > 17.4* 15.6* 22.4* 17.0* 15.6*  NEUTROABS 15.0*  --   --   --   --   --   --   HGB 15.1   < > 13.4 12.1* 12.1* 12.9* 13.0  HCT 46.3   < > 40.8 38.6* 37.0* 39.2 39.5  MCV 93.3   < > 93.2 93.9 92.7 92.0 92.3  PLT 291   < > 258 267 356 327 330   < > = values in this interval not displayed.    Cardiac Enzymes: Recent Labs  Lab 10/15/18  1722 10/15/18 2022 10/16/18 0250 10/16/18 0843 10/16/18 1608  TROPONINI 0.05* 1.40* 12.54* 15.50* >65.00*    BNP: BNP (last 3 results) No results for input(s): BNP in the last 8760 hours.  ProBNP (last 3 results) No results for input(s): PROBNP in the last 8760 hours.    Other results:  Imaging: Dg Chest Port 1 View  Result Date: 10/20/2018 CLINICAL DATA:  Shortness of breath.  Respiratory failure. EXAM: PORTABLE CHEST 1 VIEW COMPARISON:  October 19, 2018 FINDINGS: Right greater than left asymmetric pulmonary infiltrates are stable. A right PICC line terminates in the central SVC, new in the interval. No pneumothorax. No change in the cardiomediastinal silhouette. IMPRESSION: 1. The new right PICC line terminates in the central SVC. 2. Right greater than left pulmonary infiltrates are stable. 3. No other changes. Electronically Signed   By: Gerome Sam III M.D   On: 10/20/2018 10:01      Medications:     Scheduled Medications: . alum & mag hydroxide-simeth  30 mL Oral Once   And  . lidocaine  15 mL Oral Once  . amiodarone  200 mg Oral BID  . arformoterol  15 mcg Nebulization BID  . aspirin  81 mg Oral Daily  . atorvastatin  80 mg Oral q1800  . budesonide (PULMICORT) nebulizer solution  0.25 mg Nebulization BID  . carvedilol  3.125 mg Oral BID WC  . Chlorhexidine Gluconate Cloth  6 each Topical Daily  . clonazePAM  1 mg Oral QHS  . FLUoxetine  60 mg Oral Daily  . furosemide  40 mg Oral Daily  . lamoTRIgine  75 mg Oral QHS  . losartan  12.5 mg Oral Daily  . pantoprazole  40 mg Oral Daily  . sodium chloride flush  10-40 mL Intracatheter Q12H  . spironolactone  12.5 mg Oral Daily  . tamsulosin  0.4 mg Oral QHS  . ticagrelor  90 mg Oral BID    Infusions: . sodium chloride Stopped (10/22/18 0748)  . cefTRIAXone (ROCEPHIN)  IV 2 g (10/22/18 0748)  . heparin 2,050 Units/hr (10/22/18 0748)  . nitroGLYCERIN Stopped (10/16/18 1151)    PRN Medications: sodium chloride, acetaminophen, albuterol, morphine injection, ondansetron (ZOFRAN) IV, sodium chloride flush    Coronary Diagrams   Diagnostic  Dominance: Right    Intervention     Assessment:   Shoichi Vancott. is a 78 y.o. male with a hx of HTN, HLD, BPH and obesitywho was admitted with NSTEMI and found to have totally occluded LAD s/p DES on 4/16. Post MI care complicated by acute systolic HF with EF 30%   Plan/Discussion:     1.  CAD/NSTEMI: Peak trop > 65. Cath 4/16 with total occlusion of LAD -> s/p DES (55% residual stenosis due to heavy calcification).  Residual CAD with LCx 60% and RCA 20%. EF 30% with LVEDP 27.  - Continue DAPT.  - Continue atorvastatin 80 daily.  - Consider Lifevest at discharge with large anterior MI c/b shock.   2. Acute systolic HF: Due to LAD infarct. Echo 10/16/18 EF 30-35%. CVP 6-7 this morning with co-ox 63%, now off norepinephrine. Creatinine stable.    - Continue  Lasix 40 mg po daily.  - Continue low doses of spironolactone and Coreg.  - Start losartan 12.5 mg daily.  Eventually transition to [facility] if BP tolerates.  3. Acute hypoxic respiratory failure: Has COPD but not on oxygen at home.  CXR with asymmetric diffuse right-sided airspace disease on 4/20.  Had some hemoptysis but this has resolved.  WBCs lower at 15.  Afebrile. ?Aspiration, ?viral pneumonitis.  COVID-19 negative x 2, viral panel negative, blood cultures NGTD. Oxygen saturation now better with diuresis but still requiring 2L [place] after full diuresis (CVP 6-7).  No wheezing on exam. - Think we can stop abx today, has completed course for CAP.   - He had 1 day of prednisone but this was stopped.   4.  COPD:  Recently evaluated by pulmonary medicine and is now treated for COPD with Breo and Ventolin. Not on home oxygen prior to admission.  - Management per IM/pulmonary.  5. AKI: ? Shock vs CIN vs diuresis. Resolved.  6. Atrial fibrillation: First noted on 10/19/18, now back in NSR today on amiodarone.  He is also now on heparin gtt (+ ASA 81 and ticagrelor).  Given only brief atrial fibrillation and need for DAPT, would not commit him to triple therapy at this.  Would continue heparin gtt for now, if no recurrent atrial fibrillation would not send home on anticoagulation.   - Continue amiodarone (1 month if no further atrial fibrillation).  - Heparin gtt for now, transition to Eliquis + Plavix if further atrial fibrillation noted.  Stop heparin and use ticagrelor + ASA if no further afib.   Ready to transfer out of unit.   Length of Stay: 7  Marca Ancona MD 10/22/2018, 9:16 AM  Advanced Heart Failure Team Pager [phone] (M-F; 7a - 4p)  Please contact [facility] for night-coverage after hours (4p -7a ) and weekends on [email or web address]

## 2018-10-22 NOTE — Progress Notes (Signed)
ANTICOAGULATION CONSULT NOTE - Follow-Up Consult  Pharmacy Consult for Heparin Indication: atrial fibrillation  No Known Allergies  Patient Measurements: Height: 6' (182.9 cm) Weight: 288 lb 9.3 oz (130.9 kg) IBW/kg (Calculated) : 77.6 HEPARIN DW (KG): 107.9   Vital Signs: Temp: 98.4 F (36.9 C) (04/22 0400) Temp Source: Axillary (04/22 0400) BP: 113/69 (04/22 0600) Pulse Rate: 76 (04/22 0600)  Labs: Recent Labs    10/20/18 0325  10/20/18 1745 10/21/18 0417 10/22/18 0500  HGB 12.1*  --   --  12.9* 13.0  HCT 37.0*  --   --  39.2 39.5  PLT 356  --   --  327 330  HEPARINUNFRC  --    < > 0.38 0.48 0.51  CREATININE 1.13  --   --  1.37* 1.22   < > = values in this interval not displayed.    Estimated Creatinine Clearance: 72.1 mL/min (by C-G formula based on SCr of 1.22 mg/dL).   Medical History: Past Medical History:  Diagnosis Date  . Hypertension   . Peripheral neuropathy     Medications:  Scheduled:  . alum & mag hydroxide-simeth  30 mL Oral Once   And  . lidocaine  15 mL Oral Once  . amiodarone  200 mg Oral BID  . arformoterol  15 mcg Nebulization BID  . aspirin  81 mg Oral Daily  . atorvastatin  80 mg Oral q1800  . budesonide (PULMICORT) nebulizer solution  0.25 mg Nebulization BID  . carvedilol  3.125 mg Oral BID WC  . Chlorhexidine Gluconate Cloth  6 each Topical Daily  . clonazePAM  1 mg Oral QHS  . FLUoxetine  60 mg Oral Daily  . furosemide  40 mg Oral Daily  . lamoTRIgine  75 mg Oral QHS  . pantoprazole  40 mg Oral Daily  . sodium chloride flush  10-40 mL Intracatheter Q12H  . spironolactone  12.5 mg Oral Daily  . tamsulosin  0.4 mg Oral QHS  . ticagrelor  90 mg Oral BID   Infusions:  . sodium chloride 10 mL/hr at 10/22/18 0600  . cefTRIAXone (ROCEPHIN)  IV Stopped (10/21/18 9191)  . heparin 2,050 Units/hr (10/22/18 0600)  . nitroGLYCERIN Stopped (10/16/18 1151)    Assessment: Pt is a 77yoM with new onset afib with RVR. Pharmacy is  consulted to start heparin drip. CBC relatively stable. Pt converted to NSR on amio gtt. Patient also on asa/brilinta planning to continue heparin for now, with transitioning to eliquis/plavix if further afib noted   Heparin level remains therapeutic at 0.51 units/ml on heparin IV infusion at 2050 units/hr.    Goal of Therapy:  Heparin level 0.3-0.7 units/ml Monitor platelets by anticoagulation protocol: Yes   Plan:  -Continue heparin infusion at 2050 units/hr -Daily heparin level/ CBC  Ewing Schlein, PharmD PGY1 Pharmacy Resident 10/22/2018    7:29 AM Please check AMION for all [facility] Pharmacy numbers

## 2018-10-22 NOTE — Progress Notes (Signed)
Inpatient Rehabilitation Admissions Coordinator  I will follow up tomorrow with pt's progress and plan admit to CIR pending bed availability.  Ottie Glazier, RN, MSN Rehab Admissions Coordinator [phone] 10/22/2018 1:20 PM

## 2018-10-23 ENCOUNTER — Inpatient Hospital Stay (HOSPITAL_COMMUNITY): Payer: Medicare Other

## 2018-10-23 DIAGNOSIS — R1013 Epigastric pain: Secondary | ICD-10-CM

## 2018-10-23 LAB — BASIC METABOLIC PANEL
Anion gap: 14 (ref 5–15)
BUN: 25 mg/dL — ABNORMAL HIGH (ref 8–23)
CO2: 22 mmol/L (ref 22–32)
Calcium: 8.3 mg/dL — ABNORMAL LOW (ref 8.9–10.3)
Chloride: 98 mmol/L (ref 98–111)
Creatinine, Ser: 1.31 mg/dL — ABNORMAL HIGH (ref 0.61–1.24)
GFR calc Af Amer: >60 mL/min (ref >60)
GFR calc non Af Amer: 53 mL/min — ABNORMAL LOW (ref >60)
Glucose, Bld: 160 mg/dL — ABNORMAL HIGH (ref 70–99)
Potassium: 3.7 mmol/L (ref 3.5–5.1)
Sodium: 134 mmol/L — ABNORMAL LOW (ref 135–145)

## 2018-10-23 LAB — HEPATIC FUNCTION PANEL
ALT: 31 U/L (ref 0–44)
AST: 28 U/L (ref 15–41)
Albumin: 2.9 g/dL — ABNORMAL LOW (ref 3.5–5.0)
Alkaline Phosphatase: 72 U/L (ref 38–126)
Bilirubin, Direct: 0.2 mg/dL (ref 0.0–0.2)
Indirect Bilirubin: 1 mg/dL — ABNORMAL HIGH (ref 0.3–0.9)
Total Bilirubin: 1.2 mg/dL (ref 0.3–1.2)
Total Protein: 6.8 g/dL (ref 6.5–8.1)

## 2018-10-23 LAB — CBC
HCT: 39.5 % (ref 39.0–52.0)
Hemoglobin: 13.1 g/dL (ref 13.0–17.0)
MCH: 30.7 pg (ref 26.0–34.0)
MCHC: 33.2 g/dL (ref 30.0–36.0)
MCV: 92.5 fL (ref 80.0–100.0)
Platelets: 341 10*3/uL (ref 150–400)
RBC: 4.27 MIL/uL (ref 4.22–5.81)
RDW: 13.3 % (ref 11.5–15.5)
WBC: 17.8 10*3/uL — ABNORMAL HIGH (ref 4.0–10.5)
nRBC: 0 % (ref 0.0–0.2)

## 2018-10-23 LAB — COOXEMETRY PANEL
Carboxyhemoglobin: 1.2 % (ref 0.5–1.5)
Methemoglobin: 1.3 % (ref 0.0–1.5)
O2 Saturation: 76.2 %
Total hemoglobin: 13.9 g/dL (ref 12.0–16.0)

## 2018-10-23 LAB — HEPARIN LEVEL (UNFRACTIONATED): Heparin Unfractionated: 0.32 IU/mL (ref 0.30–0.70)

## 2018-10-23 LAB — LIPASE, BLOOD: Lipase: 37 U/L (ref 11–51)

## 2018-10-23 LAB — TROPONIN I
Troponin I: 2.92 ng/mL (ref <0.03)
Troponin I: 1.9 ng/mL (ref <0.03)
Troponin I: 1.75 ng/mL (ref <0.03)

## 2018-10-23 MED ORDER — NITROGLYCERIN 0.4 MG SL SUBL
SUBLINGUAL_TABLET | SUBLINGUAL | Status: AC
  Administered 2018-10-23: 02:00:00
  Filled 2018-10-23: qty 1

## 2018-10-23 MED ORDER — ALUM & MAG HYDROXIDE-SIMETH 200-200-20 MG/5ML PO SUSP
30.0000 mL | ORAL | Status: DC | PRN
  Administered 2018-10-23: 30 mL via ORAL
  Filled 2018-10-23: qty 30

## 2018-10-23 MED ORDER — SORBITOL 70 % SOLN
30.0000 mL | Freq: Once | Status: AC
  Administered 2018-10-23: 30 mL via ORAL
  Filled 2018-10-23: qty 30

## 2018-10-23 MED ORDER — SIMETHICONE 80 MG PO CHEW
80.0000 mg | CHEWABLE_TABLET | Freq: Once | ORAL | Status: AC
  Administered 2018-10-23: 80 mg via ORAL
  Filled 2018-10-23: qty 1

## 2018-10-23 MED ORDER — FUROSEMIDE 80 MG PO TABS
80.0000 mg | ORAL_TABLET | Freq: Every day | ORAL | Status: DC
  Administered 2018-10-24 – 2018-10-27 (×4): 80 mg via ORAL
  Filled 2018-10-23 (×4): qty 1

## 2018-10-23 MED ORDER — FUROSEMIDE 10 MG/ML IJ SOLN
80.0000 mg | Freq: Once | INTRAMUSCULAR | Status: AC
  Administered 2018-10-23: 03:00:00 80 mg via INTRAVENOUS
  Filled 2018-10-23: qty 8

## 2018-10-23 NOTE — TOC Progression Note (Signed)
Transition of Care (TOC) - Progression Note    Patient Details  Name: Justin Bradford. MRN: [id number] Date of Birth: May 28, 1941  Transition of Care [facility]) CM/SW Contact  Graves-Bigelow, Lamar Laundry, RN Phone Number: 10/23/2018, 10:34 AM  Clinical Narrative:  CM did speak with CIR Admissions Coordinator and limited beds available. CM did reach out to CSW to make sure we have a SNF plan as well. CM will continue to monitor for additional transition of care needs.     Expected Discharge Plan: IP Rehab Facility Barriers to Discharge: Continued Medical Work up  Expected Discharge Plan and Services Expected Discharge Plan: IP Rehab Facility In-house Referral: NA Discharge Planning Services: CM Consult Post Acute Care Choice: IP Rehab Living arrangements for the past 2 months: Single Family Home                 DME Arranged: N/A DME Agency: NA       HH Arranged: NA HH Agency: NA         Social Determinants of Health (SDOH) Interventions    Readmission Risk Interventions No flowsheet data found.

## 2018-10-23 NOTE — Progress Notes (Signed)
Occupational Therapy Treatment Patient Details Name: Justin Bradford. MRN: [id number] DOB: June 26, 1941 Today's Date: 10/23/2018    History of present illness 78yo male who presented with PMH of COPD, HTN, HLD, BPH, T2DM who presented with chest pain and shortness of breath with positive NSTEMI and was treated for LAD occlusion with DES stent, HF exacerbation and CAP without improvement in dyspnea or hypoxia.   OT comments  Pt able to walk in room with min assist and RW, stand at sink for one grooming activity and remained up in chair at end of session. Pt continues to demonstrate decreased activity tolerance, but Sp02 remained > 92% on RA. Will continue to follow.  Follow Up Recommendations  CIR    Equipment Recommendations  Other (comment)(defer to next venue)    Recommendations for Other Services      Precautions / Restrictions Precautions Precautions: Fall Restrictions Weight Bearing Restrictions: No       Mobility Bed Mobility Overal bed mobility: Needs Assistance Bed Mobility: Supine to Sit     Supine to sit: Min assist     General bed mobility comments: pulled up on therapist's hand, use of rail, HOB up 45 degrees  Transfers Overall transfer level: Needs assistance Equipment used: Rolling walker (2 wheeled) Transfers: Sit to/from Stand Sit to Stand: Min guard         General transfer comment: cues for hand placement, increased time and effort    Balance Overall balance assessment: Needs assistance   Sitting balance-[name]: Fair       Standing balance-[name]: Poor Standing balance comment: stabilizes with one hand on sink during oral care                           ADL either performed or assessed with clinical judgement   ADL Overall ADL's : Needs assistance/impaired     Grooming: Min guard;Standing;Oral care           Upper Body Dressing : Minimal assistance;Sitting                   Functional mobility  during ADLs: Minimal assistance;Rolling walker General ADL Comments: pt appears SOB with talking, but Sp02 remained in mid 90s     Vision       Perception     Praxis      Cognition Arousal/Alertness: Awake/alert Behavior During Therapy: WFL for tasks assessed/performed Overall Cognitive Status: Within Functional Limits for tasks assessed                                          Exercises     Shoulder Instructions       General Comments      Pertinent Vitals/ Pain       Pain Assessment: No/denies pain  Home Living                                          Prior Functioning/Environment              Frequency  Min 2X/week        Progress Toward Goals  OT Goals(current goals can now be found in the care plan section)  Progress towards OT goals: Progressing toward goals  Acute  Rehab OT Goals Patient Stated Goal: to go home OT Goal Formulation: With patient Time For Goal Achievement: 11/04/18 Potential to Achieve Goals: Good  Plan Discharge plan remains appropriate    Co-evaluation                 AM-PAC OT "6 Clicks" Daily Activity     Outcome Measure   Help from another person eating meals?: None Help from another person taking care of personal grooming?: A Little Help from another person toileting, which includes using toliet, bedpan, or urinal?: A Lot Help from another person bathing (including washing, rinsing, drying)?: A Lot Help from another person to put on and taking off regular upper body clothing?: A Little Help from another person to put on and taking off regular lower body clothing?: A Lot 6 Click Score: 16    End of Session Equipment Utilized During Treatment: Gait belt;Rolling walker  OT Visit Diagnosis: Other abnormalities of gait and mobility (R26.89);Muscle weakness (generalized) (M62.81)   Activity Tolerance Patient tolerated treatment well   Patient Left in chair;with call bell/phone  within reach;with chair alarm set   Nurse Communication          Time: [phone] OT Time Calculation (min): 40 min  Charges: OT General Charges $OT Visit: 1 Visit OT Treatments $Self Care/Home Management : 38-52 mins  Martie Round, OTR/L Acute Rehabilitation Services Pager: [phone] Office: [phone]   Evern Bio 10/23/2018, 4:53 PM

## 2018-10-23 NOTE — Progress Notes (Signed)
Order received to DC IV heparin.  Per last pharmacy note plan was to continue IV heparin.  Order clarified with Clydie Braun PharmD.  IV heparin stopped.

## 2018-10-23 NOTE — Progress Notes (Signed)
Subjective Paged by RN that Justin Bradford was experiencing abdominal pain after a loose bowel movement. He was given prn maalox and morphine without improvement. I evaluated the patient at bedside. He reports that he developed 10/10 acute abdominal pain that has moved from his lower abdomen to epigastric region and left chest. He describes the pain as gas pain. He has never experienced pain like this before. It does not feel like his recent MI, which he experienced as a sore throat. The patient reports nausea and one episode of vomiting. He endorses worsened dyspnea and his supplemental oxygen was increased from 2 to 6L by [place] per RN. He denies diaphoresis. I evaluated the patient at bedside.   Objective VS: Afebrile, HR 70s, BP 120s/80s, O2 sat high 90s on 6L [place] Gen: Alert and oriented x3. Appears distressed. Pulm: Bibasilar crackles CV: RRR, no murmurs Abd: Soft. Mildly distended. Epigastric ttp. Ext: Trace pitting edema BLE  EKG: no new ischemic changes KUB: mild diffuse gaseous distension of the bowel without obstruction or free air CXR: Improving bilateral airspace opacities since prior study  Lipase wnl Troponin 2.92 (was >65 seven days ago)  Assessment Epigastric Pain: Most likely gastrointestinal in nature. His pain was not relieved by a GI cocktail, but he felt somewhat better after having another liquid bowel movement and receiving simethicone. No obstruction on KUB. Need to rule out ACS given patient's recent history of MI earlier this admission. He was given sublingual nitro, which did not improve his pain. EKG reassuring. Troponin elevated, but markedly improved over the past 7 days, will continue to trend. - Trend troponin - Morphine prn  Hypoxia: Increased supplemental oxygen from 2L to 6L. Hypoxia may be due to distress from the pain and abdominal distension, however he had bibasilar crackles, trace BLE edema, and 2lb weight gain since the day prior. CXR is improving.  - Lasix  80mg  IV once

## 2018-10-23 NOTE — Progress Notes (Signed)
Cardiac Rehab Advisory Cardiac Rehab Phase I is not seeing pts face to face at this time due to Covid 19 restrictions. Ambulation is occurring through nursing, PT, and mobility teams. We will help facilitate that process as needed. We are calling pts in their rooms and discussing education. We will then deliver education materials to pts RN for delivery to pt.   Spoke with pt by phone and reviewed education. Discussed Brilinta, daily wts, low sodium diet, increasing exercise tolerance, NTG, and CRPII. Good reception, he remembered most of material from last week. He still has his books and handouts and will review them if needed. [id number] Justin Bradford CES, ACSM 2:50 PM 10/23/2018

## 2018-10-23 NOTE — Progress Notes (Signed)
ANTICOAGULATION CONSULT NOTE - Follow-Up Consult  Pharmacy Consult for Heparin Indication: atrial fibrillation  No Known Allergies  Patient Measurements: Height: 6' (182.9 cm) Weight: 280 lb 3.3 oz (127.1 kg) IBW/kg (Calculated) : 77.6 HEPARIN DW (KG): 107.9   Vital Signs: Temp: 97.6 F (36.4 C) (04/23 0449) Temp Source: Oral (04/23 0449) BP: 106/67 (04/23 0645) Pulse Rate: 76 (04/23 0645)  Labs: Recent Labs    10/21/18 0417 10/22/18 0500 10/23/18 0153 10/23/18 0538 10/23/18 0730  HGB 12.9* 13.0 13.1  --   --   HCT 39.2 39.5 39.5  --   --   PLT 327 330 341  --   --   HEPARINUNFRC 0.48 0.51  --  0.32  --   CREATININE 1.37* 1.22 1.31*  --   --   TROPONINI  --   --  2.92*  --  1.90*    Estimated Creatinine Clearance: 66.1 mL/min (A) (by C-G formula based on SCr of 1.31 mg/dL (H)).   Medical History: Past Medical History:  Diagnosis Date  . Hypertension   . Peripheral neuropathy     Medications:  Scheduled:  . alum & mag hydroxide-simeth  30 mL Oral Once   And  . lidocaine  15 mL Oral Once  . amiodarone  200 mg Oral BID  . arformoterol  15 mcg Nebulization BID  . aspirin  81 mg Oral Daily  . atorvastatin  80 mg Oral q1800  . budesonide (PULMICORT) nebulizer solution  0.25 mg Nebulization BID  . carvedilol  3.125 mg Oral BID WC  . Chlorhexidine Gluconate Cloth  6 each Topical Daily  . clonazePAM  1 mg Oral QHS  . FLUoxetine  60 mg Oral Daily  . lamoTRIgine  75 mg Oral QHS  . losartan  12.5 mg Oral Daily  . pantoprazole  40 mg Oral Daily  . sodium chloride flush  10-40 mL Intracatheter Q12H  . spironolactone  12.5 mg Oral Daily  . tamsulosin  0.4 mg Oral QHS  . ticagrelor  90 mg Oral BID   Infusions:  . sodium chloride Stopped (10/22/18 0913)  . heparin 2,050 Units/hr (10/22/18 1713)  . nitroGLYCERIN Stopped (10/16/18 1151)    Assessment: Pt is a 77yoM with new onset afib with RVR. Pharmacy is consulted to start heparin drip. CBC relatively  stable. Pt converted to NSR on amio gtt. Patient also on asa/brilinta s/p DES to LAD this admit.  Planning to continue heparin for now, with transitioning to eliquis/plavix if further afib noted or ticag + asa if maintains SR.    Heparin level remains therapeutic at 0.3 units/ml on heparin IV infusion at 2050 units/hr.  No bleeding noted, CBC stable   Goal of Therapy:  Heparin level 0.3-0.7 units/ml Monitor platelets by anticoagulation protocol: Yes   Plan:  -Continue heparin infusion at 2050 units/hr -Daily heparin level/ CBC  Leota Sauers Pharm.D. CPP, BCPS Clinical Pharmacist [phone] 10/23/2018 9:33 AM

## 2018-10-23 NOTE — Care Management Important Message (Signed)
Important Message  Patient Details  Name: Justin Bradford. MRN: [id number] Date of Birth: April 28, 1941   Medicare Important Message Given:  Yes    Souleymane Saiki Stefan Church 10/23/2018, 12:47 PM

## 2018-10-23 NOTE — Progress Notes (Signed)
PT Cancellation Note  Patient Details Name: Justin Bradford. MRN: [id number] DOB: Jan 23, 1941   Cancelled Treatment:    Reason Eval/Treat Not Completed: (P) Patient declined, no reason specified Pt had just had first BM in multiple days and reports being too fatigued to participate in therapy. PT will follow back tomorrow.    Elon Alas Fleet 10/23/2018, 4:55 PM

## 2018-10-23 NOTE — Progress Notes (Signed)
Patient ID: Justin PoseyBruce D Vasek Jr., male   DOB: 03/08/1941, 78 y.o.   MRN: [id number]    Advanced Heart Failure Rounding Note   Subjective:    Maintaining NSR on PO amiodarone. Started on losartan yesterday. SBP 100-110s. Co-ox 76%. CVP 4. Received 80 mg IV lasix this morning.  Oxygen saturation mid 90s on 3L [place] currently. CXR today shows diffuse bilateral airspace opacities R>>L but slightly improved  Troponin trended overnight due to abdominal pain/CP/SOB/nausea after a BM. Trop 2.92 -> 1.90 (>65 on 4/16). EKG similar to previous. KUB negative. LFTs normal. He was given 80 mg IV lasix with 2 L UOP. Weight down 8 lbs (14 pounds total). CXR still with diffuse bialateral infiltrates R>L but improving slowly.    Belly still a bit sore. Denies CP or SOB. Eager to walk   Planning for CIR at DC.   Objective:   Weight Range:  Vital Signs:   Temp:  [97.6 F (36.4 C)-97.8 F (36.6 C)] 97.6 F (36.4 C) (04/23 0449) Pulse Rate:  [75-83] 76 (04/23 0645) Resp:  [14-23] 16 (04/23 0645) BP: (100-142)/(63-120) 106/67 (04/23 0645) SpO2:  [91 %-97 %] 97 % (04/23 0756) Weight:  [127.1 kg] 127.1 kg (04/23 0449) Last BM Date: 10/22/18  Weight change: Filed Weights   10/21/18 0500 10/22/18 0500 10/23/18 0449  Weight: 128.7 kg 130.9 kg 127.1 kg    Intake/Output:   Intake/Output Summary (Last 24 hours) at 10/23/2018 0915 Last data filed at 10/23/2018 0700 Gross per 24 hour  Intake 1178.92 ml  Output 2650 ml  Net -1471.08 ml     Physical Exam: General: Elderly male lying flat in bed No resp difficulty. HEENT: Normal Neck: Supple. JVP flat. Carotids 2+ bilat; no bruits. No thyromegaly or nodule noted. Cor: PMI nondisplaced. RRR, No M/G/R noted Lungs: bilateral fine crackles  Abdomen: Obese Soft, minimally tender, non-distended, no HSM. No bruits or masses. +BS  Extremities: No cyanosis, clubbing, or rash. R and LLE no edema.  Neuro: Alert & orientedx3, cranial nerves grossly intact. moves  all 4 extremities w/o difficulty. Affect pleasant   Telemetry: NSR 70s Personally reviewed  EKG: NSR 77 bpm with 1st degree AV block and RBBB (QRS 148 ms). Looks similar to EKG on 4/17. Personally reviewed.    Labs: Basic Metabolic Panel: Recent Labs  Lab 10/19/18 0321 10/20/18 0325 10/21/18 0417 10/22/18 0500 10/23/18 0153  NA 133* 135 133* 134* 134*  K 3.7 3.1* 3.5 3.9 3.7  CL 98 104 94* 97* 98  CO2 25 21* 25 25 22   GLUCOSE 132* 237* 165* 121* 160*  BUN 29* 25* 30* 27* 25*  CREATININE 1.44* 1.13 1.37* 1.22 1.31*  CALCIUM 8.3* 7.0* 8.3* 8.5* 8.3*  MG 2.3  --  2.0  --   --     Liver Function Tests: Recent Labs  Lab 10/23/18 0730  AST 28  ALT 31  ALKPHOS 72  BILITOT 1.2  PROT 6.8  ALBUMIN 2.9*   Recent Labs  Lab 10/23/18 0153  LIPASE 37   No results for input(s): AMMONIA in the last 168 hours.  CBC: Recent Labs  Lab 10/19/18 0321 10/20/18 0325 10/21/18 0417 10/22/18 0500 10/23/18 0153  WBC 15.6* 22.4* 17.0* 15.6* 17.8*  HGB 12.1* 12.1* 12.9* 13.0 13.1  HCT 38.6* 37.0* 39.2 39.5 39.5  MCV 93.9 92.7 92.0 92.3 92.5  PLT 267 356 327 330 341    Cardiac Enzymes: Recent Labs  Lab 10/16/18 1608 10/23/18 0153 10/23/18 0730  TROPONINI >  65.00* 2.92* 1.90*    BNP: BNP (last 3 results) No results for input(s): BNP in the last 8760 hours.  ProBNP (last 3 results) No results for input(s): PROBNP in the last 8760 hours.    Other results:  Imaging: Dg Abd 1 View  Result Date: 10/23/2018 CLINICAL DATA:  Abdominal pain, hypoxia EXAM: ABDOMEN - 1 VIEW COMPARISON:  None. FINDINGS: Mild diffuse gaseous distention of bowel. No evidence of obstruction, organomegaly or free air. No suspicious calcification. Bibasilar atelectasis. IMPRESSION: Mild diffuse gaseous distention of bowel. No evidence for obstruction or free air. Electronically Signed   By: Charlett Nose M.D.   On: 10/23/2018 02:20   Dg Chest Port 1 View  Result Date: 10/23/2018 CLINICAL DATA:   Abdominal pain, hypoxia EXAM: PORTABLE CHEST 1 VIEW COMPARISON:  10/20/2018 FINDINGS: Right PICC line remains in place, unchanged. Cardiomegaly. Right greater than left airspace opacities are again noted. Some improvement on the left since prior study. There is also been improvement in the right lower lung lobe since prior study. Stable consolidation in the right upper lobe. No visible effusions or acute bony abnormality. IMPRESSION: Bilateral airspace opacities, right greater than left, improving since prior study. Cardiomegaly. Electronically Signed   By: Charlett Nose M.D.   On: 10/23/2018 02:21     Medications:     Scheduled Medications: . alum & mag hydroxide-simeth  30 mL Oral Once   And  . lidocaine  15 mL Oral Once  . amiodarone  200 mg Oral BID  . arformoterol  15 mcg Nebulization BID  . aspirin  81 mg Oral Daily  . atorvastatin  80 mg Oral q1800  . budesonide (PULMICORT) nebulizer solution  0.25 mg Nebulization BID  . carvedilol  3.125 mg Oral BID WC  . Chlorhexidine Gluconate Cloth  6 each Topical Daily  . clonazePAM  1 mg Oral QHS  . FLUoxetine  60 mg Oral Daily  . lamoTRIgine  75 mg Oral QHS  . losartan  12.5 mg Oral Daily  . pantoprazole  40 mg Oral Daily  . sodium chloride flush  10-40 mL Intracatheter Q12H  . spironolactone  12.5 mg Oral Daily  . tamsulosin  0.4 mg Oral QHS  . ticagrelor  90 mg Oral BID    Infusions: . sodium chloride Stopped (10/22/18 0913)  . heparin 2,050 Units/hr (10/22/18 1713)  . nitroGLYCERIN Stopped (10/16/18 1151)    PRN Medications: sodium chloride, acetaminophen, albuterol, alum & mag hydroxide-simeth, morphine injection, ondansetron (ZOFRAN) IV, sodium chloride flush    Coronary Diagrams   Diagnostic  Dominance: Right    Intervention     Assessment:   Justin Bradford. is a 78 y.o. male with a hx of HTN, HLD, BPH and obesitywho was admitted with NSTEMI and found to have totally occluded LAD s/p DES on 4/16. Post MI care  complicated by acute systolic HF with EF 30%   Plan/Discussion:     1.  CAD/NSTEMI: Peak trop > 65. Cath 4/16 with total occlusion of LAD -> s/p DES (55% residual stenosis due to heavy calcification).  Residual CAD with LCx 60% and RCA 20%. EF 30% with LVEDP 27.  - Doubt pain last night was cardiac in nature. ECG and trop are ok.  - Continue DAPT and b-blocker  - Continue atorvastatin 80 daily.   - Troponin improving 2.9 to 1.90 (>65 on 4/16).  2. Acute systolic HF: Due to LAD infarct. Echo 10/16/18 EF 30-35%. CVP 4 this morning with  co-ox 76%, now off norepinephrine. Creatinine stable.    - Received 80 mg IV lasix this morning.CVP now 3-4. Volume ok. Will start lasix 80 po daily starting tomorrow  - Continue spironolactone 12.5 and Coreg 3.125.  - Continue losartan 12.5 mg daily.  Eventually transition to [facility] if BP tolerates.  3. Acute hypoxic respiratory failure: Has COPD but not on oxygen at home.  CXR with asymmetric diffuse right-sided airspace disease on 4/20.  Had some hemoptysis but this has resolved.  WBCs lower at 15.  Afebrile. ?Aspiration, ?viral pneumonitis.  COVID-19 negative x 2, viral panel negative, blood cultures NGTD. Oxygen saturation now better with diuresis but still requiring 2L [place] after full diuresis.  - Completed course of ABX for CAP.  - still with diffuse infiltrates on CXR R>L slightly improved but do not look like HF to me and time course of worsening and clearing do not correlate well with pulmonary edema - I will repeat non-contrast CT today to further evaluate for acute pneumonitis. IMTS stopped steroids after one dose recently 4.  COPD:  Recently evaluated by pulmonary medicine and is now treated for COPD with Breo and Ventolin. Not on home oxygen prior to admission.  - Management per IM/pulmonary. No change.  5. AKI: ? Shock vs CIN vs diuresis. Resolved.  6. Atrial fibrillation: First noted on 10/19/18, now back in NSR today on amiodarone.  He is also now on  heparin gtt (+ ASA 81 and ticagrelor).  Given only brief atrial fibrillation and need for DAPT, would not commit him to triple therapy at this.  Would continue heparin gtt for now, if no recurrent atrial fibrillation would not send home on anticoagulation.  See below.  - Continue amiodarone (1 month if no further atrial fibrillation). No change.  - No further AF. Given very transient episode in setting of respiratory distress will stop heparin and use ticagrelor + ASA if no further afib.  7. Abdominal pain - KUB negative. LFTs normal. Per primary team.  - Will give one dose sorbitol   Planning to transfer to CIR when ready.   Length of Stay: 8  Arvilla Meres, MD  11:07 AM   Advanced Heart Failure Team Pager [phone] (M-F; 7a - 4p)  Please contact [facility] for night-coverage after hours (4p -7a ) and weekends on [email or web address]

## 2018-10-23 NOTE — Progress Notes (Addendum)
Subjective: HD#8   Overnight: Mr. Adela Glimpse reported of experiencing a 10/10 acute epigastric pain and left chest pain with associated nausea and one episode of nonbloody nonbilious emesis after bowel movement.  He also reported of dyspnea for which his supplemental oxygen was increased from 2 L to 6 L nasal cannula per the nursing staff.  He was evaluated by the night team.  He was administrated GI cocktail with minimal relief, abdominal x-ray showed no obstruction, EKG was reassuring, troponin was mildly elevated.  Interventions for his symptoms included sublingual nitrogen, morphine and IV Lasix 80 mg once.   Today, he reports of improvement to his abdominal pain.  This morning he rates the pain at 2 out of 10 which was present after waking up this morning.  Describes the pain as soreness which is somewhat diffused.  He endorsed fluctuance and fecal incontinence yesterday as well after having a late dinner.  He denies hematochezia presently but does endorse decreased appetite.  He also reports of abdominal distention which is somewhat improved from yesterday.  Currently he also denies pleuritic chest pain, nausea, vomiting or palpitation.  He was not able to ambulate yesterday as he would have liked.  Objective:  Vital signs in last 24 hours: Vitals:   10/22/18 2015 10/22/18 2018 10/22/18 2035 10/23/18 0449  BP:   109/63 110/72  Pulse:   76 78  Resp:   (!) 23 20  Temp:   97.8 F (36.6 C) 97.6 F (36.4 C)  TempSrc:   Oral Oral  SpO2: 91% 92% 96% 97%  Weight:    127.1 kg  Height:       Const: In no apparent distress, speaks in full sentences, lying in bed. HEENT: Atraumatic, normocephalic Resp: Minimal crackles appreciated by laterally at the posterior lower lobes CV: RRR, no murmurs, gallops, rubs.  Review of telemetry monitor only shows PVCs. Abd: Somewhat distended, bowel sounds present, minimally tender to touch worse on the right side of the abdomen Ext: Trace edema Skin: No sign  of Livedo reticularis   Assessment/Plan:  Principal Problem:   Acute anterior myocardial infarction [facility]) Active Problems:   Chest pain   COPD exacerbation (HCC)   Atypical pneumonia   Elevated troponin   Acute systolic heart failure (HCC)   Status post coronary artery stent placement   Myocardial infarction involving left anterior descending (LAD) coronary artery (HCC)   Supplemental oxygen dependent   Essential hypertension   Steroid-induced hyperglycemia   Diabetes mellitus type 2 in obese (HCC)   Tobacco abuse   Leukocytosis   Hyponatremia   Acute hypoxemic respiratory failure (HCC)  Mr.Mertz is a 78 yo M w/ PMH of HTN, HLD, T2DM, BPH, obesitywhopresentedwith acute onsetchest painfound to have Acute anterior MI with 100% occlusion ofproximalLED s/p DES. Also here for management of Acute HFrEF, Hypoxic respiratory failure, CAP and A-Fibb.  #Acute anterior MI:He had an episode of epigastric/diffuse abdominal pain yesterday for which there was concern for in-stent stenosis which could have caused secondary myocardial infarction.  Troponin level has thus been unremarkable compared to levels prior.  Currently 1.9<<2.9.  Also denies pleuritic chest pain, nausea, vomiting, palpitation. -Continue aspirin 81 mg daily, Brilinta -Continue Lipitor 80 mg daily -ContinueCoreg -Appreciate cardiology recommendation  #AcuteHFrEF: Only objective finding of fluid overload is minimal bibasilar crackles.  He is status post one-time dose of IV Lasix 80 mg overnight.  Total urine output in the past 24 hours is 1.5 L (net -500 cc).  He did  require increased oxygen demand from 2 L to 6 L overnight due to dyspnea during an episode of severe abdominal pain.  However this morning her oxygen saturation is 97% on 3 L nasal cannula.  His weight this morning is 127 kg from 130 kg yesterday. -HOLD PO Lasix 40 mg for today as he received IV Lasix 80mg  overnight -Strict intake and output  -ContinueCoreg, spironolactone and losartan  -Continue cardiac monitoring  #Acute onset abdominal pain: Overnight, he had an acute onset epigastric 10/10 abdominal pain which was relieved with morphine.  This pain was associated with nausea and vomiting but denied hematochezia.  He did report of having a late dinner and subsequently experiencing the abdominal pain.  The pain was preceded after having a large bowel movement.  KUB did not show an obstructive pattern.  Other possibilities include possible mesenteric ischemia from post PCI or embolic source though this is very less likely.  Bowel sounds are present though abdomen looks somewhat distended. -Encourage ambulation -Continue to monitor -If pain reoccurs, will consider CT angiography of abdomen.  #Persistent leukocytosis: Currently no sign of infection that has been noted from his vital signs.  He does not endorse pleuritic chest pain or tenderness on palpation of the chest again Dressler syndrome or pericarditis less likely.   -Continue to monitor  #New onset A. fib with RVR-resolved: Continue to remain in SR.  -Continue oral amiodarone  -Continue DAPT + heparin for now -If he converts back to A-fibb, will transition to Eliquis + plavix  #Acute hypoxic respiratory failure:Currently oxygen saturation is 97% on 3 L nasal cannula. -s/p completion of antibiotics for CAP -Continue nebulizer treatment -Supplemental O2 prn   #Acute kidney injury: sCr 1.3<<1.2 -Continue to monitor while diuresing   #Parkinsonian features: -Continue lamotrigine and fluoxetine  #COPD -Continue nebulizers withpulmicort, brovana  #Hypertension -BP stable  -ContinueCoreg, spironolactone and losartan   #Hypokalemia:Resolved  #Generalized anxiety disorder:  -Clonazepam 1 mg qhs  TWK:MQKMMNO electrolytes as needed, heart healthy diet VTE TRR:NHAFBXU CODE STATUS:Full code  Dispo: Anticipatedtransferto [employer]  authorization  Yvette Rack, MD 10/23/2018, 6:23 AM Pager: [phone] IMTS PGY-1

## 2018-10-24 DIAGNOSIS — I712 Thoracic aortic aneurysm, without rupture: Secondary | ICD-10-CM

## 2018-10-24 LAB — CBC
HCT: 39.4 % (ref 39.0–52.0)
Hemoglobin: 12.7 g/dL — ABNORMAL LOW (ref 13.0–17.0)
MCH: 29.9 pg (ref 26.0–34.0)
MCHC: 32.2 g/dL (ref 30.0–36.0)
MCV: 92.7 fL (ref 80.0–100.0)
Platelets: 363 10*3/uL (ref 150–400)
RBC: 4.25 MIL/uL (ref 4.22–5.81)
RDW: 13.4 % (ref 11.5–15.5)
WBC: 18.7 10*3/uL — ABNORMAL HIGH (ref 4.0–10.5)
nRBC: 0 % (ref 0.0–0.2)

## 2018-10-24 LAB — COOXEMETRY PANEL
Carboxyhemoglobin: 1.6 % — ABNORMAL HIGH (ref 0.5–1.5)
Methemoglobin: 1.4 % (ref 0.0–1.5)
O2 Saturation: 65.6 %
Total hemoglobin: 13.3 g/dL (ref 12.0–16.0)

## 2018-10-24 LAB — BASIC METABOLIC PANEL
Anion gap: 13 (ref 5–15)
BUN: 24 mg/dL — ABNORMAL HIGH (ref 8–23)
CO2: 25 mmol/L (ref 22–32)
Calcium: 8.8 mg/dL — ABNORMAL LOW (ref 8.9–10.3)
Chloride: 96 mmol/L — ABNORMAL LOW (ref 98–111)
Creatinine, Ser: 1.33 mg/dL — ABNORMAL HIGH (ref 0.61–1.24)
GFR calc Af Amer: 60 mL/min — ABNORMAL LOW (ref >60)
GFR calc non Af Amer: 52 mL/min — ABNORMAL LOW (ref >60)
Glucose, Bld: 125 mg/dL — ABNORMAL HIGH (ref 70–99)
Potassium: 3.6 mmol/L (ref 3.5–5.1)
Sodium: 134 mmol/L — ABNORMAL LOW (ref 135–145)

## 2018-10-24 LAB — PROCALCITONIN: Procalcitonin: 0.17 ng/mL

## 2018-10-24 MED ORDER — POTASSIUM CHLORIDE CRYS ER 20 MEQ PO TBCR
40.0000 meq | EXTENDED_RELEASE_TABLET | Freq: Once | ORAL | Status: AC
  Administered 2018-10-24: 40 meq via ORAL
  Filled 2018-10-24: qty 2

## 2018-10-24 MED ORDER — POTASSIUM CHLORIDE CRYS ER 20 MEQ PO TBCR
40.0000 meq | EXTENDED_RELEASE_TABLET | Freq: Every day | ORAL | Status: DC
  Administered 2018-10-25 – 2018-10-27 (×3): 40 meq via ORAL
  Filled 2018-10-24 (×3): qty 2

## 2018-10-24 MED ORDER — ENOXAPARIN SODIUM 40 MG/0.4ML ~~LOC~~ SOLN
40.0000 mg | SUBCUTANEOUS | Status: DC
  Administered 2018-10-24 – 2018-10-27 (×4): 40 mg via SUBCUTANEOUS
  Filled 2018-10-24 (×4): qty 0.4

## 2018-10-24 NOTE — PMR Pre-admission (Signed)
PMR Admission Coordinator Pre-Admission Assessment  Patient: Justin Bradford. is an 78 y.o., male MRN: [id number] DOB: [birth year] Height: 6' (182.9 cm) Weight: 125.9 kg              Insurance Information HMO:     PPO:      PCP:      IPA:      80/20:      OTHER: no HMO PRIMARY: Medicare a and b      Policy#: [id number]      Subscriber: pt Benefits:  Phone #: passport one online     Name: 10/21/2018 Eff. Date: 06/02/2007     Deduct: $1408      Out of Pocket Max: none      Life Max: none CIR: 100%      SNF: 20 full days Outpatient: 80%     Co-Pay: 20% Home Health: 100%      Co-Pay: none DME: 80%     Co-Pay: 20% Providers: pt choice  SECONDARY: none/[employer] policy inactive       Medicaid Application Date:       Case Manager:  Disability Application Date:       Case Worker:   The "Data Collection Information Summary" for patients in Inpatient Rehabilitation Facilities with attached "Privacy Act Statement-Health Care Records" was provided and verbally reviewed with: Patient  Emergency Contact Information Contact Information    Name Relation Home Work [place], [place] Sister   [phone]   Shawnie Dapper [phone]  [phone]     Current Medical History  Patient Admitting Diagnosis: Debility  History of Present Illness: Justin Bradford is a 78 year old right handed male with history of hyperlipidemia,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 Presented 10/15/2018 with chest pain and shortness of breath. Workup revealed troponin 0.05 trending up to 12.54, lactic acid 1.7, potassium 3.4 and WBC 16,600-24,600. Chest x-ray COPD without acute airspace disease.viral panel negative. Blood cultures no growth to date. CT angiogram of the chest no evidence of pulmonary  emboli. EKG showed mild ST elevation in the anterior leads suspect non-STEMI. Echocardiogram with ejection fraction 35% moderate to severe reduced systolic function. Cardiac catheterization completed 10/16/2018 by Dr. Excell Seltzer with total occlusion LAD and underwent stent placement. Required norepinephrine for blood pressure control as well as IV Lasix for diuresis for acute systolic congestive heart failure. Bouts of atrial fibrillation requiring amiodarone.Maintained on IV heparin transitioned to Brilinta as well as low-dose aspirin with subcutaneous Lovenox. Hospital course compromised by hypoxic respiratory failure with right greater than left pulmonary infiltrates suspect CAP coverage 5 day total antibiotics. Patient still in need of oxygen therapy needing as much as 6 L oxygen to keep saturations greater than 90% with ambulation as well as nebulizer treatments. Tolerating a regular diet.    Past Medical History  Past Medical History:  Diagnosis Date  . Hypertension   . Peripheral neuropathy     Family History  family history is not on file.  Prior Rehab/Hospitalizations:  Has the patient had prior rehab or hospitalizations prior to admission? Yes  Has the patient had major surgery during 100 days prior to admission? No  Current Medications   Current Facility-Administered Medications:  .  acetaminophen (TYLENOL) tablet 650 mg, 650 mg, Oral, Q4H PRN, Nicki Guadalajara  A, MD, 650 mg at 10/18/18 0231 .  albuterol (PROVENTIL) (2.5 MG/3ML) 0.083% nebulizer solution 2.5 mg, 2.5 mg, Nebulization, Q4H PRN, [place], Carley D, DO, 2.5 mg at 10/22/18 0750 .  alum & mag hydroxide-simeth (MAALOX/MYLANTA) 200-200-20 MG/5ML suspension 30 mL, 30 mL, Oral, Once **AND** lidocaine (XYLOCAINE) 2 % viscous mouth solution 15 mL, 15 mL, Oral, Once, Theotis Barrio, MD .  alum & mag hydroxide-simeth (MAALOX/MYLANTA) 200-200-20 MG/5ML suspension 30 mL, 30 mL, Oral, Q4H PRN, Burns Spain, MD, 30 mL at 10/23/18  0032 .  amiodarone (PACERONE) tablet 200 mg, 200 mg, Oral, Daily, Laurey Morale, MD, 200 mg at 10/27/18 0930 .  aspirin chewable tablet 81 mg, 81 mg, Oral, Daily, Lennette Bihari, MD, 81 mg at 10/27/18 0930 .  atorvastatin (LIPITOR) tablet 80 mg, 80 mg, Oral, q1800, Lennette Bihari, MD, 80 mg at 10/26/18 1714 .  carvedilol (COREG) tablet 3.125 mg, 3.125 mg, Oral, BID WC, Laurey Morale, MD, 3.125 mg at 10/27/18 0930 .  clonazePAM (KLONOPIN) tablet 1 mg, 1 mg, Oral, QHS, Agyei, Obed K, MD, 1 mg at 10/26/18 2122 .  enoxaparin (LOVENOX) injection 40 mg, 40 mg, Subcutaneous, Q24H, Bensimhon, Bevelyn Buckles, MD, 40 mg at 10/26/18 2633 .  FLUoxetine (PROZAC) capsule 60 mg, 60 mg, Oral, Daily, Theotis Barrio, MD, 60 mg at 10/27/18 3545 .  fluticasone furoate-vilanterol (BREO ELLIPTA) 200-25 MCG/INH 1 puff, 1 puff, Inhalation, Daily, Agyei, Obed K, MD, 1 puff at 10/27/18 0728 .  furosemide (LASIX) tablet 80 mg, 80 mg, Oral, Daily, Bensimhon, Bevelyn Buckles, MD, 80 mg at 10/27/18 0930 .  hydrocortisone cream 1 % 1 application, 1 application, Topical, TID PRN, Burns Spain, MD .  lamoTRIgine (LAMICTAL) tablet 75 mg, 75 mg, Oral, QHS, Theotis Barrio, MD, 75 mg at 10/26/18 2122 .  losartan (COZAAR) tablet 12.5 mg, 12.5 mg, Oral, Daily, Laurey Morale, MD, 12.5 mg at 10/27/18 6256 .  morphine 2 MG/ML injection 2-4 mg, 2-4 mg, Intravenous, Q1H PRN, Eulah Pont, MD, 4 mg at 10/23/18 0209 .  ondansetron (ZOFRAN) injection 4 mg, 4 mg, Intravenous, Q6H PRN, Lennette Bihari, MD, 4 mg at 10/16/18 1953 .  pantoprazole (PROTONIX) EC tablet 40 mg, 40 mg, Oral, Daily, Theotis Barrio, MD, 40 mg at 10/27/18 0930 .  potassium chloride SA (K-DUR) CR tablet 40 mEq, 40 mEq, Oral, Daily, Bensimhon, Bevelyn Buckles, MD, 40 mEq at 10/27/18 0930 .  sodium chloride flush (NS) 0.9 % injection 10-40 mL, 10-40 mL, Intracatheter, Q12H, Burns Spain, MD, 10 mL at 10/26/18 2123 .  sodium chloride flush (NS) 0.9 % injection 10-40 mL, 10-40  mL, Intracatheter, PRN, Burns Spain, MD .  spironolactone (ALDACTONE) tablet 12.5 mg, 12.5 mg, Oral, Daily, Bensimhon, Bevelyn Buckles, MD, 12.5 mg at 10/27/18 0930 .  tamsulosin (FLOMAX) capsule 0.4 mg, 0.4 mg, Oral, QHS, Theotis Barrio, MD, 0.4 mg at 10/26/18 2123 .  ticagrelor (BRILINTA) tablet 90 mg, 90 mg, Oral, BID, Lennette Bihari, MD, 90 mg at 10/27/18 3893  Patients Current Diet:  Diet Order            Diet - low sodium heart healthy        Diet Heart Room service appropriate? 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    Self Care: Did the patient need help bathing, dressing, using the toilet or eating?  Independent  Indoor Mobility: Did the patient need assistance with walking from room to room (with or without device)? Independent  Stairs: Did the patient need assistance with internal or external stairs (with or without device)? Independent  Functional Cognition: Did the patient need help planning regular tasks such as shopping or remembering to take medications? Independent  Home Assistive Devices / Equipment Home Assistive Devices/Equipment: None Home Equipment: Toilet riser, Grab bars - tub/shower, Hand held shower head  Prior Device Use: Indicate devices/aids used by the patient prior to current illness, exacerbation or injury? None of the above  Current Functional Level Cognition  Overall Cognitive Status: Within Functional Limits for tasks assessed Orientation Level: Oriented X4    Extremity  Assessment (includes Sensation/Coordination)  Upper Extremity Assessment: Generalized weakness  Lower Extremity Assessment: Defer to PT evaluation    ADLs  Overall ADL's : Needs assistance/impaired Grooming: Min guard, Standing, Oral care Upper Body Bathing: Minimal assistance, Sitting Lower Body Bathing: Moderate assistance, Sit to/from stand Upper Body Dressing : Minimal assistance, Sitting Lower Body Dressing: Moderate assistance, Sit to/from stand Lower Body Dressing Details (indicate cue type and reason): able to figure 4 with R LE but not L LE  Toilet Transfer: Moderate assistance, Ambulation, +2 for safety/equipment Toilet Transfer Details (indicate cue type and reason): simulated to/from recliner  Toileting- Clothing Manipulation and Hygiene: Moderate assistance, Sit to/from stand Functional mobility during ADLs: Minimal assistance, Rolling walker General ADL Comments: pt appears SOB with talking, but Sp02 remained in mid 90s    Mobility  Overal bed mobility: Needs Assistance Bed Mobility: Supine to Sit Supine to sit: Min assist General bed mobility comments: pulled up on therapist's hand, use of rail, HOB up 20 degrees    Transfers  Overall transfer level: Needs assistance Equipment used: Rolling walker (2 wheeled) Transfers: Sit to/from Stand Sit to Stand: Min guard General transfer comment: cues for hand placement, increased time and effort    Ambulation / Gait / Stairs / Psychologist, prison and probation services  Ambulation/Gait Ambulation/Gait assistance: Land (Feet): 110 Feet Assistive device: Rolling walker (2 wheeled) Gait Pattern/deviations: Step-through pattern, Decreased stride length, Trunk flexed General Gait Details: Pt with cues for posture and position in RW. Pt fatigued with gait and unable to tolerate further gait Gait velocity interpretation: >2.62 ft/sec, indicative of community ambulatory    Posture / Balance Balance Overall balance assessment:  Needs assistance Sitting-balance support: No upper extremity supported, Feet supported Sitting balance-[name]: Fair Standing balance support: Bilateral upper extremity supported, During functional activity Standing balance-[name]: Poor Standing balance comment: bil UE support on RW with gait    Special needs/care consideration BiPAP/CPAP n/a CPM n/a Continuous Drip IV n/a Dialysis n/a Life Vest n/a Oxygen n/a Special Bed n/a Trach Size n/a Wound Vac n/a Skin ecchymosis BUE Bowel mgmt: incontinent LBM 10/26/2018 Bladder mgmt:continent Diabetic mgmt yes Behavioral consideration  N/a Chemo/radiation  N/a   Previous Home Environment  Living Arrangements: Alone  Lives With: Alone Available Help at Discharge: Family, Available PRN/intermittently Type of Home: House Home Layout: One level Home Access: Ramped entrance Bathroom Shower/Tub: Curtain,  Tub/shower unit Teacher, early years/preBathroom Toilet: Standard Bathroom Accessibility: Yes How Accessible: Accessible via walker Home Care Services: No  Discharge Living Setting Plans for Discharge Living Setting: Patient's home, Alone Type of Home at Discharge: House Discharge Home Layout: One level Discharge Home Access: Ramped entrance Discharge Bathroom Shower/Tub: Tub/shower unit, Curtain Discharge Bathroom Toilet: Standard Discharge Bathroom Accessibility: Yes How Accessible: Accessible via walker Does the patient have any problems obtaining your medications?: No  Social/Family/Support Systems Contact Information: sister Larita FifeLynn, who is out of town Anticipated Caregiver: friends intermittently Anticipated Industrial/product designerCaregiver's Contact Information: see above Caregiver Availability: Intermittent Discharge Plan Discussed with Primary Caregiver: Yes Is Caregiver In Agreement with Plan?: Yes Does Caregiver/Family have Issues with Lodging/Transportation while Pt is in Rehab?: No  Goals/Additional Needs Patient/Family Goal for Rehab: Mod I to  supervision PT, OT and SLP Expected length of stay: ELOS 10 to 12 days Pt/Family Agrees to Admission and willing to participate: Yes Program Orientation Provided & Reviewed with Pt/Caregiver Including Roles  & Responsibilities: Yes  Decrease burden of Care through IP rehab admission: n/a  Possible need for SNF placement upon discharge:not anticipated  Patient Condition: This patient's medical and functional status has changed since the consult dated: 10/21/2018 in which the Rehabilitation Physician determined and documented that the patient's condition is appropriate for intensive rehabilitative care in an inpatient rehabilitation facility. See "History of Present Illness" (above) for medical update. Functional changes are: min assist, 110' with RW. Patient's medical and functional status update has been discussed with the Rehabilitation physician and patient remains appropriate for inpatient rehabilitation. Will admit to inpatient rehab today.  Preadmission Screen Completed By: Ottie GlazierBarbara Boyette RN MSN with updates by Estill Doomsaitlin Karimah Winquist, PT, DPT time 12:07 PM, 10/27/18    Estill Doomsaitlin Sinia Antosh, PT, DPT Admissions Coordinator [phone] 10/27/18  12:07 PM  _____________________   Discussed status with Dr. Riley KillSwartz on 10/27/18 at 12:07 PM  and received approval for admission today.  Admission Coordinator: Ottie GlazierBarbara Boyette RN MSN 10/24/2018 at 1600

## 2018-10-24 NOTE — Progress Notes (Addendum)
Inpatient Rehabilitation Admissions Coordinator  Inpt rehab bed not readily available to admit this pt. I met with patient at bedside and he is aware. Recommend SNF at this time as no CIR bed available into next week. I will follow up Monday if pt remains in house with CIR bed availability..RN CM, Hassan Rowan, is aware.  Danne Baxter, RN, MSN Rehab Admissions Coordinator [phone] 10/24/2018 12:56 PM

## 2018-10-24 NOTE — Progress Notes (Signed)
Physical Therapy Treatment Patient Details Name: Justin Bradford. MRN: [id number] DOB: 08/02/1940 Today's Date: 10/24/2018    History of Present Illness 78yo male who presented with PMH of COPD, HTN, HLD, BPH, T2DM who presented with chest pain and shortness of breath with positive NSTEMI and was treated for LAD occlusion with DES stent, HF exacerbation and CAP without improvement in dyspnea or hypoxia.    PT Comments    Justin Bradford reports having slept for 9 hours and having trouble getting going today. Pt with decreased gait tolerance from last session but able to walk in hall with less supportive RW vs eva walker and perform HEP. Pt normally very independent and active with CIR still appropriate. Pt educated for HEP and encouraged to increase mobility with assist of staff.   HR 83 SPo2 93% on RA   Follow Up Recommendations  CIR;Supervision/Assistance - 24 hour     Equipment Recommendations  Rolling walker with 5" wheels;3in1 (PT)    Recommendations for Other Services       Precautions / Restrictions Precautions Precautions: Fall    Mobility  Bed Mobility Overal bed mobility: Needs Assistance Bed Mobility: Supine to Sit     Supine to sit: Min assist     General bed mobility comments: pulled up on therapist's hand, use of rail, HOB up 20 degrees  Transfers Overall transfer level: Needs assistance   Transfers: Sit to/from Stand Sit to Stand: Min guard         General transfer comment: cues for hand placement, increased time and effort  Ambulation/Gait Ambulation/Gait assistance: Min guard Gait Distance (Feet): 110 Feet Assistive device: Rolling walker (2 wheeled) Gait Pattern/deviations: Step-through pattern;Decreased stride length;Trunk flexed   Gait velocity interpretation: >2.62 ft/sec, indicative of community ambulatory General Gait Details: Pt with cues for posture and position in RW. Pt fatigued with gait and unable to tolerate further gait   Stairs              Wheelchair Mobility    Modified Rankin (Stroke Patients Only)       Balance Overall balance assessment: Needs assistance Sitting-balance support: No upper extremity supported;Feet supported Sitting balance-[name]: Fair     Standing balance support: Bilateral upper extremity supported;During functional activity Standing balance-[name]: Poor Standing balance comment: bil UE support on RW with gait                            Cognition Arousal/Alertness: Awake/alert Behavior During Therapy: WFL for tasks assessed/performed Overall Cognitive Status: Within Functional Limits for tasks assessed                                        Exercises General Exercises - Lower Extremity Long Arc Quad: AROM;15 reps;Seated;Both Hip Flexion/Marching: AROM;10 reps;Seated;Both    General Comments        Pertinent Vitals/Pain Pain Assessment: No/denies pain    Home Living                      Prior Function            PT Goals (current goals can now be found in the care plan section) Progress towards PT goals: Progressing toward goals    Frequency           PT Plan Current plan remains appropriate  Co-evaluation              AM-PAC PT "6 Clicks" Mobility   Outcome Measure  Help needed turning from your back to your side while in a flat bed without using bedrails?: A Little Help needed moving from lying on your back to sitting on the side of a flat bed without using bedrails?: A Little Help needed moving to and from a bed to a chair (including a wheelchair)?: A Little Help needed standing up from a chair using your arms (e.g., wheelchair or bedside chair)?: A Little Help needed to walk in hospital room?: A Little Help needed climbing 3-5 steps with a railing? : A Lot 6 Click Score: 17    End of Session Equipment Utilized During Treatment: Gait belt Activity Tolerance: Patient limited by  fatigue Patient left: with call bell/phone within reach;in chair;with chair alarm set Nurse Communication: Mobility status PT Visit Diagnosis: Unsteadiness on feet (R26.81);Muscle weakness (generalized) (M62.81);Other abnormalities of gait and mobility (R26.89)     Time: 1056-1130 PT Time Calculation (min) (ACUTE ONLY): 34 min  Charges:  $Gait Training: 8-22 mins $Therapeutic Exercise: 8-22 mins                     Justin Bradford, PT Acute Rehabilitation Services Pager: [phone] Office: [phone]    Justin Bradford 10/24/2018, 11:32 AM

## 2018-10-24 NOTE — Consult Note (Addendum)
Name: Justin Bradford. MRN: [id number] DOB: [birth year]    ADMISSION DATE:  10/15/2018 CONSULTATION DATE: 10/24/2018  REFERRING MD : Cardiology  CHIEF COMPLAINT: Original complaint was chest pain.  No complaints of breathing at this time.  BRIEF PATIENT DESCRIPTION: 78 year old obese male in no acute distress at rest but severely weakened  SIGNIFICANT EVENTS  10/15/2018 admitted with MI  STUDIES:  10/15/2018 chest x-ray with changes of COPD cephalization of vascular consistent with edema. CT of the chest 10/15/2018. Opacity right upper lobe with negative for pulmonary embolism. Chest x-ray 10/19/2018 shows increased bilateral airspace disease with right lung greater Chest x-ray 10/19/2018 no significant change demonstrates a new PICC line Chest x-ray 10/23/2018 shows but airspace disease bilateral but improved CT scan 10/23/2018 shows new right upper lobe opacity left upper lobe smaller opacity bilateral pleural effusions. Weight 4/15/2020133.4 kg Weight 10/24/2018 127 kg   HISTORY OF PRESENT ILLNESS: Justin Bradford is a 78 year old male who was originally admitted to [facility] with chest pain.  Transferred to [facility] at which time he underwent a cardiac catheterization to stent LAD.  CT scan at that time on 415 that showed groundglass opacities right upper lobe.  Was negative for PE.  Chest x-ray revealed changes of COPD with hyperinflation.  He had a repeat CT scan of 10/23/2018 with new right upper lobe opacity left lower lobe small opacity bilateral pleural effusions changes consistent with edema.  Note his white count is been elevated 1718 over the last for 5 days.  Procalcitonin 417 was 0.38.  He has been afebrile for greater than a week.  His  room air sats are 94%.  Noted his CVP is been as low as 4 as high as 16 and has been aggressively diuresed by the cardiology heart failure team.  He has ejection fraction noted to be 30 to 35%.  10/24/2018 pulmonary critical care  asked to evaluate CT findings.  He notes that he is feeling better.  Reports he is sleeping better and he slept 4 weeks.  He is on room air with O2 sats of 94%.  He denies fevers chills sweats or purulent sputum.  He did report one episode of hemoptysis while in intensive care unit but noted out he is on anticoagulants at the time.  He is extremely deconditioned at this time.  He was a heavy smoker 1 to 3 packs a day from 1961 until 2007.  His chest x-ray and CT scan are consistent with chronic obstructive pulmonary disease.  He is on a good choice for bronchodilators at this time.  We note that his weight on 10/15/2018 is 133.4 kg and on 10/24/2018 was 127 kg.  I suspect that the aggressive diuresis is improving his pulmonary function is more than anything else.  Suspect that we can follow him as an outpatient with a repeat CT scan to evaluate opacities as he does not appear to be in acute distress at this time.  Do not think invasive bronchoscopy would help into his weakened state.  Agree with aggressive diuresis.  Transition to rehab facility.  Continue bronchodilators.  PRN oxygen.  PAST MEDICAL HISTORY :   has a past medical history of Hypertension and Peripheral neuropathy.  has a past surgical history that includes Knee surgery; LEFT HEART CATH AND CORONARY ANGIOGRAPHY (N/A, 10/16/2018); and CORONARY STENT INTERVENTION (N/A, 10/16/2018). Prior to Admission medications   Medication Sig Start Date End Date Taking? Authorizing Provider  aspirin EC 81 MG tablet  Take 81 mg by mouth daily.   Yes [provider]  BREO ELLIPTA 100-25 MCG/INH AEPB Inhale 1 puff into the lungs daily. 09/25/18  Yes [provider]  FLUoxetine (PROZAC) 20 MG capsule Take 60 mg by mouth every morning. 01/28/17  Yes [provider]  hydrochlorothiazide (HYDRODIURIL) 50 MG tablet Take 50 mg by mouth every morning.    Yes [provider]  lamoTRIgine (LAMICTAL) 25 MG tablet Take 75 mg by mouth at  bedtime. 01/28/17  Yes [provider]  losartan (COZAAR) 25 MG tablet Take 50 mg by mouth every morning. 08/16/18  Yes [provider]  metFORMIN (GLUCOPHAGE) 500 MG tablet Take 250 mg by mouth every morning.    Yes [provider]  omeprazole (PRILOSEC) 20 MG capsule Take 20 mg by mouth every morning. 05/10/18  Yes [provider]  simvastatin (ZOCOR) 40 MG tablet Take 40 mg by mouth every morning.    Yes [provider]  spironolactone (ALDACTONE) 25 MG tablet Take 25 mg by mouth 2 (two) times daily.    Yes [provider]  tamsulosin (FLOMAX) 0.4 MG CAPS capsule Take 0.4 mg by mouth at bedtime.    Yes [provider]  VENTOLIN HFA 108 (90 Base) MCG/ACT inhaler Inhale 1 puff into the lungs every 4 (four) hours as needed for wheezing. 09/11/18  Yes [provider]  metoprolol succinate (TOPROL-XL) 50 MG 24 hr tablet Take 50 mg by mouth daily. 05/10/18   [provider]   No Known Allergies  FAMILY HISTORY:  family history is not on file. SOCIAL HISTORY:  reports that he has quit smoking. He has never used smokeless tobacco. He reports current alcohol use. He reports that he does not use drugs.  1 to 3 packs a day cigarettes from 20 62-2007.  Denies alcohol use.  REVIEW OF SYSTEMS:   10 point review of system taken, please see HPI for positives and negatives.   SUBJECTIVE:  78 year old status post MI he was in severely weakened state but appears to be improving. VITAL SIGNS: Temp:  [97.8 F (36.6 C)-98.4 F (36.9 C)] 98.4 F (36.9 C) (04/24 0500) Pulse Rate:  [72-84] 83 (04/24 1117) Resp:  [20-21] 20 (04/24 0500) BP: (94-108)/(52-74) 108/56 (04/24 0500) SpO2:  [92 %-93 %] 93 % (04/24 1117) Weight:  [127.4 kg] 127.4 kg (04/24 0500)  PHYSICAL EXAMINATION: General: Obese male who is in a weakened state Neuro: Grossly intact, moves all extremities, speech is clear he is pleasant to interact with. HEENT: Full  beard.  No JVD or lymphadenopathy is Cardiovascular: Heart sounds are regular regular rate and rhythm Lungs: Decreased air movement throughout with fine basilar crackles Abdomen: Obese soft positive bowel sounds Musculoskeletal: Intact Skin: 1+ edema lower extremity  Recent Labs  Lab 10/22/18 0500 10/23/18 0153 10/24/18 0442  NA 134* 134* 134*  K 3.9 3.7 3.6  CL 97* 98 96*  CO2 25 22 25   BUN 27* 25* 24*  CREATININE 1.22 1.31* 1.33*  GLUCOSE 121* 160* 125*   Recent Labs  Lab 10/22/18 0500 10/23/18 0153 10/24/18 0923  HGB 13.0 13.1 12.7*  HCT 39.5 39.5 39.4  WBC 15.6* 17.8* 18.7*  PLT 330 341 363   Dg Abd 1 View  Result Date: 10/23/2018 CLINICAL DATA:  Abdominal pain, hypoxia EXAM: ABDOMEN - 1 VIEW COMPARISON:  None. FINDINGS: Mild diffuse gaseous distention of bowel. No evidence of obstruction, organomegaly or free air. No suspicious calcification. Bibasilar atelectasis. IMPRESSION: Mild  diffuse gaseous distention of bowel. No evidence for obstruction or free air. Electronically Signed   By: Charlett NoseKevin  Bradford M.D.   On: 10/23/2018 02:20   Ct Chest Wo Contrast  Result Date: 10/23/2018 CLINICAL DATA:  Pneumonia. EXAM: CT CHEST WITHOUT CONTRAST TECHNIQUE: Multidetector CT imaging of the chest was performed following the standard protocol without IV contrast. COMPARISON:  Radiograph of same day.  CT scan of October 15, 2018. FINDINGS: Cardiovascular: Atherosclerosis of thoracic aorta is noted. 4.3 cm ascending thoracic aortic aneurysm is noted. Mild cardiomegaly is noted. No pericardial effusion is noted. Coronary artery calcifications are noted. Mediastinum/Nodes: No enlarged mediastinal or axillary lymph nodes. Thyroid gland, trachea, and esophagus demonstrate no significant findings. Lungs/Pleura: No pneumothorax is noted. Small minimal pleural effusions are noted bilaterally with adjacent subsegmental atelectasis. Increased interstitial opacities are noted in the right upper lobe  concerning for atypical pneumonia. New multiple opacities are noted in the left upper lobe concerning for pneumonia. Upper Abdomen: No acute abnormality. Musculoskeletal: No chest wall mass or suspicious bone lesions identified. IMPRESSION: New large interstitial opacity is noted in the right upper lobe, as well as multiple smaller opacities are noted in the left upper lobe. These findings are concerning for multifocal interstitial or atypical pneumonia. Minimal bilateral pleural effusions are noted with adjacent subsegmental atelectasis. 4.3 cm ascending thoracic aortic aneurysm. Recommend annual imaging followup by CTA or MRA. This recommendation follows 2010 ACCF/AHA/AATS/ACR/ASA/SCA/SCAI/SIR/STS/SVM Guidelines for the Diagnosis and Management of Patients with Thoracic Aortic Disease. Circulation. 2010; 121: Z610-R604: E266-e369. Aortic aneurysm NOS (ICD10-I71.9). Coronary artery calcifications are noted suggesting coronary artery disease. Aortic Atherosclerosis (ICD10-I70.0). Electronically Signed   By: Lupita RaiderJames  Green Jr M.D.   On: 10/23/2018 13:26   Dg Chest Port 1 View  Result Date: 10/23/2018 CLINICAL DATA:  Abdominal pain, hypoxia EXAM: PORTABLE CHEST 1 VIEW COMPARISON:  10/20/2018 FINDINGS: Right PICC line remains in place, unchanged. Cardiomegaly. Right greater than left airspace opacities are again noted. Some improvement on the left since prior study. There is also been improvement in the right lower lung lobe since prior study. Stable consolidation in the right upper lobe. No visible effusions or acute bony abnormality. IMPRESSION: Bilateral airspace opacities, right greater than left, improving since prior study. Cardiomegaly. Electronically Signed   By: Charlett NoseKevin  Bradford M.D.   On: 10/23/2018 02:21    ASSESSMENT / PLAN: CT assessment of chest on 10/23/2018 reveals new right upper lobe opacity left upper lobe small opacity with bilateral pleural effusion in the setting of chest pain with cardiac catheterization  completed on 10/16/2018 by Dr. Excell Seltzerooper with occlusion of LAD with stent placement.  He did require IV Lasix and vasopressor support.  He did have bouts of atrial fibrillation requiring IV amiodarone along with Brilinta and low-dose aspirin.  He had one episode of hemoptysis while in ICU.  Repeat CT scan of the chest 10/23/2018 with bilateral opacities. Review CT scans with Dr. Everardo AllEllison Note he is on room air with adequate saturations of 94% He is severely weakened secondary to age of 78 years old coupled with acute MI requiring stent and severely debilitated. He did have one episode of hemoptysis while in ICU He denies fevers chills or sweats or productive sputum Most likely best course of action would be to reevaluate in 4 to 6 weeks after he is completed CRRT with repeat CT scan.  Note he has been aggressively diuresed and does seem to be a correlation between his opacities and his edema state. Check procalcitonin for completeness CT  scan can be done as an outpatient. Can have him call our office and set up a telemedicine appointment with the CT scan to be ordered.  Depending on how the COVID-19 pandemic is progressing. PCCM available as needed.  Chronic structural pulmonary disease secondary to 46 years of 1 to 3 packs a day smoker He may end up being O2 dependent Resume his home pulmonary medications of Breo 100/25 1 puff in lungs daily and lama Spiriva 2 puffs In 24 Basis Via 1cap I suspect diuresis will help with breathing more than anything else  Diabetes mellitus type 2 Per primary  Parkinsonian features treated with Lamictal Prozac treatment Per primary      Brett Canales Kashif Pooler ACNP [facility] PCCM Pager [phone] till 1 pm If no answer page [phone] 10/24/2018, 11:38 AM

## 2018-10-24 NOTE — Progress Notes (Addendum)
Subjective: HD#9   Overnight: No acute events reported   Today, he feels groggy. Thinks that he slept too much and that is making it harder for him to wake up this morning. He also notes slight increased work of breathing "I'm straining to breath and have to really think about breathing in order to breath deep." He wasn't able to walk yesterday or work with PT because he was on the toilet for an hour with a lot of gas and also had to be cleaned up again. He does not feel constipated. He did sit in the recliner for an hour yesterday  His belly feels flat and denies pain. He continues to feel overall better every day. He would like to walk today.   Objective:  Vital signs in last 24 hours: Vitals:   10/23/18 1712 10/23/18 2015 10/23/18 2135 10/24/18 0500  BP: 100/74  (!) 94/52 (!) 108/56  Pulse: 84  75 72  Resp:   (!) 21 20  Temp:   97.8 F (36.6 C) 98.4 F (36.9 C)  TempSrc:   Oral Oral  SpO2:  92% 92% 92%  Weight:    127.4 kg  Height:        Const: In NAD, lying in bed comfortably  Resp: Minimal rales R>L CV:RRR, no MGR Abd:BS+, NTTP Skin: Palmar erythema bilaterally   Assessment/Plan:  Principal Problem:   Acute anterior myocardial infarction [facility]) Active Problems:   Chest pain   COPD exacerbation (HCC)   Atypical pneumonia   Elevated troponin   Acute systolic heart failure (HCC)   Status post coronary artery stent placement   Myocardial infarction involving left anterior descending (LAD) coronary artery (HCC)   Supplemental oxygen dependent   Essential hypertension   Steroid-induced hyperglycemia   Diabetes mellitus type 2 in obese (HCC)   Tobacco abuse   Leukocytosis   Hyponatremia   Acute hypoxemic respiratory failure (HCC)  Justin Bradford is a 78 yo M w/ PMH of HTN, HLD, T2DM, BPH, obesitywhopresentedwith acute onsetchest painfound to have Acute anterior MI with 100% occlusion ofproximalLED s/p DES. Also here for management of Acute HFrEF, Hypoxic  respiratory failure, CAP and A-Fibb.  #Acute anterior MI:He is status post PCI with DES to LAD now with about 55% residual stenosis thought to be secondary to heavy calcification. Stable clinical course otherwise -Continue aspirin 81 mg daily, Brilinta -Continue Lipitor 80 mg daily -ContinueCoreg -Appreciate cardiology recommendation  #AcuteHFrEF: No sign of volume overload on exams today. He is off supplemental oxygen. I/O with net positive 830 (UOP 780).  -Continue POLasix 80mg  mg daily -Strict intake and output -ContinueCoreg, spironolactone and losartan  -Continue cardiac monitoring  #Acute onset abdominal pain: No recurrence of abdominal pain -Encourage ambulation -Continue to monitor  #New onset A. fib with RVR-resolved: In SR.  -Heparin discontinued  -Continue oral amiodarone -Continue DAPT -If he converts back to A-fibb, will transition to Eliquis + plavix  #Acute hypoxic respiratory failure:Improved. CT chest revealed new large interstitial opacity of the right upper lobe, small left upper lobe small opacity, minimal bilateral pleural effusion compared to CT chest that was performed on October 15, 2018.  Differential diagnosis includes fungal pneumonia, atypical pneumonia, viral pneumonia, occupational or environmental exposure, drug induced, radiation-induced, idiopathic.  Image findings does not reveal hilar adenopathy and laboratory work does not reveal hypercalcemia making sarcoidosis less likely.  Chest x-ray in 2017 reviewed bronchitic changes with probable lingular scarring.  Currently our working theory is probable viral pneumonitis vs lung parenchymal  changes from CAP however he remains asymptomatic and his respiratory function gradually continues to improve. He is now off supplemental oxygen.  -s/p completion of antibiotics for CAP -Continue nebulizer treatment -Supplemental O2 prn   #Acute kidney injury: Stable. sCr 1.3<<1.3<<1.2 -Continue to monitor     #Parkinsonian features: -Continue lamotrigine and fluoxetine  #COPD -Continue nebulizers withpulmicort, brovana  #Hypertension -BP stable  -ContinueCoreg, spironolactone and losartan   #Hypokalemia:Resolved  #Generalized anxiety disorder:  -Clonazepam 1 mg qhs  #Aortic Aneurysm: CT Chest revealed 4.3 cm ascending thoracic aortic aneurysm -Annual imaging followup by CTA or MRA  BTY:OMAYOKH electrolytes as needed, heart healthy diet VTE TXH:FSFSELT CODE STATUS:Full code  Dispo: Anticipatedtransferto [employer] authorization  Justin Rack, MD 10/24/2018, 6:16 AM Pager: [phone] IMTS PGY-1

## 2018-10-24 NOTE — [place] FL2 (Signed)
[place] MEDICAID FL2 LEVEL OF CARE SCREENING TOOL     IDENTIFICATION  Patient Name: Justin Bradford. Birthdate: March 07, 1941 Sex: male Admission Date (Current Location): 10/15/2018  [facility] and [place] Number:  Producer, television/film/video and Address:  [facility], [address], [place], [place] [address]      Provider Number: [id number]  Attending Physician Name and Address:  Burns Spain, MD  Relative Name and Phone Number:  Larita Fife (sister) [phone]    Current Level of Care: Hospital Recommended Level of Care: Skilled Nursing Facility Prior Approval Number:    Date Approved/Denied:   PASRR Number: [id number]  Discharge Plan: SNF    Current Diagnoses: Patient Active Problem List   Diagnosis Date Noted  . Acute hypoxemic respiratory failure (HCC)   . Myocardial infarction involving left anterior descending (LAD) coronary artery (HCC) 10/21/2018  . Supplemental oxygen dependent   . Essential hypertension   . Steroid-induced hyperglycemia   . Diabetes mellitus type 2 in obese (HCC)   . Tobacco abuse   . Leukocytosis   . Hyponatremia   . Status post coronary artery stent placement   . Acute systolic heart failure (HCC)   . Acute anterior myocardial infarction (HCC)   . Atypical pneumonia   . Elevated troponin   . Chest pain 10/15/2018  . COPD exacerbation (HCC) 10/15/2018  . Left wrist injury 03/22/2016  . Bilateral primary osteoarthritis of knee 03/15/2015    Orientation RESPIRATION BLADDER Height & Weight     Self, Time, Situation, Place  O2(nasal cannula 2-3L/min ) Continent Weight: 280 lb 12.8 oz (127.4 kg) Height:  6' (182.9 cm)  BEHAVIORAL SYMPTOMS/MOOD NEUROLOGICAL BOWEL NUTRITION STATUS      Continent Diet(see discharge summary)  AMBULATORY STATUS COMMUNICATION OF NEEDS Skin   Limited Assist Verbally Other (Comment)(dermatitis on legs)                       Personal Care Assistance Level of Assistance   Bathing, Feeding, Dressing, Total care Bathing Assistance: Independent Feeding assistance: Independent Dressing Assistance: Independent Total Care Assistance: Limited assistance   Functional Limitations Info  Sight, Hearing, Speech Sight Info: Adequate Hearing Info: Adequate Speech Info: Adequate    SPECIAL CARE FACTORS FREQUENCY  PT (By licensed PT), OT (By licensed OT)     PT Frequency: min 5x weekly OT Frequency: min 5x weekly            Contractures Contractures Info: Not present    Additional Factors Info  Code Status, Allergies Code Status Info: full Allergies Info: No Known Allergies           Current Medications (10/24/2018):  This is the current hospital active medication list Current Facility-Administered Medications  Medication Dose Route Frequency Provider Last Rate Last Dose  . acetaminophen (TYLENOL) tablet 650 mg  650 mg Oral Q4H PRN Lennette Bihari, MD   650 mg at 10/18/18 0231  . albuterol (PROVENTIL) (2.5 MG/3ML) 0.083% nebulizer solution 2.5 mg  2.5 mg Nebulization Q4H PRN Bloomfield, Carley D, DO   2.5 mg at 10/22/18 0750  . alum & mag hydroxide-simeth (MAALOX/MYLANTA) 200-200-20 MG/5ML suspension 30 mL  30 mL Oral Once Theotis Barrio, MD       And  . lidocaine (XYLOCAINE) 2 % viscous mouth solution 15 mL  15 mL Oral Once Theotis Barrio, MD      . alum & mag hydroxide-simeth (MAALOX/MYLANTA) 200-200-20 MG/5ML suspension 30  mL  30 mL Oral Q4H PRN Burns Spain, MD   30 mL at 10/23/18 0032  . amiodarone (PACERONE) tablet 200 mg  200 mg Oral BID Laurey Morale, MD   200 mg at 10/24/18 0858  . arformoterol (BROVANA) nebulizer solution 15 mcg  15 mcg Nebulization BID Nyra Market, MD   15 mcg at 10/24/18 0844  . aspirin chewable tablet 81 mg  81 mg Oral Daily Lennette Bihari, MD   81 mg at 10/24/18 0857  . atorvastatin (LIPITOR) tablet 80 mg  80 mg Oral q1800 Lennette Bihari, MD   80 mg at 10/23/18 1712  . budesonide (PULMICORT) nebulizer  solution 0.25 mg  0.25 mg Nebulization BID Nyra Market, MD   0.25 mg at 10/24/18 0844  . carvedilol (COREG) tablet 3.125 mg  3.125 mg Oral BID WC Laurey Morale, MD   3.125 mg at 10/24/18 0857  . clonazePAM (KLONOPIN) tablet 1 mg  1 mg Oral QHS Agyei, Obed K, MD   1 mg at 10/23/18 2126  . enoxaparin (LOVENOX) injection 40 mg  40 mg Subcutaneous Q24H Bensimhon, Bevelyn Buckles, MD      . FLUoxetine (PROZAC) capsule 60 mg  60 mg Oral Daily Theotis Barrio, MD   60 mg at 10/24/18 0857  . furosemide (LASIX) tablet 80 mg  80 mg Oral Daily Bensimhon, Bevelyn Buckles, MD   80 mg at 10/24/18 0857  . lamoTRIgine (LAMICTAL) tablet 75 mg  75 mg Oral QHS Theotis Barrio, MD   75 mg at 10/23/18 2127  . losartan (COZAAR) tablet 12.5 mg  12.5 mg Oral Daily Laurey Morale, MD   12.5 mg at 10/24/18 0857  . morphine 2 MG/ML injection 2-4 mg  2-4 mg Intravenous Q1H PRN Eulah Pont, MD   4 mg at 10/23/18 0209  . ondansetron (ZOFRAN) injection 4 mg  4 mg Intravenous Q6H PRN Lennette Bihari, MD   4 mg at 10/16/18 1953  . pantoprazole (PROTONIX) EC tablet 40 mg  40 mg Oral Daily Theotis Barrio, MD   40 mg at 10/24/18 0858  . potassium chloride SA (K-DUR) CR tablet 40 mEq  40 mEq Oral Once Bensimhon, Bevelyn Buckles, MD      . Melene Muller ON 10/25/2018] potassium chloride SA (K-DUR) CR tablet 40 mEq  40 mEq Oral Daily Bensimhon, Daniel R, MD      . sodium chloride flush (NS) 0.9 % injection 10-40 mL  10-40 mL Intracatheter Q12H Burns Spain, MD   20 mL at 10/24/18 1135  . sodium chloride flush (NS) 0.9 % injection 10-40 mL  10-40 mL Intracatheter PRN Burns Spain, MD      . spironolactone (ALDACTONE) tablet 12.5 mg  12.5 mg Oral Daily Bensimhon, Bevelyn Buckles, MD   12.5 mg at 10/24/18 0857  . tamsulosin (FLOMAX) capsule 0.4 mg  0.4 mg Oral QHS Theotis Barrio, MD   0.4 mg at 10/23/18 2127  . ticagrelor (BRILINTA) tablet 90 mg  90 mg Oral BID Lennette Bihari, MD   90 mg at 10/24/18 5284     Discharge Medications: Please see discharge  summary for a list of discharge medications.  Relevant Imaging Results:  Relevant Lab Results:   Additional Information SSN: [id number]  Gildardo Griffes, LCSW

## 2018-10-24 NOTE — TOC Initial Note (Signed)
Transition of Care (TOC) - Initial/Assessment Note    Patient Details  Name: Justin Bradford. MRN: [id number] Date of Birth: 31-Mar-1941  Transition of Care [facility]) CM/SW Contact:    Justin Griffes, LCSW Phone Number: 10/24/2018, 12:19 PM  Clinical Narrative:                  CSW consulted with patient regarding SNF recommendation. Patient in agreement to go to Skilled Nursing Facility for short term rehab. He reports no being familiar with many places except [facility] or [facility], as he lives in [place]. Patient gave CSW permission to fax referrals to SNFs in [facility] and [place] and then notify patient with bed offers available.   Expected Discharge Plan: Skilled Nursing Facility Barriers to Discharge: No Barriers Identified   Patient Goals and CMS Choice Patient states their goals for this hospitalization and ongoing recovery are:: to go to rehab then home CMS Medicare.gov Compare Post Acute Care list provided to:: Patient Represenative (must comment) Choice offered to / list presented to : Patient  Expected Discharge Plan and Services Expected Discharge Plan: Skilled Nursing Facility In-house Referral: NA Discharge Planning Services: NA Post Acute Care Choice: Skilled Nursing Facility Living arrangements for the past 2 months: Single Family Home                 DME Arranged: N/A DME Agency: NA       HH Arranged: NA HH Agency: NA        Prior Living Arrangements/Services Living arrangements for the past 2 months: Single Family Home Lives with:: Self Patient language and need for interpreter reviewed:: Yes Do you feel safe going back to the place where you live?: No   needs short term rehab before returing home  Need for Family Participation in Patient Care: Yes (Comment) Care giver support system in place?: Yes (comment) Current home services: (N/A) Criminal Activity/Legal Involvement Pertinent to Current Situation/Hospitalization: No - Comment as  needed  Activities of Daily Living Home Assistive Devices/Equipment: None ADL Screening (condition at time of admission) Patient's cognitive ability adequate to safely complete daily activities?: Yes Is the patient deaf or have difficulty hearing?: No Does the patient have difficulty seeing, even when wearing glasses/contacts?: No Does the patient have difficulty concentrating, remembering, or making decisions?: No Patient able to express need for assistance with ADLs?: Yes Does the patient have difficulty dressing or bathing?: No Independently performs ADLs?: Yes (appropriate for developmental age) Does the patient have difficulty walking or climbing stairs?: No Weakness of Legs: Both Weakness of Arms/Hands: None  Permission Sought/Granted Permission sought to share information with : Case Manager, Magazine features editor, Family Supports Permission granted to share information with : Yes, Verbal Permission Granted  Share Information with NAME: Larita Fife  Permission granted to share info w AGENCY: SNFs  Permission granted to share info w Relationship: sister  Permission granted to share info w Contact Information: [phone]  Emotional Assessment Appearance:: Other (Comment Required(unable to assess-called into room) Attitude/Demeanor/Rapport: Gracious Affect (typically observed): Accepting Orientation: : Oriented to Self, Oriented to  Time, Oriented to Place, Oriented to Situation Alcohol / Substance Use: Not Applicable Psych Involvement: No (comment)  Admission diagnosis:  Atypical pneumonia [J18.9] Elevated troponin [R79.89] COPD exacerbation (HCC) [J44.1] Patient Active Problem List   Diagnosis Date Noted  . Acute hypoxemic respiratory failure (HCC)   . Myocardial infarction involving left anterior descending (LAD) coronary artery (HCC) 10/21/2018  . Supplemental oxygen dependent   .  Essential hypertension   . Steroid-induced hyperglycemia   . Diabetes mellitus  type 2 in obese (HCC)   . Tobacco abuse   . Leukocytosis   . Hyponatremia   . Status post coronary artery stent placement   . Acute systolic heart failure (HCC)   . Acute anterior myocardial infarction (HCC)   . Atypical pneumonia   . Elevated troponin   . Chest pain 10/15/2018  . COPD exacerbation (HCC) 10/15/2018  . Left wrist injury 03/22/2016  . Bilateral primary osteoarthritis of knee 03/15/2015   PCP:  Forrest Moron, MD Pharmacy:   [facility] [address], [place] - [address] [place] [address] Phone: [phone] Fax: [phone]     Social Determinants of Health (SDOH) Interventions    Readmission Risk Interventions No flowsheet data found.

## 2018-10-24 NOTE — Progress Notes (Addendum)
Patient ID: Justin PoseyBruce D Waterbury Jr., male   DOB: 07/29/1940, 78 y.o.   MRN: [id number]    Advanced Heart Failure Rounding Note   Subjective:    Remains in NSR on PO amio. Heparin drip stopped yesterday and started ASA + tricagrelor.   Co-ox 66%. Creatinine stable 1.33. K 3.6. CVP 2-3. O2 sats 88-92% on RA. Non-contrast chest CT repeated yesterday. See below. Remains off abx. Tmax 100.0 yesterday morning. CBC pending.  Chest CT 4/23: new large Insterstitial opacity in RUL and multiple smaller opacities in LUL, concerning for multifocial interstitial or atypical PNA. Also showed 4.3 cm ascending TAA, recommending annual f/u by CTA or MRA  Says he slept for 9 hours yesterday and feels much better. Denies orthopnea or PND. Anxious to go to CIR. Tmax 100.0  Planning for CIR at [place].   Objective:   Weight Range:  Vital Signs:   Temp:  [97.8 F (36.6 C)-100 F (37.8 C)] 98.4 F (36.9 C) (04/24 0500) Pulse Rate:  [72-84] 72 (04/24 0500) Resp:  [19-21] 20 (04/24 0500) BP: (94-108)/(52-74) 108/56 (04/24 0500) SpO2:  [92 %-97 %] 93 % (04/24 0845) Weight:  [127.4 kg] 127.4 kg (04/24 0500) Last BM Date: 10/23/18  Weight change: Filed Weights   10/22/18 0500 10/23/18 0449 10/24/18 0500  Weight: 130.9 kg 127.1 kg 127.4 kg    Intake/Output:   Intake/Output Summary (Last 24 hours) at 10/24/2018 0921 Last data filed at 10/24/2018 0356 Gross per 24 hour  Intake 1427 ml  Output 780 ml  Net 647 ml     Physical Exam: General: Elderly. No resp difficulty. HEENT: Normal Neck: Supple. JVP flat . Carotids 2+ bilat; no bruits. No thyromegaly or nodule noted. Cor: PMI nondisplaced. RRR, No M/G/R noted Lungs: bilateral fine crackles on R  Abdomen: obese, soft, non-tender, non-distended, no HSM. No bruits or masses. +BS  Extremities: No cyanosis, clubbing, or rash. R and LLE no edema.  Neuro: Alert & orientedx3, cranial nerves grossly intact. moves all 4 extremities w/o difficulty. Affect  pleasant  Telemetry: NSR 70-80s Personally reviewed   Labs: Basic Metabolic Panel: Recent Labs  Lab 10/19/18 0321 10/20/18 0325 10/21/18 0417 10/22/18 0500 10/23/18 0153 10/24/18 0442  NA 133* 135 133* 134* 134* 134*  K 3.7 3.1* 3.5 3.9 3.7 3.6  CL 98 104 94* 97* 98 96*  CO2 25 21* 25 25 22 25   GLUCOSE 132* 237* 165* 121* 160* 125*  BUN 29* 25* 30* 27* 25* 24*  CREATININE 1.44* 1.13 1.37* 1.22 1.31* 1.33*  CALCIUM 8.3* 7.0* 8.3* 8.5* 8.3* 8.8*  MG 2.3  --  2.0  --   --   --     Liver Function Tests: Recent Labs  Lab 10/23/18 0730  AST 28  ALT 31  ALKPHOS 72  BILITOT 1.2  PROT 6.8  ALBUMIN 2.9*   Recent Labs  Lab 10/23/18 0153  LIPASE 37   No results for input(s): AMMONIA in the last 168 hours.  CBC: Recent Labs  Lab 10/19/18 0321 10/20/18 0325 10/21/18 0417 10/22/18 0500 10/23/18 0153  WBC 15.6* 22.4* 17.0* 15.6* 17.8*  HGB 12.1* 12.1* 12.9* 13.0 13.1  HCT 38.6* 37.0* 39.2 39.5 39.5  MCV 93.9 92.7 92.0 92.3 92.5  PLT 267 356 327 330 341    Cardiac Enzymes: Recent Labs  Lab 10/23/18 0153 10/23/18 0730 10/23/18 1318  TROPONINI 2.92* 1.90* 1.75*    BNP: BNP (last 3 results) No results for input(s): BNP in the last 8760 hours.  ProBNP (last 3 results) No results for input(s): PROBNP in the last 8760 hours.    Other results:  Imaging: Dg Abd 1 View  Result Date: 10/23/2018 CLINICAL DATA:  Abdominal pain, hypoxia EXAM: ABDOMEN - 1 VIEW COMPARISON:  None. FINDINGS: Mild diffuse gaseous distention of bowel. No evidence of obstruction, organomegaly or free air. No suspicious calcification. Bibasilar atelectasis. IMPRESSION: Mild diffuse gaseous distention of bowel. No evidence for obstruction or free air. Electronically Signed   By: Charlett Nose M.D.   On: 10/23/2018 02:20   Ct Chest Wo Contrast  Result Date: 10/23/2018 CLINICAL DATA:  Pneumonia. EXAM: CT CHEST WITHOUT CONTRAST TECHNIQUE: Multidetector CT imaging of the chest was performed  following the standard protocol without IV contrast. COMPARISON:  Radiograph of same day.  CT scan of October 15, 2018. FINDINGS: Cardiovascular: Atherosclerosis of thoracic aorta is noted. 4.3 cm ascending thoracic aortic aneurysm is noted. Mild cardiomegaly is noted. No pericardial effusion is noted. Coronary artery calcifications are noted. Mediastinum/Nodes: No enlarged mediastinal or axillary lymph nodes. Thyroid gland, trachea, and esophagus demonstrate no significant findings. Lungs/Pleura: No pneumothorax is noted. Small minimal pleural effusions are noted bilaterally with adjacent subsegmental atelectasis. Increased interstitial opacities are noted in the right upper lobe concerning for atypical pneumonia. New multiple opacities are noted in the left upper lobe concerning for pneumonia. Upper Abdomen: No acute abnormality. Musculoskeletal: No chest wall mass or suspicious bone lesions identified. IMPRESSION: New large interstitial opacity is noted in the right upper lobe, as well as multiple smaller opacities are noted in the left upper lobe. These findings are concerning for multifocal interstitial or atypical pneumonia. Minimal bilateral pleural effusions are noted with adjacent subsegmental atelectasis. 4.3 cm ascending thoracic aortic aneurysm. Recommend annual imaging followup by CTA or MRA. This recommendation follows 2010 ACCF/AHA/AATS/ACR/ASA/SCA/SCAI/SIR/STS/SVM Guidelines for the Diagnosis and Management of Patients with Thoracic Aortic Disease. Circulation. 2010; 121: Z366-Y403. Aortic aneurysm NOS (ICD10-I71.9). Coronary artery calcifications are noted suggesting coronary artery disease. Aortic Atherosclerosis (ICD10-I70.0). Electronically Signed   By: Lupita Raider M.D.   On: 10/23/2018 13:26   Dg Chest Port 1 View  Result Date: 10/23/2018 CLINICAL DATA:  Abdominal pain, hypoxia EXAM: PORTABLE CHEST 1 VIEW COMPARISON:  10/20/2018 FINDINGS: Right PICC line remains in place, unchanged.  Cardiomegaly. Right greater than left airspace opacities are again noted. Some improvement on the left since prior study. There is also been improvement in the right lower lung lobe since prior study. Stable consolidation in the right upper lobe. No visible effusions or acute bony abnormality. IMPRESSION: Bilateral airspace opacities, right greater than left, improving since prior study. Cardiomegaly. Electronically Signed   By: Charlett Nose M.D.   On: 10/23/2018 02:21     Medications:     Scheduled Medications: . alum & mag hydroxide-simeth  30 mL Oral Once   And  . lidocaine  15 mL Oral Once  . amiodarone  200 mg Oral BID  . arformoterol  15 mcg Nebulization BID  . aspirin  81 mg Oral Daily  . atorvastatin  80 mg Oral q1800  . budesonide (PULMICORT) nebulizer solution  0.25 mg Nebulization BID  . carvedilol  3.125 mg Oral BID WC  . clonazePAM  1 mg Oral QHS  . FLUoxetine  60 mg Oral Daily  . furosemide  80 mg Oral Daily  . lamoTRIgine  75 mg Oral QHS  . losartan  12.5 mg Oral Daily  . pantoprazole  40 mg Oral Daily  . sodium chloride flush  10-40 mL Intracatheter Q12H  . spironolactone  12.5 mg Oral Daily  . tamsulosin  0.4 mg Oral QHS  . ticagrelor  90 mg Oral BID    Infusions:   PRN Medications: acetaminophen, albuterol, alum & mag hydroxide-simeth, morphine injection, ondansetron (ZOFRAN) IV, sodium chloride flush    Coronary Diagrams   Diagnostic  Dominance: Right    Intervention     Assessment:   Justin Mckendrick. is a 78 y.o. male with a hx of HTN, HLD, BPH and obesitywho was admitted with NSTEMI and found to have totally occluded LAD s/p DES on 4/16. Post MI care complicated by acute systolic HF with EF 30%   Plan/Discussion:     1.  CAD/NSTEMI: Peak trop > 65. Cath 4/16 with total occlusion of LAD -> s/p DES (55% residual stenosis due to heavy calcification).  Residual CAD with LCx 60% and RCA 20%. EF 30% with LVEDP 27.  - Doing well. No angina.  -  Continue DAPT and b-blocker  - Continue atorvastatin 80 daily.   - Troponin improving 2.9 to 1.90 to 1.75 (>65 on 4/16).  2. Acute systolic HF: Due to LAD infarct. Echo 10/16/18 EF 30-35%.  - Now on po diuretics. CVP 2-3 range. Nt much room to diurese any farther. Be careful not to overdiurese.  - Continue spironolactone 12.5 and Coreg 3.125.  - Continue losartan 12.5 mg daily.  Eventually transition to [facility] if BP tolerates. SBP 100-110s 3. Acute hypoxic respiratory failure: Has COPD but not on oxygen at home.  CXR with asymmetric diffuse right-sided airspace disease on 4/20.  Had some hemoptysis but this has resolved.  WBCs lower at 15.  Afebrile. ?Aspiration, ?viral pneumonitis.  COVID-19 negative x 2, viral panel negative, blood cultures NGTD. Oxygen saturation now better with diuresis. Sats 88-92% on RA. - Completed course of ABX for CAP. Tmax 100.0 yesterday morning. CBC ordered. - Chest CT 4/23 showed new large Insterstitial opacity in RUL and multiple smaller opacities in LUL, concerning for multifocial interstitial or atypical PNA. Also showed 4.3 cm ascending TAA - I have d/w results of CT with IMTS and suggested Pulmonary consultation. They ae seeing him now.  4.  COPD:  Recently evaluated by pulmonary medicine and is now treated for COPD with Breo and Ventolin. Not on home oxygen prior to admission.  - Management per IM/pulmonary. See CT above. 5. AKI: ? Shock vs CIN vs diuresis. Resolved. 6. Atrial fibrillation: First noted on 10/19/18, now back in NSR today on amiodarone.  He is also now on heparin gtt (+ ASA 81 and ticagrelor).  Given only brief atrial fibrillation and need for DAPT, would not commit him to triple therapy at this.   - Continue amiodarone (1 month if no further atrial fibrillation).  - No further AF. Very transient episode in setting of respiratory distress. Off heparin. Continue ticagrelor + ASA if no further afib.  7. Abdominal pain - Received sorbitol. Per primary  team.  8. 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 Recommending annual f/u with CTA or MRA.  Planning to transfer to CIR when ready. We will see again on Monday Please cal over the w/e with questions.   Length of Stay: 9  Arvilla Meres, MD  11:23 AM    Advanced Heart Failure Team Pager [phone] (M-F; 7a - 4p)  Please contact [facility] for night-coverage after hours (4p -7a ) and weekends on [email or web address]

## 2018-10-25 LAB — CBC
HCT: 37.8 % — ABNORMAL LOW (ref 39.0–52.0)
Hemoglobin: 12.4 g/dL — ABNORMAL LOW (ref 13.0–17.0)
MCH: 30 pg (ref 26.0–34.0)
MCHC: 32.8 g/dL (ref 30.0–36.0)
MCV: 91.3 fL (ref 80.0–100.0)
Platelets: 383 10*3/uL (ref 150–400)
RBC: 4.14 MIL/uL — ABNORMAL LOW (ref 4.22–5.81)
RDW: 13.2 % (ref 11.5–15.5)
WBC: 16.4 10*3/uL — ABNORMAL HIGH (ref 4.0–10.5)
nRBC: 0 % (ref 0.0–0.2)

## 2018-10-25 LAB — BASIC METABOLIC PANEL
Anion gap: 13 (ref 5–15)
BUN: 24 mg/dL — ABNORMAL HIGH (ref 8–23)
CO2: 20 mmol/L — ABNORMAL LOW (ref 22–32)
Calcium: 8.5 mg/dL — ABNORMAL LOW (ref 8.9–10.3)
Chloride: 99 mmol/L (ref 98–111)
Creatinine, Ser: 1.33 mg/dL — ABNORMAL HIGH (ref 0.61–1.24)
GFR calc Af Amer: 60 mL/min — ABNORMAL LOW (ref >60)
GFR calc non Af Amer: 52 mL/min — ABNORMAL LOW (ref >60)
Glucose, Bld: 205 mg/dL — ABNORMAL HIGH (ref 70–99)
Potassium: 3.6 mmol/L (ref 3.5–5.1)
Sodium: 132 mmol/L — ABNORMAL LOW (ref 135–145)

## 2018-10-25 LAB — COOXEMETRY PANEL
Carboxyhemoglobin: 1.7 % — ABNORMAL HIGH (ref 0.5–1.5)
Methemoglobin: 1.4 % (ref 0.0–1.5)
O2 Saturation: 63.9 %
Total hemoglobin: 13 g/dL (ref 12.0–16.0)

## 2018-10-25 MED ORDER — ALTEPLASE 2 MG IJ SOLR
2.0000 mg | Freq: Once | INTRAMUSCULAR | Status: AC
  Administered 2018-10-25: 2 mg
  Filled 2018-10-25: qty 2

## 2018-10-25 MED ORDER — FLUTICASONE FUROATE-VILANTEROL 200-25 MCG/INH IN AEPB
1.0000 | INHALATION_SPRAY | Freq: Every day | RESPIRATORY_TRACT | Status: DC
  Administered 2018-10-25 – 2018-10-27 (×3): 1 via RESPIRATORY_TRACT
  Filled 2018-10-25: qty 28

## 2018-10-25 NOTE — Progress Notes (Addendum)
Subjective: HD#10   Overnight: No acute events   Today, he reports difficulty sleeping due to sliding down in the bed. He was able to eat some fruit and chocolate milk for breakfast. His walk with PT yesterday was not as strong as before. He states that his legs are very weak. Discussed plan for continued PT and discharge to CIR vs SNF pending bed availability   Objective:  Vital signs in last 24 hours: Vitals:   10/24/18 1936 10/24/18 1938 10/24/18 2024 10/25/18 0004  BP:   100/62 96/63  Pulse:   77   Resp:   (!) 23   Temp:   97.8 F (36.6 C)   TempSrc:   Oral   SpO2: 97% 96% 95%   Weight:      Height:        Const: In NAD Resp: expiratory wheezes posteriorly, mild crackles at the bibasilar  CV: RRR, no MGR Abd: BS+, distension improved  Assessment/Plan:  Principal Problem:   Acute anterior myocardial infarction [facility]) Active Problems:   Chest pain   COPD exacerbation (HCC)   Atypical pneumonia   Elevated troponin   Acute systolic heart failure (HCC)   Status post coronary artery stent placement   Myocardial infarction involving left anterior descending (LAD) coronary artery (HCC)   Supplemental oxygen dependent   Essential hypertension   Steroid-induced hyperglycemia   Diabetes mellitus type 2 in obese (HCC)   Tobacco abuse   Leukocytosis   Hyponatremia   Acute hypoxemic respiratory failure (HCC)  Mr.Schlossberg is a 78 yo M w/ PMH of HTN, HLD, T2DM, BPH, obesitywhopresentedwith acute onsetchest painfound to have Acute anterior MI with 100% occlusion ofproximalLED s/p DES. Also here for management of Acute HFrEF, Hypoxic respiratory failure, CAP and A-Fibb.  #Acute anterior MI: Stable. No subsequent chest pain, shortness of breath. He was able to participate in physical therapy and walked 110 feet but reported on some weakness in his legs. He did not experience shortness of breath during the PT session. -Continue aspirin 81 mg daily, Brilinta -Continue  Lipitor 80 mg daily -ContinueCoreg -Appreciate cardiology recommendation  #AcuteHFrEF:His weight is stable at ~280 pounds (Weight on admission 294 lbs). Total I/O with net positive 250cc (UOP 850cc). Stable with no sign of volume oerload -ContinuePOLasix 80mg  mgdaily -Strict intake and output -ContinueCoreg, spironolactone and losartan  -Continue cardiac monitoring  #Acute onset abdominal pain-Resolved -Encourage ambulation -Continue to monitor  #New onset A. fib with RVR-resolved: In SR.  -Continue oral amiodarone -Continue DAPT -If he converts back to A-fibb, will transition to Eliquis + plavix  #Acute hypoxic respiratory failure -Resolved -s/p completion of antibiotics for CAP -Continue albuterol and Breo -If wheezing persists, will consider prednisone -Supplemental O2 prn -Per pulmonology:      -Repeat CT in 6-8 weeks to monitor for resolution     -Increase home Breo to 200-25 inhalation daily      -Add Spiriva 2.38mcg 2 puffs daily      -Continue Albuterol 2 puffs q4hrs prn sob, wheezing     -Obtain ambulatory O2 to determine home O2 need  #Acute kidney injury: Stable. sCr 1.3<<1.3<<1.2 -Continue to monitor  #Parkinsonian features: -Continue lamotrigine and fluoxetine  #COPD -Continue nebulizers withpulmicort, brovana  #Hypertension -BP stable  -ContinueCoreg, spironolactone and losartan   #Hypokalemia:Resolved  #Generalized anxiety disorder:  -Clonazepam 1 mg qhs  #Aortic Aneurysm: CT Chest revealed 4.3 cm ascending thoracic aortic aneurysm -Annual imaging followup by CTA or MRA  SFK:CLEXNTZ electrolytes as needed, heart  healthy diet VTE KGS:UPJSRPR CODE STATUS:Full code  Dispo: Anticipatedtransferto CIR or SNFpending insurance authorization  Yvette Rack, MD 10/25/2018, 6:45 AM Pager: [phone] IMTS PGY-1

## 2018-10-26 DIAGNOSIS — R21 Rash and other nonspecific skin eruption: Secondary | ICD-10-CM

## 2018-10-26 LAB — CBC
HCT: 39.9 % (ref 39.0–52.0)
Hemoglobin: 12.9 g/dL — ABNORMAL LOW (ref 13.0–17.0)
MCH: 30.1 pg (ref 26.0–34.0)
MCHC: 32.3 g/dL (ref 30.0–36.0)
MCV: 93 fL (ref 80.0–100.0)
Platelets: 411 10*3/uL — ABNORMAL HIGH (ref 150–400)
RBC: 4.29 MIL/uL (ref 4.22–5.81)
RDW: 13.2 % (ref 11.5–15.5)
WBC: 19.2 10*3/uL — ABNORMAL HIGH (ref 4.0–10.5)
nRBC: 0 % (ref 0.0–0.2)

## 2018-10-26 LAB — BASIC METABOLIC PANEL
Anion gap: 13 (ref 5–15)
BUN: 23 mg/dL (ref 8–23)
CO2: 22 mmol/L (ref 22–32)
Calcium: 8.7 mg/dL — ABNORMAL LOW (ref 8.9–10.3)
Chloride: 101 mmol/L (ref 98–111)
Creatinine, Ser: 1.59 mg/dL — ABNORMAL HIGH (ref 0.61–1.24)
GFR calc Af Amer: 48 mL/min — ABNORMAL LOW (ref >60)
GFR calc non Af Amer: 42 mL/min — ABNORMAL LOW (ref >60)
Glucose, Bld: 137 mg/dL — ABNORMAL HIGH (ref 70–99)
Potassium: 4.1 mmol/L (ref 3.5–5.1)
Sodium: 136 mmol/L (ref 135–145)

## 2018-10-26 LAB — COOXEMETRY PANEL
Carboxyhemoglobin: 1.7 % — ABNORMAL HIGH (ref 0.5–1.5)
Methemoglobin: 0.9 % (ref 0.0–1.5)
O2 Saturation: 64.8 %
Total hemoglobin: 13.4 g/dL (ref 12.0–16.0)

## 2018-10-26 MED ORDER — HYDROCORTISONE 1 % EX CREA
1.0000 "application " | TOPICAL_CREAM | Freq: Three times a day (TID) | CUTANEOUS | Status: DC | PRN
  Filled 2018-10-26: qty 28

## 2018-10-26 NOTE — Progress Notes (Addendum)
Subjective: HD#11   Overnight: No Acute events   Today, his breathing has improved. He notes new scrotal pain/swelling. PT did not come by yesterday. He was excited that he was able to urinate standing up.   Objective:  Vital signs in last 24 hours: Vitals:   10/25/18 1329 10/25/18 1745 10/25/18 1756 10/25/18 2010  BP: 105/61  (!) 102/58 (!) 102/45  Pulse: 76 81 78 (!) 51  Resp: (!) 21  (!) 23   Temp: 98.5 F (36.9 C)   97.7 F (36.5 C)  TempSrc: Oral   Oral  SpO2: 95%  96% 95%  Weight:      Height:       Const: In NAD, lying in bed HEENT:[place]/AT Resp: Persistent mild bibasilar rales R>L, wheezes improved CV: RRR, no MGR Abd: BS+, NTTP GU: Scrotal rash   Assessment/Plan:  Principal Problem:   Acute anterior myocardial infarction [facility]) Active Problems:   Chest pain   COPD exacerbation (HCC)   Atypical pneumonia   Elevated troponin   Acute systolic heart failure (HCC)   Status post coronary artery stent placement   Myocardial infarction involving left anterior descending (LAD) coronary artery (HCC)   Supplemental oxygen dependent   Essential hypertension   Steroid-induced hyperglycemia   Diabetes mellitus type 2 in obese (HCC)   Tobacco abuse   Leukocytosis   Hyponatremia   Acute hypoxemic respiratory failure (HCC)  Justin Bradford is a 78 yo M w/ PMH of HTN, HLD, T2DM, BPH, obesitywhopresentedwith acute onsetchest painfound to have Acute anterior MI with 100% occlusion ofproximalLED s/p DES. Also here for management of Acute HFrEF, Hypoxic respiratory failure, CAP and A-Fibb.  #Acute anterior MI: POD 10. Stable. No complication since procedure. He continues to be active with physical therapy. Vitals remain unremarkable. -Continue aspirin 81 mg daily, Brilinta -Continue Lipitor 80 mg daily -ContinueCoreg -Appreciate cardiology recommendation  #AcuteHFrEF:His weight continues to trend down this am 277lbs <<279lbs. Overall 14lbs down since admission.  His oxygen saturation has significantly improved on room air and objectively he looks dry on exam.  -ContinuePOLasix80mg mgdaily (Can consider decreasing lasix if Cr worsens) -Strict intake and output -ContinueCoreg, spironolactone and losartan  -Continue cardiac monitoring  #Acute onset abdominal pain-Resolved -Encourage ambulation -Continue to monitor  #New onset A. fib with RVR-resolved:In SR. -Continue oral amiodarone -Continue DAPT -If he converts back to A-fibb, will transition to Eliquis + plavix  #Acute hypoxic respiratory failure -Resolved -s/p completion of antibiotics for CAP -Continue albuterol and Breo -If wheezing persists, will consider prednisone -Supplemental O2 prn -Per pulmonology:      -Repeat CT in 6-8 weeks to monitor for resolution     -Increase home Breo to 200-25 inhalation daily      -Add Spiriva 2.8mcg 2 puffs daily      -Continue Albuterol 2 puffs q4hrs prn sob, wheezing     -Obtain ambulatory O2 to determine home O2 need  #Reactive leukocytosis: WBC have been consistently elevated without any signs of systemic infection. WBC this am 19.2. Could be secondary to recent MI or ?demargination of neutrophils from inhaled steroid use. -Will require outpatient workup if it persists.   #Acute kidney injury:Stable.sCr1.5<1.3<<1.3<1.2 -Continue to monitor  #Scrotal rash:  -Continue hydrocortisone cream   #Parkinsonian features: -Continue lamotrigine and fluoxetine  #COPD -Continue nebulizers withpulmicort, brovana  #Hypertension -BP stable  -ContinueCoreg, spironolactone and losartan   #Hypokalemia:Resolved  #Generalized anxiety disorder:  -Clonazepam 1 mg qhs  #Aortic Aneurysm:CT Chest revealed 4.3 cm ascending thoracic aortic aneurysm -Annual  imaging followup by CTA or MRA  OAC:ZYSAYTK electrolytes as needed, heart healthy diet VTE ppx:SQ Heparin CODE STATUS:Full code  Dispo: Anticipatedtransferto CIR or  SNFpending insurance authorization  Yvette Rack, MD 10/26/2018, 6:26 AM Pager: [phone] IMTS PGY-1

## 2018-10-26 NOTE — TOC Progression Note (Signed)
Transition of Care (TOC) - Progression Note    Patient Details  Name: Justin Bradford. MRN: [id number] Date of Birth: 03-16-1941  Transition of Care [facility]) CM/SW Contact  [name],  Phone Number: [phone] 10/26/2018, 2:38 PM  Clinical Narrative:     Clinical Social Worker met with patient at bedside to offer support and discuss patient needs at discharge.  Patient states that he remains hopeful for placement at Inpatient Rehab, however was accepting of available SNF bed offers.  Patient is from [facility], and states that he would be agreeable to referral sent to inpatient rehab in [facility] if bed availability is the only issue.  CSW to update RNCM and facilitate patient discharge needs once medically stable.  Expected Discharge Plan: [place] Barriers to Discharge: No Barriers Identified  Expected Discharge Plan and Services Expected Discharge Plan: [place] In-house Referral: NA Discharge Planning Services: NA Post Acute Care Choice: [place] Living arrangements for the past 2 months: Single Family Home                 DME Arranged: N/A DME Agency: NA       HH Arranged: NA HH Agency: NA         Social Determinants of Health (SDOH) Interventions    Readmission Risk Interventions No flowsheet data found.

## 2018-10-26 NOTE — Progress Notes (Signed)
  Date: 10/26/2018  Patient name: Justin Bradford.  Medical record number: [id number]  Date of birth: [birth year]   I have seen and evaluated this patient and I have discussed the plan of care with the house staff. Please see Dr. Verdell Face note for complete details. I concur with his findings and plan.    Inez Catalina, MD 10/26/2018, 3:10 PM

## 2018-10-27 ENCOUNTER — Other Ambulatory Visit: Payer: Self-pay

## 2018-10-27 ENCOUNTER — Other Ambulatory Visit: Payer: Self-pay | Admitting: Internal Medicine

## 2018-10-27 ENCOUNTER — Inpatient Hospital Stay (HOSPITAL_COMMUNITY)
Admission: RE | Admit: 2018-10-27 | Discharge: 2018-11-07 | DRG: 945 | Disposition: A | Discharge status: Home Health | Payer: Medicare Other | Source: Intra-hospital | Attending: Physical Medicine & Rehabilitation | Admitting: Physical Medicine & Rehabilitation

## 2018-10-27 DIAGNOSIS — E119 Type 2 diabetes mellitus without complications: Secondary | ICD-10-CM | POA: Diagnosis present

## 2018-10-27 DIAGNOSIS — N400 Enlarged prostate without lower urinary tract symptoms: Secondary | ICD-10-CM | POA: Diagnosis present

## 2018-10-27 DIAGNOSIS — Z955 Presence of coronary angioplasty implant and graft: Secondary | ICD-10-CM

## 2018-10-27 DIAGNOSIS — R5381 Other malaise: Secondary | ICD-10-CM | POA: Diagnosis present

## 2018-10-27 DIAGNOSIS — Z7951 Long term (current) use of inhaled steroids: Secondary | ICD-10-CM | POA: Diagnosis not present

## 2018-10-27 DIAGNOSIS — E785 Hyperlipidemia, unspecified: Secondary | ICD-10-CM | POA: Diagnosis present

## 2018-10-27 DIAGNOSIS — D72828 Other elevated white blood cell count: Secondary | ICD-10-CM | POA: Diagnosis present

## 2018-10-27 DIAGNOSIS — Z7984 Long term (current) use of oral hypoglycemic drugs: Secondary | ICD-10-CM

## 2018-10-27 DIAGNOSIS — I952 Hypotension due to drugs: Secondary | ICD-10-CM | POA: Diagnosis not present

## 2018-10-27 DIAGNOSIS — J449 Chronic obstructive pulmonary disease, unspecified: Secondary | ICD-10-CM | POA: Diagnosis present

## 2018-10-27 DIAGNOSIS — Z87891 Personal history of nicotine dependence: Secondary | ICD-10-CM

## 2018-10-27 DIAGNOSIS — J9691 Respiratory failure, unspecified with hypoxia: Secondary | ICD-10-CM | POA: Diagnosis present

## 2018-10-27 DIAGNOSIS — R070 Pain in throat: Secondary | ICD-10-CM | POA: Diagnosis not present

## 2018-10-27 DIAGNOSIS — I4891 Unspecified atrial fibrillation: Secondary | ICD-10-CM | POA: Diagnosis present

## 2018-10-27 DIAGNOSIS — I5021 Acute systolic (congestive) heart failure: Secondary | ICD-10-CM | POA: Diagnosis present

## 2018-10-27 DIAGNOSIS — E1169 Type 2 diabetes mellitus with other specified complication: Secondary | ICD-10-CM | POA: Diagnosis not present

## 2018-10-27 DIAGNOSIS — N179 Acute kidney failure, unspecified: Secondary | ICD-10-CM | POA: Diagnosis not present

## 2018-10-27 DIAGNOSIS — Z7982 Long term (current) use of aspirin: Secondary | ICD-10-CM

## 2018-10-27 DIAGNOSIS — I5022 Chronic systolic (congestive) heart failure: Secondary | ICD-10-CM

## 2018-10-27 DIAGNOSIS — I2510 Atherosclerotic heart disease of native coronary artery without angina pectoris: Secondary | ICD-10-CM | POA: Diagnosis present

## 2018-10-27 DIAGNOSIS — I110 Hypertensive heart disease with heart failure: Secondary | ICD-10-CM | POA: Diagnosis present

## 2018-10-27 DIAGNOSIS — D72829 Elevated white blood cell count, unspecified: Secondary | ICD-10-CM | POA: Diagnosis not present

## 2018-10-27 DIAGNOSIS — I214 Non-ST elevation (NSTEMI) myocardial infarction: Secondary | ICD-10-CM | POA: Diagnosis present

## 2018-10-27 DIAGNOSIS — I480 Paroxysmal atrial fibrillation: Secondary | ICD-10-CM

## 2018-10-27 LAB — BASIC METABOLIC PANEL
Anion gap: 9 (ref 5–15)
BUN: 23 mg/dL (ref 8–23)
CO2: 24 mmol/L (ref 22–32)
Calcium: 8.6 mg/dL — ABNORMAL LOW (ref 8.9–10.3)
Chloride: 102 mmol/L (ref 98–111)
Creatinine, Ser: 1.44 mg/dL — ABNORMAL HIGH (ref 0.61–1.24)
GFR calc Af Amer: 54 mL/min — ABNORMAL LOW (ref >60)
GFR calc non Af Amer: 47 mL/min — ABNORMAL LOW (ref >60)
Glucose, Bld: 125 mg/dL — ABNORMAL HIGH (ref 70–99)
Potassium: 4.7 mmol/L (ref 3.5–5.1)
Sodium: 135 mmol/L (ref 135–145)

## 2018-10-27 LAB — GLUCOSE, CAPILLARY
Glucose-Capillary: 159 mg/dL — ABNORMAL HIGH (ref 70–99)
Glucose-Capillary: 139 mg/dL — ABNORMAL HIGH (ref 70–99)

## 2018-10-27 MED ORDER — SPIRONOLACTONE 25 MG PO TABS: 12.5000 mg | ORAL_TABLET | Freq: Every day | ORAL | 0 refills | Status: DC

## 2018-10-27 MED ORDER — TICAGRELOR 90 MG PO TABS
90.0000 mg | ORAL_TABLET | Freq: Two times a day (BID) | ORAL | Status: DC
  Administered 2018-10-27 – 2018-11-07 (×22): 90 mg via ORAL
  Filled 2018-10-27 (×22): qty 1

## 2018-10-27 MED ORDER — ALTEPLASE 2 MG IJ SOLR
2.0000 mg | Freq: Once | INTRAMUSCULAR | Status: AC
  Administered 2018-10-27: 2 mg
  Filled 2018-10-27: qty 2

## 2018-10-27 MED ORDER — CARVEDILOL 3.125 MG PO TABS: 3.1250 mg | ORAL_TABLET | Freq: Two times a day (BID) | ORAL | 0 refills | Status: DC

## 2018-10-27 MED ORDER — AMIODARONE HCL 200 MG PO TABS
200.0000 mg | ORAL_TABLET | Freq: Every day | ORAL | Status: DC
  Administered 2018-10-27: 200 mg via ORAL

## 2018-10-27 MED ORDER — SORBITOL 70 % SOLN: 30.0000 mL | Freq: Every day | Status: DC | PRN

## 2018-10-27 MED ORDER — SODIUM CHLORIDE 0.9% FLUSH: 10.0000 mL | Freq: Two times a day (BID) | INTRAVENOUS | Status: DC

## 2018-10-27 MED ORDER — AMIODARONE HCL 200 MG PO TABS: 200.0000 mg | ORAL_TABLET | Freq: Every day | ORAL | 0 refills | Status: DC

## 2018-10-27 MED ORDER — HYDROCORTISONE 1 % EX CREA: 1.0000 "application " | TOPICAL_CREAM | Freq: Three times a day (TID) | CUTANEOUS | 0 refills | Status: DC | PRN

## 2018-10-27 MED ORDER — CLONAZEPAM 0.5 MG PO TABS
1.0000 mg | ORAL_TABLET | Freq: Every day | ORAL | Status: DC
  Administered 2018-10-27 – 2018-11-06 (×11): 1 mg via ORAL
  Filled 2018-10-27 (×11): qty 2

## 2018-10-27 MED ORDER — SPIRONOLACTONE 12.5 MG HALF TABLET
12.5000 mg | ORAL_TABLET | Freq: Every day | ORAL | Status: DC
  Administered 2018-10-28 – 2018-11-07 (×11): 12.5 mg via ORAL
  Filled 2018-10-27 (×11): qty 1

## 2018-10-27 MED ORDER — AMIODARONE HCL 200 MG PO TABS
200.0000 mg | ORAL_TABLET | Freq: Every day | ORAL | Status: DC
  Administered 2018-10-28 – 2018-11-07 (×11): 200 mg via ORAL
  Filled 2018-10-27 (×11): qty 1

## 2018-10-27 MED ORDER — SODIUM CHLORIDE 0.9% FLUSH: 10.0000 mL | INTRAVENOUS | Status: DC | PRN

## 2018-10-27 MED ORDER — ENOXAPARIN SODIUM 40 MG/0.4ML ~~LOC~~ SOLN
40.0000 mg | SUBCUTANEOUS | Status: DC
  Administered 2018-10-28 – 2018-11-07 (×11): 40 mg via SUBCUTANEOUS
  Filled 2018-10-27 (×11): qty 0.4

## 2018-10-27 MED ORDER — FUROSEMIDE 80 MG PO TABS: 80.0000 mg | ORAL_TABLET | Freq: Every day | ORAL | 0 refills | Status: DC

## 2018-10-27 MED ORDER — POTASSIUM CHLORIDE CRYS ER 20 MEQ PO TBCR: 40.0000 meq | EXTENDED_RELEASE_TABLET | Freq: Every day | ORAL | 0 refills | Status: DC

## 2018-10-27 MED ORDER — ACETAMINOPHEN 325 MG PO TABS
650.0000 mg | ORAL_TABLET | ORAL | Status: DC | PRN
  Administered 2018-10-27: 22:00:00 650 mg via ORAL
  Filled 2018-10-27: qty 2

## 2018-10-27 MED ORDER — CLONAZEPAM 1 MG PO TABS: 1.0000 mg | ORAL_TABLET | Freq: Every day | ORAL | 0 refills | Status: DC

## 2018-10-27 MED ORDER — ALUM & MAG HYDROXIDE-SIMETH 200-200-20 MG/5ML PO SUSP: 30.0000 mL | ORAL | Status: DC | PRN

## 2018-10-27 MED ORDER — HYDROCORTISONE 1 % EX CREA: 1.0000 "application " | TOPICAL_CREAM | Freq: Three times a day (TID) | CUTANEOUS | Status: DC | PRN

## 2018-10-27 MED ORDER — FLUTICASONE FUROATE-VILANTEROL 200-25 MCG/INH IN AEPB
1.0000 | INHALATION_SPRAY | Freq: Every day | RESPIRATORY_TRACT | Status: DC
  Administered 2018-10-28 – 2018-11-07 (×11): 1 via RESPIRATORY_TRACT
  Filled 2018-10-27: qty 28

## 2018-10-27 MED ORDER — LOSARTAN POTASSIUM 25 MG PO TABS: 12.5000 mg | ORAL_TABLET | Freq: Every day | ORAL | 0 refills | Status: DC

## 2018-10-27 MED ORDER — FUROSEMIDE 40 MG PO TABS
80.0000 mg | ORAL_TABLET | Freq: Every day | ORAL | Status: DC
  Administered 2018-10-28 – 2018-11-07 (×11): 80 mg via ORAL
  Filled 2018-10-27 (×11): qty 2

## 2018-10-27 MED ORDER — LOSARTAN POTASSIUM 25 MG PO TABS
12.5000 mg | ORAL_TABLET | Freq: Every day | ORAL | Status: DC
  Administered 2018-10-28 – 2018-11-07 (×10): 12.5 mg via ORAL
  Filled 2018-10-27 (×11): qty 0.5

## 2018-10-27 MED ORDER — POTASSIUM CHLORIDE CRYS ER 20 MEQ PO TBCR
40.0000 meq | EXTENDED_RELEASE_TABLET | Freq: Every day | ORAL | Status: DC
  Administered 2018-10-28 – 2018-11-07 (×11): 40 meq via ORAL
  Filled 2018-10-27 (×11): qty 2

## 2018-10-27 MED ORDER — TICAGRELOR 90 MG PO TABS: 90.0000 mg | ORAL_TABLET | Freq: Two times a day (BID) | ORAL | Status: DC

## 2018-10-27 MED ORDER — TAMSULOSIN HCL 0.4 MG PO CAPS
0.4000 mg | ORAL_CAPSULE | Freq: Every day | ORAL | Status: DC
  Administered 2018-10-27 – 2018-11-06 (×11): 0.4 mg via ORAL
  Filled 2018-10-27 (×11): qty 1

## 2018-10-27 MED ORDER — ATORVASTATIN CALCIUM 80 MG PO TABS
80.0000 mg | ORAL_TABLET | Freq: Every day | ORAL | Status: DC
  Administered 2018-10-27 – 2018-11-06 (×11): 80 mg via ORAL
  Filled 2018-10-27 (×11): qty 1

## 2018-10-27 MED ORDER — PANTOPRAZOLE SODIUM 40 MG PO TBEC
40.0000 mg | DELAYED_RELEASE_TABLET | Freq: Every day | ORAL | Status: DC
  Administered 2018-10-28 – 2018-11-07 (×11): 40 mg via ORAL
  Filled 2018-10-27 (×11): qty 1

## 2018-10-27 MED ORDER — FLUOXETINE HCL 20 MG PO CAPS: 60.0000 mg | ORAL_CAPSULE | Freq: Every day | ORAL | 0 refills | Status: DC

## 2018-10-27 MED ORDER — ALBUTEROL SULFATE (2.5 MG/3ML) 0.083% IN NEBU: 2.5000 mg | INHALATION_SOLUTION | RESPIRATORY_TRACT | Status: DC | PRN

## 2018-10-27 MED ORDER — ENOXAPARIN SODIUM 40 MG/0.4ML ~~LOC~~ SOLN: 40.0000 mg | SUBCUTANEOUS | Status: DC

## 2018-10-27 MED ORDER — ALBUTEROL SULFATE (2.5 MG/3ML) 0.083% IN NEBU: 2.5000 mg | INHALATION_SOLUTION | RESPIRATORY_TRACT | 12 refills | Status: DC | PRN

## 2018-10-27 MED ORDER — CARVEDILOL 3.125 MG PO TABS
3.1250 mg | ORAL_TABLET | Freq: Two times a day (BID) | ORAL | Status: DC
  Administered 2018-10-27 – 2018-11-07 (×22): 3.125 mg via ORAL
  Filled 2018-10-27 (×22): qty 1

## 2018-10-27 MED ORDER — FLUOXETINE HCL 20 MG PO CAPS
60.0000 mg | ORAL_CAPSULE | Freq: Every day | ORAL | Status: DC
  Administered 2018-10-28 – 2018-11-07 (×11): 60 mg via ORAL
  Filled 2018-10-27 (×11): qty 3

## 2018-10-27 MED ORDER — LAMOTRIGINE 25 MG PO TABS
75.0000 mg | ORAL_TABLET | Freq: Every day | ORAL | Status: DC
  Administered 2018-10-27 – 2018-11-06 (×11): 75 mg via ORAL
  Filled 2018-10-27 (×11): qty 3

## 2018-10-27 MED ORDER — FLUTICASONE FUROATE-VILANTEROL 200-25 MCG/INH IN AEPB: 1.0000 | INHALATION_SPRAY | Freq: Every day | RESPIRATORY_TRACT | 0 refills | Status: DC

## 2018-10-27 MED ORDER — ASPIRIN 81 MG PO CHEW
81.0000 mg | CHEWABLE_TABLET | Freq: Every day | ORAL | Status: DC
  Administered 2018-10-28 – 2018-11-07 (×11): 81 mg via ORAL
  Filled 2018-10-27 (×11): qty 1

## 2018-10-27 MED ORDER — PANTOPRAZOLE SODIUM 40 MG PO TBEC: 40.0000 mg | DELAYED_RELEASE_TABLET | Freq: Every day | ORAL | 0 refills | Status: DC

## 2018-10-27 MED ORDER — ATORVASTATIN CALCIUM 80 MG PO TABS: 80.0000 mg | ORAL_TABLET | Freq: Every day | ORAL | 0 refills | Status: DC

## 2018-10-27 NOTE — Progress Notes (Signed)
CSW called and spoke with Luther Parody at inpatient rehab. Britta Mccreedy is off today and Luther Parody is following the patient's case. CIR team is working on prioritizing patient's for CIR beds. Luther Parody said they have about 4 beds available and 10-12 patients on the waitlist. Luther Parody stated that she would call the CSW back if they are able to make a bed offer of the patient. CSW expressed that the patient is interested in using in-patient rehab at [facility].   CSW called [facility] in-patient rehab and spoke with rehab coordinator Tiburcio Pea. They have 4 beds available today and are accepting new referrals. CSW sent over patient H&P, Therapy Notes, and MD progression notes. Pam stated that she would look at the referral and let the CSW if they are able to make a bed offer.   CSW is awaiting acceptance from both in-patient rehab programs. CSW will continue to follow and assist with disposition.   Drucilla Schmidt, MSW, LCSW-A Clinical Social Worker [employer]

## 2018-10-27 NOTE — H&P (Signed)
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 One level home with ramped entrance. He has 2 sisters reportedly live out of town. 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 Presented 10/15/2018 with chest pain and shortness of breath. Workup revealed troponin 0.05 trending up to 12.54, lactic acid 1.7, potassium 3.4 and WBC 16,600-24,600 felt to be reactive without any signs of systemic infection . Chest x-ray COPD without acute airspace disease.viral panel negative. Blood cultures no growth to date. CT angiogram of the chest no evidence of pulmonary emboli. EKG showed mild ST elevation in the anterior leads suspect non-STEMI. Echocardiogram with ejection fraction 35% moderate to severe reduced systolic function. Cardiac catheterization completed 10/16/2018 by Dr. Burt Knack with total occlusion LAD and underwent stent placement. Required norepinephrine for blood pressure control as well as IV Lasix for diuresis for acute systolic congestive heart failure. Bouts of atrial fibrillation requiring amiodarone.Maintained on IV heparin transitioned to Brilinta as well as low-dose aspirin with subcutaneous Lovenox. Hospital course compromised by hypoxic respiratory failure with right greater than left pulmonary infiltrates suspect CAP coverage 5 day total antibiotics. Patient still in need of oxygen therapy needing as much as 6 L oxygen to keep  saturations greater than 90% with ambulation as well as nebulizer treatments. Tolerating a regular diet. Therapy evaluations completed and patient was admitted for a comprehensive rehabilitation program.   Review of Systems  Constitutional: Positive for malaise/fatigue. Negative for chills and fever.  HENT: Negative for hearing loss.   Eyes: Negative for blurred vision and double vision.  Respiratory: Positive for shortness of breath. Negative for cough.   Cardiovascular: Positive for palpitations and leg swelling. Negative for chest pain.  Gastrointestinal: Positive for constipation. Negative for heartburn, nausea and vomiting.  Genitourinary: Positive for urgency. Negative for dysuria, flank pain and hematuria.  Musculoskeletal: Positive for myalgias.  Skin: Negative for rash.  Neurological: Positive for weakness.  All other systems reviewed and are negative.       Past Medical History:  Diagnosis Date   Hypertension     Peripheral neuropathy           Past Surgical History:  Procedure Laterality Date   CORONARY STENT INTERVENTION N/A 10/16/2018    Procedure: CORONARY STENT INTERVENTION;  Surgeon: Troy Sine, MD;  Location: [facility];  Service: Cardiovascular;  Laterality: N/A;   KNEE SURGERY       LEFT HEART CATH AND CORONARY ANGIOGRAPHY N/A 10/16/2018    Procedure: LEFT HEART CATH AND CORONARY ANGIOGRAPHY;  Surgeon: Troy Sine, MD;  Location: [facility];  Service: Cardiovascular;  Laterality: N/A;    History reviewed. No pertinent family history. Social History:  reports that he has quit smoking. He has never used smokeless tobacco. He reports current alcohol use. He reports that he does not use drugs. Allergies: No Known Allergies       Medications Prior to Admission  Medication Sig Dispense Refill   aspirin EC 81 MG tablet Take 81 mg by mouth daily.       BREO ELLIPTA 100-25 MCG/INH AEPB Inhale 1 puff into the lungs daily.       FLUoxetine  (PROZAC) 20 MG capsule Take 60 mg by mouth every morning.       hydrochlorothiazide (HYDRODIURIL) 50 MG tablet Take 50 mg by mouth every morning.        lamoTRIgine (LAMICTAL) 25 MG tablet Take 75 mg by mouth at bedtime.       losartan (COZAAR) 25 MG tablet Take 50 mg by mouth every morning.       metFORMIN (GLUCOPHAGE) 500 MG tablet Take 250 mg by mouth every morning.        omeprazole (PRILOSEC) 20 MG capsule Take 20 mg by mouth every morning.       simvastatin (ZOCOR) 40 MG tablet Take 40 mg by mouth every morning.        spironolactone (ALDACTONE) 25 MG tablet Take 25 mg by mouth 2 (two) times daily.        tamsulosin (FLOMAX) 0.4 MG CAPS capsule Take 0.4 mg by mouth at bedtime.        VENTOLIN HFA 108 (90 Base) MCG/ACT inhaler Inhale 1 puff into the lungs every 4 (four) hours as needed for wheezing.       metoprolol succinate (TOPROL-XL) 50 MG 24 hr tablet Take 50 mg by mouth daily.          Drug Regimen Review Drug regimen was reviewed and remains appropriate with no significant issues identified   Home: Home Living Family/patient expects to be discharged to:: Private residence Living Arrangements: Alone Available Help at Discharge: Family, Available PRN/intermittently Type of Home: House Home Access: Ramped entrance Home Layout: One level Bathroom Shower/Tub: Curtain, Chiropodist: Standard Bathroom Accessibility: Yes Home Equipment: Toilet riser, Grab bars - tub/shower, Hand held shower head  Lives With: Alone   Functional History: Prior Function Level of Independence: Independent Comments: 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     Functional Status:  Mobility: Bed Mobility Overal bed mobility: Needs Assistance Bed Mobility: Supine to Sit Supine to sit: Min assist General bed mobility comments: pulled up on therapist's hand, use of rail, HOB up 20 degrees Transfers Overall transfer level: Needs assistance Equipment  used: Rolling walker (2 wheeled) Transfers: Sit to/from Stand Sit to Stand: Min guard General transfer comment: cues for hand placement, increased time and effort Ambulation/Gait Ambulation/Gait assistance: Min guard Gait Distance (Feet): 110 Feet Assistive device: Rolling walker (2 wheeled) Gait Pattern/deviations: Step-through pattern, Decreased stride length, Trunk flexed General Gait Details: Pt with cues for posture and position in RW. Pt fatigued with gait and unable to tolerate further gait Gait velocity interpretation: >2.62 ft/sec, indicative of community ambulatory   ADL: ADL Overall ADL's : Needs assistance/impaired Grooming: Min guard, Standing, Oral care Upper Body Bathing: Minimal assistance, Sitting Lower Body Bathing: Moderate assistance, Sit to/from stand Upper Body Dressing : Minimal assistance, Sitting Lower Body Dressing: Moderate assistance, Sit to/from stand Lower Body Dressing Details (indicate cue type and reason): able to figure 4 with R LE but not L LE  Toilet Transfer: Moderate assistance, Ambulation, +2 for safety/equipment Toilet Transfer Details (indicate cue type and reason): simulated to/from recliner  Toileting- Clothing Manipulation and Hygiene: Moderate assistance, Sit to/from stand Functional mobility during ADLs: Minimal assistance, Rolling walker General ADL Comments: pt appears SOB  with talking, but Sp02 remained in mid 90s   Cognition: Cognition Overall Cognitive Status: Within Functional Limits for tasks assessed Orientation Level: Oriented X4 Cognition Arousal/Alertness: Awake/alert Behavior During Therapy: WFL for tasks assessed/performed Overall Cognitive Status: Within Functional Limits for tasks assessed   Physical Exam: Blood pressure 94/62, pulse 88, temperature 98.7 F (37.1 C), temperature source Oral, resp. rate (!) 23, height 6' (1.829 m), weight 125.9 kg, SpO2 94 %. Physical Exam  Constitutional: He appears well-developed  and well-nourished. No distress.  HENT:  Head: Normocephalic and atraumatic.  Eyes: Pupils are equal, round, and reactive to light. EOM are normal.  Neck: No tracheal deviation present. No thyromegaly present.  Cardiovascular: Normal rate and regular rhythm. Exam reveals no friction rub.  No murmur heard. Respiratory: Effort normal. No respiratory distress. He has no wheezes.  Decreased breath sounds at bases  GI: Soft. He exhibits no distension. There is no abdominal tenderness.  Musculoskeletal: Normal range of motion.        General: No edema.  Neurological:  Patient is alert sitting at the edge of the bed. Provides his name, age and date of birth. Follows full commands. Fair insight and awareness of deficits. Normal language, functional memory, insight and awareness. UE 4/5 prox to distal. LE: 3+HF, 4- KE and 4/5 ADF/PF. No sensory findings  Skin: Skin is warm and dry.  Psychiatric:  Pleasant, cooperative, verbose      Lab Results Last 48 Hours        Results for orders placed or performed during the hospital encounter of 10/15/18 (from the past 48 hour(s))  Basic metabolic panel     Status: Abnormal    Collection Time: 10/25/18  8:18 AM  Result Value Ref Range    Sodium 132 (L) 135 - 145 mmol/L    Potassium 3.6 3.5 - 5.1 mmol/L    Chloride 99 98 - 111 mmol/L    CO2 20 (L) 22 - 32 mmol/L    Glucose, Bld 205 (H) 70 - 99 mg/dL    BUN 24 (H) 8 - 23 mg/dL    Creatinine, Ser 1.33 (H) 0.61 - 1.24 mg/dL    Calcium 8.5 (L) 8.9 - 10.3 mg/dL    GFR calc non Af Amer 52 (L) >60 mL/min    GFR calc Af Amer 60 (L) >60 mL/min    Anion gap 13 5 - 15      Comment: Performed at [facility], [address], [place], [place] [address]  CBC     Status: Abnormal    Collection Time: 10/25/18  8:18 AM  Result Value Ref Range    WBC 16.4 (H) 4.0 - 10.5 K/uL    RBC 4.14 (L) 4.22 - 5.81 MIL/uL    Hemoglobin 12.4 (L) 13.0 - 17.0 g/dL    HCT 37.8 (L) 39.0 - 52.0 %    MCV 91.3 80.0 - 100.0 fL     MCH 30.0 26.0 - 34.0 pg    MCHC 32.8 30.0 - 36.0 g/dL    RDW 13.2 11.5 - 15.5 %    Platelets 383 150 - 400 K/uL    nRBC 0.0 0.0 - 0.2 %      Comment: Performed at [facility], [place] [address], [place], [place] [address]  .Cooxemetry Panel (carboxy, met, total hgb, O2 sat)     Status: Abnormal    Collection Time: 10/26/18  4:55 AM  Result Value Ref Range    Total hemoglobin  13.4 12.0 - 16.0 g/dL    O2 Saturation 64.8 %    Carboxyhemoglobin 1.7 (H) 0.5 - 1.5 %    Methemoglobin 0.9 0.0 - 1.5 %  Basic metabolic panel     Status: Abnormal    Collection Time: 10/26/18  5:00 AM  Result Value Ref Range    Sodium 136 135 - 145 mmol/L    Potassium 4.1 3.5 - 5.1 mmol/L    Chloride 101 98 - 111 mmol/L    CO2 22 22 - 32 mmol/L    Glucose, Bld 137 (H) 70 - 99 mg/dL    BUN 23 8 - 23 mg/dL    Creatinine, Ser 1.59 (H) 0.61 - 1.24 mg/dL    Calcium 8.7 (L) 8.9 - 10.3 mg/dL    GFR calc non Af Amer 42 (L) >60 mL/min    GFR calc Af Amer 48 (L) >60 mL/min    Anion gap 13 5 - 15      Comment: Performed at [facility] [address] [place], [place] [address]  CBC     Status: Abnormal    Collection Time: 10/26/18  5:00 AM  Result Value Ref Range    WBC 19.2 (H) 4.0 - 10.5 K/uL    RBC 4.29 4.22 - 5.81 MIL/uL    Hemoglobin 12.9 (L) 13.0 - 17.0 g/dL    HCT 39.9 39.0 - 52.0 %    MCV 93.0 80.0 - 100.0 fL    MCH 30.1 26.0 - 34.0 pg    MCHC 32.3 30.0 - 36.0 g/dL    RDW 13.2 11.5 - 15.5 %    Platelets 411 (H) 150 - 400 K/uL    nRBC 0.0 0.0 - 0.2 %      Comment: Performed at [facility], [place] [address] [place], [place] [address]      Imaging Results (Last 48 hours)  No results found.           Medical Problem List and Plan: 1.   Debility secondary to CAD/non-STEMI status post cardiac catheterization with stenting complicated by hypoxic respiratory failure/CAP/COPD             -admit to inpatient rehab 2.  Antithrombotics: -DVT/anticoagulation:  Lovenox               -antiplatelet therapy: aspirin 81 mg daily,Brilinta 90 mg twice a day 3. Pain Management:  Tylenol as needed 4. Mood: Klonopin 1 mg daily at bedtime, Prozac 60 mg daily,Lamictal 75 mg daily             -antipsychotic agents: N/A 5. Neuropsych: This patient is capable of making decisions on his own behalf. 6. Skin/Wound Care:  Routine skin checks 7. Fluids/Electrolytes/Nutrition:  Routine in and out's with follow-up chemistries 8. Atrial fibrillation.Amiodarone 200 mg twice a day 1 month. Cardiac rate controlled 9. Hypertension. Coreg 3.125 mg twice a day, Aldactone 12.5 mg daily, Cozaar 12.5 mg daily 10. Acute systolic congestive heart failure. Lasix 80 mg daily.             - Monitor for any signs of fluid overload.              -daily weights              -Follow-up as outpt with cardiology services 11.AKI. Resolved.              -follow-up chemistries on admit 12. Diabetes mellitus. Blood sugar checks currently discontinued. Patient was  on Glucophage 250 mg daily prior to admission. 13. Reactive leukocytosis. WBC  consistently elevated without signs of systemic infection. Could be secondary to recent MI. May require workup as an outpatient 14. COPD/CAP with tobacco abuse. Counseling. Wean of oxygen. Complete course of Zithromax and Rocephin 15. Hyperlipidemia. Lipitor 16. BPH. Flomax     Post Admission Physician Evaluation: 1. Functional deficits secondary  to debility after NSTEMI, CAP/respiratory failure. 2. Patient is admitted to receive collaborative, interdisciplinary care between the physiatrist, rehab nursing staff, and therapy team. 3. Patient's level of medical complexity and substantial therapy needs in context of that medical necessity cannot be provided at a lesser intensity of care such as a SNF. 4. Patient has experienced substantial functional loss from his/her baseline which was documented above under the "Functional History" and "Functional Status" headings.  Judging by  the patient's diagnosis, physical exam, and functional history, the patient has potential for functional progress which will result in measurable gains while on inpatient rehab.  These gains will be of substantial and practical use upon discharge  in facilitating mobility and self-care at the household level. 5. Physiatrist will provide 24 hour management of medical needs as well as oversight of the therapy plan/treatment and provide guidance as appropriate regarding the interaction of the two. 6. The Preadmission Screening has been reviewed and patient status is unchanged unless otherwise stated above. 7. 24 hour rehab nursing will assist with bladder management, bowel management, safety, skin/wound care, disease management, medication administration, pain management and patient education  and help integrate therapy concepts, techniques,education, etc. 8. PT will assess and treat for/with: Lower extremity strength, range of motion, stamina, balance, functional mobility, safety, adaptive techniques and equipment, NMR, activity tolerance, community reintegration.   Goals are: mod I to supervision. 9. OT will assess and treat for/with: ADL's, functional mobility, safety, upper extremity strength, adaptive techniques and equipment, NMR, community reentry, family ed.   Goals are: mod I to supervision. Therapy may proceed with showering this patient. 10. SLP will assess and treat for/with: cognition, communication.  Goals are: mod I to supervision. 11. Case Management and Social Worker will assess and treat for psychological issues and discharge planning. 12. Team conference will be held weekly to assess progress toward goals and to determine barriers to discharge. 13. Patient will receive at least 3 hours of therapy per day at least 5 days per week. 14. ELOS: 10-12 days       15. Prognosis:  excellent   I have personally performed a face to face diagnostic evaluation of this patient and formulated the key  components of the plan.  Additionally, I have personally reviewed laboratory data, imaging studies, as well as relevant notes and concur with the physician assistant's documentation above.  Meredith Staggers, MD, FAAPMR      Lavon Paganini [place], PA-C 10/27/2018

## 2018-10-27 NOTE — Progress Notes (Signed)
Inpatient Rehab Admissions Coordinator:   I have a bed available and approval from pt's attending for admission to CIR today.  I will alert pt/family, and RN CM.    Estill Dooms, PT, DPT Admissions Coordinator [phone] 10/27/18  11:36 AM

## 2018-10-27 NOTE — Progress Notes (Signed)
  Date: 10/27/2018  Patient name: Justin Bradford.  Medical record number: [id number]  Date of birth: June 08, 1941   I have seen and evaluated this patient and I have discussed the plan of care with the house staff. Please see Dr. Murrell Redden note for complete details. I concur with her findings and plan.  Discharge to CIR today.     Inez Catalina, MD 10/27/2018, 4:23 PM

## 2018-10-27 NOTE — Evaluation (Signed)
Occupational Therapy Assessment and Plan  Patient Details  Name: Justin Bradford. MRN: [id number] Date of Birth: August 21, 1940  OT Diagnosis: muscle weakness (generalized) and decreased activity tolerance Rehab Potential: Rehab Potential (ACUTE ONLY): Excellent ELOS: 7-9 days   Today's Date: 10/28/2018 OT Individual Time: 1300-1415 OT Individual Time Calculation (min): 75 min     Problem List:  Patient Active Problem List   Diagnosis Date Noted  . Debility 10/27/2018  . Acute hypoxemic respiratory failure ([place])   . Myocardial infarction involving left anterior descending (LAD) coronary artery ([place]) 10/21/2018  . Supplemental oxygen dependent   . Essential hypertension   . Steroid-induced hyperglycemia   . Diabetes mellitus type 2 in obese ([place])   . Tobacco abuse   . Leukocytosis   . Hyponatremia   . Status post coronary artery stent placement   . Acute systolic heart failure ([place])   . Acute anterior myocardial infarction ([place])   . Atypical pneumonia   . Elevated troponin   . Chest pain 10/15/2018  . COPD exacerbation ([place]) 10/15/2018  . Left wrist injury 03/22/2016  . Bilateral primary osteoarthritis of knee 03/15/2015    Past Medical History:  Past Medical History:  Diagnosis Date  . Hypertension   . Peripheral neuropathy    Past Surgical History:  Past Surgical History:  Procedure Laterality Date  . CORONARY STENT INTERVENTION N/A 10/16/2018   Procedure: CORONARY STENT INTERVENTION;  Surgeon: Troy Sine, MD;  Location: [facility];  Service: Cardiovascular;  Laterality: N/A;  . KNEE SURGERY    . LEFT HEART CATH AND CORONARY ANGIOGRAPHY N/A 10/16/2018   Procedure: LEFT HEART CATH AND CORONARY ANGIOGRAPHY;  Surgeon: Troy Sine, MD;  Location: [facility];  Service: Cardiovascular;  Laterality: N/A;    Assessment & Plan Clinical Impression: Patient is a 78 y.o. year old male with recent admission to the hospital on4/15/2020 with chest pain and  shortness of breath.  Work-up revealed troponin 0.05, lactic acid 1.7, potassium 3.4, WBC 16,600-24,600. Chest x-ray COPD without acute airspace disease. CT angiogram of the chest no evidence of pulmonary emboli. ECG showing mild ST elevation elevation in anterior leads.  Echocardiogram with ejection fraction of 35% moderate to severe reduced systolic function. Cardiac catheterization 10/16/2018 with total occlusion LAD. Required norepinephrine for blood pressure control IV Lasix for diuresis. Maintained on IV heparin transitioning to Brilinta. Hospital course compromised by hypoxic respiratory failure with right greater than left pulmonary infiltrates.  Patient transferred to CIR on 10/27/2018 .    Patient currently requires mod with basic self-care skills secondary to muscle weakness, decreased cardiorespiratoy endurance and decreased standing balance and decreased postural control.  Prior to hospitalization, patient could complete BADL with independent .  Patient will benefit from skilled intervention to increase independence with basic self-care skills prior to discharge.  Anticipate patient will require intermittent supervision and follow up home health.  OT - End of Session Endurance Deficit: Yes Endurance Deficit Description: rest breaks within BADL tasks OT Assessment Rehab Potential (ACUTE ONLY): Excellent OT Barriers to Discharge: Decreased caregiver support OT Barriers to Discharge Comments: Pt only has friends who can stop in intermittently  OT Patient demonstrates impairments in the following area(s): Balance;Endurance;Motor OT Basic ADL's Functional Problem(s): Grooming;Bathing;Dressing;Toileting OT Advanced ADL's Functional Problem(s): Simple Meal Preparation OT Transfers Functional Problem(s): Toilet;Tub/Shower OT Additional Impairment(s): None OT Plan OT Intensity: Minimum of 1-2 x/day, 45 to 90 minutes OT Frequency: 5 out of 7 days OT Duration/Estimated Length of  Stay: 7-9  days OT Treatment/Interventions: Balance/vestibular training;Community reintegration;Discharge planning;DME/adaptive equipment instruction;Functional mobility training;Patient/family education;Psychosocial support;Self Care/advanced ADL retraining;Therapeutic Activities;Therapeutic Exercise;UE/LE Strength taining/ROM;UE/LE Coordination activities OT Basic Self-Care Anticipated Outcome(s): Mod I OT Toileting Anticipated Outcome(s): Mod I OT Bathroom Transfers Anticipated Outcome(s): Mod I OT Recommendation Recommendations for Other Services: Therapeutic Recreation consult Therapeutic Recreation Interventions: Other (comment)(Pt active and independent PTA) Patient destination: Home Follow Up Recommendations: Home health OT Equipment Recommended: To be determined Skilled Therapeutic Intervention   Pt greeted semi-reclined in bed and agreeable to OT evaluation and treatment session. OT eval completed addressing rehab process, OT purpose, POC, ELOS, and goals. Pt came to sitting EOB with min A. Stand-pivot to wc with increased time, no AD, and Min A. Pt needed rest break s/p transfer. OT covered PICC line and IV sites with water proof dressing. Pt needed assistance to doff socks, then completed stand-pivot into shower with increased time, min A, and use of grab bars. Bathing completed with assistance to wash LEs, and CGA for balance when standing to wash buttocks. Pt needed extended rest break seated on tub bench prior to transferring out of shower. Pt with increased SOB and overall fatigue s/p shower. Pt needed mod A to thread pant legs and min A for UB dressing 2/2 fatigue. Pt requests to return to bed at end of session. Min A to bring LEs back into bed. Pt left semi-reclined in bed with bed alarm on and needs met.   OT Evaluation Precautions/Restrictions  Precautions Precautions: Fall Restrictions Weight Bearing Restrictions: No Pain Pain Assessment Pain Scale: 0-10 Pain Score: 0-No pain Home  Living/Prior Functioning Home Living Family/patient expects to be discharged to:: Private residence Living Arrangements: Alone Available Help at Discharge: Family, Available PRN/intermittently Type of Home: House Home Access: Ramped entrance Home Layout: One level Bathroom Shower/Tub: Curtain, Chiropodist: Standard Bathroom Accessibility: Yes Additional Comments: Pt states he may have tub transfer bench but he is not sure if it is still in the garage  Lives With: Alone Prior Function Level of Independence: Independent with basic ADLs, Independent with homemaking with ambulation Driving: Yes Vocation: Volunteer work Leisure: Hobbies-yes (Comment) Comments: 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  used to [employer], enjoys music ADL ADL Eating: Independent Grooming: Supervision/safety Upper Body Bathing: Minimal assistance Lower Body Bathing: Moderate assistance Upper Body Dressing: Minimal assistance Lower Body Dressing: Moderate assistance Toileting: Minimal assistance Toilet Transfer: Minimal assistance Tub/Shower Transfer: Minimal assistance Vision Baseline Vision/History: Wears glasses Vision Assessment?: No apparent visual deficits Perception  Perception: Within Functional Limits Cognition Overall Cognitive Status: Within Functional Limits for tasks assessed Arousal/Alertness: Awake/alert Orientation Level: Person;Place;Situation Person: Oriented Place: Oriented Situation: Oriented Year: 2020 Month: April Day of Week: Correct Memory: Appears intact Immediate Memory Recall: Sock;Blue;Bed Memory Recall: Blue;Sock;Bed Memory Recall Sock: Without Cue Memory Recall Blue: Without Cue Memory Recall Bed: Without Cue Attention: Focused;Sustained Focused Attention: Appears intact Sustained Attention: Appears intact Awareness: Appears intact Safety/Judgment: Appears intact Sensation Sensation Light Touch: Impaired by gross  assessment(history of peripheral neuropathy affect LE sensation) Proprioception: Appears Intact Coordination Gross Motor Movements are Fluid and Coordinated: No Fine Motor Movements are Fluid and Coordinated: Yes Coordination and Movement Description: impaired secondary to weakness Motor  Motor Motor: Within Functional Limits Motor - Skilled Clinical Observations: generalized weakness Mobility  Bed Mobility Supine to Sit: Contact Guard/Touching assist Sit to Supine: Contact Guard/Touching assist Transfers Sit to Stand: Minimal Assistance - Patient > 75% Stand to Sit: Minimal Assistance -  Patient > 75%  Trunk/Postural Assessment  Cervical Assessment Cervical Assessment: Exceptions to WFL(forward head posture) Thoracic Assessment Thoracic Assessment: Exceptions to WFL(rounded shoulders) Lumbar Assessment Lumbar Assessment: Exceptions to WFL(posterior pelvic tilt) Postural Control Postural Control: Within Functional Limits  Balance Balance Balance Assessed: Yes Static Sitting Balance Static Sitting - Level of Assistance: 5: Stand by assistance Dynamic Sitting Balance Dynamic Sitting - Level of Assistance: 5: Stand by assistance Static Standing Balance Static Standing - Level of Assistance: 4: Min assist Dynamic Standing Balance Dynamic Standing - Level of Assistance: 4: Min assist Extremity/Trunk Assessment RUE Assessment RUE Assessment: Exceptions to [facility] General Strength Comments: 3+/5 generalized weakness LUE Assessment LUE Assessment: Exceptions to [facility] General Strength Comments: 3+/5 generalized weakness    Refer to Care Plan for Long Term Goals  Recommendations for other services: Therapeutic Recreation  Other Pt very active and independent PTA   Discharge Criteria: Patient will be discharged from OT if patient refuses treatment 3 consecutive times without medical reason, if treatment goals not met, if there is a change in medical status, if patient makes no  progress towards goals or if patient is discharged from hospital.  The above assessment, treatment plan, treatment alternatives and goals were discussed and mutually agreed upon: by patient  Valma Cava 10/28/2018, 3:55 PM

## 2018-10-27 NOTE — Progress Notes (Signed)
Occupational Therapy Treatment Patient Details Name: Justin Bradford. MRN: [id number] DOB: 19-Sep-1940 Today's Date: 10/27/2018    History of present illness 78yo male who presented with PMH of COPD, HTN, HLD, BPH, T2DM who presented with chest pain and shortness of breath with positive NSTEMI and was treated for LAD occlusion with DES stent, HF exacerbation and CAP without improvement in dyspnea or hypoxia.   OT comments  Pt progressing toward goals.  Pt less SOB today.  Performed grooming and feeding tasks EOB.  Anticipates d/c to CIR later today.  Will continue to follow acutely should pt remain in hospital.   Follow Up Recommendations  CIR    Equipment Recommendations  Other (comment)(defer to next venue)    Recommendations for Other Services      Precautions / Restrictions Precautions Precautions: Fall       Mobility Bed Mobility Overal bed mobility: Needs Assistance Bed Mobility: Supine to Sit;Sit to Supine     Supine to sit: Min assist;HOB elevated Sit to supine: Min assist;HOB elevated   General bed mobility comments: Assist to elevate trunk OOB. Use of bed rails.   Transfers                      Balance Overall balance assessment: Needs assistance Sitting-balance support: No upper extremity supported;Feet supported Sitting balance-[name]: Good                                     ADL either performed or assessed with clinical judgement   ADL Overall ADL's : Needs assistance/impaired Eating/Feeding: Independent;Sitting   Grooming: Wash/dry hands;Oral care;Supervision/safety;Sitting                                 General ADL Comments: Pt declining OOB mobility but agreeable to sitting up EOB.      Vision       Perception     Praxis      Cognition Arousal/Alertness: Awake/alert Behavior During Therapy: WFL for tasks assessed/performed Overall Cognitive Status: Within Functional Limits for tasks  assessed                                          Exercises     Shoulder Instructions       General Comments      Pertinent Vitals/ Pain       Pain Assessment: No/denies pain  Home Living                                          Prior Functioning/Environment              Frequency  Min 2X/week        Progress Toward Goals  OT Goals(current goals can now be found in the care plan section)  Progress towards OT goals: Progressing toward goals  Acute Rehab OT Goals Patient Stated Goal: to go home OT Goal Formulation: With patient Time For Goal Achievement: 11/04/18 Potential to Achieve Goals: Good ADL Goals Pt Will Perform Grooming: with modified independence;standing Pt Will Perform Lower Body Dressing: with modified independence;sit to/from stand Pt Will Transfer  to Toilet: with modified independence;ambulating;bedside commode Pt Will Perform Tub/Shower Transfer: Tub transfer;3 in 1;ambulating;with modified independence;rolling walker Pt/caregiver will Perform Home Exercise Program: Increased strength;Both right and left upper extremity;With written HEP provided;With theraband Additional ADL Goal #1: Pt will verbalize 3 energy conservation technique to utilize during daily routine in order to optimize independnece and safety.  Plan Discharge plan remains appropriate    Co-evaluation                 AM-PAC OT "6 Clicks" Daily Activity     Outcome Measure   Help from another person eating meals?: None Help from another person taking care of personal grooming?: None(seated) Help from another person toileting, which includes using toliet, bedpan, or urinal?: A Little Help from another person bathing (including washing, rinsing, drying)?: A Lot Help from another person to put on and taking off regular upper body clothing?: A Little Help from another person to put on and taking off regular lower body clothing?: A Lot 6  Click Score: 18    End of Session    OT Visit Diagnosis: Other abnormalities of gait and mobility (R26.89);Muscle weakness (generalized) (M62.81)   Activity Tolerance Patient tolerated treatment well   Patient Left in bed;with call bell/phone within reach   Nurse Communication Mobility status        Time: 1350-1410 OT Time Calculation (min): 20 min  Charges: OT General Charges $OT Visit: 1 Visit OT Treatments $Self Care/Home Management : 8-22 mins     Cipriano Mile OTR/L 10/27/2018, 2:18 PM

## 2018-10-27 NOTE — Progress Notes (Signed)
Subjective:  States he is feeling well this morning and is breathing well. The inhalers he is using have been helping his breathing. He does endorse weakness. He states he is ready to become active again and looks forward to going to rehab. He states he is having normal bowel movements again.   Objective:  Vital signs in last 24 hours: Vitals:   10/26/18 1714 10/26/18 2022 10/27/18 0026 10/27/18 0425  BP:  99/69 96/68 94/62   Pulse: 80 87 89 88  Resp:   (!) 26 (!) 23  Temp:  98.5 F (36.9 C)  98.7 F (37.1 C)  TempSrc:  Oral  Oral  SpO2:  97% 92% 94%  Weight:    125.9 kg  Height:       Constitution: awake, alert, lying comfortably in bed in NAD  Cardio: RRR; no murmurs, rubs or gallops Respiratory: normal work of breathing on room air; mild bibasilar crackles Abdominal: BS+; abdomen is soft, non-distended, non-tender MSK: no edema Neuro: A&Ox3. Cranial nerves intact. Strength is 5/5 in bilateral upper and lower extremities. Sensation intact throughout.    Assessment/Plan:  Principal Problem:   Acute anterior myocardial infarction [facility]) Active Problems:   Chest pain   COPD exacerbation ([facility])   Atypical pneumonia   Elevated troponin   Acute systolic heart failure ([facility])   Status post coronary artery stent placement   Myocardial infarction involving left anterior descending (LAD) coronary artery ([facility])   Supplemental oxygen dependent   Essential hypertension   Steroid-induced hyperglycemia   Diabetes mellitus type 2 in obese ([facility])   Tobacco abuse   Leukocytosis   Hyponatremia   Acute hypoxemic respiratory failure ([facility])  Justin Bradford is a 78 yo M w/ PMH of HTN, HLD, T2DM, BPH, obesitywhopresentedwith acute onsetchest painfound to have Acute anterior MI with 100% occlusion ofproximalLED s/p DES. Also here for management of Acute HFrEF, Hypoxic respiratory failure, CAP and A-Fibb.  #Acute anterior MI s/p DES on 4/16: Stable. No complication since procedure. He  continues to be active with physical therapy. Vitals remain unremarkable. No further chest pain. -Continue aspirin 81 mg daily, Brilinta -Continue Lipitor 80 mg daily -ContinueCoreg -Appreciate cardiology following   #AcuteHFrEF: - euvolemic on exam and is saturating well on room air -ContinuePOLasix80mg mg and Spironolactone 12.5 mg daily; will adjust as needed based on repeat BMP today -ContinueCoreg and losartan  -stable for CIR whenever bed is available   #New onset A. fib with RVR-resolved:In SR. -Continue oral amiodaronefor total of 1 month  -Continue DAPT  #Acute hypoxic respiratory failure-Resolved - likely combination of acute HFrEF in setting of MI and viral pneumonia based on CT findings  - significantly improved and is saturating well on room air -s/p completion of antibiotics for CAP - appreciate Pulm recommendations regarding bronchodilators; will follow-up outpatient for repeat CT in 6-8 weeks   #Reactive leukocytosis: WBC have been consistently elevated without any signs of systemic infection. Could be secondary to recent MI or ?demargination of neutrophils from inhaled steroid use. -Will require outpatient workup if it persists.   #Acute kidney injury: - slight bump in creatinine yesterday - repeating BMP today and will adjust PO diuretics if needed   #Scrotal rash:  -Continue hydrocortisone cream   #Parkinsonian features: -Continue lamotrigine and fluoxetine  #COPD -Continue nebulizers withpulmicort, brovana  #Hypertension -BP stable  -ContinueCoreg, spironolactone and losartan   #Hypokalemia:Resolved - continue oral supplementation   #Generalized anxiety disorder:  -Clonazepam 1 mg qhs  #Aortic Aneurysm:CT Chest revealed 4.3 cm ascending  thoracic aortic aneurysm -Annual imaging followup by CTA or MRA  Dispo: Anticipated discharge to CIR when bed available.   Justin Chancellor D, DO 10/27/2018, 6:40 AM Pager:  [phone]

## 2018-10-27 NOTE — Progress Notes (Signed)
Patient ID: Justin Posey., male   DOB: [birth year], 78 y.o.   MRN: [id number]    Advanced Heart Failure Rounding Note   Subjective:    Remains in NSR on PO amio. He is noted in vitals as being in atrial fibrillation, but looking at telemetry, only NSR seen.   Co-ox 65%. No labs this morning.  He is afebrile.    No complaints, no dyspnea.   Chest CT 4/23: new large Insterstitial opacity in RUL and multiple smaller opacities in LUL, concerning for multifocial interstitial or atypical PNA. 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   Objective:   Weight Range:  Vital Signs:   Temp:  [97.6 F (36.4 C)-98.7 F (37.1 C)] 98.7 F (37.1 C) (04/27 0425) Pulse Rate:  [73-89] 85 (04/27 0809) Resp:  [19-26] 22 (04/27 0809) BP: (94-101)/(60-76) 101/65 (04/27 0809) SpO2:  [92 %-97 %] 92 % (04/27 0809) Weight:  [125.9 kg] 125.9 kg (04/27 0425) Last BM Date: 10/26/18  Weight change: Filed Weights   10/25/18 0651 10/26/18 0701 10/27/18 0425  Weight: 126.8 kg 126 kg 125.9 kg    Intake/Output:   Intake/Output Summary (Last 24 hours) at 10/27/2018 1749 Last data filed at 10/26/2018 2045 Gross per 24 hour  Intake 222 ml  Output 1000 ml  Net -778 ml     Physical Exam: General: NAD Neck: No JVD, no thyromegaly or thyroid nodule.  Lungs: Mild crackles dependently.  CV: Nondisplaced PMI.  Heart regular S1/S2, no S3/S4, no murmur.  No peripheral edema.   Abdomen: Soft, nontender, no hepatosplenomegaly, no distention.  Skin: Intact without lesions or rashes.  Neurologic: Alert and oriented x 3.  Psych: Normal affect. Extremities: No clubbing or cyanosis.  HEENT: Normal.    Telemetry: NSR 80s Personally reviewed   Labs: Basic Metabolic Panel: Recent Labs  Lab 10/21/18 0417 10/22/18 0500 10/23/18 0153 10/24/18 0442 10/25/18 0818 10/26/18 0500  NA 133* 134* 134* 134* 132* 136  K 3.5 3.9 3.7 3.6 3.6 4.1  CL 94* 97*  98 96* 99 101  CO2 25 25 22 25  20* 22  GLUCOSE 165* 121* 160* 125* 205* 137*  BUN 30* 27* 25* 24* 24* 23  CREATININE 1.37* 1.22 1.31* 1.33* 1.33* 1.59*  CALCIUM 8.3* 8.5* 8.3* 8.8* 8.5* 8.7*  MG 2.0  --   --   --   --   --     Liver Function Tests: Recent Labs  Lab 10/23/18 0730  AST 28  ALT 31  ALKPHOS 72  BILITOT 1.2  PROT 6.8  ALBUMIN 2.9*   Recent Labs  Lab 10/23/18 0153  LIPASE 37   No results for input(s): AMMONIA in the last 168 hours.  CBC: Recent Labs  Lab 10/22/18 0500 10/23/18 0153 10/24/18 0923 10/25/18 0818 10/26/18 0500  WBC 15.6* 17.8* 18.7* 16.4* 19.2*  HGB 13.0 13.1 12.7* 12.4* 12.9*  HCT 39.5 39.5 39.4 37.8* 39.9  MCV 92.3 92.5 92.7 91.3 93.0  PLT 330 341 363 383 411*    Cardiac Enzymes: Recent Labs  Lab 10/23/18 0153 10/23/18 0730 10/23/18 1318  TROPONINI 2.92* 1.90* 1.75*    BNP: BNP (last 3 results) No results for input(s): BNP in the last 8760 hours.  ProBNP (last 3 results) No results for input(s): PROBNP in the last 8760 hours.    Other results:  Imaging: No results found.   Medications:     Scheduled Medications: .  alum & mag hydroxide-simeth  30 mL Oral Once   And  . lidocaine  15 mL Oral Once  . amiodarone  200 mg Oral BID  . aspirin  81 mg Oral Daily  . atorvastatin  80 mg Oral q1800  . carvedilol  3.125 mg Oral BID WC  . clonazePAM  1 mg Oral QHS  . enoxaparin (LOVENOX) injection  40 mg Subcutaneous Q24H  . FLUoxetine  60 mg Oral Daily  . fluticasone furoate-vilanterol  1 puff Inhalation Daily  . furosemide  80 mg Oral Daily  . lamoTRIgine  75 mg Oral QHS  . losartan  12.5 mg Oral Daily  . pantoprazole  40 mg Oral Daily  . potassium chloride  40 mEq Oral Daily  . sodium chloride flush  10-40 mL Intracatheter Q12H  . spironolactone  12.5 mg Oral Daily  . tamsulosin  0.4 mg Oral QHS  . ticagrelor  90 mg Oral BID    Infusions:   PRN Medications: acetaminophen, albuterol, alum & mag  hydroxide-simeth, hydrocortisone cream, morphine injection, ondansetron (ZOFRAN) IV, sodium chloride flush    Coronary Diagrams   Diagnostic  Dominance: Right    Intervention     Assessment:   Justin Capan. is a 78 y.o. male with a hx of HTN, HLD, BPH and obesitywho was admitted with NSTEMI and found to have totally occluded LAD s/p DES on 4/16. Post MI care complicated by acute systolic HF with EF 30%   Plan/Discussion:     1.  CAD/NSTEMI: Peak trop > 65. Cath 4/16 with total occlusion of LAD -> s/p DES (55% residual stenosis due to heavy calcification).  Residual CAD with LCx 60% and RCA 20%. EF 30% with LVEDP 27. No chest pain. - Continue DAPT and b-blocker  - Continue atorvastatin 80 daily.   2. Acute systolic HF: Due to LAD infarct. Echo 10/16/18 EF 30-35%. Now on po diuretics. He is not volume overloaded on exam, now on po Lasix. SBP 90s-100s, does not look like he has much room to titrate meds today.  - Continue Lasix 80 mg daily, but given rise in creatinine to 1.59 yesterday, would repeat BMET today.  - Continue spironolactone 12.5 and Coreg 3.125.  - Continue losartan 12.5 mg daily.  Eventually transition to [facility] if BP tolerates. SBP 100-110s 3. Acute hypoxic respiratory failure: Has COPD but not on oxygen at home.  CXR with asymmetric diffuse right-sided airspace disease on 4/20.  Had some hemoptysis but this has resolved.  WBCs lower at 15.  Afebrile. ?Aspiration, ?viral pneumonitis.  COVID-19 negative x 2, viral panel negative, blood cultures NGTD. Oxygen saturation now better with diuresis.  Completed course of ABX for CAP.  Chest CT 4/23 showed new large Insterstitial opacity in RUL and multiple smaller opacities in LUL, concerning for multifocial interstitial or atypical PNA. 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  Suspect that PNA played a significant role in his presentation and hypoxemia.  He is now off oxygen and antibiotics.  4.  COPD:  Recently evaluated by  pulmonary medicine and is now treated for COPD with Breo and Ventolin. Not on home oxygen prior to admission.  - Management per IM/pulmonary. See CT above. 5. AKI: ? Shock vs CIN vs diuresis. Creatinine mildly higher yesterday at 1.59, repeat BMET today.  6. Atrial fibrillation: First noted on 10/19/18, now back in NSR today on amiodarone.  He is also now on heparin gtt (+ ASA 81 and ticagrelor).  Given only  brief atrial fibrillation and need for DAPT, would not commit him to triple therapy at this.   - Continue amiodarone (1 month if no further atrial fibrillation).  - No further AF. Very transient episode in setting of respiratory distress. Off heparin. Continue ticagrelor + ASA if no further afib.  7. 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 Recommending annual f/u with CTA or MRA.  Seems ready for CIR.  Cardiology will sign off.  As long as creatinine stable today, cardiac meds for home are as follows: Lasix 80 mg daily, Coreg 3.125 mg bid, spironolactone 12.5 daily, losartan 12.5 daily, amiodarone 200 mg daily for total of 1 month then stop, ASA 81, ticagrelor 90 bid, atorvastatin 80, KCl 40 daily.   Length of Stay: 12  Justin Anconaalton Aviah Sorci, MD  9:22 AM    Advanced Heart Failure Team Pager [phone] (M-F; 7a - 4p)  Please contact [facility] for night-coverage after hours (4p -7a ) and weekends on [email or web address]

## 2018-10-27 NOTE — Progress Notes (Signed)
Stephania Fragmin, PT  Rehab Admission Coordinator  Physical Medicine and Rehabilitation  PMR Pre-admission  Signed  Date of Service:  10/24/2018 4:02 PM       Related encounter: ED to Hosp-Admission (Discharged) from 10/15/2018 in [place] [facility]      Signed         Show:Clear all Manual[x] Template[x] Copied  Added by: Standley Brooking, RN[x] Stephania Fragmin, PT  Hover for details PMR Admission Coordinator Pre-Admission Assessment  Patient: Justin Bradford. is an 78 y.o., male MRN: [id number] DOB: Sep 30, 1940 Height: 6' (182.9 cm) Weight: 125.9 kg                                                                                                                                                  Insurance Information HMO:     PPO:      PCP:      IPA:      80/20:      OTHER: no HMO PRIMARY: Medicare a and b      Policy#: [id number]      Subscriber: pt Benefits:  Phone #: passport one online     Name: 10/21/2018 Eff. Date: 06/02/2007     Deduct: $1408      Out of Pocket Max: none      Life Max: none CIR: 100%      SNF: 20 full days Outpatient: 80%     Co-Pay: 20% Home Health: 100%      Co-Pay: none DME: 80%     Co-Pay: 20% Providers: pt choice  SECONDARY: none/[employer] policy inactive       Medicaid Application Date:       Case Manager:  Disability Application Date:       Case Worker:   The "Data Collection Information Summary" for patients in Inpatient Rehabilitation Facilities with attached "Privacy Act Statement-Health Care Records" was provided and verbally reviewed with: Patient  Emergency Contact Information         Contact Information    Name Relation Home Work [place] Sister   [phone]   Shawnie Dapper [phone]  [phone]     Current Medical History  Patient Admitting Diagnosis: Debility  History of Present Illness:Justin Bradford is a 78 year old right handed male with history of  hyperlipidemia,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 Presented 10/15/2018 with chest pain and shortness of breath. Workup revealed troponin 0.05trending up to 12.54, lactic acid 1.7, potassium 3.4 and WBC 16,600-24,600. Chest x-ray COPD without acute airspace disease.viral panel negative. Blood cultures no growth to date. CT angiogram of the chest no evidence of pulmonary emboli.  EKG showed mild ST elevation in the anterior leads suspect non-STEMI. Echocardiogram with ejection fraction 35% moderate to severe reduced systolic function. Cardiac catheterization completed 10/16/2018 by Dr. Excell Seltzer with total occlusion LADand underwent stent placement. Required norepinephrine for blood pressure control as well as IV Lasix for diuresis for acute systolic congestive heart failure. Bouts of atrial fibrillation requiring amiodarone.Maintainedon IV heparin transitioned to Brilinta as well as low-dose aspirin with subcutaneous Lovenox. Hospital course compromised by hypoxic respiratory failure with right greater than left pulmonary infiltrates suspect CAP coverage 5 day total antibiotics. Patient still in need of oxygen therapy needing as much as 6 L oxygen to keep saturations greater than 90% with ambulation as well as nebulizer treatments. Tolerating a regular diet.    Past Medical History      Past Medical History:  Diagnosis Date  . Hypertension   . Peripheral neuropathy     Family History  family history is not on file.  Prior Rehab/Hospitalizations:  Has the patient had prior rehab or hospitalizations prior to admission? Yes  Has the patient had major surgery during 100 days prior to admission? No  Current Medications   Current Facility-Administered Medications:  .   acetaminophen (TYLENOL) tablet 650 mg, 650 mg, Oral, Q4H PRN, Lennette Bihari, MD, 650 mg at 10/18/18 0231 .  albuterol (PROVENTIL) (2.5 MG/3ML) 0.083% nebulizer solution 2.5 mg, 2.5 mg, Nebulization, Q4H PRN, [place], Carley D, DO, 2.5 mg at 10/22/18 0750 .  alum & mag hydroxide-simeth (MAALOX/MYLANTA) 200-200-20 MG/5ML suspension 30 mL, 30 mL, Oral, Once **AND** lidocaine (XYLOCAINE) 2 % viscous mouth solution 15 mL, 15 mL, Oral, Once, Theotis Barrio, MD .  alum & mag hydroxide-simeth (MAALOX/MYLANTA) 200-200-20 MG/5ML suspension 30 mL, 30 mL, Oral, Q4H PRN, Burns Spain, MD, 30 mL at 10/23/18 0032 .  amiodarone (PACERONE) tablet 200 mg, 200 mg, Oral, Daily, Laurey Morale, MD, 200 mg at 10/27/18 0930 .  aspirin chewable tablet 81 mg, 81 mg, Oral, Daily, Lennette Bihari, MD, 81 mg at 10/27/18 0930 .  atorvastatin (LIPITOR) tablet 80 mg, 80 mg, Oral, q1800, Lennette Bihari, MD, 80 mg at 10/26/18 1714 .  carvedilol (COREG) tablet 3.125 mg, 3.125 mg, Oral, BID WC, Laurey Morale, MD, 3.125 mg at 10/27/18 0930 .  clonazePAM (KLONOPIN) tablet 1 mg, 1 mg, Oral, QHS, Agyei, Obed K, MD, 1 mg at 10/26/18 2122 .  enoxaparin (LOVENOX) injection 40 mg, 40 mg, Subcutaneous, Q24H, Bensimhon, Bevelyn Buckles, MD, 40 mg at 10/26/18 0488 .  FLUoxetine (PROZAC) capsule 60 mg, 60 mg, Oral, Daily, Theotis Barrio, MD, 60 mg at 10/27/18 8916 .  fluticasone furoate-vilanterol (BREO ELLIPTA) 200-25 MCG/INH 1 puff, 1 puff, Inhalation, Daily, Agyei, Obed K, MD, 1 puff at 10/27/18 0728 .  furosemide (LASIX) tablet 80 mg, 80 mg, Oral, Daily, Bensimhon, Bevelyn Buckles, MD, 80 mg at 10/27/18 0930 .  hydrocortisone cream 1 % 1 application, 1 application, Topical, TID PRN, Burns Spain, MD .  lamoTRIgine (LAMICTAL) tablet 75 mg, 75 mg, Oral, QHS, Theotis Barrio, MD, 75 mg at 10/26/18 2122 .  losartan (COZAAR) tablet 12.5 mg, 12.5 mg, Oral, Daily, Laurey Morale, MD, 12.5 mg at 10/27/18 9450 .  morphine 2 MG/ML injection  2-4 mg, 2-4 mg, Intravenous, Q1H PRN, Eulah Pont, MD, 4 mg at 10/23/18 0209 .  ondansetron (ZOFRAN) injection 4 mg, 4 mg, Intravenous, Q6H PRN, Lennette Bihari, MD, 4 mg at 10/16/18 1953 .  pantoprazole (PROTONIX) EC tablet  40 mg, 40 mg, Oral, Daily, Theotis Barrio, MD, 40 mg at 10/27/18 0930 .  potassium chloride SA (K-DUR) CR tablet 40 mEq, 40 mEq, Oral, Daily, Bensimhon, Bevelyn Buckles, MD, 40 mEq at 10/27/18 0930 .  sodium chloride flush (NS) 0.9 % injection 10-40 mL, 10-40 mL, Intracatheter, Q12H, Burns Spain, MD, 10 mL at 10/26/18 2123 .  sodium chloride flush (NS) 0.9 % injection 10-40 mL, 10-40 mL, Intracatheter, PRN, Burns Spain, MD .  spironolactone (ALDACTONE) tablet 12.5 mg, 12.5 mg, Oral, Daily, Bensimhon, Bevelyn Buckles, MD, 12.5 mg at 10/27/18 0930 .  tamsulosin (FLOMAX) capsule 0.4 mg, 0.4 mg, Oral, QHS, Theotis Barrio, MD, 0.4 mg at 10/26/18 2123 .  ticagrelor (BRILINTA) tablet 90 mg, 90 mg, Oral, BID, Lennette Bihari, MD, 90 mg at 10/27/18 8288  Patients Current Diet:     Diet Order                  Diet - low sodium heart healthy         Diet Heart Room service appropriate? 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    Self Care: Did the patient need help bathing, dressing, using the toilet or eating?  Independent  Indoor Mobility: Did the patient need assistance with walking from room to room (with or without device)? Independent  Stairs: Did the patient need assistance with internal  or external stairs (with or without device)? Independent  Functional Cognition: Did the patient need help planning regular tasks such as shopping or remembering to take medications? Independent  Home Assistive Devices / Equipment Home Assistive Devices/Equipment: None Home Equipment: Toilet riser, Grab bars - tub/shower, Hand held shower head  Prior Device Use: Indicate devices/aids used by the patient prior to current illness, exacerbation or injury? None of the above  Current Functional Level Cognition  Overall Cognitive Status: Within Functional Limits for tasks assessed Orientation Level: Oriented X4    Extremity Assessment (includes Sensation/Coordination)  Upper Extremity Assessment: Generalized weakness  Lower Extremity Assessment: Defer to PT evaluation    ADLs  Overall ADL's : Needs assistance/impaired Grooming: Min guard, Standing, Oral care Upper Body Bathing: Minimal assistance, Sitting Lower Body Bathing: Moderate assistance, Sit to/from stand Upper Body Dressing : Minimal assistance, Sitting Lower Body Dressing: Moderate assistance, Sit to/from stand Lower Body Dressing Details (indicate cue type and reason): able to figure 4 with R LE but not L LE  Toilet Transfer: Moderate assistance, Ambulation, +2 for safety/equipment Toilet Transfer Details (indicate cue type and reason): simulated to/from recliner  Toileting- Clothing Manipulation and Hygiene: Moderate assistance, Sit to/from stand Functional mobility during ADLs: Minimal assistance, Rolling walker General ADL Comments: pt appears SOB with talking, but Sp02 remained in mid 90s    Mobility  Overal bed mobility: Needs Assistance Bed Mobility: Supine to Sit Supine to sit: Min assist General bed mobility comments: pulled up on therapist's hand, use of rail, HOB up 20 degrees    Transfers  Overall transfer level: Needs assistance  Equipment used: Rolling walker (2 wheeled) Transfers: Sit to/from  Stand Sit to Stand: [employer] transfer comment: cues for hand placement, increased time and effort    Ambulation / Gait / Stairs / Psychologist, prison and probation services  Ambulation/Gait Ambulation/Gait assistance: Land (Feet): 110 Feet Assistive device: Rolling walker (2 wheeled) Gait Pattern/deviations: Step-through pattern, Decreased stride length, Trunk flexed General Gait Details: Pt with cues for posture and position in RW. Pt fatigued with gait and unable to tolerate further gait Gait velocity interpretation: >2.62 ft/sec, indicative of community ambulatory    Posture / Balance Balance Overall balance assessment: Needs assistance Sitting-balance support: No upper extremity supported, Feet supported Sitting balance-[name]: Fair Standing balance support: Bilateral upper extremity supported, During functional activity Standing balance-[name]: Poor Standing balance comment: bil UE support on RW with gait    Special needs/care consideration BiPAP/CPAP n/a CPM n/a Continuous Drip IV n/a Dialysis n/a Life Vest n/a Oxygen n/a Special Bed n/a Trach Size n/a Wound Vac n/a Skin ecchymosis BUE Bowel mgmt: incontinent LBM 10/26/2018 Bladder mgmt:continent Diabetic mgmt yes Behavioral consideration  N/a Chemo/radiation  N/a   Previous Home Environment  Living Arrangements: Alone  Lives With: Alone Available Help at Discharge: Family, Available PRN/intermittently Type of Home: House Home Layout: One level Home Access: Ramped entrance Bathroom Shower/Tub: Curtain, Engineer, manufacturing systems: Standard Bathroom Accessibility: Yes How Accessible: Accessible via walker Home Care Services: No  Discharge Living Setting Plans for Discharge Living Setting: Patient's home, Alone Type of Home at Discharge: House Discharge Home Layout: One level Discharge Home Access: Ramped entrance Discharge Bathroom Shower/Tub: Tub/shower unit, Curtain Discharge  Bathroom Toilet: Standard Discharge Bathroom Accessibility: Yes How Accessible: Accessible via walker Does the patient have any problems obtaining your medications?: No  Social/Family/Support Systems Contact Information: sister Larita Fife, who is out of town Anticipated Caregiver: friends intermittently Anticipated Industrial/product designer Information: see above Caregiver Availability: Intermittent Discharge Plan Discussed with Primary Caregiver: Yes Is Caregiver In Agreement with Plan?: Yes Does Caregiver/Family have Issues with Lodging/Transportation while Pt is in Rehab?: No  Goals/Additional Needs Patient/Family Goal for Rehab: Mod I to supervision PT, OT and SLP Expected length of stay: ELOS 10 to 12 days Pt/Family Agrees to Admission and willing to participate: Yes Program Orientation Provided & Reviewed with Pt/Caregiver Including Roles  & Responsibilities: Yes  Decrease burden of Care through IP rehab admission: n/a  Possible need for SNF placement upon discharge:not anticipated  Patient Condition: This patient's medical and functional status has changed since the consult dated: 10/21/2018 in which the Rehabilitation Physician determined and documented that the patient's condition is appropriate for intensive rehabilitative care in an inpatient rehabilitation facility. See "History of Present Illness" (above) for medical update. Functional changes are: min assist, 110' with RW. Patient's medical and functional status update has been discussed with the Rehabilitation physician and patient remains appropriate for inpatient rehabilitation. Will admit to inpatient rehab today.  Preadmission Screen Completed By: Ottie Glazier RN MSN with updates by Estill Dooms, PT, DPT time 12:07 PM, 10/27/18    Estill Dooms, PT, DPT Admissions Coordinator [phone] 10/27/18  12:07 PM  _____________________   Discussed status with Dr. Riley Kill on 10/27/18 at 12:07 PM  and received approval for  admission today.  Admission Coordinator: Ottie Glazier RN MSN 10/24/2018 at 1600         Cosigned by: Ranelle Oyster, MD at 10/27/2018 12:27 PM  Revision History

## 2018-10-27 NOTE — Care Management Important Message (Signed)
Important Message  Patient Details  Name: Justin Bradford. MRN: [id number] Date of Birth: May 12, 1941   Medicare Important Message Given:  Yes    Tatsuya Okray 10/27/2018, 4:25 PM

## 2018-10-27 NOTE — Progress Notes (Signed)
Justin Arn, MD  Physician  Physical Medicine and Rehabilitation  Consult Note  Signed  Date of Service:  10/21/2018 9:54 AM       Related encounter: ED to Hosp-Admission (Discharged) from 10/15/2018 in [facility]      Signed      Expand All Collapse All    Show:Clear all _0 Manual_1 Template_2 Copied  Added by: _3 Cathlyn Parsons, PA-C_4 Justin Arn, MD  _5 Hover for details      Physical Medicine and Rehabilitation Consult Reason for Consult:  Decreased functional mobility Referring Physician: internal medicine   HPI: Justin Alred. is a 78 y.o.right handed male with history of hypertension, type 2 diabetes mellitus, tobacco abuse.  Per chart review and patient, patient was independent prior to admission with no local family in the area. One level home with ramped entrance. He has 2 sisters who live out-of-town. 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  He presented on 10/15/2018 with chest pain and shortness of breath.  Work-up revealed troponin 0.05, lactic acid 1.7, potassium 3.4, WBC 16,600-24,600. Chest x-ray COPD without acute airspace disease. CT angiogram of the chest no evidence of pulmonary emboli. ECG showing mild ST elevation elevation in anterior leads.  Echocardiogram with ejection fraction of 35% moderate to severe reduced systolic function. Cardiac catheterization 10/16/2018 with total occlusion LAD. Required norepinephrine for blood pressure control IV Lasix for diuresis. Maintained on IV heparin transitioning to Brilinta. Hospital course compromised by hypoxic respiratory failure with right greater than left pulmonary infiltrates, suspected CAP coverage 5 day total antibiotics.  Slow wean from oxygen therapy, however you notes symptomatic improvement with nebulizer treatments. Tolerating a regular diet. Therapy evaluations completed with recommendations of physical medicine rehabilitation  consult.   Review of Systems  Constitutional: Positive for malaise/fatigue. Negative for chills.  HENT: Negative for hearing loss.   Eyes: Negative for blurred vision and double vision.  Respiratory: Positive for shortness of breath.   Gastrointestinal: Positive for constipation. Negative for heartburn and nausea.  Genitourinary: Positive for urgency. Negative for dysuria, flank pain and hematuria.  Musculoskeletal: Positive for myalgias.  Skin: Negative for rash.  Neurological: Positive for weakness. Negative for seizures.  All other systems reviewed and are negative.      Past Medical History:  Diagnosis Date   Hypertension    Peripheral neuropathy         Past Surgical History:  Procedure Laterality Date   CORONARY STENT INTERVENTION N/A 10/16/2018   Procedure: CORONARY STENT INTERVENTION;  Surgeon: Troy Sine, MD;  Location: [facility];  Service: Cardiovascular;  Laterality: N/A;   KNEE SURGERY     LEFT HEART CATH AND CORONARY ANGIOGRAPHY N/A 10/16/2018   Procedure: LEFT HEART CATH AND CORONARY ANGIOGRAPHY;  Surgeon: Troy Sine, MD;  Location: [facility];  Service: Cardiovascular;  Laterality: N/A;   No pertinent family history of premature CAD Social History:  reports that he has quit smoking. He has never used smokeless tobacco. He reports current alcohol use. He reports that he does not use drugs. Allergies: No Known Allergies       Medications Prior to Admission  Medication Sig Dispense Refill   aspirin EC 81 MG tablet Take 81 mg by mouth daily.     BREO ELLIPTA 100-25 MCG/INH AEPB Inhale 1 puff into the lungs daily.     FLUoxetine (PROZAC) 20 MG capsule Take 60 mg by mouth every morning.  hydrochlorothiazide (HYDRODIURIL) 50 MG tablet Take 50 mg by mouth every morning.      lamoTRIgine (LAMICTAL) 25 MG tablet Take 75 mg by mouth at bedtime.     losartan (COZAAR) 25 MG tablet Take 50 mg by mouth every morning.      metFORMIN (GLUCOPHAGE) 500 MG tablet Take 250 mg by mouth every morning.      omeprazole (PRILOSEC) 20 MG capsule Take 20 mg by mouth every morning.     simvastatin (ZOCOR) 40 MG tablet Take 40 mg by mouth every morning.      spironolactone (ALDACTONE) 25 MG tablet Take 25 mg by mouth 2 (two) times daily.      tamsulosin (FLOMAX) 0.4 MG CAPS capsule Take 0.4 mg by mouth at bedtime.      VENTOLIN HFA 108 (90 Base) MCG/ACT inhaler Inhale 1 puff into the lungs every 4 (four) hours as needed for wheezing.     metoprolol succinate (TOPROL-XL) 50 MG 24 hr tablet Take 50 mg by mouth daily.      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   Functional Status:  Mobility: [place] bed mobility: Needs Assistance Bed Mobility: Supine to Sit Supine to sit: Min assist, Mod assist General bed mobility comments: Needed assist for LEs and for elevation of trunk.  Transfers Overall transfer level: Needs assistance Equipment used: (EVA walker) Transfers: Sit to/from Stand Sit to Stand: Mod assist, +2 physical assistance General transfer comment: Pt needed assist to power up and cues for hand placement Ambulation/Gait Ambulation/Gait assistance: Mod assist, +2 safety/equipment Gait Distance (Feet): 120 Feet Assistive device: (EVA walker) Gait Pattern/deviations: Step-through pattern, Decreased stride length, Trunk flexed, Wide base of support, Staggering right, Staggering left, Leaning  posteriorly General Gait Details: Pt generally unsteady needing steadying assist throughout. Pt needed assist to steer [place] walker as well as needed cues for posture as he flexes forward quite a bit.  Pt fatigues and needs resting stand breaks as well as constant cues for pursed lip breathing.  Pt O2 on 6L was 94% at rest.  With activity, needed 15 LHFNC to keep sats >85%.  DOE 4/4 by end of walk and 2/4 at rest.  Pt reports feeling anxious and asking about his anxiety medication.  Gait velocity interpretation: <1.31 ft/sec, indicative of household ambulator  ADL:  Cognition: Cognition Overall Cognitive Status: Within Functional Limits for tasks assessed Orientation Level: Oriented X4 Cognition Arousal/Alertness: Awake/alert Behavior During Therapy: WFL for tasks assessed/performed Overall Cognitive Status: Within Functional Limits for tasks assessed  Blood pressure 105/77, pulse 98, temperature 98 F (36.7 C), temperature source Oral, resp. rate (!) 24, height 6' (1.829 m), weight 128.7 kg, SpO2 92 %. Physical Exam  Vitals reviewed. Constitutional: He is oriented to person, place, and time. He appears well-developed.  Obese  HENT:  Head: Normocephalic and atraumatic.  Eyes: EOM are normal.  Respiratory: Effort normal.  + [place]  GI: He exhibits no distension.  Musculoskeletal:     Comments: No edema or tenderness in extremities  Neurological: He is alert and oriented to person, place, and  time.  Follows full commands Motor: Grossly 4-/5 throughout  Skin: Skin is warm and dry.  Psychiatric: He has a normal mood and affect. His behavior is normal.    LabResultsLast24Hours       Results for orders placed or performed during the hospital encounter of 10/15/18 (from the past 24 hour(s))  Sedimentation rate     Status: Abnormal   Collection Time: 10/20/18  2:33 PM  Result Value Ref Range   Sed Rate 42 (H) 0 - 16 mm/hr  Heparin level (unfractionated)     Status: None    Collection Time: 10/20/18  5:45 PM  Result Value Ref Range   Heparin Unfractionated 0.38 0.30 - 0.70 IU/mL  CBC     Status: Abnormal   Collection Time: 10/21/18  4:17 AM  Result Value Ref Range   WBC 17.0 (H) 4.0 - 10.5 K/uL   RBC 4.26 4.22 - 5.81 MIL/uL   Hemoglobin 12.9 (L) 13.0 - 17.0 g/dL   HCT 39.2 39.0 - 52.0 %   MCV 92.0 80.0 - 100.0 fL   MCH 30.3 26.0 - 34.0 pg   MCHC 32.9 30.0 - 36.0 g/dL   RDW 13.3 11.5 - 15.5 %   Platelets 327 150 - 400 K/uL   nRBC 0.0 0.0 - 0.2 %  Heparin level (unfractionated)     Status: None   Collection Time: 10/21/18  4:17 AM  Result Value Ref Range   Heparin Unfractionated 0.48 0.30 - 0.70 IU/mL  Basic metabolic panel     Status: Abnormal   Collection Time: 10/21/18  4:17 AM  Result Value Ref Range   Sodium 133 (L) 135 - 145 mmol/L   Potassium 3.5 3.5 - 5.1 mmol/L   Chloride 94 (L) 98 - 111 mmol/L   CO2 25 22 - 32 mmol/L   Glucose, Bld 165 (H) 70 - 99 mg/dL   BUN 30 (H) 8 - 23 mg/dL   Creatinine, Ser 1.37 (H) 0.61 - 1.24 mg/dL   Calcium 8.3 (L) 8.9 - 10.3 mg/dL   GFR calc non Af Amer 50 (L) >60 mL/min   GFR calc Af Amer 58 (L) >60 mL/min   Anion gap 14 5 - 15  Magnesium     Status: None   Collection Time: 10/21/18  4:17 AM  Result Value Ref Range   Magnesium 2.0 1.7 - 2.4 mg/dL  .Cooxemetry Panel (carboxy, met, total hgb, O2 sat)     Status: None   Collection Time: 10/21/18  4:29 AM  Result Value Ref Range   Total hemoglobin 13.5 12.0 - 16.0 g/dL   O2 Saturation 65.9 %   Carboxyhemoglobin 1.2 0.5 - 1.5 %   Methemoglobin 0.9 0.0 - 1.5 %      ImagingResults(Last48hours)  Dg Chest Port 1 View  Result Date: 10/20/2018 CLINICAL DATA:  Shortness of breath.  Respiratory failure. EXAM: PORTABLE CHEST 1 VIEW COMPARISON:  October 19, 2018 FINDINGS: Right greater than left asymmetric pulmonary infiltrates are stable. A right PICC line terminates in the central SVC, new in the interval. No pneumothorax. No  change in the cardiomediastinal silhouette. IMPRESSION: 1. The new right PICC line terminates in the central SVC. 2. Right greater than left pulmonary infiltrates are stable. 3. No other changes. Electronically Signed   By: Dorise Bullion III M.D   On: 10/20/2018 10:01   Dg Chest Port 1 View  Result Date: 10/19/2018 CLINICAL DATA:  Shortness of breath. EXAM: PORTABLE CHEST 1 VIEW COMPARISON:  10/18/2018  FINDINGS: The cardiac silhouette remains mildly enlarged. They are worsening airspace opacities throughout the right lung. Left basilar airspace opacities are unchanged. Small pleural effusions are not excluded. No pneumothorax is identified. IMPRESSION: Worsening asymmetric airspace disease throughout the right lung. Electronically Signed   By: Logan Bores M.D.   On: 10/19/2018 10:19     Assessment/Plan: Diagnosis: Debility Labs and images (see above) independently reviewed.  Records reviewed and summated above.  1. Does the need for close, 24 hr/day medical supervision in concert with the patient's rehab needs make it unreasonable for this patient to be served in a less intensive setting? Yes  2. Co-Morbidities requiring supervision/potential complications: Supplemental oxygen dependent (wean as tolerated), HTN (monitor and provide prns in accordance with increased physical exertion and pain), steroid-induced hyperglycemia on type 2 DM (Monitor in accordance with exercise and adjust meds as necessary), tobacco abuse (counsel), CAD (recs per cards, transition from heparin GGT when appropriate), leukocytosis (repeat labs, cont to monitor for signs and symptoms of infection, further workup if indicated), hyponatremia (repeat labs, consider treatment if necessary), CHF (Monitor in accordance with increased physical activity and avoid UE resistance excercises) 3. Due to safety, disease management and patient education, does the patient require 24 hr/day rehab nursing? Yes 4. Does the patient require  coordinated care of a physician, rehab nurse, PT (1-2 hrs/day, 5 days/week) and OT (1-2 hrs/day, 5 days/week) to address physical and functional deficits in the context of the above medical diagnosis(es)? Yes Addressing deficits in the following areas: balance, endurance, locomotion, strength, transferring, bathing, dressing, toileting and psychosocial support 5. Can the patient actively participate in an intensive therapy program of at least 3 hrs of therapy per day at least 5 days per week? Potentially 6. The potential for patient to make measurable gains while on inpatient rehab is excellent 7. Anticipated functional outcomes upon discharge from inpatient rehab are modified independent and supervision  with PT, modified independent and supervision with OT, n/a with SLP. 8. Estimated rehab length of stay to reach the above functional goals is: 17-20 days. 9. Anticipated D/C setting: Home 10. Anticipated post D/C treatments: HH therapy and Home excercise program 11. Overall Rehab/Functional Prognosis: excellent  RECOMMENDATIONS: This patient's condition is appropriate for continued rehabilitative care in the following setting: CIR when able to tolerate 3 hours of therapy per day. Patient has agreed to participate in recommended program. Yes Note that insurance prior authorization may be required for reimbursement for recommended care.  Comment: Rehab Admissions Coordinator to follow up.   I have personally performed a face to face diagnostic evaluation, including, but not limited to relevant history and physical exam findings, of this patient and developed relevant assessment and plan.  Additionally, I have reviewed and concur with the physician assistant's documentation above.   Delice Lesch, MD, ABPMR Lavon Paganini Angiulli, PA-C 10/21/2018        Revision History                        Routing History

## 2018-10-28 ENCOUNTER — Inpatient Hospital Stay (HOSPITAL_COMMUNITY): Payer: Medicare Other | Admitting: Occupational Therapy

## 2018-10-28 ENCOUNTER — Inpatient Hospital Stay (HOSPITAL_COMMUNITY): Payer: Medicare Other | Admitting: Speech Pathology

## 2018-10-28 ENCOUNTER — Inpatient Hospital Stay (HOSPITAL_COMMUNITY): Payer: Medicare Other

## 2018-10-28 LAB — CBC WITH DIFFERENTIAL/PLATELET
Abs Immature Granulocytes: 0.56 10*3/uL — ABNORMAL HIGH (ref 0.00–0.07)
Basophils Absolute: 0.1 10*3/uL (ref 0.0–0.1)
Basophils Relative: 1 %
Eosinophils Absolute: 0.7 10*3/uL — ABNORMAL HIGH (ref 0.0–0.5)
Eosinophils Relative: 4 %
HCT: 40.9 % (ref 39.0–52.0)
Hemoglobin: 13.1 g/dL (ref 13.0–17.0)
Immature Granulocytes: 3 %
Lymphocytes Relative: 13 %
Lymphs Abs: 2.3 10*3/uL (ref 0.7–4.0)
MCH: 29.8 pg (ref 26.0–34.0)
MCHC: 32 g/dL (ref 30.0–36.0)
MCV: 93.2 fL (ref 80.0–100.0)
Monocytes Absolute: 1.4 10*3/uL — ABNORMAL HIGH (ref 0.1–1.0)
Monocytes Relative: 8 %
Neutro Abs: 12.3 10*3/uL — ABNORMAL HIGH (ref 1.7–7.7)
Neutrophils Relative %: 71 %
Platelets: 455 10*3/uL — ABNORMAL HIGH (ref 150–400)
RBC: 4.39 MIL/uL (ref 4.22–5.81)
RDW: 13.4 % (ref 11.5–15.5)
WBC: 17.3 10*3/uL — ABNORMAL HIGH (ref 4.0–10.5)
nRBC: 0 % (ref 0.0–0.2)

## 2018-10-28 LAB — COMPREHENSIVE METABOLIC PANEL
ALT: 26 U/L (ref 0–44)
AST: 26 U/L (ref 15–41)
Albumin: 3 g/dL — ABNORMAL LOW (ref 3.5–5.0)
Alkaline Phosphatase: 81 U/L (ref 38–126)
Anion gap: 13 (ref 5–15)
BUN: 26 mg/dL — ABNORMAL HIGH (ref 8–23)
CO2: 23 mmol/L (ref 22–32)
Calcium: 8.9 mg/dL (ref 8.9–10.3)
Chloride: 101 mmol/L (ref 98–111)
Creatinine, Ser: 1.55 mg/dL — ABNORMAL HIGH (ref 0.61–1.24)
GFR calc Af Amer: 50 mL/min — ABNORMAL LOW (ref >60)
GFR calc non Af Amer: 43 mL/min — ABNORMAL LOW (ref >60)
Glucose, Bld: 137 mg/dL — ABNORMAL HIGH (ref 70–99)
Potassium: 3.9 mmol/L (ref 3.5–5.1)
Sodium: 137 mmol/L (ref 135–145)
Total Bilirubin: 1.4 mg/dL — ABNORMAL HIGH (ref 0.3–1.2)
Total Protein: 6.9 g/dL (ref 6.5–8.1)

## 2018-10-28 LAB — GLUCOSE, CAPILLARY: Glucose-Capillary: 130 mg/dL — ABNORMAL HIGH (ref 70–99)

## 2018-10-28 NOTE — Care Management Note (Signed)
[facility] Individual Statement of Services  Patient Name:  Justin Bradford.  Date:  10/28/2018  Welcome to the [facility].  Our goal is to provide you with an individualized program based on your diagnosis and situation, designed to meet your specific needs.  With this comprehensive rehabilitation program, you will be expected to participate in at least 3 hours of rehabilitation therapies Monday-Friday, with modified therapy programming on the weekends.  Your rehabilitation program will include the following services:  Physical Therapy (PT), Occupational Therapy (OT), 24 hour per day rehabilitation nursing, Therapeutic Recreaction (TR), Case Management (Social Worker), Rehabilitation Medicine, Nutrition Services and Pharmacy Services  Weekly team conferences will be held on Wednesday to discuss your progress.  Your Social Worker will talk with you frequently to get your input and to update you on team discussions.  Team conferences with you and your family in attendance may also be held.  Expected length of stay: 7-9 days  Overall anticipated outcome: independent with device  Depending on your progress and recovery, your program may change. Your Social Worker will coordinate services and will keep you informed of any changes. Your Social Worker's name and contact numbers are listed  below.  The following services may also be recommended but are not provided by the [facility]patient [facility]:   Driving Evaluations  Home Health Rehabiltiation Services  Outpatient Rehabilitation Services    Arrangements will be made to provide these services after discharge if needed.  Arrangements include referral to agencies that provide these services.  Your insurance has been verified to be:  Medicare Your primary doctor is:  Forrest Moron  Pertinent information will be shared with your doctor and your insurance company.  Social Worker:  Dossie Der, SW [phone] or ([phone]  Information discussed with and copy given to patient by: Lucy Chris, 10/28/2018, 12:29 PM

## 2018-10-28 NOTE — Evaluation (Signed)
Physical Therapy Assessment and Plan  Patient Details  Name: Justin Bradford. MRN: [id number] Date of Birth: [birth year]  PT Diagnosis: Abnormal posture, Difficulty walking, Impaired sensation and Muscle weakness Rehab Potential: Good ELOS: 7-9 days   Today's Date: 10/28/2018 PT Individual Time: 0315-9458 PT Individual Time Calculation (min): 72 min    Problem List:  Patient Active Problem List   Diagnosis Date Noted  . Debility 10/27/2018  . Acute hypoxemic respiratory failure ([place])   . Myocardial infarction involving left anterior descending (LAD) coronary artery ([place]) 10/21/2018  . Supplemental oxygen dependent   . Essential hypertension   . Steroid-induced hyperglycemia   . Diabetes mellitus type 2 in obese ([place])   . Tobacco abuse   . Leukocytosis   . Hyponatremia   . Status post coronary artery stent placement   . Acute systolic heart failure ([place])   . Acute anterior myocardial infarction ([place])   . Atypical pneumonia   . Elevated troponin   . Chest pain 10/15/2018  . COPD exacerbation ([place]) 10/15/2018  . Left wrist injury 03/22/2016  . Bilateral primary osteoarthritis of knee 03/15/2015    Past Medical History:  Past Medical History:  Diagnosis Date  . Hypertension   . Peripheral neuropathy    Past Surgical History:  Past Surgical History:  Procedure Laterality Date  . CORONARY STENT INTERVENTION N/A 10/16/2018   Procedure: CORONARY STENT INTERVENTION;  Surgeon: Troy Sine, MD;  Location: [facility];  Service: Cardiovascular;  Laterality: N/A;  . KNEE SURGERY    . LEFT HEART CATH AND CORONARY ANGIOGRAPHY N/A 10/16/2018   Procedure: LEFT HEART CATH AND CORONARY ANGIOGRAPHY;  Surgeon: Troy Sine, MD;  Location: [facility];  Service: Cardiovascular;  Laterality: N/A;    Assessment & Plan Clinical Impression: Patient is a 78 y.o. 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 One level home with ramped entrance. He has 2 sisters reportedly live out of town. 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 Presented 10/15/2018 with chest pain and shortness of breath. Workup revealed troponin 0.05trending up to 12.54, lactic acid 1.7, potassium 3.4 and WBC 16,600-24,668flt to be reactive without any signs of systemic infection. Chest x-ray COPD without acute airspace disease.viral panel negative. Blood cultures no growth to date. CT angiogram of the chest no evidence of pulmonary emboli. EKG showed mild ST elevation in the anterior leads suspect non-STEMI. Echocardiogram with ejection fraction 35% moderate to severe reduced systolic function. Cardiac catheterization completed 10/16/2018 by Dr. CBurt Knackwith total occlusion LAD and underwent stent placement. Required norepinephrine for blood pressure control as well as IV Lasix for diuresis for acute systolic congestive heart failure. Bouts of atrial fibrillation requiring amiodarone.Maintainedon IV heparin transitioned to Brilinta as well as low-dose aspirin with subcutaneous Lovenox. Hospital course compromised by hypoxic respiratory failure with right greater than left pulmonary infiltrates suspect CAP coverage 5 day total antibiotics. Patient still in need of oxygen therapy needing as much as 6 L oxygen to keep saturations greater than 90% with ambulation as well as nebulizer treatments. Tolerating a regular diet. Therapy evaluations completed and patient was admitted for a comprehensive  rehabilitation program. Patient transferred to CIR on 10/27/2018 .   Patient currently requires min with mobility secondary to muscle  weakness, decreased cardiorespiratoy endurance and decreased standing balance and decreased balance strategies.  Prior to hospitalization, patient was independent  with mobility and lived with Alone in a House home.  Home access is  Ramped entrance.  Patient will benefit from skilled PT intervention to maximize safe functional mobility, minimize fall risk and decrease caregiver burden for planned discharge home alone.  Anticipate patient will benefit from follow up [place] at discharge.  PT - End of Session Activity Tolerance: Tolerates 30+ min activity with multiple rests Endurance Deficit: Yes PT Assessment Rehab Potential (ACUTE/IP ONLY): Good PT Barriers to Discharge: Decreased caregiver support PT Patient demonstrates impairments in the following area(s): Balance;Motor;Endurance;Sensory;Safety PT Transfers Functional Problem(s): Bed Mobility;Bed to Chair;Car;Furniture;Floor PT Locomotion Functional Problem(s): Ambulation;Stairs;Wheelchair Mobility PT Plan PT Intensity: Minimum of 1-2 x/day ,45 to 90 minutes PT Frequency: 5 out of 7 days PT Duration Estimated Length of Stay: 7-9 days PT Treatment/Interventions: Disease management/prevention;Stair training;Visual/perceptual remediation/compensation;Balance/vestibular training;DME/adaptive equipment instruction;Patient/family education;Wheelchair propulsion/positioning;Therapeutic Activities;Cognitive remediation/compensation;Functional electrical stimulation;Therapeutic Exercise;Community reintegration;Functional mobility training;Skin care/wound management;UE/LE Strength taining/ROM;Discharge planning;Neuromuscular re-education;UE/LE Coordination activities;Ambulation/gait training PT Transfers Anticipated Outcome(s): Mod I PT Locomotion Anticipated Outcome(s): Mod I PT Recommendation Recommendations for Other Services: Neuropsych consult;Therapeutic Recreation consult Therapeutic Recreation Interventions: Stress management;Outing/community  reintergration(community access) Follow Up Recommendations: Home health PT Patient destination: Home Equipment Recommended: To be determined  Skilled Therapeutic Intervention Evaluation completed (see details above and below) with education on PT POC and goals and individual treatment initiated with focus on functional mobility, endurance, transfers and balance. Pt supine in bed upon PT arrival, agreeable to therapy tx and denies pain. Pt transferred to sitting EOB with CGA and donned shorts, sit<>stand with RW and min assist to pull pants over hips. Pt ambulated x 5 ft to w/c with RW and transferred to w/c, SpO2 94% on RA. Pt transported to the gym.  Pt performed stand pivot to the car with RW and min assist, cues for techniques, SpO2 95%. Pt transported to gym. Pt ambulated x 100 ft with RW and min assist, verbal cues for upright posture and breathing techniques, SpO2 92% following activity. Pt ambulated x 40 ft to the steps with RW and min assist, ascended/descended 1 step using B rails and min assist, limited by fatigue and decreased endurance. Stand pivot to w/c with min assist and RW. Pt transported back to room and left in w/c with needs in reach and chair alarm set.    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  used to [employer], enjoys music Cognition Overall Cognitive Status:  Within Functional Limits for tasks assessed Orientation Level: Oriented X4 Attention: Focused;Sustained Focused Attention: Appears intact Sustained Attention: Appears intact Memory: Appears intact Awareness: Appears intact Safety/Judgment: Appears intact Sensation Sensation Light Touch: Impaired by gross assessment(history of peripheral neuropathy affect LE sensation) Proprioception: Appears Intact Coordination Gross Motor Movements are Fluid and Coordinated: No Fine Motor Movements  are Fluid and Coordinated: Yes Coordination and Movement Description: gross coordination impaired secondary to weakness Motor  Motor Motor: Within Functional Limits Motor - Skilled Clinical Observations: generalized weakness  Mobility Bed Mobility Bed Mobility: Rolling Right;Rolling Left;Supine to Sit;Sit to Supine Rolling Right: Supervision/verbal cueing Rolling Left: Supervision/Verbal cueing Supine to Sit: Contact Guard/Touching assist Sit to Supine: Contact Guard/Touching assist Transfers Transfers: Sit to Stand;Stand to Sit;Stand Pivot Transfers Sit to Stand: Minimal Assistance - Patient > 75% Stand to Sit: Minimal Assistance - Patient > 75% Stand Pivot Transfers: Minimal Assistance - Patient > 75% Transfer (Assistive device): Rolling walker Locomotion  Gait Ambulation: Yes Gait Assistance: Minimal Assistance - Patient > 75% Gait Distance (Feet): 100 Feet Assistive device: Rolling walker Gait Assistance Details: Verbal cues for technique;Verbal cues for precautions/safety Gait Gait: Yes Gait Pattern: Impaired Gait Pattern: Trunk flexed;Decreased trunk rotation;Decreased step length - left;Decreased step length - right Gait velocity: decreased Wheelchair Mobility Wheelchair Mobility: No  Trunk/Postural Assessment  Cervical Assessment Cervical Assessment: Exceptions to WFL(forward head posture) Thoracic Assessment Thoracic Assessment: Exceptions to WFL(rounded shoulders) Lumbar  Assessment Lumbar Assessment: Exceptions to WFL(posterior pelvic tilt) Postural Control Postural Control: Within Functional Limits  Balance Balance Balance Assessed: Yes Static Sitting Balance Static Sitting - Level of Assistance: 5: Stand by assistance Dynamic Sitting Balance Dynamic Sitting - Level of Assistance: 5: Stand by assistance Static Standing Balance Static Standing - Level of Assistance: 4: Min assist Dynamic Standing Balance Dynamic Standing - Level of Assistance: 4: Min assist Extremity Assessment  RLE Assessment RLE Assessment: Within Functional Limits General Strength Comments: grossly 4/5 throughout LLE Assessment LLE Assessment: Within Functional Limits General Strength Comments: grossly 4/5 throughout   Refer to Care Plan for Long Term Goals  Recommendations for other services: Neuropsych  Discharge Criteria: Patient will be discharged from PT if patient refuses treatment 3 consecutive times without medical reason, if treatment goals not met, if there is a change in medical status, if patient makes no progress towards goals or if patient is discharged from hospital.  The above assessment, treatment plan, treatment alternatives and goals were discussed and mutually agreed upon: by patient  Netta Corrigan, PT, DPT 10/28/2018, 10:14 AM

## 2018-10-28 NOTE — Progress Notes (Signed)
Inpatient Rehabilitation  Patient information reviewed and entered into eRehab system by Angelice Piech M. Nisa Decaire, M.A., CCC/SLP, PPS Coordinator.  Information including medical coding, functional ability and quality indicators will be reviewed and updated through discharge.    

## 2018-10-28 NOTE — Progress Notes (Signed)
Occupational Therapy Session Note  Patient Details  Name: Eura Rueda. MRN: [id number] Date of Birth: 1940/07/30  Today's Date: 10/28/2018 OT Individual Time: 3244-0102 OT Individual Time Calculation (min): 38 min    Short Term Goals: Week 1:  OT Short Term Goal 1 (Week 1): STG=LTG 2/2 ELOS  Skilled Therapeutic Interventions/Progress Updates:  Pt transferred to the EOB with min assist from supine with HOB elevated up to 45 degrees.  Once on the EOB he was able to donn his gripper socks with increased time and min assist.  Decreased efficiency when reaching down to his feet secondary to flexibility issues.  He was able to complete sit to stand with min assist and ambulate to the door with min guard and then back to the bed.  HR increased to 85 with O2 sats at 94%.  Finished session with discussion of home situation and expectations of being independent at discharge.  Pt left back in the bed with call button and phone in reach and bed alarm in place.    Therapy Documentation Precautions:  Precautions Precautions: Fall Restrictions Weight Bearing Restrictions: No  Pain: Pain Assessment Pain Scale: Faces Pain Score: 0-No pain ADL: See Care Tool for some details of ADL  Therapy/Group: Individual Therapy  Ashten Prats OTR/L 10/28/2018, 4:43 PM

## 2018-10-28 NOTE — Progress Notes (Signed)
[facility] PHYSICAL MEDICINE & REHABILITATION PROGRESS NOTE   Subjective/Complaints:  FFinished working with SLP no sig cog def no swallowing issues  ROS- neg CP, SOB , N/V/D  Objective:   No results found. Recent Labs    10/26/18 0500 10/28/18 0414  WBC 19.2* 17.3*  HGB 12.9* 13.1  HCT 39.9 40.9  PLT 411* 455*   Recent Labs    10/27/18 1003 10/28/18 0414  NA 135 137  K 4.7 3.9  CL 102 101  CO2 24 23  GLUCOSE 125* 137*  BUN 23 26*  CREATININE 1.44* 1.55*  CALCIUM 8.6* 8.9    Intake/Output Summary (Last 24 hours) at 10/28/2018 0853 Last data filed at 10/28/2018 0455 Gross per 24 hour  Intake 296 ml  Output 450 ml  Net -154 ml     Physical Exam: Vital Signs Blood pressure 103/68, pulse 75, temperature 97.9 F (36.6 C), temperature source Oral, resp. rate 15, weight 123.8 kg, SpO2 94 %.  General: No acute distress Mood and affect are appropriate Heart: Regular rate and rhythm no rubs murmurs or extra sounds Lungs: Clear to auscultation, breathing unlabored, no rales or wheezes Abdomen: Positive bowel sounds, soft nontender to palpation, nondistended Extremities: No clubbing, cyanosis, or edema Skin: No evidence of breakdown, no evidence of rash Neurologic: Cranial nerves II through XII intact, motor strength is 4/5 in bilateral deltoid, bicep, tricep, grip, hip flexor, knee extensors, ankle dorsiflexor and plantar flexor  Musculoskeletal: Full range of motion in all 4 extremities. No joint swelling    Assessment/Plan: 1. Functional deficits secondary to debility which require 3+ hours per day of interdisciplinary therapy in a comprehensive inpatient rehab setting.  Physiatrist is providing close team supervision and 24 hour management of active medical problems listed below.  Physiatrist and rehab team continue to assess barriers to discharge/monitor patient progress toward functional and medical goals  Care Tool:  Bathing              Bathing  assist       Upper Body Dressing/Undressing Upper body dressing        Upper body assist      Lower Body Dressing/Undressing Lower body dressing            Lower body assist       Toileting Toileting    Toileting assist Assist for toileting: Moderate Assistance - Patient 50 - 74%     Transfers Chair/bed transfer  Transfers assist           Locomotion Ambulation   Ambulation assist              Walk 10 feet activity   Assist           Walk 50 feet activity   Assist           Walk 150 feet activity   Assist           Walk 10 feet on uneven surface  activity   Assist           Wheelchair     Assist               Wheelchair 50 feet with 2 turns activity    Assist            Wheelchair 150 feet activity     Assist           Medical Problem List and Plan: 1.Debilitysecondary to CAD/non-STEMI status post cardiac catheterizationwith stentingcomplicated by hypoxic  respiratory failure/CAP/COPD CIR PT, OT, SLP 2. Antithrombotics: -DVT/anticoagulation:Lovenox -antiplatelet therapy: aspirin 81 mg daily,Brilinta 90 mg twice a day 3. Pain Management:Tylenol as needed 4. Mood:Klonopin 1 mg daily at bedtime, Prozac 60 mg daily,Lamictal 75 mg daily -antipsychotic agents: N/A 5. Neuropsych: This patientiscapable of making decisions on hisown behalf. 6. Skin/Wound Care:Routine skin checks 7. Fluids/Electrolytes/Nutrition:Routine in and out's with follow-up chemistries 8. Atrial fibrillation.Amiodarone 200 mg twice a day1 month.Cardiac rate controlled 9. Hypertension. Coreg 3.125 mg twice a day, Aldactone 12.5 mg daily, Cozaar 12.5 mg daily Vitals:   10/28/18 0435 10/28/18 0803  BP: 103/68   Pulse: 67 75  Resp: 19 15  Temp: 97.9 F (36.6 C)   SpO2: 95% 94%  Controlled monitor for orthostasis10. Acute systolic congestive heart failure.  Lasix80 mg daily. -Monitor for any signs of fluid overload.  -daily weights  -Follow-upas outpt withcardiology services 11.AKI. Resolved.  -follow-up chemistrieson admit 12. Diabetes mellitus. Blood sugar checks currently discontinued. Patient was on Glucophage 250 mg daily prior to admission. CBG (last 3)  Recent Labs    10/27/18 1651 10/27/18 2106 10/28/18 0625  GLUCAP 159* 139* 130*  controlled 4/28 13. Reactive leukocytosis. WBC consistently elevated without signs of systemic infection. Could be secondary to recent MI. May require workup as an outpatient Trending down-afeb   14. COPD/CAP with tobacco abuse. Counseling. Wean of oxygen. Complete course of Zithromax and Rocephin 15. Hyperlipidemia. Lipitor 16. BPH. Flomax    LOS: 1 days A FACE TO FACE EVALUATION WAS PERFORMED  Erick Colace 10/28/2018, 8:53 AM

## 2018-10-28 NOTE — Progress Notes (Signed)
Social Work  Social Work Assessment and Plan  Patient Details  Name: Justin Bradford. MRN: [id number] Date of Birth: Mar 02, 1941  Today's Date: 10/28/2018  Problem List:  Patient Active Problem List   Diagnosis Date Noted  . Debility 10/27/2018  . Acute hypoxemic respiratory failure ([facility])   . Myocardial infarction involving left anterior descending (LAD) coronary artery ([facility]) 10/21/2018  . Supplemental oxygen dependent   . Essential hypertension   . Steroid-induced hyperglycemia   . Diabetes mellitus type 2 in obese ([facility])   . Tobacco abuse   . Leukocytosis   . Hyponatremia   . Status post coronary artery stent placement   . Acute systolic heart failure ([facility])   . Acute anterior myocardial infarction ([facility])   . Atypical pneumonia   . Elevated troponin   . Chest pain 10/15/2018  . COPD exacerbation ([facility]) 10/15/2018  . Left wrist injury 03/22/2016  . Bilateral primary osteoarthritis of knee 03/15/2015   Past Medical History:  Past Medical History:  Diagnosis Date  . Hypertension   . Peripheral neuropathy    Past Surgical History:  Past Surgical History:  Procedure Laterality Date  . CORONARY STENT INTERVENTION N/A 10/16/2018   Procedure: CORONARY STENT INTERVENTION;  Surgeon: Lennette Bihari, MD;  Location: [facility] IN[facility];  Service: Cardiovascular;  Laterality: N/A;  . KNEE SURGERY    . LEFT HEART CATH AND CORONARY ANGIOGRAPHY N/A 10/16/2018   Procedure: LEFT HEART CATH AND CORONARY ANGIOGRAPHY;  Surgeon: Lennette Bihari, MD;  Location: [facility] INVASIVE CV LAB;  Service: Cardiovascular;  Laterality: N/A;   Social History:  reports that he has quit smoking. He has never used smokeless tobacco. He reports current alcohol use. He reports that he does not use drugs.  Family / Support Systems Marital Status: Widow/Widower Patient Roles: Other (Comment)(sibling) Other Supports: Larita [place]-sister [phone]  Shawnie Dapper [phone] Anticipated Caregiver:  Self Ability/Limitations of Caregiver: Has a few friends who will call and check on him Caregiver Availability: Intermittent Family Dynamics: Pt is close with both of his sister's and nieces and nephews, but all are out of town and call him but that's it. He has a nieghbor who checks on him and his few friends who will call. Pt will need to be mod/i to be able to go home alone  Social History Preferred language: English Religion:  Cultural Background: No issues Education: College Read: Yes Write: Yes Legal History/Current Legal Issues: No issues Guardian/Conservator: None-according  to MD pt is capable of making his own decisions while here   Abuse/Neglect Abuse/Neglect Assessment Can Be Completed: Yes Physical Abuse: Denies Verbal Abuse: Denies Sexual Abuse: Denies Exploitation of patient/patient's resources: Denies Self-Neglect: Denies  Emotional Status Pt's affect, behavior and adjustment status: Pt is motivated to do well here, he has always been independent and taken care of himself. He also took care of his wife before she died. 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 Recent Psychosocial Issues: other health issues were being managed by his MD's Psychiatric History: History of anxiety and depression since wife's death-has been seeing a counselor once a week for the last six years. He enjoys this and finds it helpful. He has spoke with him on the phone since beign here in the hosptial. Substance Abuse History: Remote smoker-no other issues  Patient / Family Perceptions, Expectations & Goals Pt/Family understanding of illness & functional limitations: Pt is motivated to do well and has a good understanidng of his  medical issues. He talks with the MD daily and feels he has a good understnaidng of his treatment plan going forward. His sister's call daily and he telsls them wha tis going on with him. Premorbid pt/family roles/activities: retiree, widower, sibling,  uncle, home owner, friend, etc Anticipated changes in roles/activities/participation: resume Pt/family expectations/goals: Pt states: " I need to be able to take care of myself before I leave here. I have too I have no one else to do it for me."  [employer]: Other (Comment)(Volunteers at [employer] for the last 15 years) Premorbid Home Care/DME Agencies: Other (Comment)(has rw and had hh in the past) Transportation available at discharge: Self Resource referrals recommended: Support group (specify)  Discharge Planning Living Arrangements: Alone Support Systems: Other relatives, Friends/neighbors Type of Residence: Private residence Insurance Resources: [employer] Financial Resources: Social Security Financial Screen Referred: No Living Expenses: Own Money Management: Patient Does the patient have any problems obtaining your medications?: No Home Management: Self Patient/Family Preliminary Plans: Return home independently he will have a few friends check on him by phone but that is it. He will be responsible for all of his care at discharge. Will await therapists evaluations and work on best plan for pt. [place] Barriers to Discharge: Lack of/limited family support [place] Barriers to Discharge Comments: Has no family in the area and a few friends Social Work Anticipated Follow Up Needs: HH/OP, Support Group  Clinical Impression Pleasant talkative gentleman who is motivated to do well and regain his independence while here. He has always been independent and has done for others. He plans on being mod/i by the time he leaves here. Will await therapy evals and pt see's a counselor weekly for his coping from wife's death will not make a neuro-psych referral due to this, unless team feels needed.  Lucy Chris 10/28/2018, 12:26 PM

## 2018-10-28 NOTE — Evaluation (Signed)
Speech Language Pathology Assessment and Plan  Patient Details  Name: Justin Bradford. MRN: [id number] Date of Birth: [birth year]  Evaluation only  Today's Date: 10/28/2018 SLP Individual Time: 6213-0865 SLP Individual Time Calculation (min): 60 min   Problem List:  Patient Active Problem List   Diagnosis Date Noted  . Debility 10/27/2018  . Acute hypoxemic respiratory failure ([place])   . Myocardial infarction involving left anterior descending (LAD) coronary artery ([place]) 10/21/2018  . Supplemental oxygen dependent   . Essential hypertension   . Steroid-induced hyperglycemia   . Diabetes mellitus type 2 in obese ([place])   . Tobacco abuse   . Leukocytosis   . Hyponatremia   . Status post coronary artery stent placement   . Acute systolic heart failure ([place])   . Acute anterior myocardial infarction ([place])   . Atypical pneumonia   . Elevated troponin   . Chest pain 10/15/2018  . COPD exacerbation ([place]) 10/15/2018  . Left wrist injury 03/22/2016  . Bilateral primary osteoarthritis of knee 03/15/2015   Past Medical History:  Past Medical History:  Diagnosis Date  . Hypertension   . Peripheral neuropathy    Past Surgical History:  Past Surgical History:  Procedure Laterality Date  . CORONARY STENT INTERVENTION N/A 10/16/2018   Procedure: CORONARY STENT INTERVENTION;  Surgeon: Troy Sine, MD;  Location: [facility];  Service: Cardiovascular;  Laterality: N/A;  . KNEE SURGERY    . 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 Patient was independent prior to admission no local family in the area. Presented 10/15/2018 with chest pain and shortness of breath. Workup revealed troponin 0.05 trending up to 12.54, lactic acid 1.7, potassium 3.4 and WBC 16,600-24,600 felt to be reactive without any signs of systemic infection . Chest x-ray COPD without acute airspace disease, viral panel negative. Cardiac catheterization completed 10/16/2018 by Dr. Burt Knack with total occlusion LAD and underwent stent placement. Required norepinephrine for blood pressure control as well as IV Lasix for diuresis for acute systolic congestive heart failure. Hospital course compromised by hypoxic respiratory failure with right greater than left pulmonary infiltrates suspect CAP coverage 5 day total antibiotics. Tolerating a regular diet. Therapy evaluations completed and patient was admitted for a comprehensive rehabilitation program on 10/27/18.   Bedside swallow evaluation and cognitive linguistic evaluations completed. Pt presents with functional ability throughout all assessments. All education completed and all questions answered to pt's satisfaction. Of note, pt reports extensive pyschological issues and is currently seeing Dennie Maizes with [facility] for weekly counseling. He requested I make him aware of pt's hospitalization.    Skilled Therapeutic Interventions          Skilled treatment focused on completing above mentioned assessment and all education completed.   SLP Assessment  Patient does not need any further Speech Lanaguage Pathology Services    Recommendations  SLP Diet Recommendations: Age appropriate regular solids;Thin  Liquid Administration via: Straw Medication Administration: Whole meds with liquid Supervision: Patient able to self feed Postural Changes  and/or Swallow Maneuvers: Seated upright 90 degrees Oral Care Recommendations: Oral care BID Recommendations for Other Services: Neuropsych consult Patient destination: Home Follow up Recommendations: None Equipment Recommended: None recommended by SLP           Pain Pain Assessment Pain Scale: 0-10 Pain Score: 0-No pain  Prior Functioning Cognitive/Linguistic Baseline: Within functional limits Type of Home: House  Lives With: Alone Available Help at Discharge: Family;Available PRN/intermittently Vocation: Volunteer work  Industrial/product designer Term Goals: No short term 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  The above assessment, treatment plan, treatment alternatives and goals were discussed and mutually agreed upon: by patient  Aleesha Ringstad 10/28/2018, 12:56 PM

## 2018-10-28 NOTE — Progress Notes (Signed)
Slept fairly well throughout the night. Pt endorses anxiety at the beginning of the shift related to hospitalization. Pt also endorses history of depression, since the death f his wife. Given tylenol for head/neck/ and upper back pain-effectiveness noted. Blood glucose-130 this am

## 2018-10-29 ENCOUNTER — Inpatient Hospital Stay (HOSPITAL_COMMUNITY): Payer: Medicare Other

## 2018-10-29 ENCOUNTER — Inpatient Hospital Stay (HOSPITAL_COMMUNITY): Payer: Self-pay

## 2018-10-29 ENCOUNTER — Inpatient Hospital Stay (HOSPITAL_COMMUNITY): Payer: Medicare Other | Admitting: Occupational Therapy

## 2018-10-29 ENCOUNTER — Inpatient Hospital Stay (HOSPITAL_COMMUNITY): Payer: Medicare Other | Admitting: Speech Pathology

## 2018-10-29 NOTE — Progress Notes (Signed)
[facility] PHYSICAL MEDICINE & REHABILITATION PROGRESS NOTE   Subjective/Complaints:  C/o Left sided throat pain with swallowing cold liquids No neck pain with PT  ROS- neg CP, SOB , N/V/D  Objective:   No results found. Recent Labs    10/28/18 0414  WBC 17.3*  HGB 13.1  HCT 40.9  PLT 455*   Recent Labs    10/27/18 1003 10/28/18 0414  NA 135 137  K 4.7 3.9  CL 102 101  CO2 24 23  GLUCOSE 125* 137*  BUN 23 26*  CREATININE 1.44* 1.55*  CALCIUM 8.6* 8.9    Intake/Output Summary (Last 24 hours) at 10/29/2018 1018 Last data filed at 10/29/2018 0820 Gross per 24 hour  Intake 684 ml  Output 950 ml  Net -266 ml     Physical Exam: Vital Signs Blood pressure 112/65, pulse 83, temperature 98 F (36.7 C), temperature source Oral, resp. rate 19, weight 124.2 kg, SpO2 92 %.  General: No acute distress Mood and affect are appropriate Heart: Regular rate and rhythm no rubs murmurs or extra sounds Lungs: Clear to auscultation, breathing unlabored, no rales or wheezes Abdomen: Positive bowel sounds, soft nontender to palpation, nondistended Extremities: No clubbing, cyanosis, or edema Skin: No evidence of breakdown, no evidence of rash Neurologic: Cranial nerves II through XII intact, motor strength is 4/5 in bilateral deltoid, bicep, tricep, grip, hip flexor, knee extensors, ankle dorsiflexor and plantar flexor  Musculoskeletal: Full range of motion in all 4 extremities. No joint swelling    Assessment/Plan: 1. Functional deficits secondary to debility which require 3+ hours per day of interdisciplinary therapy in a comprehensive inpatient rehab setting.  Physiatrist is providing close team supervision and 24 hour management of active medical problems listed below.  Physiatrist and rehab team continue to assess barriers to discharge/monitor patient progress toward functional and medical goals  Care Tool:  Bathing    Body parts bathed by patient: Right arm, Left  arm, Front perineal area, Abdomen, Chest, Buttocks, Left upper leg, Right upper leg, Face   Body parts bathed by helper: Right lower leg, Left lower leg     Bathing assist Assist Level: Minimal Assistance - Patient > 75%     Upper Body Dressing/Undressing Upper body dressing   What is the patient wearing?: Pull over shirt    Upper body assist Assist Level: Minimal Assistance - Patient > 75%    Lower Body Dressing/Undressing Lower body dressing      What is the patient wearing?: Pants     Lower body assist Assist for lower body dressing: Moderate Assistance - Patient 50 - 74%     Toileting Toileting    Toileting assist Assist for toileting: Minimal Assistance - Patient > 75%     Transfers Chair/bed transfer  Transfers assist     Chair/bed transfer assist level: Contact Guard/Touching assist     Locomotion Ambulation   Ambulation assist      Assist level: Supervision/Verbal cueing Assistive device: Walker-rolling Max distance: 54   Walk 10 feet activity   Assist     Assist level: Supervision/Verbal cueing Assistive device: Walker-rolling   Walk 50 feet activity   Assist    Assist level: Supervision/Verbal cueing Assistive device: Walker-rolling    Walk 150 feet activity   Assist           Walk 10 feet on uneven surface  activity   Assist           Wheelchair  Assist               Wheelchair 50 feet with 2 turns activity    Assist            Wheelchair 150 feet activity     Assist           Medical Problem List and Plan: 1.Debilitysecondary to CAD/non-STEMI status post cardiac catheterizationwith stentingcomplicated by hypoxic respiratory failure/CAP/COPD CIR PT, OT, SLP Team conference today please see physician documentation under team conference tab, met with team face-to-face to discuss problems,progress, and goals. Formulized individual treatment plan based on medical  history, underlying problem and comorbidities. 2. Antithrombotics: -DVT/anticoagulation:Lovenox -antiplatelet therapy: aspirin 81 mg daily,Brilinta 90 mg twice a day 3. Pain Management:Tylenol as needed 4. Mood:Klonopin 1 mg daily at bedtime, Prozac 60 mg daily,Lamictal 75 mg daily -antipsychotic agents: N/A 5. Neuropsych: This patientiscapable of making decisions on hisown behalf. 6. Skin/Wound Care:Routine skin checks 7. Fluids/Electrolytes/Nutrition:Routine in and out's with follow-up chemistries 8. Atrial fibrillation.Amiodarone 200 mg twice a day1 month.Cardiac rate controlled 9. Hypertension. Coreg 3.125 mg twice a day, Aldactone 12.5 mg daily, Cozaar 12.5 mg daily Vitals:   10/29/18 0527 10/29/18 0810  BP: 112/65   Pulse: 83   Resp: 19   Temp: 98 F (36.7 C)   SpO2: 92% 92%  Controlled monitor for orthostasis10. Acute systolic congestive heart failure. Lasix80 mg daily. -Monitor for any signs of fluid overload.  -daily weights  -Follow-upas outpt withcardiology services 11.AKI. Resolved.  -follow-up chemistrieson admit 12. Diabetes mellitus. Blood sugar checks currently discontinued. Patient was on Glucophage 250 mg daily prior to admission. CBG (last 3)  Recent Labs    10/27/18 1651 10/27/18 2106 10/28/18 0625  GLUCAP 159* 139* 130*  controlled 4/28 13. Reactive leukocytosis. WBC consistently elevated without signs of systemic infection. Could be secondary to recent MI. May require workup as an outpatient Trending down-afeb   14. COPD/CAP with tobacco abuse. Counseling. Wean of oxygen. Complete course of Zithromax and Rocephin 15. Hyperlipidemia. Lipitor 16. BPH. Flomax    LOS: 2 days A FACE TO FACE EVALUATION WAS PERFORMED  Charlett Blake 10/29/2018, 10:18 AM

## 2018-10-29 NOTE — Progress Notes (Signed)
Patient slept well throughout the night. No complaints noted.  

## 2018-10-29 NOTE — Progress Notes (Signed)
Physical Therapy Session Note  Patient Details  Name: Justin Bradford. MRN: [id number] Date of Birth: [birth year]  Today's Date: 10/29/2018 PT Individual Time: 1015-1100 PT Individual Time Calculation (min): 45 min   Short Term Goals: Week 1:  PT Short Term Goal 1 (Week 1): STG=LTG due to ELOS  Skilled Therapeutic Interventions/Progress Updates:    Pt supine in bed upon PT arrival, agreeable to therapy tx and denies pain. Pt reports he is really tired from previous therapy session this AM. Pt transferred to sitting EOB with supervision, stand pivot to w/c with min assist. Pt transported to the gym. PT performed seated LE therex: 2 x 10 LAQ and x 20 marches. Pt worked on dynamic standing balance this session with single UE support on RW, x 2 trials with CGA. Pt worked on standing balance and activity tolerance to perform toe taps on 4 inch step with UE support on RW, x 2 trials CGA, SpO2 95% following activity. Pt ambulated x 60 ft with RW and CGA, cues for upright posture and breathing techniques, SpO2 96% following activity. Pt transported back to room, ambulated from w/c<>bathroom 2 x 10 ft with RW and CGA. Pt performed clothing management and peri-care without assist, continent of bowels. Pt transferred to bed and left supine with needs in reach and bed alarm set.    Therapy Documentation Precautions:  Precautions Precautions: Fall Restrictions Weight Bearing Restrictions: No   Therapy/Group: Individual Therapy  Cresenciano Genre, PT, DPT 10/29/2018, 7:56 AM

## 2018-10-29 NOTE — Progress Notes (Addendum)
Physical Therapy Session Note  Patient Details  Name: Justin Bradford. MRN: [id number] Date of Birth: 1940/11/01  Today's Date: 10/29/2018 PT Individual Time: 8921-1941 and 1530-1557 PT Individual Time Calculation (min): 57 min and 27 min  Short Term Goals: Week 1:  PT Short Term Goal 1 (Week 1): STG=LTG due to ELOS  Skilled Therapeutic Interventions/Progress Updates:   tx 1:  Pt lying in bed receiving meds from [place], [place].  Pt denied pain.  Pt doffed paper pants and donned shorts in bed with min assist.  Cues for rolling L before sitting up, min assist overall.  Sti> stand and stand pivot with CGA, RW. SpO2 91% with talking.  PT cued him to reduce talking during session.   Stand pivot transfer to NuStep, at level 3 x 3 min and 30 second, when pt was exhausted.  O2 sats 91%.  PT instructed pt in slow diaphragmatic breathing.  O2 sats quickly rose to 93%.  Gait training on level tile with RW, x 54', close supervision, cues for upright posture, forward gaze.  O2 sats 92% and HR 94 after ambulating.    Therapeutic exercises performed with LE to increase strength for functional mobility: seated in upright posture-10 x 1 each R/L long arc quad knee extension, heel raises, toe raises.  Pt required rest breaks between exs.  Pt left resting in bed with alarm set and all needs within reach.  tx 2:  Pt asleep in bed but easily awakened.  He stated that he had a good, but tiring day so far.  He agreed to bedside tx.  Supine Therapeutic exercise performed with LE to increase strength for functional mobility 15 x 1 each alternating ankle pumps, bil hip internal rotation, 10 x 1 each bil pelvic tilts with adductor squeeze q tilt.  6 x 2 cervical flexion.  Pt required rest breaks between q set of exs.  IV entered room to remove PIC line.  Pt left resting in bed with needs at hand and alarm set.       Therapy Documentation Precautions:  Precautions Precautions:  Fall Restrictions Weight Bearing Restrictions: No    O2 Device: Room Air Pain: Pain Assessment Pain Scale: 0-10 Pain Score: 0-No pain AM and PM txs       Therapy/Group: Individual Therapy  Salvador Bigbee 10/29/2018, 9:46 AM

## 2018-10-29 NOTE — Patient Care Conference (Signed)
Inpatient RehabilitationTeam Conference and Plan of Care Update Date: 10/29/2018   Time: 11:30 AM    Patient Name: Justin Bradford.      Medical Record Number: [id number]  Date of Birth: [birth year] Sex: Male         Room/Bed: 4W15C/4W15C-01 Payor Info: Payor: MEDICARE / Plan: MEDICARE PART A AND B / Product Type: *No Product type* /    Admitting Diagnosis: debility  Admit Date/Time:  10/27/2018  3:19 PM Admission Comments: No comment available   Primary Diagnosis:  <principal problem not specified> Principal Problem: <principal problem not specified>  Patient Active Problem List   Diagnosis Date Noted  . Debility 10/27/2018  . Acute hypoxemic respiratory failure ([facility])   . Myocardial infarction involving left anterior descending (LAD) coronary artery ([facility]) 10/21/2018  . Supplemental oxygen dependent   . Essential hypertension   . Steroid-induced hyperglycemia   . Diabetes mellitus type 2 in obese ([facility])   . Tobacco abuse   . Leukocytosis   . Hyponatremia   . Status post coronary artery stent placement   . Acute systolic heart failure ([facility])   . Acute anterior myocardial infarction ([facility])   . Atypical pneumonia   . Elevated troponin   . Chest pain 10/15/2018  . COPD exacerbation ([facility]) 10/15/2018  . Left wrist injury 03/22/2016  . Bilateral primary osteoarthritis of knee 03/15/2015    Expected Discharge Date: Expected Discharge Date: 11/07/18  Team Members Present: Physician leading conference: Dr. Claudette Laws Social Worker Present: Dossie Der, LCSW Nurse Present: Allayne Stack, RN PT Present: Woodfin Ganja, PT OT Present: Perrin Maltese, OT SLP Present: Reuel Derby, SLP PPS Coordinator present : Fae Pippin     Current Status/Progress Goal Weekly Team Focus  Medical   Cont of bowel and bladder, BPs a little soft  maintain medical stability reduce fall  improve endurance   Bowel/Bladder   Continent of B/B LBM-4/28  Maintain continence of B/B  Assist  with toileting needs as needed   Swallow/Nutrition/ Hydration             ADL's   Min A functional transfers, mod A LB ADLs, fatigues quickly  Mod I  activity tolerance, self-care retraining, strengthening,    Mobility   min assist for all mobility up to 100 ft with RW, limited by fatigue and endurance  mod I  balance, strength, endurance, activity tolerance   Communication             Safety/Cognition/ Behavioral Observations            Pain   Denies pain  Remain pain free  Assess for pain every shift and as needed   Skin   MASD to scrotal area  promote healing, maintain skin integrity  Assess skin every shift and as needed      *See Care Plan and progress notes for long and short-term goals.     Barriers to Discharge  Current Status/Progress Possible Resolutions Date Resolved   Physician    Medical stability     Progressing   Cont CIR, PT, OT      Nursing  Decreased caregiver support               PT  Decreased caregiver support                 OT Decreased caregiver support  Pt only has friends who can stop in intermittently  SLP                SW Lack of/limited family support Has no family in the area and a few friends            Discharge Planning/Teaching Needs:  HOme alone with intermittent assist from friends. Sister's are out of town but call daily to check on him.      Team Discussion:  Goals mod/i level and currently min level and is working on endurance. Pt tends to fatigue easily and needs rest breaks. Speech evaluated and will not be picking him up. Will need to be mod/i to go home safely alone  Revisions to Treatment Plan:  DC 5/8    Continued Need for Acute Rehabilitation Level of Care: The patient requires daily medical management by a physician with specialized training in physical medicine and rehabilitation for the following conditions: Daily direction of a multidisciplinary physical rehabilitation program to ensure safe treatment  while eliciting the highest outcome that is of practical value to the patient.: Yes Daily medical management of patient stability for increased activity during participation in an intensive rehabilitation regime.: Yes Daily analysis of laboratory values and/or radiology reports with any subsequent need for medication adjustment of medical intervention for : Cardiac problems;Blood pressure problems   I attest that I was present, lead the team conference, and concur with the assessment and plan of the team.   Lucy Chris 10/29/2018, 1:16 PM

## 2018-10-29 NOTE — Progress Notes (Signed)
Occupational Therapy Session Note  Patient Details  Name: Justin Bradford. MRN: [id number] Date of Birth: 14-Aug-1940  Today's Date: 10/29/2018 OT Individual Time: 2353-6144 OT Individual Time Calculation (min): 61 min    Short Term Goals: Week 1:  OT Short Term Goal 1 (Week 1): STG=LTG 2/2 ELOS  Skilled Therapeutic Interventions/Progress Updates:    Pt completed supine to sit X 2 with supervision and increased time.  First attempt he tried to sit up from a flat position and was successful, but needed to expend a lot of energy and two attempts to complete.  On second attempt had pt roll to the right side and push from sidelying with less effort.  He also recognized this as well.  Pt then ambulated from his bed out into the hallway with min guard assist.  He managed to distances of 80 and 105 ft with use of the RW for support.  Next had pt roll the wheelchair down to the tub/shower room with supervision and was educated on use of a tub bench.  He completed transfer with min guard assist to the RW.  Finished with return to the room via wheelchair with pt left sitting up in the wheelchair with call button and phone in reach with safety alarm belt in place.    Therapy Documentation Precautions:  Precautions Precautions: Fall Restrictions Weight Bearing Restrictions: No  Pain: Pain Assessment Pain Scale: Faces Pain Score: 0-No pain ADL: See Care Tool Section for some details of ADL  Therapy/Group: Individual Therapy  Yelina Sarratt OTR/L 10/29/2018, 3:38 PM

## 2018-10-30 ENCOUNTER — Inpatient Hospital Stay (HOSPITAL_COMMUNITY): Payer: Self-pay | Admitting: Physical Therapy

## 2018-10-30 ENCOUNTER — Inpatient Hospital Stay (HOSPITAL_COMMUNITY): Payer: Medicare Other | Admitting: Occupational Therapy

## 2018-10-30 ENCOUNTER — Inpatient Hospital Stay (HOSPITAL_COMMUNITY): Payer: Medicare Other | Admitting: Physical Therapy

## 2018-10-30 LAB — GLUCOSE, CAPILLARY: Glucose-Capillary: 108 mg/dL — ABNORMAL HIGH (ref 70–99)

## 2018-10-30 NOTE — Progress Notes (Signed)
Physical Therapy Session Note  Patient Details  Name: Justin Bradford. MRN: [id number] Date of Birth: 1941-02-03  Today's Date: 10/30/2018 PT Individual Time: 0901-1001  And 1308-6578 PT Individual Time Calculation (min): 60 min and 74 min  Short Term Goals: Week 1:  PT Short Term Goal 1 (Week 1): STG=LTG due to ELOS  Skilled Therapeutic Interventions/Progress Updates:   Session 1: Pt received supine in bed, asleep upon therapist arrival. Pt easily awakens to verbal stimulus and eager to participate in therapy session. Pt performed supine to sit, HOB partially elevated and using bedrails,with supervision and increased time. Pt performed sit<>stand from EOB/w/c to RW/no AD with CGA throughout session. Pt performed stand pivot transfers EOB<>w/c<>EOM using RW with CGA progressed to supervision throughout session. Pt ambulated 153ft using RW with CGA for steadying - reported increased fatigue at end of walk with SpO2 95% increasing to 98% within 30 seconds (pt on RA throughout session). Pt ascended/descended 4 steps using bilateral handrails with CGA for steadying and step-to pattern - pt reports increased fatigue and defers additional stair navigation at this time. Pt performed dynamic standing balance tasks of: - 2 sets horseshoes without UE support focusing on reaching outside BOS with feet shoulder width apart and CGA for steadying - 2 sets of alternate B LE foot taps on 4" step without UE support and CGA/min assist for balance Pt transported back to room in w/c. Performed stand pivot transfer w/c to EOB using RW with close supervision for safety. Performed sit to supine with supervision and pt left supine in bed with needs in reach and bed alarm on.    Session 2: Pt received supine in bed and agreeable to therapy session despite reporting increased fatigue from AM PT and OT sessions. Pt performed supine to sit, HOB flat and no bedrails, with supervision. Pt performed sit<>stand w/c<>RW with  close supervision and w/c<>no AD with CGA for steadying throughout session. Pt transported to/from therapy gym in w/c for energy conservation. Pt performed standing ball toss into trampoline with CGA for steadying beginning with light weight ball progressed to 2 kg ball as well as progression from shoulder width to narrow BOS stance - pt able to toss ball ~12 times prior to requiring seated rest break due to increased respiratory rate and fatigue. Pt performed dynamic standing balance task of standing on airex foam with feet close together while creating 2 PEG board patterns without UE support - CGA for steadying throughout. Pt ambulated ~51ft using RW with CGA for steadying and pt demonstrated significantly decreased step length and gait speed compared to earlier session with pt reporting fatigue. Pt performed stand pivot transfer Nustep<>w/c using RW with CGA for steadying. Performed Nustep on level 4 for 7 minutes at an average of 30-40 step per minute to a total of 222 steps - cuing throughout to maintain steps per minute at 40. Pt transported back to room and performed stand pivot transfer w/c to EOB using RW with close supervision for safety. Performed sit to supine with supervision and pt set-up for meal with HOB elevated maximally. Pt left sitting in bed with needs in reach and bed alarm on.   Therapy Documentation Precautions:  Precautions Precautions: Fall Restrictions Weight Bearing Restrictions: No  Pain:   Session 1: Denies pain during session. Session 2: Denies pain during session.  Therapy/Group: Individual Therapy  Ginny Forth, PT, DPT 10/30/2018, 7:45 AM

## 2018-10-30 NOTE — IPOC Note (Signed)
Overall Plan of Care [facility]) Patient Details Name: Davon Bowe. MRN: [id number] DOB: [birth year]  Admitting Diagnosis: <principal problem not specified>  Hospital Problems: Active Problems:   Debility     Functional Problem List: Nursing Behavior, Bladder, Endurance, Medication Management, Nutrition, Safety, Skin Integrity  PT Balance, Motor, Endurance, Sensory, Safety  OT Balance, Endurance, Motor  SLP    TR         Basic ADL's: OT Grooming, Bathing, Dressing, Toileting     Advanced  ADL's: OT Simple Meal Preparation     Transfers: PT Bed Mobility, Bed to Chair, Car, Furniture, Floor  OT Toilet, Tub/Shower     Locomotion: PT Ambulation, Stairs, Wheelchair Mobility     Additional Impairments: OT None  SLP None      TR      Anticipated Outcomes Item Anticipated Outcome  Self Feeding    Swallowing      Basic self-care  Mod I  Toileting  Mod I   Bathroom Transfers Mod I  Bowel/Bladder  remain continent, regularly empty bowel and bladder  Transfers  Mod I  Locomotion  Mod I  Communication     Cognition     Pain  no pain or less than 3  Safety/Judgment  free of falls, infection and skin breakdown   Therapy Plan: PT Intensity: Minimum of 1-2 x/day ,45 to 90 minutes PT Frequency: 5 out of 7 days PT Duration Estimated Length of Stay: 7-9 days OT Intensity: Minimum of 1-2 x/day, 45 to 90 minutes OT Frequency: 5 out of 7 days OT Duration/Estimated Length of Stay: 7-9 days     Due to the current state of emergency, patients may not be receiving their 3-hours of Medicare-mandated therapy.   Team Interventions: Nursing Interventions Patient/Family Education, Skin Care/Wound Management, Psychosocial Support, Bladder Management, Cognitive Remediation/Compensation, Bowel Management  PT interventions Disease management/prevention, Stair training, Visual/perceptual remediation/compensation, Warden/ranger, DME/adaptive equipment  instruction, Patient/family education, Wheelchair propulsion/positioning, Therapeutic Activities, Cognitive remediation/compensation, Functional electrical stimulation, Therapeutic Exercise, Community reintegration, Functional mobility training, Skin care/wound management, UE/LE Strength taining/ROM, Discharge planning, Neuromuscular re-education, UE/LE Coordination activities, Ambulation/gait training  OT Interventions Warden/ranger, Community reintegration, Discharge planning, DME/adaptive equipment instruction, Functional mobility training, Patient/family education, Psychosocial support, Self Care/advanced ADL retraining, Therapeutic Activities, Therapeutic Exercise, UE/LE Strength taining/ROM, UE/LE Coordination activities  SLP Interventions    TR Interventions    SW/CM Interventions Discharge Planning, Psychosocial Support, Patient/Family Education   Barriers to Discharge MD  Medical stability and Weight  Nursing Decreased caregiver support    PT Decreased caregiver support    OT Decreased caregiver support Pt only has friends who can stop in intermittently   SLP      SW Lack of/limited family support Has no family in the area and a few friends   Team Discharge Planning: Destination: PT-Home ,OT- Home , SLP-Home Projected Follow-up: PT-Home health PT, OT-  Home health OT, SLP-None Projected Equipment Needs: PT-To be determined, OT- To be determined, SLP-None recommended by SLP Equipment Details: PT- , OT-  Patient/family involved in discharge planning: PT- Patient,  OT-Patient, SLP-Patient  MD ELOS: 10-12d Medical Rehab Prognosis:  Good Assessment:  78 year old right handed male with history of hyperlipidemia,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 One level home with ramped entrance. He has 2 sisters reportedly live out of town. Active prior to admission volunteers at [facility]. Presented 10/15/2018 with chest pain and shortness of breath. Workup revealed troponin 0.05trending up to 12.54, lactic acid 1.7, potassium 3.4 and WBC 16,600-24,671felt to be reactive without any signs of systemic infection. Chest x-ray COPD without acute airspace disease.viral panel negative. Blood cultures no growth to date. CT angiogram of the chest no evidence of pulmonary emboli. EKG showed mild ST elevation in the anterior leads suspect non-STEMI. Echocardiogram with ejection fraction 35% moderate to severe reduced systolic function. Cardiac catheterization completed 10/16/2018 by Dr. Excell Seltzer with total occlusion LAD and underwent stent placement. Required norepinephrine for blood pressure control as well as IV Lasix for diuresis for acute systolic congestive heart failure. Bouts of atrial fibrillation requiring amiodarone.Maintainedon IV heparin transitioned to Brilinta as well as low-dose aspirin with subcutaneous Lovenox. Hospital course compromised by hypoxic respiratory failure with right greater than left pulmonary infiltrates suspect CAP coverage 5 day total antibiotics. Patient still in need of oxygen therapy needing as much as 6 L oxygen to keep saturations greater than 90% with ambulation as well as nebulizer treatments.    Now requiring 24/7 Rehab RN,MD, as well as CIR level PT, OT and SLP.  Treatment team will focus on ADLs and mobility with goals set at Mod I See Team Conference Notes for weekly updates to the plan of care

## 2018-10-30 NOTE — Progress Notes (Signed)
Social Work Patient ID: Justin Rout., male   DOB: Sep 09, 1940, 78 y.o.   MRN: [id number] Met with pt to discuss team conference goals mod/i level and discharge date 5/8. He is very pleased with his progress and from yesterday to today he has done very well. He wants to make sure he is independent since he has no one and will be home alone responsible for all of his needs. Will work on discharge needs.

## 2018-10-30 NOTE — Progress Notes (Signed)
Slept well throughout the night. No complaints noted.  

## 2018-10-30 NOTE — Progress Notes (Signed)
Occupational Therapy Session Note  Patient Details  Name: Thurl Kristiansen. MRN: [id number] Date of Birth: 12/23/1940  Today's Date: 10/30/2018 OT Individual Time: 1101-1157 OT Individual Time Calculation (min): 56 min    Short Term Goals: Week 1:  OT Short Term Goal 1 (Week 1): STG=LTG 2/2 ELOS  Skilled Therapeutic Interventions/Progress Updates:    Pt worked on bathing and dressing during session.  He was able to transfer from supine to sit EOB with supervision and then complete ambulation to the shower with use of the RW and min guard assist.  He then completed undressing with min guard as well as completion of all showering sit to stand with supervision level.  He ambulated out to the EOB for dressing tasks sit to stand.  UB dressing was performed at supervision level with LB dressing at min assist in order to pull his gripper socks up on his feet.  Session finished with return to the bed with pt resting and call button and phone in reach and bed alarm in place.    Therapy Documentation Precautions:  Precautions Precautions: Fall Restrictions Weight Bearing Restrictions: No   Pain: Pain Assessment Pain Scale: 0-10 Pain Score: 0-No pain ADL: See Care Tool Section for some details of ADL  Therapy/Group: Individual Therapy  Sherl Yzaguirre OTR/L 10/30/2018, 4:07 PM

## 2018-10-30 NOTE — Progress Notes (Signed)
Physical Therapy Session Note  Patient Details  Name: Justin Bradford. MRN: [id number] Date of Birth: 09-03-1940  Today's Date: 10/30/2018 PT Individual Time: 8657-8469 PT Individual Time Calculation (min): 29 min   Short Term Goals: Week 1:  PT Short Term Goal 1 (Week 1): STG=LTG due to ELOS  Skilled Therapeutic Interventions/Progress Updates:    Patient received in bed, very pleasant but reporting high levels of fatigue this afternoon. Able to complete bed mobility to R side of bed with min guard, functional transfer to and from [facility] with min guard and RW today. He was transported to the day room totalA in [facility] for energy conservation, and then was able to tolerate gait training approximately 11f with RW and min guard but limited by fatigue. Following rest break, able to self-propel WC approximately 477fwith B UEs but again limited by fatigue. [facility] able to transfer back to bed with min guard and RW. VSS on room air throughout session. He was left in bed with all needs met and bed alarm active this afternoon.   Therapy Documentation Precautions:  Precautions Precautions: Fall Restrictions Weight Bearing Restrictions: No Pain: Pain Assessment Pain Scale: 0-10 Pain Score: 0-No pain    Therapy/Group: Individual Therapy   KrDeniece ReeT, DPT, CBIS  Supplemental Physical Therapist [facility]  Pager [phone] Rehab Office [phone]  10/30/2018, 3:47 PM

## 2018-10-30 NOTE — Progress Notes (Signed)
[place] PHYSICAL MEDICINE & REHABILITATION PROGRESS NOTE   Subjective/Complaints:  Pt fatigued after long day in therapy yesterday  ROS- neg CP, SOB , N/V/D  Objective:   No results found. Recent Labs    10/28/18 0414  WBC 17.3*  HGB 13.1  HCT 40.9  PLT 455*   Recent Labs    10/27/18 1003 10/28/18 0414  NA 135 137  K 4.7 3.9  CL 102 101  CO2 24 23  GLUCOSE 125* 137*  BUN 23 26*  CREATININE 1.44* 1.55*  CALCIUM 8.6* 8.9    Intake/Output Summary (Last 24 hours) at 10/30/2018 0832 Last data filed at 10/30/2018 0526 Gross per 24 hour  Intake 490 ml  Output 250 ml  Net 240 ml     Physical Exam: Vital Signs Blood pressure 100/69, pulse 74, temperature 98 F (36.7 C), temperature source Oral, resp. rate 19, weight 125.1 kg, SpO2 93 %.  General: No acute distress Mood and affect are appropriate Heart: Regular rate and rhythm no rubs murmurs or extra sounds Lungs: Clear to auscultation, breathing unlabored, no rales or wheezes Abdomen: Positive bowel sounds, soft nontender to palpation, nondistended Extremities: No clubbing, cyanosis, or edema Skin: No evidence of breakdown, no evidence of rash Neurologic: Cranial nerves II through XII intact, motor strength is 4/5 in bilateral deltoid, bicep, tricep, grip, hip flexor, knee extensors, ankle dorsiflexor and plantar flexor  Musculoskeletal: Full range of motion in all 4 extremities. No joint swelling    Assessment/Plan: 1. Functional deficits secondary to debility which require 3+ hours per day of interdisciplinary therapy in a comprehensive inpatient rehab setting.  Physiatrist is providing close team supervision and 24 hour management of active medical problems listed below.  Physiatrist and rehab team continue to assess barriers to discharge/monitor patient progress toward functional and medical goals  Care Tool:  Bathing    Body parts bathed by patient: Right arm, Left arm, Front perineal area,  Abdomen, Chest, Buttocks, Left upper leg, Right upper leg, Face   Body parts bathed by helper: Right lower leg, Left lower leg     Bathing assist Assist Level: Minimal Assistance - Patient > 75%     Upper Body Dressing/Undressing Upper body dressing   What is the patient wearing?: Pull over shirt    Upper body assist Assist Level: Minimal Assistance - Patient > 75%    Lower Body Dressing/Undressing Lower body dressing      What is the patient wearing?: Pants     Lower body assist Assist for lower body dressing: Moderate Assistance - Patient 50 - 74%     Toileting Toileting    Toileting assist Assist for toileting: Minimal Assistance - Patient > 75%     Transfers Chair/bed transfer  Transfers assist     Chair/bed transfer assist level: Contact Guard/Touching assist     Locomotion Ambulation   Ambulation assist      Assist level: Contact Guard/Touching assist Assistive device: Walker-rolling Max distance: 105'   Walk 10 feet activity   Assist     Assist level: Supervision/Verbal cueing Assistive device: Walker-rolling   Walk 50 feet activity   Assist    Assist level: Supervision/Verbal cueing Assistive device: Walker-rolling    Walk 150 feet activity   Assist           Walk 10 feet on uneven surface  activity   Assist           Wheelchair     Assist  Wheelchair 50 feet with 2 turns activity    Assist            Wheelchair 150 feet activity     Assist           Medical Problem List and Plan: 1.Debilitysecondary to CAD/non-STEMI status post cardiac catheterizationwith stentingcomplicated by hypoxic respiratory failure/CAP/COPD CIR PT, OT, SLP  2. Antithrombotics: -DVT/anticoagulation:Lovenox -antiplatelet therapy: aspirin 81 mg daily,Brilinta 90 mg twice a day 3. Pain Management:Tylenol as needed 4. Mood:Klonopin 1 mg daily at bedtime,  Prozac 60 mg daily,Lamictal 75 mg daily -antipsychotic agents: N/A 5. Neuropsych: This patientiscapable of making decisions on hisown behalf. 6. Skin/Wound Care:Routine skin checks 7. Fluids/Electrolytes/Nutrition:Routine in and out's with follow-up chemistries 8. Atrial fibrillation.Amiodarone 200 mg twice a day1 month.Cardiac rate controlled 9. Hypertension. Coreg 3.125 mg twice a day, Aldactone 12.5 mg daily, Cozaar 12.5 mg daily Vitals:   10/30/18 0522 10/30/18 0748  BP: 100/69   Pulse: 74   Resp: 19   Temp: 98 F (36.7 C)   SpO2: 98% 93%  dropped below 90 with sitting, I/O balanced, will hold cozaar for systolic< 11010. Acute systolic congestive heart failure. Lasix80 mg daily. -Monitor for any signs of fluid overload.  -daily weights  -Follow-upas outpt withcardiology services 11.AKI. Resolved.  -follow-up chemistrieson admit 12. Diabetes mellitus. Blood sugar checks currently discontinued. Patient was on Glucophage 250 mg daily prior to admission. CBG (last 3)  Recent Labs    10/27/18 1651 10/27/18 2106 10/28/18 0625  GLUCAP 159* 139* 130*  controlled 4/30 13. Reactive leukocytosis. WBC consistently elevated without signs of systemic infectionTrending down-afeb   14. COPD/CAP with tobacco abuse. Counseling. Wean of oxygen. Complete course of Zithromax and Rocephin 15. Hyperlipidemia. Lipitor 16. BPH. Flomax    LOS: 3 days A FACE TO FACE EVALUATION WAS PERFORMED  Erick Colace 10/30/2018, 8:32 AM

## 2018-10-31 ENCOUNTER — Inpatient Hospital Stay (HOSPITAL_COMMUNITY): Payer: Medicare Other | Admitting: Occupational Therapy

## 2018-10-31 ENCOUNTER — Inpatient Hospital Stay (HOSPITAL_COMMUNITY): Payer: Medicare Other | Admitting: Physical Therapy

## 2018-10-31 NOTE — Plan of Care (Signed)
  Problem: RH KNOWLEDGE DEFICIT GENERAL Goal: RH STG INCREASE KNOWLEDGE OF SELF CARE AFTER HOSPITALIZATION Description Patient will be able to verbalize self care at home including diet, exercise and medication management of condition with cues/handouts.  Outcome: Progressing

## 2018-10-31 NOTE — Progress Notes (Signed)
Occupational Therapy Session Note  Patient Details  Name: Justin Bradford. MRN: [id number] Date of Birth: 1941-06-06  Today's Date: 10/31/2018 OT Individual Time: 0904-1003 OT Individual Time Calculation (min): 59 min    Short Term Goals: Week 1:  OT Short Term Goal 1 (Week 1): STG=LTG 2/2 ELOS  Skilled Therapeutic Interventions/Progress Updates:    Pt transitioned from supine to EOB with close supervision.  He then transferred from the EOB over to the sink for grooming tasks in standing.  Supervision for all functional mobility throughout session with use of the RW and for brushing teeth and combing hair at the sink during 4 min standing interval.  After brief rest break pt ambulated to the dayroom where he worked on endurance building and strengthening with use of the Nustep.  He was able to complete two 6 minute interval using all 4 extremities and resistance on level 5.  He maintained 35-45 steps per minute with each set.  HR at 86 with O2 sats at 97% post each set.  Three minute rest break in between sets as well.  He ambulated back to the room at end of session with min instructional cueing to stay closer to the walker and maintain upright standing posture.  Finished session with pt using urinal in standing with close supervision.  Call button and phone in reach with pt returning to bed to rest.    Therapy Documentation Precautions:  Precautions Precautions: Fall Restrictions Weight Bearing Restrictions: No RUE Weight Bearing: Non weight bearing   Pain: Pain Assessment Pain Scale: Faces Pain Score: 0-No pain ADL: See Care Tool Section for some details of ADL  Therapy/Group: Individual Therapy  Junko Ohagan OTR/L 10/31/2018, 12:16 PM

## 2018-10-31 NOTE — Progress Notes (Signed)
[place] PHYSICAL MEDICINE & REHABILITATION PROGRESS NOTE   Subjective/Complaints:  Pt optimistic, "doubled my walking distance"  ROS- neg CP, SOB , N/V/D  Objective:   No results found. No results for input(s): WBC, HGB, HCT, PLT in the last 72 hours. No results for input(s): NA, K, CL, CO2, GLUCOSE, BUN, CREATININE, CALCIUM in the last 72 hours.  Intake/Output Summary (Last 24 hours) at 10/31/2018 0919 Last data filed at 10/31/2018 0730 Gross per 24 hour  Intake 720 ml  Output 1100 ml  Net -380 ml     Physical Exam: Vital Signs Blood pressure 100/69, pulse 74, temperature 98 F (36.7 C), temperature source Oral, resp. rate 19, weight 121.3 kg, SpO2 97 %.  General: No acute distress Mood and affect are appropriate Heart: Regular rate and rhythm no rubs murmurs or extra sounds Lungs: Clear to auscultation, breathing unlabored, no rales or wheezes Abdomen: Positive bowel sounds, soft nontender to palpation, nondistended Extremities: No clubbing, cyanosis, or edema Skin: No evidence of breakdown, no evidence of rash Neurologic: Cranial nerves II through XII intact, motor strength is 4+/5 in bilateral deltoid, bicep, tricep, grip, hip flexor, knee extensors, ankle dorsiflexor and plantar flexor  Musculoskeletal: Full range of motion in all 4 extremities. No joint swelling    Assessment/Plan: 1. Functional deficits secondary to debility which require 3+ hours per day of interdisciplinary therapy in a comprehensive inpatient rehab setting.  Physiatrist is providing close team supervision and 24 hour management of active medical problems listed below.  Physiatrist and rehab team continue to assess barriers to discharge/monitor patient progress toward functional and medical goals  Care Tool:  Bathing    Body parts bathed by patient: Right arm, Left arm, Front perineal area, Abdomen, Chest, Buttocks, Left upper leg, Right upper leg, Face   Body parts bathed by helper:  Right lower leg, Left lower leg     Bathing assist Assist Level: Supervision/Verbal cueing     Upper Body Dressing/Undressing Upper body dressing   What is the patient wearing?: Pull over shirt    Upper body assist Assist Level: Set up assist    Lower Body Dressing/Undressing Lower body dressing      What is the patient wearing?: Pants     Lower body assist Assist for lower body dressing: Supervision/Verbal cueing     Toileting Toileting    Toileting assist Assist for toileting: Minimal Assistance - Patient > 75%     Transfers Chair/bed transfer  Transfers assist     Chair/bed transfer assist level: Contact Guard/Touching assist     Locomotion Ambulation   Ambulation assist      Assist level: Contact Guard/Touching assist Assistive device: Walker-rolling Max distance: 163ft   Walk 10 feet activity   Assist     Assist level: Contact Guard/Touching assist Assistive device: Walker-rolling   Walk 50 feet activity   Assist    Assist level: Contact Guard/Touching assist Assistive device: Walker-rolling    Walk 150 feet activity   Assist    Assist level: Contact Guard/Touching assist Assistive device: Walker-rolling    Walk 10 feet on uneven surface  activity   Assist           Wheelchair     Assist        Wheelchair assist level: Supervision/Verbal cueing Max wheelchair distance: 52ft     Wheelchair 50 feet with 2 turns activity    Assist            Wheelchair 150 feet  activity     Assist           Medical Problem List and Plan: 1.Debilitysecondary to CAD/non-STEMI status post cardiac catheterizationwith stentingcomplicated by hypoxic respiratory failure/CAP/COPD CIR PT, OT, SLP  2. Antithrombotics: No sign of bleeding or bruising -DVT/anticoagulation:Lovenox -antiplatelet therapy: aspirin 81 mg daily,Brilinta 90 mg twice a day 3. Pain Management:Tylenol as  needed 4. Mood:Klonopin 1 mg daily at bedtime, Prozac 60 mg daily,Lamictal 75 mg daily -antipsychotic agents: N/A 5. Neuropsych: This patientiscapable of making decisions on hisown behalf. 6. Skin/Wound Care:Routine skin checks 7. Fluids/Electrolytes/Nutrition:Routine in and out's with follow-up chemistries 8. Atrial fibrillation.Amiodarone 200 mg twice a day1 month.Cardiac rate controlled 9. Hypertension. Coreg 3.125 mg twice a day, Aldactone 12.5 mg daily, Cozaar 12.5 mg daily Vitals:   10/30/18 0748 10/31/18 0735  BP:    Pulse:    Resp:    Temp:    SpO2: 93% 97%  dropped below 90 with sitting, I/O balanced, will hold cozaar for systolic< 11010. Acute systolic congestive heart failure. Lasix80 mg daily. -Monitor for any signs of fluid overload.  -daily weights  -Follow-upas outpt withcardiology services 11.AKI. Resolved.  -follow-up chemistrieson admit 12. Diabetes mellitus. Blood sugar checks currently discontinued. Patient was on Glucophage 250 mg daily prior to admission. CBG (last 3)  Recent Labs    10/30/18 2130  GLUCAP 108*  controlled 4/30 13. Reactive leukocytosis. WBC consistently elevated without signs of systemic infectionTrending down-afeb   14. COPD/CAP with tobacco abuse. Counseling. Tolerating wean of oxygen. Completed course of Zithromax and Rocephin 15. Hyperlipidemia. Lipitor 16. BPH. Flomax    LOS: 4 days A FACE TO FACE EVALUATION WAS PERFORMED  Erick Colace 10/31/2018, 9:19 AM

## 2018-10-31 NOTE — Progress Notes (Signed)
Physical Therapy Session Note  Patient Details  Name: Justin Bradford. MRN: [id number] Date of Birth: [birth year]  Today's Date: 10/31/2018 PT Individual Time: 1125-1227 and 8185-6314 PT Individual Time Calculation (min): 62 min and 80 min  Short Term Goals: Week 1:  PT Short Term Goal 1 (Week 1): STG=LTG due to ELOS  Skilled Therapeutic Interventions/Progress Updates:   Session 1: Pt received supine in bed and excited to participate with therapy despite reporting he worked very hard during OT session this AM. Pt performed supine to sit, HOB partially elevated, with supervision. Performed sit<>stand from EOB/w/c to RW with close supervision for safety during session. Performed stand pivot transfers bed<>w/c<>EOM using RW with close supervision for safety throughout session. Pt transported in w/c to/from therapy gym for energy conservation. Pt ascended/descended 4 steps x 2 (seated break between) with CGA/close supervision for pt safety and pt varying between step-to pattern and reciprocal stepping - SpO2 monitored at end of 2nd stair navigation with SpO2 of 92% increasing to 94% all on RA.   Pt performed dynamic standing balance tasks of: - throwing horseshoes x2 sets while standing in shoulder width stance on airex foam pad with CGA and intermittent min/mod assist due to minor LOB when reaching further outside BOS  - shooting basketball x 2 sets standing with feet shoulder width apart on firm floor with close supervision for safety  Pt reported his legs are feeling very weak and deferred ambulation this session. Pt transported back to room in w/c. Performed stand pivot transfer w/c to recliner using RW with close supervision. Pt left sitting in recliner with needs in reach, seat belt alarm on, and meal set-up.    Session 2: Pt received sitting in recliner and agreeable to therapy session despite reporting being tired. Pt performed sit<>stand from recliner/w/c/EOM to RW with close supervision  for safety throughout session. Performed stand pivot transfer recliner to w/c using RW with close supervision. Pt transported in w/c to/from therapy gym for energy conservation. Pt ambulated 189ft using RW with close supervision for safety demonstrating minimal decrease in B LE stpe length and decreased gait speed. Provided music per patient's request (beach music) to increase pt's energy levels during therapy due to fatigue - pt repeatedly stating the music increased his energy and mood. Performed 3 sets of alternate B LE foot cone taps without UE support with pt requiring mod assist initially progressed to min assist for balance and cuing for increased upright trunk posture. Extensive pt education on decreasing fall risk in the home including: use of RW for all standing/ambulation tasks, need for basket/bag on RW for carrying items, proper footwear, using urinal at night, and taking up rugs. Pt educated on importance of performing daily exercise at home with HEP created and printout provided to patient.   Pt performed the following exercises included on HEP: - repeated sit<>stand x5 repetitions without UE support with cuing throughout for increased anterior trunk weight shift and education on having RW in front of him for safety at home - supine bridging x 8 repetitions with cuing for increased abdominal muscle activation prior to lifting hips - supine heel slides x10 repetitions per LE with focus on increasing core activation during exercises - standing heel raises x15 repetitions with cuing for UE support on RW for balance and not for lifting  - standing hip abduction with B UE support on RW for balance with extensive cuing for proper form  Pt transported back to room in w/c and requesting  to urinate standing up. Pt performed sit to stand w/c to RW with close supervision for safety and continent of urine in urinal. Performed stand pivot transfer w/c to EOB using RW with supervision and sit to supine  with supervision. Pt left supine in bed with needs in reach and bed alarm on.   Therapy Documentation Precautions:  Precautions Precautions: Fall Restrictions Weight Bearing Restrictions: No  Pain:   Session 1: Pt reports no pain during session.   Session 2: Reports no pain during session.   Therapy/Group: Individual Therapy  Ginny Forth, PT, DPT 10/31/2018, 7:51 AM

## 2018-11-01 ENCOUNTER — Inpatient Hospital Stay (HOSPITAL_COMMUNITY): Payer: Medicare Other | Admitting: Occupational Therapy

## 2018-11-01 ENCOUNTER — Inpatient Hospital Stay (HOSPITAL_COMMUNITY): Payer: Medicare Other | Admitting: Physical Therapy

## 2018-11-01 DIAGNOSIS — N179 Acute kidney failure, unspecified: Secondary | ICD-10-CM

## 2018-11-01 DIAGNOSIS — D72829 Elevated white blood cell count, unspecified: Secondary | ICD-10-CM

## 2018-11-01 DIAGNOSIS — I10 Essential (primary) hypertension: Secondary | ICD-10-CM

## 2018-11-01 DIAGNOSIS — E669 Obesity, unspecified: Secondary | ICD-10-CM

## 2018-11-01 DIAGNOSIS — I5022 Chronic systolic (congestive) heart failure: Secondary | ICD-10-CM

## 2018-11-01 DIAGNOSIS — I5021 Acute systolic (congestive) heart failure: Secondary | ICD-10-CM

## 2018-11-01 DIAGNOSIS — I952 Hypotension due to drugs: Secondary | ICD-10-CM

## 2018-11-01 DIAGNOSIS — E1169 Type 2 diabetes mellitus with other specified complication: Secondary | ICD-10-CM

## 2018-11-01 NOTE — Progress Notes (Signed)
Physical Therapy Session Note  Patient Details  Name: Justin Bradford. MRN: [id number] Date of Birth: 1940-09-09  Today's Date: 11/01/2018 PT Individual Time: 1007-1105 PT Individual Time Calculation (min): 58 min   Short Term Goals: Week 1:  PT Short Term Goal 1 (Week 1): STG=LTG due to ELOS  Skilled Therapeutic Interventions/Progress Updates:   Pt received asleep, supine in bed and eager to participate with therapy session. Performed supine to sit, HOB flat and no bedrails, with supervision for safety. Performed sit<>stand from EOB/w/c to RW with close supervision for safety throughout session. Performed stand pivot transfer EOB to w/c using RW with supervision. Pt transported in w/c to/from therapy gym for energy conservation. Pt ambulated ~223ft using RW with close supervision for safety. Performed cone weaving using RW with close supervision for safety and no LOB. Progressed to obstacle course navigation ambulating with RW including: cone weaving, stepping on/off airex, stepping on/off 2" progressed to 4" step, stepping over sticks, and grabbing cup to place in RW bag - mod progressed to min cuing for AD management for increased pt safety - SpO2 after 1st trial 89% with HR 109bpm quickly recovering to 94% and HR of 88bpm, then after 2nd trial SpO2 94% and HR 88bpm. Pt transported back to room in w/c. Pt request to use urinal. Performed sit<>stand with RW and supervision. Continent of bladder in urinal while standing. Performed stand pivot transfer w/c to EOB using RW with supervision. Performed repeated sit<>stand from EOB, with RW in front of him for safety but no UE support, x 5 for reinforcement of exercise on HEP with proper set-up. Sit to supine supervision. Pt left supine in bed with needs in reach and bed alarm on.   Therapy Documentation Precautions:  Precautions Precautions: Fall Restrictions Weight Bearing Restrictions: No RUE Weight Bearing: Non weight bearing  Pain:  Reports no pain during session.    Therapy/Group: Individual Therapy  Ginny Forth, PT, DPT 11/01/2018, 7:54 AM

## 2018-11-01 NOTE — Progress Notes (Signed)
[place] PHYSICAL MEDICINE & REHABILITATION PROGRESS NOTE   Subjective/Complaints: Patient seen laying in bed this morning.  He states he slept well overnight.  He notes that he is making progress with therapies.  ROS-Denies CP, SOB, N/V/D  Objective:   No results found. No results for input(s): WBC, HGB, HCT, PLT in the last 72 hours. No results for input(s): NA, K, CL, CO2, GLUCOSE, BUN, CREATININE, CALCIUM in the last 72 hours.  Intake/Output Summary (Last 24 hours) at 11/01/2018 1118 Last data filed at 11/01/2018 0713 Gross per 24 hour  Intake 600 ml  Output 875 ml  Net -275 ml     Physical Exam: Vital Signs Blood pressure 109/66, pulse 73, temperature 97.9 F (36.6 C), temperature source Oral, resp. rate 18, weight 124.3 kg, SpO2 95 %. Constitutional: No distress . Vital signs reviewed. HENT: Normocephalic.  Atraumatic. Eyes: EOMI. No discharge. Cardiovascular: No JVD. Respiratory: Normal effort. GI: Non-distended. Musc: No edema or tenderness in extremities. Neurologic: Alert and oriented Motor: Grossly 4+/5 throughout  Skin: Warm and dry.  Intact.  Assessment/Plan: 1. Functional deficits secondary to debility which require 3+ hours per day of interdisciplinary therapy in a comprehensive inpatient rehab setting.  Physiatrist is providing close team supervision and 24 hour management of active medical problems listed below.  Physiatrist and rehab team continue to assess barriers to discharge/monitor patient progress toward functional and medical goals  Care Tool:  Bathing    Body parts bathed by patient: Right arm, Left arm, Front perineal area, Abdomen, Chest, Buttocks, Left upper leg, Right upper leg, Face   Body parts bathed by helper: Right lower leg, Left lower leg     Bathing assist Assist Level: Supervision/Verbal cueing     Upper Body Dressing/Undressing Upper body dressing   What is the patient wearing?: Pull over shirt    Upper body assist  Assist Level: Set up assist    Lower Body Dressing/Undressing Lower body dressing      What is the patient wearing?: Pants     Lower body assist Assist for lower body dressing: Supervision/Verbal cueing     Toileting Toileting    Toileting assist Assist for toileting: Minimal Assistance - Patient > 75%     Transfers Chair/bed transfer  Transfers assist     Chair/bed transfer assist level: Supervision/Verbal cueing     Locomotion Ambulation   Ambulation assist      Assist level: Supervision/Verbal cueing Assistive device: Walker-rolling Max distance: 165ft   Walk 10 feet activity   Assist     Assist level: Supervision/Verbal cueing Assistive device: Walker-rolling   Walk 50 feet activity   Assist    Assist level: Supervision/Verbal cueing Assistive device: Walker-rolling    Walk 150 feet activity   Assist    Assist level: Supervision/Verbal cueing Assistive device: Walker-rolling    Walk 10 feet on uneven surface  activity   Assist           Wheelchair     Assist        Wheelchair assist level: Supervision/Verbal cueing Max wheelchair distance: 39ft     Wheelchair 50 feet with 2 turns activity    Assist            Wheelchair 150 feet activity     Assist           Medical Problem List and Plan: 1.Debilitysecondary to CAD/non-STEMI status post cardiac catheterizationwith stentingcomplicated by hypoxic respiratory failure/CAP/COPD  Continue CIR 2. Antithrombotics:  -DVT/anticoagulation:Lovenox -antiplatelet  therapy: aspirin 81 mg daily,Brilinta 90 mg twice a day 3. Pain Management:Tylenol as needed 4. Mood:Klonopin 1 mg daily at bedtime, Prozac 60 mg daily,Lamictal 75 mg daily -antipsychotic agents: N/A 5. Neuropsych: This patientiscapable of making decisions on hisown behalf. 6. Skin/Wound Care:Routine skin checks 7.  Fluids/Electrolytes/Nutrition:Routine in and out's  8. Atrial fibrillation.Amiodarone 200 mg twice a day1 month.Cardiac rate controlled 9. Hypertension. Coreg 3.125 mg twice a day, Aldactone 12.5 mg daily, Cozaar 12.5 mg daily Vitals:   10/31/18 2036 11/01/18 0440  BP: 99/62 109/66  Pulse: 68 73  Resp: 19 18  Temp: 98 F (36.7 C) 97.9 F (36.6 C)  SpO2: 98% 95%   Hold cozaar for systolic< 110  Remains relatively low on 5/2 10. Acute systolic congestive heart failure. Lasix80 mg daily. -Monitor for any signs of fluid overload.  -daily weights  -Follow-upas outpt withcardiology services Filed Weights   10/30/18 0522 10/31/18 0500 11/01/18 0440  Weight: 125.1 kg 121.3 kg 124.3 kg   Relatively stable on 5/2 11.AKI.    Creatinine 1.55 on 4/28  Encourage fluids  Labs ordered for Monday 12. Diabetes mellitus. Blood sugar checks currently discontinued. Patient was on Glucophage 250 mg daily prior to admission. CBG (last 3)  Recent Labs    10/30/18 2130  GLUCAP 108*   Relatively controlled on 4/30  13. Leukocytosis.   NVBT66.0 on 4/28, labs ordered for Monday  Afebrile  14. COPD/CAP with tobacco abuse. Counseling.Completed course of Zithromax and Rocephin 15. Hyperlipidemia. Lipitor 16. BPH. Flomax   LOS: 5 days A FACE TO FACE EVALUATION WAS PERFORMED   Karis Juba 11/01/2018, 11:18 AM

## 2018-11-01 NOTE — Progress Notes (Signed)
Occupational Therapy Session Note  Patient Details  Name: Justin Bradford. MRN: [id number] Date of Birth: [birth year]  Today's Date: 11/01/2018 OT Individual Time: 7628-3151 OT Individual Time Calculation (min): 60 min    Short Term Goals: Week 1:  OT Short Term Goal 1 (Week 1): STG=LTG 2/2 ELOS  Skilled Therapeutic Interventions/Progress Updates:    Patient in bed and ready for therapy session.  He is pleasant, cooperative and talkative t/o session.  Bed mobility with CS.  Functional ambulation and SPT with RW CG A t/o session. Patient becomes SOB with conversation in stance and following activity in stance.  Reviewed and practiced reach and transport of items in bed room and kitchen areas with good carryover of strategies discussed.  Reviewed options for purchasing groceries and set up of chairs/surfaces in home environment to allow for rest breaks and daily routine.  Patient returned to bed at close of session for a rest break.  Bed alarm set and items within reach.    Therapy Documentation Precautions:  Precautions Precautions: Fall Restrictions Weight Bearing Restrictions: No RUE Weight Bearing: Non weight bearing General:   Vital Signs:   Pain: Pain Assessment Pain Scale: 0-10 Pain Score: 0-No pain   Other Treatments:     Therapy/Group: Individual Therapy  Barrie Lyme 11/01/2018, 8:49 AM

## 2018-11-01 NOTE — Progress Notes (Signed)
Occupational Therapy Session Note  Patient Details  Name: Justin Bradford. MRN: [id number] Date of Birth: 1941-01-24  Today's Date: 11/01/2018 OT Individual Time: 1330-1430 OT Individual Time Calculation (min): 60 min    Short Term Goals: Week 1:  OT Short Term Goal 1 (Week 1): STG=LTG 2/2 ELOS  Skilled Therapeutic Interventions/Progress Updates:    Patient in bed and ready for afternoon therapy session.  Bed mobility SSP to and from supine with CS using rail on one side.  Functional ambulation in room and SPT to/from bed, commode, shower bench and recliner with RW  CS/CG A.  Patient completed shower seated on shower bench - unable to thoroughly wash feet (recommend long handled bath sponge for future shower/bathing activities).  Min A to remove slipper socks, able to donn slipper socks after initial instruction with sock aide.  OH shirt after set up but removed due to size not adequate - put on hospital gowns as there were no disposable scrubs available in his size.  Patient able to stand to urinate with urinal CS level.  Patient SOB with self care tasks requiring rest breaks and increased time to complete ADL.  He returned to bed at close of session with bed alarm set and callbell/tray table in reach.    Therapy Documentation Precautions:  Precautions Precautions: Fall Restrictions Weight Bearing Restrictions: No RUE Weight Bearing: Non weight bearing General:   Vital Signs: Therapy Vitals Temp: 98.5 F (36.9 C) Temp Source: Oral Pulse Rate: 74 Resp: 18 BP: 91/66 Patient Position (if appropriate): Lying Oxygen Therapy SpO2: 98 % O2 Device: Room Air Pain: Pain Assessment Pain Scale: 0-10 Pain Score: 0-No pain   Other Treatments:     Therapy/Group: Individual Therapy  Barrie Lyme 11/01/2018, 3:30 PM

## 2018-11-02 ENCOUNTER — Inpatient Hospital Stay (HOSPITAL_COMMUNITY): Payer: Medicare Other

## 2018-11-02 NOTE — Progress Notes (Signed)
Physical Therapy Session Note  Patient Details  Name: Justin Bradford. MRN: [id number] Date of Birth: [birth year]  Today's Date: 11/02/2018 PT Individual Time: 0915-1015 PT Individual Time Calculation (min): 60 min   Short Term Goals: Week 1:  PT Short Term Goal 1 (Week 1): STG=LTG due to ELOS  Skilled Therapeutic Interventions/Progress Updates:    Pt supine in bed upon PT arrival, agreeable to therapy tx and denies pain. Pt transferred to sitting with supervision and ambulated x 142 ft with RW and supervision from room>gym with cues for breathing techniques. Pt worked on standing balance and standing activity tolerance without UE support- standing on red wedge while completing card matching activity, standing on airex while playing game of checkers, standing ball toss against rebounder trampoline, toe taps to 4 inch step, all with CGA-min assist. Pt performed 2 x 5 sit<>stands without UE support working on LE strength, cues for techniques. Pt ambulated back to room with supervision and RW x 142 ft. Pt maintained standing balance with supervision while using urinal this session, continent of bladder. Pt transferred to supine with supervision and left with needs in reach and chair alarm set.   Therapy Documentation Precautions:  Precautions Precautions: Fall Restrictions Weight Bearing Restrictions: No RUE Weight Bearing: Non weight bearing    Therapy/Group: Individual Therapy  Cresenciano Genre, PT, DPT 11/02/2018, 7:48 AM

## 2018-11-02 NOTE — Progress Notes (Signed)
[facility] PHYSICAL MEDICINE & REHABILITATION PROGRESS NOTE   Subjective/Complaints: Patient seen laying in bed this morning and later ambulating in hallway.  He states he slept well overnight.  He notes he is making good progress.  ROS: Denies CP, SOB, N/V/D  Objective:   No results found. No results for input(s): WBC, HGB, HCT, PLT in the last 72 hours. No results for input(s): NA, K, CL, CO2, GLUCOSE, BUN, CREATININE, CALCIUM in the last 72 hours.  Intake/Output Summary (Last 24 hours) at 11/02/2018 1058 Last data filed at 11/01/2018 2300 Gross per 24 hour  Intake 480 ml  Output 450 ml  Net 30 ml     Physical Exam: Vital Signs Blood pressure 115/73, pulse 77, temperature 97.9 F (36.6 C), temperature source Oral, resp. rate 16, weight 122.8 kg, SpO2 98 %. Constitutional: No distress . Vital signs reviewed. HENT: Normocephalic.  Atraumatic. Eyes: EOMI.  No discharge. Cardiovascular: No JVD. Respiratory: Normal effort. GI: Non-distended. Musc: No edema or tenderness in extremities. Neurologic: Alert and oriented Motor: Grossly 4+/5 throughout, unchanged Skin: Warm and dry.  Intact. Psych: Positive and motivated.  Assessment/Plan: 1. Functional deficits secondary to debility which require 3+ hours per day of interdisciplinary therapy in a comprehensive inpatient rehab setting.  Physiatrist is providing close team supervision and 24 hour management of active medical problems listed below.  Physiatrist and rehab team continue to assess barriers to discharge/monitor patient progress toward functional and medical goals  Care Tool:  Bathing    Body parts bathed by patient: Right arm, Left arm, Front perineal area, Abdomen, Chest, Buttocks, Left upper leg, Right upper leg, Face   Body parts bathed by helper: Right lower leg, Left lower leg     Bathing assist Assist Level: Minimal Assistance - Patient > 75%     Upper Body Dressing/Undressing Upper body dressing   What  is the patient wearing?: Pull over shirt    Upper body assist Assist Level: Set up assist    Lower Body Dressing/Undressing Lower body dressing      What is the patient wearing?: Pants     Lower body assist Assist for lower body dressing: Supervision/Verbal cueing     Toileting Toileting    Toileting assist Assist for toileting: Minimal Assistance - Patient > 75%     Transfers Chair/bed transfer  Transfers assist     Chair/bed transfer assist level: Supervision/Verbal cueing     Locomotion Ambulation   Ambulation assist      Assist level: Supervision/Verbal cueing Assistive device: Walker-rolling Max distance: 246ft   Walk 10 feet activity   Assist     Assist level: Supervision/Verbal cueing Assistive device: Walker-rolling   Walk 50 feet activity   Assist    Assist level: Supervision/Verbal cueing Assistive device: Walker-rolling    Walk 150 feet activity   Assist Walk 150 feet activity did not occur: Safety/medical concerns(fatigue/decreased cardiovascular endurance)  Assist level: Supervision/Verbal cueing Assistive device: Walker-rolling    Walk 10 feet on uneven surface  activity   Assist Walk 10 feet on uneven surfaces activity did not occur: Safety/medical concerns         Wheelchair     Assist Will patient use wheelchair at discharge?: No      Wheelchair assist level: Supervision/Verbal cueing Max wheelchair distance: 39ft     Wheelchair 50 feet with 2 turns activity    Assist            Wheelchair 150 feet activity  Assist           Medical Problem List and Plan: 1.Debilitysecondary to CAD/non-STEMI status post cardiac catheterizationwith stentingcomplicated by hypoxic respiratory failure/CAP/COPD  Continue CIR 2. Antithrombotics:  -DVT/anticoagulation:Lovenox -antiplatelet therapy: aspirin 81 mg daily,Brilinta 90 mg twice a day 3. Pain Management:Tylenol as  needed 4. Mood:Klonopin 1 mg daily at bedtime, Prozac 60 mg daily,Lamictal 75 mg daily -antipsychotic agents: N/A 5. Neuropsych: This patientiscapable of making decisions on hisown behalf. 6. Skin/Wound Care:Routine skin checks 7. Fluids/Electrolytes/Nutrition:Routine in and out's  8. Atrial fibrillation.Amiodarone 200 mg twice a day1 month.Cardiac rate controlled 9. Hypertension. Coreg 3.125 mg twice a day, Aldactone 12.5 mg daily, Cozaar 12.5 mg daily Vitals:   11/02/18 0711 11/02/18 1022  BP:  115/73  Pulse:  77  Resp:    Temp:    SpO2: 98%    Hold cozaar for systolic< 110  Relatively controlled on 5/3 10. Acute systolic congestive heart failure. Lasix80 mg daily. -Monitor for any signs of fluid overload.  -daily weights  -Follow-upas outpt withcardiology services Filed Weights   10/31/18 0500 11/01/18 0440 11/02/18 0610  Weight: 121.3 kg 124.3 kg 122.8 kg   Relatively stable on 5/3 11.AKI.    Creatinine 1.55 on 4/28  Encourage fluids  Labs ordered for tomorrow 12. Diabetes mellitus. Blood sugar checks currently discontinued. Patient was on Glucophage 250 mg daily prior to admission. CBG (last 3)  Recent Labs    10/30/18 2130  GLUCAP 108*   Relatively controlled on 4/30  13. Leukocytosis.   VKPQ24.4 on 4/28, labs ordered for tomorrow  Afebrile  14. COPD/CAP with tobacco abuse. Counseling.Completed course of Zithromax and Rocephin 15. Hyperlipidemia. Lipitor 16. BPH. Flomax   LOS: 6 days A FACE TO FACE EVALUATION WAS PERFORMED  Adaleigh Warf Karis Juba 11/02/2018, 10:58 AM

## 2018-11-03 ENCOUNTER — Inpatient Hospital Stay (HOSPITAL_COMMUNITY): Payer: Self-pay

## 2018-11-03 ENCOUNTER — Inpatient Hospital Stay (HOSPITAL_COMMUNITY): Payer: Medicare Other | Admitting: Occupational Therapy

## 2018-11-03 ENCOUNTER — Inpatient Hospital Stay (HOSPITAL_COMMUNITY): Payer: Medicare Other

## 2018-11-03 LAB — CREATININE, SERUM
Creatinine, Ser: 1.45 mg/dL — ABNORMAL HIGH (ref 0.61–1.24)
GFR calc Af Amer: 54 mL/min — ABNORMAL LOW (ref >60)
GFR calc non Af Amer: 46 mL/min — ABNORMAL LOW (ref >60)

## 2018-11-03 LAB — CBC WITH DIFFERENTIAL/PLATELET
Abs Immature Granulocytes: 0.07 10*3/uL (ref 0.00–0.07)
Basophils Absolute: 0.1 10*3/uL (ref 0.0–0.1)
Basophils Relative: 1 %
Eosinophils Absolute: 0.4 10*3/uL (ref 0.0–0.5)
Eosinophils Relative: 4 %
HCT: 40.1 % (ref 39.0–52.0)
Hemoglobin: 12.8 g/dL — ABNORMAL LOW (ref 13.0–17.0)
Immature Granulocytes: 1 %
Lymphocytes Relative: 14 %
Lymphs Abs: 1.4 10*3/uL (ref 0.7–4.0)
MCH: 29.6 pg (ref 26.0–34.0)
MCHC: 31.9 g/dL (ref 30.0–36.0)
MCV: 92.8 fL (ref 80.0–100.0)
Monocytes Absolute: 0.8 10*3/uL (ref 0.1–1.0)
Monocytes Relative: 8 %
Neutro Abs: 7.2 10*3/uL (ref 1.7–7.7)
Neutrophils Relative %: 72 %
Platelets: 450 10*3/uL — ABNORMAL HIGH (ref 150–400)
RBC: 4.32 MIL/uL (ref 4.22–5.81)
RDW: 13.2 % (ref 11.5–15.5)
WBC: 10 10*3/uL (ref 4.0–10.5)
nRBC: 0 % (ref 0.0–0.2)

## 2018-11-03 LAB — BASIC METABOLIC PANEL
Anion gap: 12 (ref 5–15)
BUN: 19 mg/dL (ref 8–23)
CO2: 24 mmol/L (ref 22–32)
Calcium: 8.9 mg/dL (ref 8.9–10.3)
Chloride: 100 mmol/L (ref 98–111)
Creatinine, Ser: 1.44 mg/dL — ABNORMAL HIGH (ref 0.61–1.24)
GFR calc Af Amer: 54 mL/min — ABNORMAL LOW (ref >60)
GFR calc non Af Amer: 47 mL/min — ABNORMAL LOW (ref >60)
Glucose, Bld: 105 mg/dL — ABNORMAL HIGH (ref 70–99)
Potassium: 3.8 mmol/L (ref 3.5–5.1)
Sodium: 136 mmol/L (ref 135–145)

## 2018-11-03 NOTE — Progress Notes (Signed)
Physical Therapy Session Note  Patient Details  Name: Justin Bradford. MRN: [id number] Date of Birth: Feb 23, 1941  Today's Date: 11/03/2018 PT Individual Time: 5366-4403 and 1602- 1659 PT Individual Time Calculation (min): 72 min and 57 minutes  Short Term Goals: Week 1:  PT Short Term Goal 1 (Week 1): STG=LTG due to ELOS  Skilled Therapeutic Interventions/Progress Updates:    Session 1: Pt supine in bed upon PT arrival, agreeable to therapy tx and denies pain. Pt ambulated x 140 ft to the gym with RW and supervision. Pt ambulated 2 x 50 ft from the mat<>stairs and then ascended/descended 12 steps this session with B rails and supervision working on LE strength and endurance. Pt worked on dynamic standing balance to ambulated without AD this session x 60 ft with min assist. Pt participated in berg balance test and TUG test as detailed below, therapist discussed these results with the pt. Pt worked on dynamic standing balance without AD to perform sidestepping in each direction 2 x 30 ft, backwards ambulation 2 x 20 ft and ambulation with eyes closed 2 x 20 ft, all with min assist. Pt performed standing LE strengthening exercises within the parallel bars 2 x 10 each: mini squats and standing hip abduction. Pt ambulated back to room with supervision and RW, transferred to bed and left supine with needs in reach and bed alarm set.   Session 2: Pt supine in bed upon PT arrival, agreeable to therapy tx and denies pain. Pt ambulated x 140 ft to the gym with RW and supervision. Pt performed standing therex within parallel bars for LE strengthening, 2 x 10 each: mini squats, hip abduction, hip extension, calf raises, hamstring curls and marches, cues for techniques and seated rest breaks in between secondary to fatigue. PT ambulated to mat and transferred to supine with supervision. Pt performed 2 x 10 bridges for hip strengthening, cues for techniques. Pt worked on dynamic standing balance while performing  ball toss activity, x 2 trials with supervision. Pt used rollator this session to work on gait and endurance while walking. Pt ambulated throughout unit this session 100-180 ft bouts, taking seated rest breaks on rollator as needed secondary to fatigue. Pt transferred to bed with supervision and transferred to supine. Pt left supine in bed with needs in reach and bed alarm set.    Therapy Documentation Precautions:  Precautions Precautions: Fall Restrictions Weight Bearing Restrictions: No RUE Weight Bearing: Non weight bearing  Balance Standardized Balance Assessment Standardized Balance Assessment: Timed Up and Go Test;Berg Balance Test Berg Balance Test Sit to Stand: Able to stand without using hands and stabilize independently Standing Unsupported: Able to stand 2 minutes with supervision Sitting with Back Unsupported but Feet Supported on Floor or Stool: Able to sit safely and securely 2 minutes Stand to Sit: Sits safely with minimal use of hands Transfers: Able to transfer safely, definite need of hands Standing Unsupported with Eyes Closed: Able to stand 10 seconds with supervision Standing Ubsupported with Feet Together: Able to place feet together independently and stand for 1 minute with supervision From Standing, Reach Forward with Outstretched Arm: Reaches forward but needs supervision From Standing Position, Pick up Object from Floor: Unable to try/needs assist to keep balance From Standing Position, Turn to Look Behind Over each Shoulder: Looks behind from both sides and weight shifts well Turn 360 Degrees: Able to turn 360 degrees safely but slowly Standing Unsupported, Alternately Place Feet on Step/Stool: Able to complete >2 steps/needs minimal assist  Standing Unsupported, One Foot in Front: Able to take small step independently and hold 30 seconds Standing on One Leg: Tries to lift leg/unable to hold 3 seconds but remains standing independently Total Score: 35 Timed Up  and Go Test TUG: Normal TUG Normal TUG (seconds): 29.02(without AD)   Therapy/Group: Individual Therapy  Cresenciano Genre, PT, DPT 11/03/2018, 7:54 AM

## 2018-11-03 NOTE — Progress Notes (Signed)
[facility] PHYSICAL MEDICINE & REHABILITATION PROGRESS NOTE   Subjective/Complaints:  Seen in gym, able to walk from room with mild/mod dyspnea, per PT O2 sats are note dropping  ROS: Denies CP, SOB, N/V/D  Objective:   No results found. No results for input(s): WBC, HGB, HCT, PLT in the last 72 hours. Recent Labs    11/03/18 0610  CREATININE 1.45*    Intake/Output Summary (Last 24 hours) at 11/03/2018 0840 Last data filed at 11/03/2018 0533 Gross per 24 hour  Intake 360 ml  Output 725 ml  Net -365 ml     Physical Exam: Vital Signs Blood pressure 108/61, pulse 72, temperature 97.8 F (36.6 C), temperature source Oral, resp. rate 19, weight 126 kg, SpO2 94 %. Constitutional: No distress . Vital signs reviewed. HENT: Normocephalic.  Atraumatic. Eyes: EOMI.  No discharge. Cardiovascular: No JVD. Respiratory: Normal effort. GI: Non-distended. Musc: No edema or tenderness in extremities. Neurologic: Alert and oriented Motor: Grossly 4+/5 throughout, unchanged Skin: Warm and dry.  Intact. Psych: Positive and motivated.  Assessment/Plan: 1. Functional deficits secondary to debility which require 3+ hours per day of interdisciplinary therapy in a comprehensive inpatient rehab setting.  Physiatrist is providing close team supervision and 24 hour management of active medical problems listed below.  Physiatrist and rehab team continue to assess barriers to discharge/monitor patient progress toward functional and medical goals  Care Tool:  Bathing    Body parts bathed by patient: Right arm, Left arm, Front perineal area, Abdomen, Chest, Buttocks, Left upper leg, Right upper leg, Face   Body parts bathed by helper: Right lower leg, Left lower leg     Bathing assist Assist Level: Minimal Assistance - Patient > 75%     Upper Body Dressing/Undressing Upper body dressing   What is the patient wearing?: Pull over shirt    Upper body assist Assist Level: Set up assist     Lower Body Dressing/Undressing Lower body dressing      What is the patient wearing?: Pants     Lower body assist Assist for lower body dressing: Supervision/Verbal cueing     Toileting Toileting    Toileting assist Assist for toileting: Minimal Assistance - Patient > 75%     Transfers Chair/bed transfer  Transfers assist     Chair/bed transfer assist level: Supervision/Verbal cueing     Locomotion Ambulation   Ambulation assist      Assist level: Supervision/Verbal cueing Assistive device: Walker-rolling Max distance: 142 ft   Walk 10 feet activity   Assist     Assist level: Supervision/Verbal cueing Assistive device: Walker-rolling   Walk 50 feet activity   Assist    Assist level: Supervision/Verbal cueing Assistive device: Walker-rolling    Walk 150 feet activity   Assist Walk 150 feet activity did not occur: Safety/medical concerns(fatigue/decreased cardiovascular endurance)  Assist level: Supervision/Verbal cueing Assistive device: Walker-rolling    Walk 10 feet on uneven surface  activity   Assist Walk 10 feet on uneven surfaces activity did not occur: Safety/medical concerns         Wheelchair     Assist Will patient use wheelchair at discharge?: No      Wheelchair assist level: Supervision/Verbal cueing Max wheelchair distance: 60ft     Wheelchair 50 feet with 2 turns activity    Assist            Wheelchair 150 feet activity     Assist  Medical Problem List and Plan: 1.Debilitysecondary to CAD/non-STEMI status post cardiac catheterizationwith stentingcomplicated by hypoxic respiratory failure/CAP/COPD  Continue CIR PT, OT 2. Antithrombotics:  -DVT/anticoagulation:Lovenox -antiplatelet therapy: aspirin 81 mg daily,Brilinta 90 mg twice a day 3. Pain Management:Tylenol as needed 4. Mood:Klonopin 1 mg daily at bedtime, Prozac 60 mg daily,Lamictal 75 mg  daily -antipsychotic agents: N/A 5. Neuropsych: This patientiscapable of making decisions on hisown behalf. 6. Skin/Wound Care:Routine skin checks 7. Fluids/Electrolytes/Nutrition:Routine in and out's , I on 5/3 8. Atrial fibrillation.Amiodarone 200 mg twice a day1 month.Cardiac rate controlled 9. Hypertension. Coreg 3.125 mg twice a day, Aldactone 12.5 mg daily, Cozaar 12.5 mg daily Vitals:   11/02/18 1939 11/03/18 0537  BP: 114/86 108/61  Pulse: 76 72  Resp: 18 19  Temp: 97.6 F (36.4 C) 97.8 F (36.6 C)  SpO2: 98% 94%   Hold cozaar for systolic< 110  Relatively controlled on 5/4 10. Acute systolic congestive heart failure. Lasix80 mg daily. -Monitor for any signs of fluid overload.  -daily weights  -Follow-upas outpt withcardiology services Filed Weights   11/01/18 0440 11/02/18 0610 11/03/18 0537  Weight: 124.3 kg 122.8 kg 126 kg  Trending down  on 5/4 but creat stable  11.AKI.    Creatinine 1.55 on 4/28, 1.45 today   Encourage fluids   12. Diabetes mellitus. Blood sugar checks currently discontinued. Patient was on Glucophage 250 mg daily prior to admission.  CBG (last 3)  No results for input(s): GLUCAP in the last 72 hours. 13. Leukocytosis.   TFTD32.2 on 4/28, labs ordered for tomorrow  Afebrile  14. COPD/CAP with tobacco abuse. Counseling.Completed course of Zithromax and Rocephin 15. Hyperlipidemia. Lipitor 16. BPH. Flomax   LOS: 7 days A FACE TO FACE EVALUATION WAS PERFORMED  Erick Colace 11/03/2018, 8:40 AM

## 2018-11-03 NOTE — Plan of Care (Signed)
  Problem: RH SKIN INTEGRITY Goal: RH STG SKIN FREE OF INFECTION/BREAKDOWN Description Skin free of infection and breakdown, mod I  Outcome: Progressing   Problem: Consults Goal: RH GENERAL PATIENT EDUCATION Description See Patient Education module for education specifics. Outcome: Progressing

## 2018-11-03 NOTE — Progress Notes (Signed)
Occupational Therapy Session Note  Patient Details  Name: Justin Bradford. MRN: [id number] Date of Birth: [birth year]  Today's Date: 11/03/2018 OT Individual Time: 1331-1500 OT Individual Time Calculation (min): 89 min    Short Term Goals: Week 1:  OT Short Term Goal 1 (Week 1): STG=LTG 2/2 ELOS  Skilled Therapeutic Interventions/Progress Updates:    Pt completed supine to sit and ambulation down to the dayroom with supervision to begin session.  Once in the dayroom, he was able to complete 12 mins total on the Nustep in order to increase his endurance for selfcare tasks.  He was able to perform three, 4 minute intervals with average number of steps on the first at 40-44.  The second and third sets were around 33-35 per minute.  Oxygen sats 97% on room air with HR at 75 BPM.  Once endurance activity was finished, pt ambulated back to the room for toileting and showering with use of the RW during mobility and close supervision.  He was able to complete all aspects of toileting with supervision.  He then transferred over to the shower bench where he completed all bathing with supervision, however he did not attempt washing his lower legs and feet.  He dried off sitting on the seat before transferring out to the EOB with close supervision for dressing tasks.  Supervision for all dressing sit to stand with use of the sockaide for donning his gripper socks.  Finished session with pt in the bed resting and call button and phone in reach.     Therapy Documentation Precautions:  Precautions Precautions: Fall Restrictions Weight Bearing Restrictions: No RUE Weight Bearing: Non weight bearing   Pain: Pain Assessment Pain Scale: Faces Pain Score: 0-No pain ADL: See Care Tool Section for some details of ADL  Therapy/Group: Individual Therapy  Sumayya Muha OTR/L 11/03/2018, 3:47 PM

## 2018-11-04 ENCOUNTER — Inpatient Hospital Stay (HOSPITAL_COMMUNITY): Payer: Medicare Other | Admitting: Occupational Therapy

## 2018-11-04 ENCOUNTER — Inpatient Hospital Stay (HOSPITAL_COMMUNITY): Payer: Self-pay

## 2018-11-04 ENCOUNTER — Telehealth (HOSPITAL_COMMUNITY): Payer: Medicare Other

## 2018-11-04 ENCOUNTER — Inpatient Hospital Stay (HOSPITAL_COMMUNITY): Payer: Self-pay | Admitting: Physical Therapy

## 2018-11-04 NOTE — Progress Notes (Signed)
Physical Therapy Session Note  Patient Details  Name: Justin Bradford. MRN: [id number] Date of Birth: [birth year]  Today's Date: 11/04/2018 PT Individual Time: 4174-0814 PT Individual Time Calculation (min): 57 min   Short Term Goals: Week 1:  PT Short Term Goal 1 (Week 1): STG=LTG due to ELOS  Skilled Therapeutic Interventions/Progress Updates:    Pt supine in bed upon PT arrival, agreeable to therapy tx and denies pain. Pt transferred to sitting EOB with supervision and ambulated to the dayroom x 80 ft with supervision and rollator. Pt transferred onto nustep this session. Pt used nustep this session x 8 minutes on workload level 5 working on cardiovascular endurance and global strengthening. Pt ambulated to the gym with supervision x 150 ft using rollator. Pt worked on dynamic standing balance this session while standing on rockerboard and shifting laterally. Pt worked on cardiovascular endurance with gait this session, ambulated 2 x 212 ft with rollator and supervision. Pt performed x 10 sit<>stands this session from the mat without UE support for LE strengthening. Pt worked on standing balance while performing toe taps this session min assist x 2 trials without UE support. Pt ambulated back to room x 142 ft with rollator and supervision, transferred to bed and to supine. Pt left supine with needs in reach and bed alarm set.   Therapy Documentation Precautions:  Precautions Precautions: Fall Restrictions Weight Bearing Restrictions: No RUE Weight Bearing: Non weight bearing    Therapy/Group: Individual Therapy  Cresenciano Genre, PT, DPT 11/04/2018, 3:36 PM

## 2018-11-04 NOTE — Progress Notes (Signed)
Occupational Therapy Session Note  Patient Details  Name: Justin Bradford. MRN: [id number] Date of Birth: 06/15/1941  Today's Date: 11/04/2018 OT Individual Time: 1230-1316 OT Individual Time Calculation (min): 46 min    Short Term Goals: Week 1:  OT Short Term Goal 1 (Week 1): STG=LTG 2/2 ELOS  Skilled Therapeutic Interventions/Progress Updates:    Pt completed functional mobility with the rollator down to the therapy gym with supervision to start session.  After 2 min rest break, had pt work on balance strategies with use of the Biodex and limits of stability program.  He completed 4 intervals with scores of 18%, 28%, 34%, and 46% demonstrating improvement in ankle strategies with each interval.  Three rest breaks needed to complete each set which lasted between 1-1 1/2 mins.  He ambulated back to the room at completion of Biodex use at supervision level with use of the rollator for support.  Pt left sitting EOB with call button and phone in reach and bed alarm in place, eating his lunch.    Therapy Documentation Precautions:  Precautions Precautions: Fall Restrictions Weight Bearing Restrictions: No RUE Weight Bearing: Non weight bearing  Pain: Pain Assessment Pain Scale: Faces Pain Score: 0-No pain ADL: See Care Tool Section for some details of ADL  Therapy/Group: Individual Therapy  Artis Buechele OTR/L 11/04/2018, 3:35 PM

## 2018-11-04 NOTE — Plan of Care (Signed)
  Problem: RH SKIN INTEGRITY Goal: RH STG SKIN FREE OF INFECTION/BREAKDOWN Description Skin free of infection and breakdown, mod I  Outcome: Progressing   Problem: RH KNOWLEDGE DEFICIT GENERAL Goal: RH STG INCREASE KNOWLEDGE OF SELF CARE AFTER HOSPITALIZATION Description Patient will be able to verbalize self care at home including diet, exercise and medication management of condition with cues/handouts.  Outcome: Progressing   Problem: Consults Goal: RH GENERAL PATIENT EDUCATION Description See Patient Education module for education specifics. Outcome: Progressing   Problem: RH BOWEL ELIMINATION Goal: RH STG MANAGE BOWEL WITH ASSISTANCE Description STG Manage Bowel with Assistance. Mod I  Outcome: Completed/Met

## 2018-11-04 NOTE — Progress Notes (Addendum)
[facility] PHYSICAL MEDICINE & REHABILITATION PROGRESS NOTE   Subjective/Complaints:  Working with therapy this am Good BM    ROS: Denies CP, SOB, N/V/D  Objective:   No results found. Recent Labs    11/03/18 1108  WBC 10.0  HGB 12.8*  HCT 40.1  PLT 450*   Recent Labs    11/03/18 0610 11/03/18 1108  NA  --  136  K  --  3.8  CL  --  100  CO2  --  24  GLUCOSE  --  105*  BUN  --  19  CREATININE 1.45* 1.44*  CALCIUM  --  8.9    Intake/Output Summary (Last 24 hours) at 11/04/2018 0931 Last data filed at 11/04/2018 0300 Gross per 24 hour  Intake 240 ml  Output 600 ml  Net -360 ml     Physical Exam: Vital Signs Blood pressure 100/81, pulse 67, temperature 98.2 F (36.8 C), temperature source Oral, resp. rate 16, weight 126.5 kg, SpO2 93 %. Constitutional: No distress . Vital signs reviewed. HENT: Normocephalic.  Atraumatic. Eyes: EOMI.  No discharge. Cardiovascular: No JVD. Respiratory: Normal effort. GI: Non-distended. Musc: No edema or tenderness in extremities. Neurologic: Alert and oriented Motor: Grossly 4+/5 throughout, unchanged Skin: Warm and dry.  Intact. Psych: Positive and motivated.  Assessment/Plan: 1. Functional deficits secondary to debility which require 3+ hours per day of interdisciplinary therapy in a comprehensive inpatient rehab setting.  Physiatrist is providing close team supervision and 24 hour management of active medical problems listed below.  Physiatrist and rehab team continue to assess barriers to discharge/monitor patient progress toward functional and medical goals  Care Tool:  Bathing    Body parts bathed by patient: Right arm, Left arm, Front perineal area, Abdomen, Chest, Buttocks, Left upper leg, Right upper leg, Face   Body parts bathed by helper: Right lower leg, Left lower leg Body parts n/a: Right lower leg, Left lower leg   Bathing assist Assist Level: Minimal Assistance - Patient > 75%     Upper Body  Dressing/Undressing Upper body dressing   What is the patient wearing?: Pull over shirt    Upper body assist Assist Level: Set up assist    Lower Body Dressing/Undressing Lower body dressing      What is the patient wearing?: Pants     Lower body assist Assist for lower body dressing: Supervision/Verbal cueing     Toileting Toileting    Toileting assist Assist for toileting: Supervision/Verbal cueing     Transfers Chair/bed transfer  Transfers assist     Chair/bed transfer assist level: Supervision/Verbal cueing     Locomotion Ambulation   Ambulation assist      Assist level: Supervision/Verbal cueing Assistive device: Walker-rolling Max distance: 75'   Walk 10 feet activity   Assist     Assist level: Supervision/Verbal cueing Assistive device: Walker-rolling   Walk 50 feet activity   Assist    Assist level: Supervision/Verbal cueing Assistive device: Walker-rolling    Walk 150 feet activity   Assist Walk 150 feet activity did not occur: Safety/medical concerns(fatigue/decreased cardiovascular endurance)  Assist level: Supervision/Verbal cueing Assistive device: Walker-rolling    Walk 10 feet on uneven surface  activity   Assist Walk 10 feet on uneven surfaces activity did not occur: Safety/medical concerns         Wheelchair     Assist Will patient use wheelchair at discharge?: No      Wheelchair assist level: Supervision/Verbal cueing Max wheelchair distance:  34ft     Wheelchair 50 feet with 2 turns activity    Assist            Wheelchair 150 feet activity     Assist           Medical Problem List and Plan: 1.Debilitysecondary to CAD/non-STEMI status post cardiac catheterizationwith stentingcomplicated by hypoxic respiratory failure/CAP/COPD  Continue CIR PT, OT team conf in am 2. Antithrombotics:  -DVT/anticoagulation:Lovenox -antiplatelet therapy: aspirin 81 mg  daily,Brilinta 90 mg twice a day 3. Pain Management:Tylenol as needed 4. Mood:Klonopin 1 mg daily at bedtime, Prozac 60 mg daily,Lamictal 75 mg daily -antipsychotic agents: N/A 5. Neuropsych: This patientiscapable of making decisions on hisown behalf. 6. Skin/Wound Care:Routine skin checks 7. Fluids/Electrolytes/Nutrition:Routine in and out's , I on 5/3 8. Atrial fibrillation.Amiodarone 200 mg twice a day1 month.Cardiac rate controlled 9. Hypertension. Coreg 3.125 mg twice a day, Aldactone 12.5 mg daily, Cozaar 12.5 mg daily Vitals:   11/04/18 0748 11/04/18 0755  BP: 100/81   Pulse: 67   Resp:    Temp:    SpO2: 97% 93%   Hold cozaar for systolic< 110  Relatively controlled on 5/5-  10. Acute systolic congestive heart failure. Lasix80 mg daily. -Monitor for any signs of fluid overload.  -daily weights  -Follow-upas outpt withcardiology services Filed Weights   11/02/18 0610 11/03/18 0537 11/04/18 4431  Weight: 122.8 kg 126 kg 126.5 kg   stable  11.AKI.    Creatinine 1.55 on 4/28, 1.45 today     12. Diabetes mellitus. Blood sugar checks currently discontinued. Patient was on Glucophage 250 mg daily prior to admission. Fasting serum glucose 105 will not resume CBG (last 3)  No results for input(s): GLUCAP in the last 72 hours. 13. Leukocytosis.   Resolved WBC 10.0  Afebrile  14. COPD/CAP with tobacco abuse. Counseling.Completed course of Zithromax and Rocephin 15. Hyperlipidemia. Lipitor 16. BPH. Flomax   LOS: 8 days A FACE TO FACE EVALUATION WAS PERFORMED  Erick Colace 11/04/2018, 9:31 AM

## 2018-11-04 NOTE — Progress Notes (Signed)
Occupational Therapy Session Note  Patient Details  Name: Justin Bradford. MRN: [id number] Date of Birth: 1941-04-23  Today's Date: 11/04/2018 OT Individual Time: 0801-0900 OT Individual Time Calculation (min): 59 min    Short Term Goals: Week 1:  OT Short Term Goal 1 (Week 1): STG=LTG 2/2 ELOS  Skilled Therapeutic Interventions/Progress Updates:    Pt completed breakfast and taking medications for start of session while therapist attempted to make adjustments to his RW.  He then transferred to the EOB with supervision and used the rollator to ambulate down to the therapy gym with supervision.  Once in the therapy gym O2 sats were checked at 94% with HR at 75 BPM.  Next had pt work on dynamic standing balance with use of the foam pad and the rebounder.  He was able to stand on the pad and complete toss and catch with 2.2 lb ball and min assist.  LOB noted when stepping on and off posteriorly as well with occasional catching of the ball.  Rest breaks needed after standing for less than 2 mins each.  Finished session with ambulation back to the room and call button and phone in reach with pt choosing to sit up in the recliner.  Safety alarm in place as well.    Therapy Documentation Precautions:  Precautions Precautions: Fall Restrictions Weight Bearing Restrictions: No RUE Weight Bearing: Non weight bearing  Pain: Pain Assessment Pain Scale: Faces Pain Score: 0-No pain ADL: See care tool section for some details of ADLs  Therapy/Group: Individual Therapy  Justin Bradford OTR/L 11/04/2018, 11:41 AM

## 2018-11-04 NOTE — Progress Notes (Signed)
Pt was tearful during conversation when discussing the passing of his wife. Pt also stated he was upset with him self for getting in "this condition" and continued to say how disappointed his wife would be.  Pt stated he feels like giving up at times. The pt denied having a plan to self harm, followed by stating he wants to prove to him self and the staff here that he is capable of getting better. Will continue to monitor

## 2018-11-04 NOTE — Progress Notes (Signed)
Physical Therapy Session Note  Patient Details  Name: Justin Bradford. MRN: [id number] Date of Birth: 1941-03-05  Today's Date: 11/04/2018 PT Individual Time: 0932-1035 PT Individual Time Calculation (min): 63 min   Short Term Goals: Week 1:  PT Short Term Goal 1 (Week 1): STG=LTG due to ELOS  Skilled Therapeutic Interventions/Progress Updates:    Pt received sitting in recliner and agreeable to therapy session. Pt performed sit<>stand from recliner/EOM/kinetron to rollator with supervision throughout session. Pt ambulated ~126ft using rollator with distant supervision and demonstrating improved gait speed, no LOB, and increased upright posture ambulating with rollator. Performed seated kinetron progressed to standing kinetron with B UE support on 20cm/sec resistance for 3 bouts to patient fatigue (~53minute) with pt reporting muscle fatigue in bilateral gastrocs first - cuing and manual facilitation throughout for improved upright stance, decreased weight bearing through B UEs, and increased R/L lateral weight shifting. Ambulated ~166ft using rollator to other therapy gym with distant supervision. Pt reporting overall fatigue from working on [employer] device. Pt performed 2 bouts of semicircle cone taps with B LEs and mod assist for balance with pt demonstrating decreased stance control and balance when standing on single limb to perform task.Pt demonstrated proper use of rollator breaks for safety throughout session with cues only 1x. Pt ambulated ~180ft from gym to room using rollator with supervision. Pt requesting to use urinal while standing with rollator, continent of urine, supervision for safety. Pt performed sit to supine independently without bed features. Pt left supine in bed with needs in reach and bed alarm on.   Therapy Documentation Precautions:  Precautions Precautions: Fall Restrictions Weight Bearing Restrictions: No RUE Weight Bearing: Non weight bearing  Pain: Reports no  pain during therapy session.  Therapy/Group: Individual Therapy  Ginny Forth, PT, DPT 11/04/2018, 8:46 AM

## 2018-11-05 ENCOUNTER — Inpatient Hospital Stay (HOSPITAL_COMMUNITY): Payer: Self-pay

## 2018-11-05 ENCOUNTER — Encounter (HOSPITAL_COMMUNITY): Payer: Medicare Other | Admitting: Psychology

## 2018-11-05 ENCOUNTER — Inpatient Hospital Stay (HOSPITAL_COMMUNITY): Payer: Medicare Other | Admitting: Occupational Therapy

## 2018-11-05 NOTE — Patient Care Conference (Signed)
Inpatient RehabilitationTeam Conference and Plan of Care Update Date: 11/05/2018   Time: 11:10 AM    Patient Name: Justin Bradford.      Medical Record Number: [id number]  Date of Birth: [birth year] Sex: Male         Room/Bed: 4W15C/4W15C-01 Payor Info: Payor: MEDICARE / Plan: MEDICARE PART A AND B / Product Type: *No Product type* /    Admitting Diagnosis: debility  Admit Date/Time:  10/27/2018  3:19 PM Admission Comments: No comment available   Primary Diagnosis:  <principal problem not specified> Principal Problem: <principal problem not specified>  Patient Active Problem List   Diagnosis Date Noted  . AKI (acute kidney injury) (HCC)   . Acute systolic congestive heart failure (HCC)   . Hypotension due to drugs   . Debility 10/27/2018  . Acute hypoxemic respiratory failure (HCC)   . Myocardial infarction involving left anterior descending (LAD) coronary artery (HCC) 10/21/2018  . Supplemental oxygen dependent   . Essential hypertension   . Steroid-induced hyperglycemia   . Diabetes mellitus type 2 in obese (HCC)   . Tobacco abuse   . Leukocytosis   . Hyponatremia   . Status post coronary artery stent placement   . Acute systolic heart failure (HCC)   . Acute anterior myocardial infarction (HCC)   . Atypical pneumonia   . Elevated troponin   . Chest pain 10/15/2018  . COPD exacerbation (HCC) 10/15/2018  . Left wrist injury 03/22/2016  . Bilateral primary osteoarthritis of knee 03/15/2015    Expected Discharge Date: Expected Discharge Date: 11/07/18  Team Members Present: Physician leading conference: Dr. Claudette Laws Social Worker Present: Dossie Der, LCSW Nurse Present: Otilio Carpen, LPN PT Present: Woodfin Ganja, PT OT Present: Perrin Maltese, OT SLP Present: Colin Benton, SLP PPS Coordinator present : Fae Pippin     Current Status/Progress Goal Weekly Team Focus  Medical   Neuropsych eval  ETOH use, Mod I OT, Sup PT  reduce fall risk , maintain  medical stability  D/C planning   Bowel/Bladder   Continent of B/B LBM 11/03/18  Remain continent   Monitor and assist laxatives PRN   Swallow/Nutrition/ Hydration             ADL's   supervision for all bathing, dressing, toileting, and shower transfers  Mod I  endurance building, balance retraining, selfcare retraining, strengthening, pt education   Mobility   supervision with rollator up to 200 ft, supervision for steps  mod I  balance, strength ,endurance, education, d/c planning   Communication             Safety/Cognition/ Behavioral Observations            Pain   Denies Pain  Free of pain  Assess for pain Q-Shift and PRN, and treat.   Skin   Abd brusing bilateral   Skin free of infection and further breakdown  Assess skin q-shift, and PRN.      *See Care Plan and progress notes for long and short-term goals.     Barriers to Discharge  Current Status/Progress Possible Resolutions Date Resolved   Physician                    Nursing                  PT                    OT  SLP                SW                Discharge Planning/Teaching Needs:  Preparing for home Friday. Doing well and reaching mod/i level goals. Neuro-psych to see today for coping.       Team Discussion:  Progressing toward his goals of mod/i level. His endurance and activity tolerance are improving. He saw neuro-psych today for his anxiety and for coping. Pain issues managed. Will be ready for DC Friday.  Revisions to Treatment Plan:  DC 5/8        I attest that I was present, lead the team conference, and concur with the assessment and plan of the team. Teleconference held due to COVID-19   Yordi Krager, Lemar Livings 11/05/2018, 1:19 PM

## 2018-11-05 NOTE — Plan of Care (Signed)
  Problem: RH SKIN INTEGRITY Goal: RH STG SKIN FREE OF INFECTION/BREAKDOWN Description Skin free of infection and breakdown, mod I  Outcome: Progressing   Problem: RH KNOWLEDGE DEFICIT GENERAL Goal: RH STG INCREASE KNOWLEDGE OF SELF CARE AFTER HOSPITALIZATION Description Patient will be able to verbalize self care at home including diet, exercise and medication management of condition with cues/handouts.  Outcome: Progressing   Problem: Consults Goal: RH GENERAL PATIENT EDUCATION Description See Patient Education module for education specifics. Outcome: Progressing   

## 2018-11-05 NOTE — Progress Notes (Signed)
Physical Therapy Session Note  Patient Details  Name: Justin Bradford. MRN: [id number] Date of Birth: [birth year]  Today's Date: 11/05/2018 PT Individual Time: 0803-0900 and 1017-1100 and 6606-3016 PT Individual Time Calculation (min): 57 min and 43 min and 30 min   Short Term Goals: Week 1:  PT Short Term Goal 1 (Week 1): STG=LTG due to ELOS  Skilled Therapeutic Interventions/Progress Updates:    Session 1:  Pt supine in bed upon PT arrival, agreeable to therapy tx and denies pain. Pt transferred to sitting EOB with supervision and then ambulated x 250 ft to the ortho gym with rollator and supervision. Pt tried narrow rollator this session in case pt is unable to get a wide seat rollator upon d/c. Pt ambulated with regular size rollator and transferred on/off rollator seat with supervision. Pt ambulated to rehab apartment with rollator and supervision, performed transfer on/off recliner with supervision. Pt ambulated to gym with supervision. Pt worked on dynamic standing balance and LE strengthening to perform step ups on aerobic step x 10 leading with R LE and x 10 leading with L LE, min-mod assist without UE support. Pt worked on dynamic standing balance to perform lateral stepping over object without UE support, min assist. Pt worked on dynamic standing balance while ambulating through agility ladder forwards and sideways, min assist. Pt ambulated back to room and left seated in recliner with needs in reach and chair alarm set.   Session 2: Pt seated in recliner upon PT arrival, agreeable to therapy tx and denies pain. Pt ambulated x 120 ft to the dayroom with rollator and supervision. Pt used nustep this session x 8 minutes on workload 6 for LE strengthening and endurance. Pt ambulated to the gym x 150 ft with rollator and supervision. Pt performed LE strengthening exercises this session to review HEP, therapist provided cues for proper technique/form, 2 x 10 each: standing hip abduction,  standing hip extension, mini squats, calf raises and hamstring curls. Therapist provided handout for these exercises and educated pt on frequency to perform these at home. PT ambulated back to room with supervision and transferred to bed. Pt left supine in bed with needs in reach and bed alarm set.   Session 3: Pt supine in bed upon PT arrival, agreeable to therapy tx and denies pain. Pt transferred to sitting EOB with supervision and donned socks with supervision. Pt ambulated to the dayroom with rollator and supervision, pt asking to weight himself this session. Pt stepped on/off scale with supervision to get his weight. Pt ambulated to the gym x 150 ft with rollator and supervision. Pt worked on dynamic balance to perform toe taps to colored cones and to perform ambulation without AD x 50 ft. Pt ambulated back to room with rollator and supervision x 140 ft. Pt transferred to bed and left supine with needs in reach.    Therapy Documentation Precautions:  Precautions Precautions: Fall Restrictions Weight Bearing Restrictions: No RUE Weight Bearing: Non weight bearing   Therapy/Group: Individual Therapy  Cresenciano Genre, PT, DPT 11/05/2018, 7:54 AM

## 2018-11-05 NOTE — Progress Notes (Signed)
[facility] PHYSICAL MEDICINE & REHABILITATION PROGRESS NOTE   Subjective/Complaints:  Patient had good session in therapy this morning, also discussed his meeting with neuropsychology.  Patient states he is committed to making positive health and lifestyle choices.    ROS: Denies CP, SOB, N/V/D  Objective:   No results found. Recent Labs    11/03/18 1108  WBC 10.0  HGB 12.8*  HCT 40.1  PLT 450*   Recent Labs    11/03/18 0610 11/03/18 1108  NA  --  136  K  --  3.8  CL  --  100  CO2  --  24  GLUCOSE  --  105*  BUN  --  19  CREATININE 1.45* 1.44*  CALCIUM  --  8.9    Intake/Output Summary (Last 24 hours) at 11/05/2018 5188 Last data filed at 11/04/2018 2035 Gross per 24 hour  Intake 120 ml  Output 350 ml  Net -230 ml     Physical Exam: Vital Signs Blood pressure 108/72, pulse 75, temperature (!) 97.5 F (36.4 C), temperature source Oral, resp. rate 18, weight 124.1 kg, SpO2 96 %. Constitutional: No distress . Vital signs reviewed. HENT: Normocephalic.  Atraumatic. Eyes: EOMI.  No discharge. Cardiovascular: No JVD. Respiratory: Normal effort. GI: Non-distended. Musc: No edema or tenderness in extremities. Neurologic: Alert and oriented Motor: Grossly 4+/5 throughout, unchanged Skin: Warm and dry.  Intact. Psych: Positive and motivated.  Assessment/Plan: 1. Functional deficits secondary to debility which require 3+ hours per day of interdisciplinary therapy in a comprehensive inpatient rehab setting.  Physiatrist is providing close team supervision and 24 hour management of active medical problems listed below.  Physiatrist and rehab team continue to assess barriers to discharge/monitor patient progress toward functional and medical goals  Care Tool:  Bathing    Body parts bathed by patient: Right arm, Left arm, Front perineal area, Abdomen, Chest, Buttocks, Left upper leg, Right upper leg, Face   Body parts bathed by helper: Right lower leg, Left lower  leg Body parts n/a: Right lower leg, Left lower leg   Bathing assist Assist Level: Minimal Assistance - Patient > 75%     Upper Body Dressing/Undressing Upper body dressing   What is the patient wearing?: Pull over shirt    Upper body assist Assist Level: Set up assist    Lower Body Dressing/Undressing Lower body dressing      What is the patient wearing?: Pants     Lower body assist Assist for lower body dressing: Supervision/Verbal cueing     Toileting Toileting    Toileting assist Assist for toileting: Supervision/Verbal cueing     Transfers Chair/bed transfer  Transfers assist     Chair/bed transfer assist level: Supervision/Verbal cueing     Locomotion Ambulation   Ambulation assist      Assist level: Supervision/Verbal cueing Assistive device: Rollator Max distance: 127f   Walk 10 feet activity   Assist     Assist level: Supervision/Verbal cueing Assistive device: Rollator   Walk 50 feet activity   Assist    Assist level: Supervision/Verbal cueing Assistive device: Rollator    Walk 150 feet activity   Assist Walk 150 feet activity did not occur: Safety/medical concerns(fatigue/decreased cardiovascular endurance)  Assist level: Supervision/Verbal cueing Assistive device: Rollator    Walk 10 feet on uneven surface  activity   Assist Walk 10 feet on uneven surfaces activity did not occur: Safety/medical concerns         Wheelchair  Assist Will patient use wheelchair at discharge?: No      Wheelchair assist level: Supervision/Verbal cueing Max wheelchair distance: 54f     Wheelchair 50 feet with 2 turns activity    Assist            Wheelchair 150 feet activity     Assist           Medical Problem List and Plan: 1.Debilitysecondary to CAD/non-STEMI status post cardiac catheterizationwith stentingcomplicated by hypoxic respiratory failure/CAP/COPD  Continue CIR PT, OT , Team  conference today please see physician documentation under team conference tab, met with team face-to-face to discuss problems,progress, and goals. Formulized individual treatment plan based on medical history, underlying problem and comorbidities. 2. Antithrombotics:  -DVT/anticoagulation:Lovenox -antiplatelet therapy: aspirin 81 mg daily,Brilinta 90 mg twice a day 3. Pain Management:Tylenol as needed 4. Mood:Klonopin 1 mg daily at bedtime, Prozac 60 mg daily,Lamictal 75 mg daily -antipsychotic agents: N/A 5. Neuropsych: This patientiscapable of making decisions on hisown behalf. 6. Skin/Wound Care:Routine skin checks 7. Fluids/Electrolytes/Nutrition:Routine in and out's , I 6045mon 5/3 8. Atrial fibrillation.Amiodarone 200 mg twice a day1 month.Cardiac rate controlled 9. Hypertension. Coreg 3.125 mg twice a day, Aldactone 12.5 mg daily, Cozaar 12.5 mg daily Vitals:   11/05/18 0559 11/05/18 0751  BP: 108/72   Pulse: 70 75  Resp: 18 18  Temp: (!) 97.5 F (36.4 C)   SpO2: 96%    Hold cozaar for systolic< 11924has been held once in the last 4 days  Relatively controlled on 5/5-  10. Acute systolic congestive heart failure. Lasix80 mg daily. -Monitor for any signs of fluid overload.  -daily weights  -Follow-upas outpt withcardiology services Filed Weights   11/03/18 0537 11/04/18 0608 11/05/18 0500  Weight: 126 kg 126.5 kg 124.1 kg   stable  11.AKI.    Creatinine 1.55 on 4/28, 1.45 on 5/4    12. Diabetes mellitus. Blood sugar checks currently discontinued. Patient was on Glucophage 250 mg daily prior to admission. Fasting serum glucose 105 will not resume CBG (last 3)  No results for input(s): GLUCAP in the last 72 hours. 13. Leukocytosis.   Resolved WBC 10.0  Afebrile  14. COPD/CAP with tobacco abuse. Counseling.Completed course of Zithromax and Rocephin May discontinue IV 15. Hyperlipidemia.  Lipitor 16. BPH. Flomax   LOS: 9 days [place]/12/2018, 9:22 AM

## 2018-11-05 NOTE — Progress Notes (Signed)
Occupational Therapy Session Note  Patient Details  Name: Justin Bradford. MRN: [id number] Date of Birth: [birth year]  Today's Date: 11/05/2018 OT Individual Time: 1624-4695 OT Individual Time Calculation (min): 60 min    Short Term Goals: Week 1:  OT Short Term Goal 1 (Week 1): STG=LTG 2/2 ELOS  Skilled Therapeutic Interventions/Progress Updates:    Pt completed toileting, bathing, and dressing during session.  He was able to ambulate to the bathroom with supervision without use of an assistive device.  He completed all aspects of toileting as well as bathing with supervision sit to stand.  He then completed dressing sit to stand at the EOB with supervision as well.  Min instructional cueing for rest breaks and to not try and stand to dry off secondary to balance and dyspnea.  Call button and phone in reach with bed alarm in place.    Therapy Documentation Precautions:  Precautions Precautions: Fall Restrictions Weight Bearing Restrictions: No RUE Weight Bearing: Non weight bearing  Pain: Pain Assessment Pain Scale: Faces Pain Score: 0-No pain ADL: See Care Tool Section for some details of ADL  Therapy/Group: Individual Therapy  Demri Poulton OTR/L 11/05/2018, 3:45 PM

## 2018-11-05 NOTE — Progress Notes (Signed)
Social Work Patient ID: Justin Rout., male   DOB: 11/20/1940, 78 y.o.   MRN: [id number]  Met with pt to inform team conference progress toward his goals of mod/i level and discharge 5/8. He feels ready to go home Friday. Will order equipment and follow up home health.

## 2018-11-06 ENCOUNTER — Inpatient Hospital Stay (HOSPITAL_COMMUNITY): Payer: Medicare Other | Admitting: Occupational Therapy

## 2018-11-06 ENCOUNTER — Inpatient Hospital Stay (HOSPITAL_COMMUNITY): Payer: Self-pay | Admitting: Physical Therapy

## 2018-11-06 MED ORDER — LAMOTRIGINE 25 MG PO TABS: 75.0000 mg | ORAL_TABLET | Freq: Every day | ORAL | 0 refills | Status: DC

## 2018-11-06 MED ORDER — FLUTICASONE FUROATE-VILANTEROL 200-25 MCG/INH IN AEPB: 1.0000 | INHALATION_SPRAY | Freq: Every day | RESPIRATORY_TRACT | 0 refills | Status: AC

## 2018-11-06 MED ORDER — TICAGRELOR 90 MG PO TABS: 90.0000 mg | ORAL_TABLET | Freq: Two times a day (BID) | ORAL | 0 refills | Status: AC

## 2018-11-06 MED ORDER — FUROSEMIDE 80 MG PO TABS: 80.0000 mg | ORAL_TABLET | Freq: Every day | ORAL | 0 refills | Status: DC

## 2018-11-06 MED ORDER — METFORMIN HCL 500 MG PO TABS: 250.0000 mg | ORAL_TABLET | ORAL | 0 refills | Status: AC

## 2018-11-06 MED ORDER — ATORVASTATIN CALCIUM 80 MG PO TABS: 80.0000 mg | ORAL_TABLET | Freq: Every day | ORAL | 0 refills | Status: AC

## 2018-11-06 MED ORDER — AMIODARONE HCL 200 MG PO TABS: 200.0000 mg | ORAL_TABLET | Freq: Every day | ORAL | 0 refills | Status: DC

## 2018-11-06 MED ORDER — ACETAMINOPHEN 325 MG PO TABS: 650.0000 mg | ORAL_TABLET | ORAL | Status: AC | PRN

## 2018-11-06 MED ORDER — CLONAZEPAM 1 MG PO TABS: 1.0000 mg | ORAL_TABLET | Freq: Every day | ORAL | 0 refills | Status: AC

## 2018-11-06 MED ORDER — SPIRONOLACTONE 25 MG PO TABS: 12.5000 mg | ORAL_TABLET | Freq: Every day | ORAL | 0 refills | Status: DC

## 2018-11-06 MED ORDER — LOSARTAN POTASSIUM 25 MG PO TABS: 12.5000 mg | ORAL_TABLET | Freq: Every day | ORAL | 0 refills | Status: DC

## 2018-11-06 MED ORDER — FLUOXETINE HCL 20 MG PO CAPS: 60.0000 mg | ORAL_CAPSULE | Freq: Every day | ORAL | 0 refills | Status: AC

## 2018-11-06 MED ORDER — PANTOPRAZOLE SODIUM 40 MG PO TBEC: 40.0000 mg | DELAYED_RELEASE_TABLET | Freq: Every day | ORAL | 0 refills | Status: DC

## 2018-11-06 MED ORDER — CARVEDILOL 3.125 MG PO TABS: 3.1250 mg | ORAL_TABLET | Freq: Two times a day (BID) | ORAL | 0 refills | Status: DC

## 2018-11-06 MED ORDER — PANTOPRAZOLE SODIUM 40 MG PO TBEC: 40.0000 mg | DELAYED_RELEASE_TABLET | Freq: Every day | ORAL | 0 refills | Status: AC

## 2018-11-06 MED ORDER — POTASSIUM CHLORIDE CRYS ER 20 MEQ PO TBCR: 40.0000 meq | EXTENDED_RELEASE_TABLET | Freq: Every day | ORAL | 0 refills | Status: DC

## 2018-11-06 MED ORDER — TAMSULOSIN HCL 0.4 MG PO CAPS: 0.4000 mg | ORAL_CAPSULE | Freq: Every day | ORAL | 0 refills | Status: AC

## 2018-11-06 NOTE — Plan of Care (Signed)
  Problem: RH SKIN INTEGRITY Goal: RH STG SKIN FREE OF INFECTION/BREAKDOWN Description Skin free of infection and breakdown, mod I  Outcome: Progressing   Problem: RH KNOWLEDGE DEFICIT GENERAL Goal: RH STG INCREASE KNOWLEDGE OF SELF CARE AFTER HOSPITALIZATION Description Patient will be able to verbalize self care at home including diet, exercise and medication management of condition with cues/handouts.  Outcome: Progressing   Problem: Consults Goal: RH GENERAL PATIENT EDUCATION Description See Patient Education module for education specifics. Outcome: Progressing

## 2018-11-06 NOTE — Discharge Summary (Signed)
Physician Discharge Summary  Patient ID: Justin Bradford. MRN: [id number] DOB/AGE: Sep 05, 1940 78 y.o.  Admit date: 10/27/2018 Discharge date: 11/07/2018  Discharge Diagnoses:  Active Problems:   Debility   AKI (acute kidney injury) (HCC)   Acute systolic congestive heart failure (HCC)   Hypotension due to drugs DVT prophylaxis Mood stabilization Diabetes mellitus COPD with tobacco abuse Hyperlipidemia BPH  Discharged Condition: Stable  Significant Diagnostic Studies: Dg Chest 2 View  Result Date: 10/15/2018 CLINICAL DATA:  Chest pain and sore throat EXAM: CHEST - 2 VIEW COMPARISON:  None. FINDINGS: The lungs are hyperinflated with diffuse interstitial prominence. No focal airspace consolidation or pulmonary edema. No pleural effusion or pneumothorax. Normal cardiomediastinal contours. Right basilar atelectasis. IMPRESSION: COPD without acute airspace disease. Electronically Signed   By: Deatra Robinson M.D.   On: 10/15/2018 18:21   Dg Abd 1 View  Result Date: 10/23/2018 CLINICAL DATA:  Abdominal pain, hypoxia EXAM: ABDOMEN - 1 VIEW COMPARISON:  None. FINDINGS: Mild diffuse gaseous distention of bowel. No evidence of obstruction, organomegaly or free air. No suspicious calcification. Bibasilar atelectasis. IMPRESSION: Mild diffuse gaseous distention of bowel. No evidence for obstruction or free air. Electronically Signed   By: Charlett Nose M.D.   On: 10/23/2018 02:20   Ct Chest Wo Contrast  Result Date: 10/23/2018 CLINICAL DATA:  Pneumonia. EXAM: CT CHEST WITHOUT CONTRAST TECHNIQUE: Multidetector CT imaging of the chest was performed following the standard protocol without IV contrast. COMPARISON:  Radiograph of same day.  CT scan of October 15, 2018. FINDINGS: Cardiovascular: Atherosclerosis of thoracic aorta is noted. 4.3 cm ascending thoracic aortic aneurysm is noted. Mild cardiomegaly is noted. No pericardial effusion is noted. Coronary artery calcifications are noted.  Mediastinum/Nodes: No enlarged mediastinal or axillary lymph nodes. Thyroid gland, trachea, and esophagus demonstrate no significant findings. Lungs/Pleura: No pneumothorax is noted. Small minimal pleural effusions are noted bilaterally with adjacent subsegmental atelectasis. Increased interstitial opacities are noted in the right upper lobe concerning for atypical pneumonia. New multiple opacities are noted in the left upper lobe concerning for pneumonia. Upper Abdomen: No acute abnormality. Musculoskeletal: No chest wall mass or suspicious bone lesions identified. IMPRESSION: New large interstitial opacity is noted in the right upper lobe, as well as multiple smaller opacities are noted in the left upper lobe. These findings are concerning for multifocal interstitial or atypical pneumonia. Minimal bilateral pleural effusions are noted with adjacent subsegmental atelectasis. 4.3 cm ascending thoracic aortic aneurysm. Recommend annual imaging followup by CTA or MRA. This recommendation follows 2010 ACCF/AHA/AATS/ACR/ASA/SCA/SCAI/SIR/STS/SVM Guidelines for the Diagnosis and Management of Patients with Thoracic Aortic Disease. Circulation. 2010; 121: L244-W102. Aortic aneurysm NOS (ICD10-I71.9). Coronary artery calcifications are noted suggesting coronary artery disease. Aortic Atherosclerosis (ICD10-I70.0). Electronically Signed   By: Lupita Raider M.D.   On: 10/23/2018 13:26   Ct Angio Chest Pe W And/or Wo Contrast  Result Date: 10/15/2018 CLINICAL DATA:  Chest pain shortness of breath EXAM: CT ANGIOGRAPHY CHEST WITH CONTRAST TECHNIQUE: Multidetector CT imaging of the chest was performed using the standard protocol during bolus administration of intravenous contrast. Multiplanar CT image reconstructions and MIPs were obtained to evaluate the vascular anatomy. CONTRAST:  OMNIPAQUE 350 COMPARISON:  10/15/2018 FINDINGS: Cardiovascular: Thoracic aortic calcifications are noted without aneurysmal dilatation.  Diffuse coronary calcifications are seen. The pulmonary artery shows a normal branching pattern without evidence of filling defect to suggest pulmonary embolism. Mediastinum/Nodes: Thoracic inlet is within normal limits. No hilar or mediastinal adenopathy is noted. The esophagus as visualized  is within normal limits. Lungs/Pleura: Lungs are well aerated bilaterally. Mild dependent atelectatic changes are seen. Scattered ground-glass densities are noted worst in the right upper lobe. Mild bronchial wall thickening is noted particularly in the lower lobes. No sizable parenchymal nodules are seen. No pleural effusion is noted. Upper Abdomen: Visualized upper abdomen is within normal limits. Musculoskeletal: Degenerative changes of the thoracic spine are seen without acute abnormality. Review of the MIP images confirms the above findings. IMPRESSION: No evidence of pulmonary embolism. Predominately lower lobe bronchial thickening with dependent atelectatic changes. Some scattered ground-glass changes are noted predominately within the right upper lobe posteriorly. These likely represent atypical pneumonia. Aortic Atherosclerosis (ICD10-I70.0). Electronically Signed   By: Alcide Clever M.D.   On: 10/15/2018 20:27   Dg Chest Port 1 View  Result Date: 10/23/2018 CLINICAL DATA:  Abdominal pain, hypoxia EXAM: PORTABLE CHEST 1 VIEW COMPARISON:  10/20/2018 FINDINGS: Right PICC line remains in place, unchanged. Cardiomegaly. Right greater than left airspace opacities are again noted. Some improvement on the left since prior study. There is also been improvement in the right lower lung lobe since prior study. Stable consolidation in the right upper lobe. No visible effusions or acute bony abnormality. IMPRESSION: Bilateral airspace opacities, right greater than left, improving since prior study. Cardiomegaly. Electronically Signed   By: Charlett Nose M.D.   On: 10/23/2018 02:21   Dg Chest Port 1 View  Result Date:  10/20/2018 CLINICAL DATA:  Shortness of breath.  Respiratory failure. EXAM: PORTABLE CHEST 1 VIEW COMPARISON:  October 19, 2018 FINDINGS: Right greater than left asymmetric pulmonary infiltrates are stable. A right PICC line terminates in the central SVC, new in the interval. No pneumothorax. No change in the cardiomediastinal silhouette. IMPRESSION: 1. The new right PICC line terminates in the central SVC. 2. Right greater than left pulmonary infiltrates are stable. 3. No other changes. Electronically Signed   By: Gerome Sam III M.D   On: 10/20/2018 10:01   Dg Chest Port 1 View  Result Date: 10/19/2018 CLINICAL DATA:  Shortness of breath. EXAM: PORTABLE CHEST 1 VIEW COMPARISON:  10/18/2018 FINDINGS: The cardiac silhouette remains mildly enlarged. They are worsening airspace opacities throughout the right lung. Left basilar airspace opacities are unchanged. Small pleural effusions are not excluded. No pneumothorax is identified. IMPRESSION: Worsening asymmetric airspace disease throughout the right lung. Electronically Signed   By: Sebastian Ache M.D.   On: 10/19/2018 10:19   Dg Chest Port 1 View  Result Date: 10/18/2018 CLINICAL DATA:  Acute systolic heart failure. EXAM: PORTABLE CHEST 1 VIEW COMPARISON:  10/16/2018 FINDINGS: The cardiac silhouette remains mildly enlarged. Right midlung opacity appears more confluent compared to the prior study. Patchy bibasilar opacities have not substantially changed. No large pleural effusion or pneumothorax is identified. IMPRESSION: Bilateral airspace opacities with interval worsening in the right midlung. Electronically Signed   By: Sebastian Ache M.D.   On: 10/18/2018 08:18   Dg Chest Portable 1 View  Result Date: 10/16/2018 CLINICAL DATA:  78 y/o M; increased shortness of breath and hypoxia. Possible COVID-19. EXAM: PORTABLE CHEST 1 VIEW COMPARISON:  10/15/2018 CT chest and chest radiograph. FINDINGS: Stable enlarged cardiac silhouette given projection and  technique. Aortic calcific atherosclerosis. Patchy pulmonary opacities are increased, greater on the right. No pleural effusion or pneumothorax. Bones are unremarkable. IMPRESSION: Increased patchy pulmonary opacities, greater on the right. Electronically Signed   By: Mitzi Hansen M.D.   On: 10/16/2018 02:45   Vas [place] Vanice Sarah With/wo Tbi  Result Date: 10/18/2018 LOWER EXTREMITY DOPPLER STUDY Indications: Peripheral artery disease, and Decreased pedal pulses.  Performing Technologist: Milta Deiters, [place] [facility]  Examination Guidelines: A complete evaluation includes at minimum, Doppler waveform signals and systolic blood pressure reading at the level of bilateral brachial, anterior tibial, and posterior tibial arteries, when vessel segments are accessible. Bilateral testing is considered an integral part of a complete examination. Photoelectric Plethysmograph (PPG) waveforms and toe systolic pressure readings are included as required and additional duplex testing as needed. Limited examinations for reoccurring indications may be performed as noted.  ABI Findings: +--------+------------------+-----+---------+--------+ Right   Rt Pressure (mmHg)IndexWaveform Comment  +--------+------------------+-----+---------+--------+ [id number]                    triphasic         +--------+------------------+-----+---------+--------+ PTA     121               1.05 triphasic         +--------+------------------+-----+---------+--------+ DP      104               0.90 triphasic         +--------+------------------+-----+---------+--------+ +--------+------------------+-----+---------+----------------------------------+ Left    Lt Pressure (mmHg)IndexWaveform Comment                            +--------+------------------+-----+---------+----------------------------------+ [id number]                    triphasic                                    +--------+------------------+-----+---------+----------------------------------+ PTA     107               0.93 triphasic                                   +--------+------------------+-----+---------+----------------------------------+ DP      80                0.70 triphasicDifficult to evaluate due to                                               diminished Doppler sound           +--------+------------------+-----+---------+----------------------------------+  Summary: Right: Resting right ankle-brachial index is within normal range. No evidence of significant right lower extremity arterial disease. Left: Resting left ankle-brachial index indicates mild left lower extremity arterial disease.  *See table(s) above for measurements and observations.  Electronically signed by Lemar Livings MD on 10/18/2018 at 10:26:07 AM.   Final    [place] Ekg Site Rite  Result Date: 10/19/2018 If Site Rite image not attached, placement could not be confirmed due to current cardiac rhythm.   Labs:  Basic Metabolic Panel: Recent Labs  Lab 11/03/18 0610 11/03/18 1108  NA  --  136  K  --  3.8  CL  --  100  CO2  --  24  GLUCOSE  --  105*  BUN  --  19  CREATININE 1.45* 1.44*  CALCIUM  --  8.9    CBC: Recent Labs  Lab 11/03/18 1108  WBC 10.0  NEUTROABS 7.2  HGB 12.8*  HCT 40.1  MCV 92.8  PLT 450*    CBG: No results for input(s): GLUCAP in the last 168 hours. Family history.  Mother and father with hypertension.  Noted family history of diabetes.  Denies cancer  Brief HPI:    Dellis AnesBruce D Bialy is a 78 year old right-handed male history of hypertension, diabetes mellitus tobacco abuse.  Lives independently no local family 1 level home.  Reportedly independent prior to admission.  Presented 10/15/2018 with chest pain shortness of breath work-up revealed troponin to be mildly elevated trending up to 12.54, lactic acid 1.7 leukocytosis 24,600.  Chest x-ray COPD without acute airspace disease.   Blood cultures no growth to date.  CT angiogram of the chest no evidence of pulmonary emboli.  EKG showed mild ST elevation in the anterior leads suspect non-STEMI.  Echocardiogram ejection fraction 35% moderate to severe reduced systolic function.  Cardiac catheterization completed 10/16/2018 with total occlusion LAD and underwent stent placement.  Required norepinephrine for blood pressure control as well as IV Lasix bouts of atrial fibrillation requiring amiodarone.  Maintained on IV heparin transitioned to Brilinta as well as low-dose aspirin with subcutaneous Lovenox.  Hospital course compromised by hypoxic respiratory failure right greater than left pulmonary infiltrate suspect community-acquired pneumonia coverage 5-day total antibiotics.  Therapy evaluations completed and patient was admitted for a comprehensive rehab program  Hospital Course: Justin PoseyBruce D Bogucki Jr. was admitted to rehab 10/27/2018 for inpatient therapies to consist of PT, ST and OT at least three hours five days a week. Past admission physiatrist, therapy team and rehab RN have worked together to provide customized collaborative inpatient rehab.  Pertaining to patient's overall debility related to CAD non-STEMI he had underwent cardiac catheterization with follow-up with cardiology services.  He was attending full therapies.  Patient remained on Brilinta and low-dose aspirin no chest pain or increasing shortness of breath tapered from oxygen.  Mood stabilization with Klonopin Prozac and Lamictal with emotional support provided.  Blood pressures overall controlled heart rate monitored he remained on amiodarone as well as Coreg Aldactone and Cozaar again following up with cardiology services.  He exhibited no other signs of fluid overload.  Blood sugars monitored for diabetic teaching he would follow-up with primary MD and resume low dose glucophage.Marland Kitchen.  He did have a history of tobacco abuse receiving full counsel regarding cessation of nicotine  products.  He completed a course of Zithromax Rocephin for community-acquired pneumonia remained afebrile.  Lipitor ongoing for hyperlipidemia.  Physical exam.  Blood pressure 94/62 pulse 88 temperature 98.7 respirations 22 oxygen saturations 94% room air Constitutional.  Well-developed well-nourished HEENT Head.  Normocephalic and atraumatic Eyes.  Pupils round and reactive to light EOMs normal no discharge Neck supple nontender no JVD without thyromegaly Cardiovascular normal rate and rhythm no friction rub or murmur heard Respiratory.  Effort normal no distress without wheeze GI soft no distention nontender without rebound Musculoskeletal normal range of motion Neurological.  Alert provides name age date of birth follows commands upper extremity strength 4 out of 5 proximal to distal left lower extremity 3+ out of 5 hip flexors 4- knee extension 4 out of 5 ankle dorsiflexion  Rehab course: During patient's stay in rehab weekly team conferences were held to monitor patient's progress, set goals and discuss barriers to discharge. At admission, patient required minimal guard sit to stand, minimal assist supine to sit, minimal guard 110 feet rolling walker.  Minimal assist upper body bathing moderate assist lower body bathing minimal assist  upper body dressing moderate assist lower body dressing  He/ has had improvement in activity tolerance, balance, postural control as well as ability to compensate for deficits. He has had improvement in functional use RUE/LUE  and RLE/LLE as well as improvement in awareness.  Working with energy conservation techniques.  Transferred to sitting edge of bed supervision ambulates 200 feet with rolling walker and supervision.  Ambulates to the rehab apartment with Rollator walker and supervision.  Working with dynamic standing balance.  It was advised no driving.  He can gather his belongings for activities daily living and homemaking.  All teaching completed and plan  discharged to home       Disposition: Discharge disposition: 01-Home or Self Care     Discharge to home   Diet: Regular consistency diet  Special Instructions: No driving smoking or alcohol  Medications at discharge 1 Tylenol as needed 2 amiodarone 200 mg p.o. daily 3.  Aspirin 81 mg p.o. daily 4.  Lipitor 80 mg p.o. daily 5.  Coreg 3.125 mg p.o. twice daily 6.  Klonopin 1 mg nightly 7.  Prozac 60 mg daily 8.breo-ellipta 1 puff daily 9.  Lasix 80 mg p.o. daily 10.  Lamictal 75 mg nightly 11.  Cozaar 12.5 mg p.o. daily 12.  Protonix 40 mg p.o. daily 13.  Potassium chloride 40 mEq p.o. daily 14.  Aldactone 12.5 mg p.o. daily 15.  Flomax 0.4 mg nightly 16.  Brilinta 90 mg twice daily 17.Glucphage 250 mg daily  Follow-up Information    Kirsteins, Victorino Sparrow, MD Follow up.   Specialty:  Physical Medicine and Rehabilitation Why:  only as needed Contact information: [address] [place] [address] [phone]        Tonny Bollman, MD Follow up.   Specialty:  Cardiology Why:  call for appointment Contact information: [address] [place] [address] [phone]        Forrest Moron, MD Follow up on 11/18/2018.   Specialty:  Internal Medicine Why:  APPOINTMENT @ 2:30 pm Contact information: [address] [place] [address] [phone]           Signed: Mcarthur Rossetti Kawena Lyday 11/07/2018, 5:32 AM

## 2018-11-06 NOTE — Progress Notes (Signed)
Occupational Therapy Discharge Summary  Patient Details  Name: Justin Bradford. MRN: [id number] Date of Birth: [birth year]  Today's Date: 11/06/2018 OT Individual Time: 6067-7034 OT Individual Time Calculation (min): 64 min   Session Note:  Pt completed toileting and hand washing with modified independence using the rollator for support.  He still demonstrates dyspnea after completion of task, requiring frequent rest breaks.  Educated pt on completion of BUE therex exercises for the arms and provided handout for reference.  He was able to return demonstrate 1 set of 10 repetitions for shoulder flexion, elbow flexion, elbow extension, and shoulder horizontal abduction.  He was able to use level 1 resistance band for the shoulder exercises and level 2 for the elbow exercises.  Finished session with pt in the bedside recliner with call button and phone in reach.  Pt currently now modified independent in the room.    Patient has met 10 of 10 long term goals due to improved activity tolerance, improved balance, postural control and ability to compensate for deficits.  Patient to discharge at overall Modified Independent level.  Reasons goals not met:   Recommendation:  Patient will benefit from ongoing skilled OT services in home health setting to continue to advance functional skills in the area of BADL.  Pt performs selfcare tasks at modified independent level for selfcare tasks and functional transfers with use of DME.  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    Equipment: rollator, tub bench, 3:1  Reasons for discharge: treatment goals met and discharge from hospital  Patient/family agrees with progress made and goals achieved: Yes  OT Discharge Precautions/Restrictions   Precautions Precautions: Fall Restrictions Weight Bearing Restrictions: No RUE Weight Bearing: Non weight bearing   Pain Pain Assessment Pain Scale: Faces Pain Score: 0-No pain ADL ADL Eating: Independent Where Assessed-Eating: Chair Grooming: Independent Where Assessed-Grooming: Standing at sink Upper Body Bathing: Modified independent Where Assessed-Upper Body Bathing: Shower Lower Body Bathing: Modified independent Where Assessed-Lower Body Bathing: Shower Upper Body Dressing: Independent Where Assessed-Upper Body Dressing: Sitting at sink Lower Body Dressing: Modified independent Where Assessed-Lower Body Dressing: Chair Toileting: Modified independent Where Assessed-Toileting: Risk analyst Method: Counselling psychologist: Raised toilet seat, Grab bars Tub/Shower Transfer: Modified independent Clinical cytogeneticist Method: Optometrist: Facilities manager: Modified independent Social research officer, government Method: Heritage manager: Gaffer Baseline Vision/History: Wears glasses Vision Assessment?: No apparent visual deficits Perception  Perception: Within Functional Limits Praxis Praxis: Intact Cognition Overall Cognitive Status: Within Functional Limits for tasks assessed Arousal/Alertness: Awake/alert Orientation Level: Oriented X4 Attention: Focused;Sustained;Selective Focused Attention: Appears intact Sustained Attention: Appears intact Memory: Appears intact Awareness: Appears intact Safety/Judgment: Appears intact Sensation Sensation Light Touch: Appears Intact Hot/Cold: Appears Intact Proprioception: Appears Intact Stereognosis: Appears Intact Additional Comments: BUE sensation intact Coordination Gross Motor Movements are Fluid and Coordinated: Yes Fine Motor Movements are Fluid and Coordinated:  Yes Coordination and Movement Description: BUE coordination and speed [place] Motor  Motor Motor: Within Functional Limits Motor - Discharge Observations: Still with generalized weakness Mobility  Bed Mobility Bed Mobility: Rolling Right;Rolling Left;Supine to Sit;Sit to Supine Rolling Right: Independent Rolling Left: Independent Supine to Sit: Independent Sit to Supine: Independent Transfers Sit to Stand: Independent with  assistive device Stand to Sit: Independent with assistive device  Trunk/Postural Assessment  Cervical Assessment Cervical Assessment: Exceptions to WFL(slight forward head posture) Thoracic Assessment Thoracic Assessment: Exceptions to WFL(slightly rounded thoracic region) Lumbar Assessment Lumbar Assessment: Exceptions to WFL(sits in posterior pelvic tilt secondary to weakness)  Balance Balance Balance Assessed: Yes Static Sitting Balance Static Sitting - Balance Support: Feet supported Static Sitting - Level of Assistance: 7: Independent Dynamic Sitting Balance Dynamic Sitting - Balance Support: Feet supported Dynamic Sitting - Level of Assistance: 7: Independent Static Standing Balance Static Standing - Balance Support: During functional activity Static Standing - Level of Assistance: 6: Modified independent (Device/Increase time) Dynamic Standing Balance Dynamic Standing - Balance Support: During functional activity;Bilateral upper extremity supported Dynamic Standing - Level of Assistance: 6: Modified independent (Device/Increase time) Extremity/Trunk Assessment RUE Assessment RUE Assessment: Within Functional Limits General Strength Comments: strength 4/5 throughout LUE Assessment LUE Assessment: Within Functional Limits General Strength Comments: strength 4/5 throughout   Amairani Shuey OTR/L 11/06/2018, 4:22 PM

## 2018-11-06 NOTE — Discharge Instructions (Signed)
Inpatient Rehab Discharge Instructions  Justin Bradford. Discharge date and time: No discharge date for patient encounter.   Activities/Precautions/ Functional Status: Activity: activity as tolerated Diet: cardiac diet Wound Care: none needed Functional status:  ___ No restrictions     ___ Walk up steps independently ___ 24/7 supervision/assistance   ___ Walk up steps with assistance ___ Intermittent supervision/assistance  ___ Bathe/dress independently ___ Walk with walker     _x__ Bathe/dress with assistance ___ Walk Independently    ___ Shower independently ___ Walk with assistance    ___ Shower with assistance ___ No alcohol     ___ Return to work/school ________  Special Instructions: No smoking driving or alcohol    COMMUNITY REFERRALS UPON DISCHARGE:    Home Health:   PT & OT  Agency:[facility]   Phone:[phone]   Date of last service:11/07/2018  Medical Equipment/Items Ordered: ROLLATOR ROLLING WALKER, WIDE BEDSIDE COMMODE AND TUB BENCH  Agency/Supplier:[facility]   [phone]    My questions have been answered and I understand these instructions. I will adhere to these goals and the provided educational materials after my discharge from the hospital.  Patient/Caregiver Signature _______________________________ Date __________  Clinician Signature _______________________________________ Date __________  Please bring this form and your medication list with you to all your follow-up doctor's appointments.

## 2018-11-06 NOTE — Progress Notes (Signed)
PHYSICAL MEDICINE & REHABILITATION PROGRESS NOTE   Subjective/Complaints:  Per OT patient is now modified independent within the room using a Rollator.  Patient still complaining of fatigue with longer distance ambulation.  When up to 500 feet yesterday   ROS: Denies CP, SOB, N/V/D  Objective:   No results found. Recent Labs    11/03/18 1108  WBC 10.0  HGB 12.8*  HCT 40.1  PLT 450*   Recent Labs    11/03/18 1108  NA 136  K 3.8  CL 100  CO2 24  GLUCOSE 105*  BUN 19  CREATININE 1.44*  CALCIUM 8.9    Intake/Output Summary (Last 24 hours) at 11/06/2018 1044 Last data filed at 11/06/2018 0425 Gross per 24 hour  Intake -  Output 950 ml  Net -950 ml     Physical Exam: Vital Signs Blood pressure 102/69, pulse 70, temperature 97.7 F (36.5 C), temperature source Oral, resp. rate 17, weight 118.8 kg, SpO2 96 %. Constitutional: No distress . Vital signs reviewed. HENT: Normocephalic.  Atraumatic. Eyes: EOMI.  No discharge. Cardiovascular: No JVD. Respiratory: Normal effort. GI: Non-distended. Musc: No edema or tenderness in extremities. Neurologic: Alert and oriented Motor: Grossly 4+/5 throughout, unchanged Skin: Warm and dry.  Intact. Psych: Positive and motivated.  Assessment/Plan: 1. Functional deficits secondary to debility which require 3+ hours per day of interdisciplinary therapy in a comprehensive inpatient rehab setting.  Physiatrist is providing close team supervision and 24 hour management of active medical problems listed below.  Physiatrist and rehab team continue to assess barriers to discharge/monitor patient progress toward functional and medical goals  Care Tool:  Bathing    Body parts bathed by patient: Right arm, Left arm, Front perineal area, Abdomen, Chest, Buttocks, Left upper leg, Right upper leg, Face, Right lower leg, Left lower leg   Body parts bathed by helper: Right lower leg, Left lower leg Body parts n/a: Right lower  leg, Left lower leg   Bathing assist Assist Level: Supervision/Verbal cueing     Upper Body Dressing/Undressing Upper body dressing   What is the patient wearing?: Pull over shirt    Upper body assist Assist Level: Set up assist    Lower Body Dressing/Undressing Lower body dressing      What is the patient wearing?: Pants     Lower body assist Assist for lower body dressing: Supervision/Verbal cueing     Toileting Toileting    Toileting assist Assist for toileting: Supervision/Verbal cueing     Transfers Chair/bed transfer  Transfers assist     Chair/bed transfer assist level: Supervision/Verbal cueing     Locomotion Ambulation   Ambulation assist      Assist level: Supervision/Verbal cueing Assistive device: Rollator Max distance: 158ft   Walk 10 feet activity   Assist     Assist level: Supervision/Verbal cueing Assistive device: Rollator   Walk 50 feet activity   Assist    Assist level: Supervision/Verbal cueing Assistive device: Rollator    Walk 150 feet activity   Assist Walk 150 feet activity did not occur: Safety/medical concerns(fatigue/decreased cardiovascular endurance)  Assist level: Supervision/Verbal cueing Assistive device: Rollator    Walk 10 feet on uneven surface  activity   Assist Walk 10 feet on uneven surfaces activity did not occur: Safety/medical concerns         Wheelchair     Assist Will patient use wheelchair at discharge?: No      Wheelchair assist level: Supervision/Verbal cueing Max wheelchair distance: 83ft  Wheelchair 50 feet with 2 turns activity    Assist            Wheelchair 150 feet activity     Assist           Medical Problem List and Plan: 1.Debilitysecondary to CAD/non-STEMI status post cardiac catheterizationwith stentingcomplicated by hypoxic respiratory failure/CAP/COPD  Continue CIR PT, OT , plan discharge in a.m. 2. Antithrombotics:   -DVT/anticoagulation:Lovenox -antiplatelet therapy: aspirin 81 mg daily,Brilinta 90 mg twice a day 3. Pain Management:Tylenol as needed 4. Mood:Klonopin 1 mg daily at bedtime, Prozac 60 mg daily,Lamictal 75 mg daily -antipsychotic agents: N/A 5. Neuropsych: This patientiscapable of making decisions on hisown behalf. 6. Skin/Wound Care:Routine skin checks 7. Fluids/Electrolytes/Nutrition:Routine in and out's , I on 5/3 8. Atrial fibrillation.Amiodarone 200 mg twice a day1 month.Cardiac rate controlled 9. Hypertension. Coreg 3.125 mg twice a day, Aldactone 12.5 mg daily, Cozaar 12.5 mg daily Vitals:   11/06/18 0422 11/06/18 0729  BP: 102/69   Pulse: 70   Resp: 17   Temp: 97.7 F (36.5 C)   SpO2: 96% 96%   Hold cozaar for systolic< 110, controlled 11/06/2018 10. Acute systolic congestive heart failure. Lasix80 mg daily. -Monitor for any signs of fluid overload.  -daily weights  -Follow-upas outpt withcardiology services Filed Weights   11/04/18 0608 11/05/18 0500 11/06/18 0422  Weight: 126.5 kg 124.1 kg 118.8 kg  Large drop question technique 11.AKI.    Creatinine 1.55 on 4/28, 1.45 on 5/4    12. Diabetes mellitus. Blood sugar checks currently discontinued. Patient was on Glucophage 250 mg daily prior to admission. Fasting serum glucose 105 will not resume CBG (last 3)  No results for input(s): GLUCAP in the last 72 hours. 14. COPD/CAP with tobacco abuse. Counseling.Completed course of Zithromax and Rocephin May discontinue IV 15. Hyperlipidemia. Lipitor 16. BPH. Flomax   LOS: 10 days A FACE TO FACE EVALUATION WAS PERFORMED  Erick Colace 11/06/2018, 10:44 AM

## 2018-11-06 NOTE — Progress Notes (Signed)
Occupational Therapy Session Note  Patient Details  Name: Justin Bradford. MRN: [id number] Date of Birth: [birth year]  Today's Date: 11/06/2018 OT Individual Time: 0800-0902 OT Individual Time Calculation (min): 62 min    Short Term Goals: Week 1:  OT Short Term Goal 1 (Week 1): STG=LTG 2/2 ELOS  Skilled Therapeutic Interventions/Progress Updates:    Pt completed supine to sit EOB with independence.  He was then able to complete toileting with modified independence and then washed his hands and brushed his teeth.  He then ambulated down to the tub/shower room with modified independence using the rollator and completed tub transfer.  Oxygen sats 94% with HR at 75 after mobility.  Dyspnea at 3/4 with pt reporting increased fatigue.  He was able to then walk back to the room and transfer back to the EOB without resting.  Pt left with MD and nursing in room.  Pt is now modified independent in the room with use of his rollator.    Therapy Documentation Precautions:  Precautions Precautions: Fall Restrictions Weight Bearing Restrictions: No RUE Weight Bearing: Non weight bearing   Pain: Pain Assessment Pain Scale: 0-10 Pain Score: 0-No pain ADL: See Care Tool Section for some details of ADLs  Therapy/Group: Individual Therapy  Aleicia Kenagy OTR/L 11/06/2018, 11:53 AM

## 2018-11-06 NOTE — Progress Notes (Signed)
Physical Therapy Discharge Summary  Patient Details  Name: Justin Bradford. MRN: [id number] Date of Birth: 1940/12/06  Today's Date: 11/06/2018 PT Individual Time: 3154-0086 PT Individual Time Calculation (min): 76 min    Patient has met 7 of 7 long term goals due to improved activity tolerance, improved balance, improved postural control and increased strength.  Patient to discharge at an ambulatory level Modified Independent.    All goals met.  Recommendation:  Patient will benefit from ongoing skilled PT services in home health setting to continue to advance safe functional mobility, address ongoing impairments in activity tolerance, B LE strength, standing balance, higher level gait, and minimize fall risk.  Equipment: Rollator  Reasons for discharge: treatment goals met  Patient/family agrees with progress made and goals achieved: Yes  Skilled Therapeutic Interventions/Progress Updates:  Patient received supine in bed and agreeable to therapy session. Performed supine<>sit independently without bed features. Performed sit<>stand from EOB/rollator seat/car/mat to rollator throughout session mod-I using rollator. Pt ambulated ~249f, ~719f ~12527f~115f35fll with seated breaks between each bout) using rollator mod-I throughout session. Pt performed ambulatory car transfer using rollator mod-I with min cuing for AD safety during transfer. Pt ambulated ~15ft41fdown ramp using rollator mod-I to replicate home entrance with education on use of breaks walking downhill if needed. Pt performed sit<>stand from recliner to rollator mod-I. Pt participated in [place] with improvement in score from 35 to 43/56; however, continues to demonstrate increased fall risk. (<36= high risk for falls, close to 100%; 37-45 significant >80%; 46-51 moderate >50%; 52-55 lower >25%). Pt educated on results of test, improvement within 3 days, and need to use AD at home. Pt participated in TUG without  using AD, with close supervision for safety, resulting in time of 19.30seconds, which is an improvement from 29.0249conds 3 days prior. Therapist reviewed 2 HEP exercises of standing hip abduction and standing mini-squats 1set of 10 each with pt demonstrating proper set-up and performance of exercise with minimal cuing. Therapist reinforced prior education regarding decreasing fall risk in the home. Pt left sitting in recliner with needs in reach.   PT Discharge Precautions/Restrictions Precautions Precautions: Fall Restrictions Weight Bearing Restrictions: No Pain  Denies pain during session. Vision/Perception  Perception Perception: Within Functional Limits Praxis Praxis: Intact  Cognition Overall Cognitive Status: Within Functional Limits for tasks assessed Arousal/Alertness: Awake/alert Orientation Level: Oriented X4 Attention: Focused;Sustained Focused Attention: Appears intact Sustained Attention: Appears intact Memory: Impaired(often repeats the same information from session to session or throughout a longer session demonstrating he doesn't remember having told therapist that information already) Awareness: Appears intact Safety/Judgment: Appears intact Sensation Sensation Light Touch: Impaired by gross assessment(impaired distally in B LEs with pt having hx of peripheral neuropathy) Hot/Cold: Not tested Proprioception: Not tested Stereognosis: Not tested Coordination Gross Motor Movements are Fluid and Coordinated: Yes Motor  Motor Motor: Other (comment) Motor - Discharge Observations: Still with generalized weakness  Mobility Bed Mobility Bed Mobility: Rolling Right;Rolling Left;Supine to Sit;Sit to Supine Rolling Right: Independent Rolling Left: Independent Supine to Sit: Independent Sit to Supine: Independent Transfers Transfers: Sit to Stand;Stand to Sit;Stand Pivot Transfers Sit to Stand: Independent with assistive device Stand to Sit: Independent with  assistive device Stand Pivot Transfers: Independent with assistive device Transfer (Assistive device): Rollator Locomotion  Gait Ambulation: Yes Gait Assistance: Independent with assistive device Gait Distance (Feet): 200 Feet Assistive device: Rollator Gait Gait: Yes Gait Pattern: Within Functional Limits Gait velocity: decreased Stairs / Additional Locomotion Stairs: No Ramp:  Independent with assistive device(per home entrance ) Wheelchair Mobility Wheelchair Mobility: No  Trunk/Postural Assessment  Cervical Assessment Cervical Assessment: Within Functional Limits Thoracic Assessment Thoracic Assessment: Within Functional Limits Lumbar Assessment Lumbar Assessment: Within Functional Limits Postural Control Postural Control: Deficits on evaluation Postural Limitations: decreased  Balance Balance Balance Assessed: Yes Standardized Balance Assessment Standardized Balance Assessment: Berg Balance Test;Timed Up and Go Test Berg Balance Test Sit to Stand: Able to stand without using hands and stabilize independently Standing Unsupported: Able to stand safely 2 minutes Sitting with Back Unsupported but Feet Supported on Floor or Stool: Able to sit safely and securely 2 minutes Stand to Sit: Sits safely with minimal use of hands Transfers: Able to transfer safely, minor use of hands Standing Unsupported with Eyes Closed: Able to stand 10 seconds safely Standing Ubsupported with Feet Together: Able to place feet together independently and stand 1 minute safely From Standing, Reach Forward with Outstretched Arm: Reaches forward but needs supervision From Standing Position, Pick up Object from Floor: Able to pick up shoe, needs supervision From Standing Position, Turn to Look Behind Over each Shoulder: Looks behind from both sides and weight shifts well Turn 360 Degrees: Able to turn 360 degrees safely but slowly Standing Unsupported, Alternately Place Feet on Step/Stool: Able to  complete 4 steps without aid or supervision Standing Unsupported, One Foot in Front: Able to take small step independently and hold 30 seconds Standing on One Leg: Tries to lift leg/unable to hold 3 seconds but remains standing independently Total Score: 43 Timed Up and Go Test TUG: Normal TUG Normal TUG (seconds): 19.3(without AD) Static Sitting Balance Static Sitting - Balance Support: Feet supported Static Sitting - Level of Assistance: 7: Independent Dynamic Sitting Balance Dynamic Sitting - Balance Support: Feet supported Dynamic Sitting - Level of Assistance: 7: Independent Static Standing Balance Static Standing - Balance Support: During functional activity Static Standing - Level of Assistance: 6: Modified independent (Device/Increase time) Dynamic Standing Balance Dynamic Standing - Balance Support: During functional activity;Bilateral upper extremity supported Dynamic Standing - Level of Assistance: 6: Modified independent (Device/Increase time) Extremity Assessment      RLE Assessment RLE Assessment: Within Functional Limits LLE Assessment LLE Assessment: Within Functional Limits    Tawana Scale, PT, DPT 11/06/2018, 11:21 AM

## 2018-11-06 NOTE — Progress Notes (Signed)
Social Work  Discharge Note  The overall goal for the admission was met for:   Discharge location: Yes-HOME WITH INTERMITTENT ASSIST FROM FRIENDS  Length of Stay: Yes-11 DAYS  Discharge activity level: Yes-INDEPENDENT WITH DEVICE  Home/community participation: Yes  Services provided included: MD, RD, PT, OT, RN, CM, Pharmacy, Neuropsych and SW  Financial Services: Medicare  Follow-up services arranged: Home Health: [facility]-PT & OT, DME: [facility]-WIDE BEDSIDE COMMODE, TUB BENCH AND ROLLATOR and Patient/Family has no preference for HH/DME agencies  Comments (or additional information):PT DID [place] FEELS CONFIDENT AND READY TO [place]. PLANS TO GO BACK TO HIS COUNSELOR HE WAS SEEING WEEKLY FOR GRIEVE SUPPORT  Patient/Family verbalized understanding of follow-up arrangements: Yes  Individual responsible for coordination of the follow-up plan: SELF  Confirmed correct DME delivered: Elease Hashimoto 11/06/2018    Elease Hashimoto

## 2018-11-07 NOTE — Progress Notes (Signed)
Pt ambulated in the room by self with front wheel walker. Pt appears to be anxious due to being d/c home on 5/8. Pt resting in bed at this time.

## 2018-11-07 NOTE — Progress Notes (Signed)
[place] PHYSICAL MEDICINE & REHABILITATION PROGRESS NOTE   Subjective/Complaints:  No issues overnight looking forward to going home.   ROS: Denies CP, SOB, N/V/D  Objective:   No results found. No results for input(s): WBC, HGB, HCT, PLT in the last 72 hours. No results for input(s): NA, K, CL, CO2, GLUCOSE, BUN, CREATININE, CALCIUM in the last 72 hours.  Intake/Output Summary (Last 24 hours) at 11/07/2018 0943 Last data filed at 11/07/2018 0828 Gross per 24 hour  Intake 1076 ml  Output -  Net 1076 ml     Physical Exam: Vital Signs Blood pressure 118/64, pulse 68, temperature 98.3 F (36.8 C), resp. rate 18, weight 120.2 kg, SpO2 94 %. Constitutional: No distress . Vital signs reviewed. HENT: Normocephalic.  Atraumatic. Eyes: EOMI.  No discharge. Cardiovascular: No JVD. Respiratory: Normal effort. GI: Non-distended. Musc: No edema or tenderness in extremities. Neurologic: Alert and oriented Motor: Grossly 4+/5 throughout, unchanged Skin: Warm and dry.  Intact. Psych: Positive and motivated.  Assessment/Plan: 1. Functional deficits secondary to debility  Stable for D/C today F/u PCP in 3-4 weeks F/u PM&R 2 weeks See D/C summary See D/C instructions Care Tool:  Bathing    Body parts bathed by patient: Left arm, Front perineal area, Abdomen, Chest, Buttocks, Left upper leg, Right upper leg, Face, Right lower leg, Left lower leg, Right arm   Body parts bathed by helper: Right lower leg, Left lower leg Body parts n/a: Right lower leg, Left lower leg   Bathing assist Assist Level: Independent with assistive device Assistive Device Comment: tub bench   Upper Body Dressing/Undressing Upper body dressing   What is the patient wearing?: Pull over shirt    Upper body assist Assist Level: Independent    Lower Body Dressing/Undressing Lower body dressing      What is the patient wearing?: Pants     Lower body assist Assist for lower body dressing:  Independent with assitive device     Toileting Toileting    Toileting assist Assist for toileting: Independent with assistive device     Transfers Chair/bed transfer  Transfers assist     Chair/bed transfer assist level: Independent with assistive device     Locomotion Ambulation   Ambulation assist      Assist level: Independent with assistive device Assistive device: Rollator Max distance: 267ft   Walk 10 feet activity   Assist     Assist level: Independent with assistive device Assistive device: Rollator   Walk 50 feet activity   Assist    Assist level: Independent with assistive device Assistive device: Rollator    Walk 150 feet activity   Assist Walk 150 feet activity did not occur: Safety/medical concerns(fatigue/decreased cardiovascular endurance)  Assist level: Independent with assistive device Assistive device: Rollator    Walk 10 feet on uneven surface  activity   Assist Walk 10 feet on uneven surfaces activity did not occur: Safety/medical concerns   Assist level: Independent with assistive device(ramp) Assistive device: Rollator   Wheelchair     Assist Will patient use wheelchair at discharge?: No      Wheelchair assist level: Supervision/Verbal cueing Max wheelchair distance: 60ft     Wheelchair 50 feet with 2 turns activity    Assist            Wheelchair 150 feet activity     Assist           Medical Problem List and Plan: 1.Debilitysecondary to CAD/non-STEMI status post cardiac catheterizationwith stentingcomplicated  by hypoxic respiratory failure/CAP/COPD  Continue CIR PT, OT , plan discharge today2. Antithrombotics:  -DVT/anticoagulation:Lovenox -antiplatelet therapy: aspirin 81 mg daily,Brilinta 90 mg twice a day 3. Pain Management:Tylenol as needed 4. Mood:Klonopin 1 mg daily at bedtime, Prozac 60 mg daily,Lamictal 75 mg daily -antipsychotic agents:  N/A 5. Neuropsych: This patientiscapable of making decisions on hisown behalf. 6. Skin/Wound Care:Routine skin checks 7. Fluids/Electrolytes/Nutrition:stable 8. Atrial fibrillation.Amiodarone 200 mg twice a day1 month.Cardiac rate controlled 9. Hypertension. Coreg 3.125 mg twice a day, Aldactone 12.5 mg daily, Cozaar 12.5 mg daily Vitals:   11/07/18 0727 11/07/18 0935  BP:  118/64  Pulse:  68  Resp:    Temp:    SpO2: 94%    Hold cozaar for systolic< 110, controlled 11/07/2018 10. Acute systolic congestive heart failure. Lasix80 mg daily. -Monitor for any signs of fluid overload.  -daily weights  -Follow-upas outpt withcardiology services Filed Weights   11/05/18 0500 11/06/18 0422 11/07/18 0500  Weight: 124.1 kg 118.8 kg 120.2 kg  Large drop question technique 11.AKI.    Creatinine 1.55 on 4/28, 1.45 on 5/4    12. Diabetes mellitus. Blood sugar checks currently discontinued. Patient was on Glucophage 250 mg daily prior to admission. Fasting serum glucose 105 will not resume CBG (last 3)  No results for input(s): GLUCAP in the last 72 hours. 14. COPD/CAP with tobacco abuse. Counseling.Completed course of Zithromax and Rocephin May discontinue IV 15. Hyperlipidemia. Lipitor 16. BPH. Flomax   LOS: 11 days A FACE TO FACE EVALUATION WAS PERFORMED  Erick Colace 11/07/2018, 9:43 AM

## 2018-11-07 NOTE — Progress Notes (Signed)
Patient discharged to home, transported by a neighbor.

## 2018-11-17 ENCOUNTER — Encounter: Payer: Self-pay | Admitting: Cardiology

## 2018-11-17 DIAGNOSIS — I255 Ischemic cardiomyopathy: Secondary | ICD-10-CM | POA: Insufficient documentation

## 2018-11-17 DIAGNOSIS — I2510 Atherosclerotic heart disease of native coronary artery without angina pectoris: Secondary | ICD-10-CM | POA: Insufficient documentation

## 2018-11-17 DIAGNOSIS — E785 Hyperlipidemia, unspecified: Secondary | ICD-10-CM | POA: Insufficient documentation

## 2018-11-18 ENCOUNTER — Encounter: Payer: Self-pay | Admitting: Cardiology

## 2018-11-18 NOTE — Progress Notes (Signed)
Virtual Visit via Video Note   This visit type was conducted due to national recommendations for restrictions regarding the COVID-19 Pandemic (e.g. social distancing) in an effort to limit this patient's exposure and mitigate transmission in our community.  Due to his co-morbid illnesses, this patient is at least at moderate risk for complications without adequate follow up.  This format is felt to be most appropriate for this patient at this time.  All issues noted in this document were discussed and addressed.  A limited physical exam was performed with this format.  Please refer to the patient's chart for his consent to telehealth for [facility].   Date:  11/19/2018   ID:  Justin Posey., DOB 02-15-1941, MRN [id number]  Patient Location: Home Provider Location: Office  PCP:  Forrest Moron, MD  Cardiologist:  Norman Herrlich, MD  Electrophysiologist:  None   Evaluation Performed:  Follow-Up Visit  Chief Complaint:  Recent STEMI PCI and stent LAD with heart failure  History of Present Illness:    Justin Dohn. is a 78 y.o. male with a hx of HTN, HLD, BPH and obesity who was admitted with NSTEMI and found to have totally occluded LAD s/p DES on 4/16. Post MI care complicated by acute systolic HF with EF 30%. 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 States both SOB and DOE are improving while working with HHPT and HHOT. Since discharge he denies chest pain, palpitations, lower extremity edema. Reports he "feels great". Endorses compliance with all medications. On DAPT of Brilinta and aspirin - no symptoms of bleeding. He just got a new scale and BP cuff so did not have vitals today, but he states vitals by HHOT were stable.   The patient does not have symptoms  concerning for COVID-19 infection (fever, chills, cough, or new shortness of breath).    Past Medical History:  Diagnosis Date  . Acute systolic heart failure (HCC)   . Hypertension   . Peripheral neuropathy    Past Surgical History:  Procedure Laterality Date  . CORONARY STENT INTERVENTION N/A 10/16/2018   Procedure: CORONARY STENT INTERVENTION;  Surgeon: Lennette Bihari, MD;  [facility]VAS[facility] CV LAB;  Service: Cardiovascular;  Laterality: N/A;  . KNEE SURGERY    . LEFT HEART CATH AND CORONARY ANGIOGRAPHY N/A 10/16/2018   Procedure: LEFT HEART CATH AND CORONARY ANGIOGRAPHY;  Surgeon: Lennette Bihari, MD;  Location: [facility] INVASIVE CV LAB;  Service: Cardiovascular;  Laterality: N/A;     Current Meds  Medication Sig  . acetaminophen (TYLENOL) 325 MG tablet Take 2 tablets (650 mg total) by mouth every 4 (four) hours as needed for headache or mild pain.     Allergies:   Patient has no known allergies.   Social History   Tobacco Use  . Smoking status: Former Games developer  . Smokeless tobacco: Never Used  Substance Use Topics  . Alcohol use: Yes    Comment: 2beers/day  . Drug use: No     Family Hx: The patient's family history is not on file.  ROS:   Please see the history of present illness.     All other systems reviewed and are negative.   Prior CV studies:   The following studies were reviewed today:  Echo 10/16/2018: EF 30-35% with moderate aortic sclerosis  Labs/Other Tests and Data Reviewed:    EKG:  An ECG dated 11/18/18 was personally reviewed today and demonstrated:  SRTH RBBB LAHB   Recent Labs: 10/21/2018: Magnesium 2.0 10/28/2018: ALT 26 11/03/2018: BUN 19; Creatinine, Ser 1.44; Hemoglobin 12.8; Platelets 450; Potassium 3.8; Sodium 136    Ref Range & Units 3wk ago (10/23/18) 56mo ago (10/16/18) 76mo ago (10/16/18) 85mo ago (10/16/18) 45mo ago (10/15/18)  Troponin I <0.03 ng/mL 2.92High Panic   >65.00High Panic  CM 15.50High Panic  CM 12.54High Panic  CM 1.40High  Panic  CM     Recent Lipid Panel Lab Results  Component Value Date/Time   CHOL 191 10/16/2018 02:50 AM   TRIG 49 10/16/2018 02:50 AM   HDL 62 10/16/2018 02:50 AM   CHOLHDL 3.1 10/16/2018 02:50 AM   LDLCALC 119 (H) 10/16/2018 02:50 AM    Wt Readings from Last 3 Encounters:  11/07/18 264 lb 15.9 oz (120.2 kg)  10/27/18 277 lb 9 oz (125.9 kg)  04/18/16 285 lb (129.3 kg)     Objective:    Vital Signs:  There were no vitals taken for this visit.   VITAL SIGNS:  reviewed GEN:  no acute distress PSYCH:  normal affect  ASSESSMENT & PLAN:    1. CAD s/p NSTEMI 10/16/18 with DES to LAD - No anginal symptoms. Continue GDMT.  a. DAPT Brilinta and aspirin x1 year. Will recheck CBC in 1-2 weeks.  2. Hypertensive heart disease with heart failure - EF 30-35% on echo 10/16/18 likely secondary to 100% occluded LAD. Will repeat echo in 1-2 weeks to reassess heart muscle function.  a. Reports no edema. Continue current diuresis with Lasix 80 daily and Potassium 40 daily. Will recheck CMP and ProBNP in 1-2 weeks. Will send [facility] ReDS Vest program to his home. b. No BP reading today, he is setting up new BP cuff today and will check BP daily.   c. Encourage 30 minutes of exercise 4-5 times per week per [facility]HA. Currently exercising with HHPT and HHOT.  3. Ascending thoracic aortic aneurysm - 4.3 cm by CT 10/2018. Will repeat CT in 6-12 mos for follow up.  4. HLD - Continue Lipitor 80mg  daily. Encourage low fat, heart healthy diet. Lipid panel 10/16/2018 with LDL 119, HDL 62, Triglycerides 49. Repeat lipid panel in 1-2 weeks.   COVID-19 Education: The signs and symptoms of COVID-19 were discussed with the patient and how to seek care for testing (follow up with PCP or arrange E-visit).  The importance of social distancing was discussed today.  Time:   Today, I have spent 20  minutes with the patient with telehealth technology discussing the above problems.     Medication Adjustments/Labs and  Tests Ordered: Current medicines are reviewed at length with the patient today.  Concerns regarding medicines are outlined above.   Tests Ordered: No orders of the defined types were placed in this encounter.   Medication Changes: No orders of the defined types were placed in this encounter.   Disposition:  Follow up in 4 week(s) in office.   Signed, Norman Herrlich, MD  11/19/2018 2:34 PM    [facility]

## 2018-11-19 ENCOUNTER — Encounter: Payer: Self-pay | Admitting: Cardiology

## 2018-11-19 ENCOUNTER — Telehealth: Payer: Self-pay | Admitting: Family

## 2018-11-19 ENCOUNTER — Telehealth (INDEPENDENT_AMBULATORY_CARE_PROVIDER_SITE_OTHER): Payer: Medicare Other | Admitting: Cardiology

## 2018-11-19 ENCOUNTER — Other Ambulatory Visit: Payer: Self-pay

## 2018-11-19 VITALS — Ht 72.0 in | Wt 270.0 lb

## 2018-11-19 DIAGNOSIS — I480 Paroxysmal atrial fibrillation: Secondary | ICD-10-CM

## 2018-11-19 DIAGNOSIS — I255 Ischemic cardiomyopathy: Secondary | ICD-10-CM

## 2018-11-19 DIAGNOSIS — Z79899 Other long term (current) drug therapy: Secondary | ICD-10-CM

## 2018-11-19 DIAGNOSIS — I2510 Atherosclerotic heart disease of native coronary artery without angina pectoris: Secondary | ICD-10-CM

## 2018-11-19 DIAGNOSIS — R5381 Other malaise: Secondary | ICD-10-CM

## 2018-11-19 DIAGNOSIS — E782 Mixed hyperlipidemia: Secondary | ICD-10-CM

## 2018-11-19 DIAGNOSIS — I110 Hypertensive heart disease with heart failure: Secondary | ICD-10-CM

## 2018-11-19 DIAGNOSIS — J449 Chronic obstructive pulmonary disease, unspecified: Secondary | ICD-10-CM

## 2018-11-19 DIAGNOSIS — I5022 Chronic systolic (congestive) heart failure: Secondary | ICD-10-CM

## 2018-11-19 NOTE — Telephone Encounter (Signed)
[facility] Home Visit Initial Request  Agency Requested:    [facility] Contact:  Dara Hoyer, RN, CCM [address], [address] [place], [place]  [address] [email or web address]  Phone #: [phone] Fax #: [phone]  Patient Demographic Information: Name:  Justin Bradford. Age:  78 y.o.   DOB:  July 17, 1940  MRN:  [id number]   Address:   [address] [place] [address]   Phone Numbers:   Home Phone [phone]  Mobile [phone]     Emergency Contact Information on File:   Contact Information    Name Relation Home Work [place], Larita Fife Sister   [phone]   Shawnie Dapper [phone]  [phone]      The above family members may be contacted for information on this patient (review DPR on file):  Yes    Patient Clinical Information:  Primary Care Provider:  Forrest Moron, MD  Primary Cardiologist:  Norman Herrlich, MD  Primary Electrophysiologist:  None   Requesting Provider:  Dr. Norman Herrlich    Past Medical Hx: Justin Bradford  has a past medical history of Acute systolic heart failure (HCC), Heart attack [facility]), Hypertension, and Peripheral neuropathy.   Allergies: He has No Known Allergies.   Medications: Current Outpatient Medications on File Prior to Visit  Medication Sig  . acetaminophen (TYLENOL) 325 MG tablet Take 2 tablets (650 mg total) by mouth every 4 (four) hours as needed for headache or mild pain.  Marland Kitchen amiodarone (PACERONE) 200 MG tablet Take 1 tablet (200 mg total) by mouth daily.  Marland Kitchen aspirin EC 81 MG tablet Take 81 mg by mouth daily.  Marland Kitchen atorvastatin (LIPITOR) 80 MG tablet Take 1 tablet (80 mg total) by mouth daily at 6 PM.  . carvedilol (COREG) 3.125 MG tablet Take 1 tablet (3.125 mg total) by mouth 2 (two) times daily with a meal for 30 days.  . clonazePAM (KLONOPIN) 1 MG tablet Take 1 tablet (1 mg total) by mouth at bedtime.  Marland Kitchen FLUoxetine (PROZAC) 20 MG capsule Take 3  capsules (60 mg total) by mouth daily for 30 days.  . fluticasone furoate-vilanterol (BREO ELLIPTA) 200-25 MCG/INH AEPB Inhale 1 puff into the lungs daily for 30 days.  . furosemide (LASIX) 80 MG tablet Take 1 tablet (80 mg total) by mouth daily for 30 days.  Marland Kitchen lamoTRIgine (LAMICTAL) 25 MG tablet Take 3 tablets (75 mg total) by mouth at bedtime.  Marland Kitchen losartan (COZAAR) 25 MG tablet Take 0.5 tablets (12.5 mg total) by mouth daily for 30 days.  . metFORMIN (GLUCOPHAGE) 500 MG tablet Take 0.5 tablets (250 mg total) by mouth every morning.  . pantoprazole (PROTONIX) 40 MG tablet Take 1 tablet (40 mg total) by mouth daily.  . potassium chloride SA (K-DUR) 20 MEQ tablet Take 2 tablets (40 mEq total) by mouth daily for 30 days.  Marland Kitchen spironolactone (ALDACTONE) 25 MG tablet Take 0.5 tablets (12.5 mg total) by mouth daily for 30 days.  . tamsulosin (FLOMAX) 0.4 MG CAPS capsule Take 1 capsule (0.4 mg total) by mouth at bedtime.  . ticagrelor (BRILINTA) 90 MG TABS tablet Take 1 tablet (90 mg total) by mouth 2 (two) times daily for 30 days.   No current facility-administered medications on file prior to visit.      Social Hx: He  reports that he has quit smoking. His smoking use included cigarettes. He has never used smokeless tobacco. He reports previous alcohol use.  He reports that he does not use drugs.    Diagnosis/Reason for Visit:   CAD s/p DES to LAD on 10/16/18.  Hypertensive heart disease with heart failure: NSTEMI with 100% obstruction LAD on 10/16/18. Echo showed EF 30-35%. Would like to assess ReDS Vest reading given newly reduced EF. Currently on Lasix 80mg  daily and GDMT. Plan to repeat echocardiogram in 1-2 weeks to assess if low EF was secondary to LAD blockage.  PAF Brief episode of AF RVR while hospitalized for which he is on Amiodarone 200mg  200 daily. Telemetry strip if possible.   Services Requested:  Rhythm Strip Geophysicist/field seismologist)  ReDS Heart Failure Measurement  # of Visits  Needed/Frequency per Week: Anticipate 1 visit needed. If ReDS Vest reading high will readdress.   Printed - A copy of the office note will be faxed with this form. N/A - All labs ordered for this home visit have been released.

## 2018-11-19 NOTE — Patient Instructions (Addendum)
Medication Instructions:  Your physician recommends that you continue on your current medications as directed. Please refer to the Current Medication list given to you today.  If you need a refill on your cardiac medications before your next appointment, please call your pharmacy.   Lab work: Your physician recommends that you return for lab work in: ProBNP, CBC, CMP, lipid profile. Please fast beforehand.   If you have labs (blood work) drawn today and your tests are completely normal, you will receive your results only by: Marland Kitchen MyChart Message (if you have MyChart) OR . A paper copy in the mail If you have any lab test that is abnormal or we need to change your treatment, we will call you to review the results.  Testing/Procedures: Your physician has requested that you have an echocardiogram. Echocardiography is a painless test that uses sound waves to create images of your heart. It provides your doctor with information about the size and shape of your heart and how well your heart's chambers and valves are working. This procedure takes approximately one hour. There are no restrictions for this procedure. This will be done at the [facility] on Wednesday, 12/10/2018, at 10:15 am.   You have been referred for [facility] Heart Failure Program with the [facility]. You will be contacted to arrange this.   Follow-Up: At [facility], you and your health needs are our priority.  As part of our continuing mission to provide you with exceptional heart care, we have created designated Provider Care Teams.  These Care Teams include your primary Cardiologist (physician) and Advanced Practice Providers (APPs -  Physician Assistants and Nurse Practitioners) who all work together to provide you with the care you need, when you need it. You will need a follow up appointment in 4 weeks: Wednesday, 12/17/2018, at 11:00 am in the [facility] office.      Echocardiogram An echocardiogram is a  procedure that uses painless sound waves (ultrasound) to produce an image of the heart. Images from an echocardiogram can provide important information about:  Signs of coronary artery disease (CAD).  Aneurysm detection. An aneurysm is a weak or damaged part of an artery wall that bulges out from the normal force of blood pumping through the body.  Heart size and shape. Changes in the size or shape of the heart can be associated with certain conditions, including heart failure, aneurysm, and CAD.  Heart muscle function.  Heart valve function.  Signs of a past heart attack.  Fluid buildup around the heart.  Thickening of the heart muscle.  A tumor or infectious growth around the heart valves. Tell a health care provider about:  Any allergies you have.  All medicines you are taking, including vitamins, herbs, eye drops, creams, and over-the-counter medicines.  Any blood disorders you have.  Any surgeries you have had.  Any medical conditions you have.  Whether you are pregnant or may be pregnant. What are the risks? Generally, this is a safe procedure. However, problems may occur, including:  Allergic reaction to dye (contrast) that may be used during the procedure. What happens before the procedure? No specific preparation is needed. You may eat and drink normally. What happens during the procedure?   An IV tube may be inserted into one of your veins.  You may receive contrast through this tube. A contrast is an injection that improves the quality of the pictures from your heart.  A gel will be applied to your  chest.  A wand-like tool (transducer) will be moved over your chest. The gel will help to transmit the sound waves from the transducer.  The sound waves will harmlessly bounce off of your heart to allow the heart images to be captured in real-time motion. The images will be recorded on a computer. The procedure may vary among health care providers and hospitals.  What happens after the procedure?  You may return to your normal, everyday life, including diet, activities, and medicines, unless your health care provider tells you not to do that. Summary  An echocardiogram is a procedure that uses painless sound waves (ultrasound) to produce an image of the heart.  Images from an echocardiogram can provide important information about the size and shape of your heart, heart muscle function, heart valve function, and fluid buildup around your heart.  You do not need to do anything to prepare before this procedure. You may eat and drink normally.  After the echocardiogram is completed, you may return to your normal, everyday life, unless your health care provider tells you not to do that. This information is not intended to replace advice given to you by your health care provider. Make sure you discuss any questions you have with your health care provider. Document Released: 06/15/2000 Document Revised: 07/21/2016 Document Reviewed: 07/21/2016 Elsevier Interactive Patient Education  2019 [employer].

## 2018-11-19 NOTE — Addendum Note (Signed)
Addended by: Crist Fat on: 11/19/2018 04:06 PM   Modules accepted: Orders

## 2018-12-10 ENCOUNTER — Other Ambulatory Visit: Payer: Self-pay

## 2018-12-10 ENCOUNTER — Ambulatory Visit (HOSPITAL_BASED_OUTPATIENT_CLINIC_OR_DEPARTMENT_OTHER)
Admission: RE | Admit: 2018-12-10 | Discharge: 2018-12-10 | Disposition: A | Discharge status: Home / Self Care | Payer: Medicare Other | Source: Ambulatory Visit | Attending: Cardiology | Admitting: Cardiology

## 2018-12-10 DIAGNOSIS — I255 Ischemic cardiomyopathy: Secondary | ICD-10-CM | POA: Diagnosis present

## 2018-12-10 DIAGNOSIS — R5381 Other malaise: Secondary | ICD-10-CM | POA: Diagnosis present

## 2018-12-10 DIAGNOSIS — I110 Hypertensive heart disease with heart failure: Secondary | ICD-10-CM | POA: Insufficient documentation

## 2018-12-10 DIAGNOSIS — J449 Chronic obstructive pulmonary disease, unspecified: Secondary | ICD-10-CM | POA: Diagnosis present

## 2018-12-10 DIAGNOSIS — I480 Paroxysmal atrial fibrillation: Secondary | ICD-10-CM | POA: Insufficient documentation

## 2018-12-10 DIAGNOSIS — I2510 Atherosclerotic heart disease of native coronary artery without angina pectoris: Secondary | ICD-10-CM | POA: Insufficient documentation

## 2018-12-10 DIAGNOSIS — E782 Mixed hyperlipidemia: Secondary | ICD-10-CM | POA: Diagnosis present

## 2018-12-10 DIAGNOSIS — Z79899 Other long term (current) drug therapy: Secondary | ICD-10-CM

## 2018-12-10 MED ORDER — PERFLUTREN LIPID MICROSPHERE
1.0000 mL | INTRAVENOUS | Status: AC | PRN
  Administered 2018-12-10: 2 mL via INTRAVENOUS
  Filled 2018-12-10: qty 10

## 2018-12-10 NOTE — Progress Notes (Signed)
  Echocardiogram 2D Echocardiogram has been performed.  Justin Bradford 12/10/2018, 11:48 AM

## 2018-12-16 ENCOUNTER — Inpatient Hospital Stay (HOSPITAL_COMMUNITY)
Admission: EM | Admit: 2018-12-16 | Discharge: 2018-12-25 | DRG: 291 | Disposition: A | Discharge status: Home Health | Payer: Medicare Other | Attending: Internal Medicine | Admitting: Internal Medicine

## 2018-12-16 ENCOUNTER — Other Ambulatory Visit: Payer: Self-pay

## 2018-12-16 ENCOUNTER — Encounter (HOSPITAL_COMMUNITY): Payer: Self-pay | Admitting: Emergency Medicine

## 2018-12-16 ENCOUNTER — Emergency Department (HOSPITAL_COMMUNITY): Payer: Medicare Other

## 2018-12-16 DIAGNOSIS — Z09 Encounter for follow-up examination after completed treatment for conditions other than malignant neoplasm: Secondary | ICD-10-CM

## 2018-12-16 DIAGNOSIS — E876 Hypokalemia: Secondary | ICD-10-CM | POA: Diagnosis present

## 2018-12-16 DIAGNOSIS — I110 Hypertensive heart disease with heart failure: Secondary | ICD-10-CM | POA: Diagnosis not present

## 2018-12-16 DIAGNOSIS — I712 Thoracic aortic aneurysm, without rupture: Secondary | ICD-10-CM | POA: Diagnosis present

## 2018-12-16 DIAGNOSIS — I5043 Acute on chronic combined systolic (congestive) and diastolic (congestive) heart failure: Secondary | ICD-10-CM | POA: Diagnosis present

## 2018-12-16 DIAGNOSIS — D649 Anemia, unspecified: Secondary | ICD-10-CM | POA: Diagnosis present

## 2018-12-16 DIAGNOSIS — I480 Paroxysmal atrial fibrillation: Secondary | ICD-10-CM | POA: Diagnosis present

## 2018-12-16 DIAGNOSIS — E669 Obesity, unspecified: Secondary | ICD-10-CM | POA: Diagnosis present

## 2018-12-16 DIAGNOSIS — J449 Chronic obstructive pulmonary disease, unspecified: Secondary | ICD-10-CM | POA: Diagnosis present

## 2018-12-16 DIAGNOSIS — I2510 Atherosclerotic heart disease of native coronary artery without angina pectoris: Secondary | ICD-10-CM | POA: Diagnosis present

## 2018-12-16 DIAGNOSIS — Z8249 Family history of ischemic heart disease and other diseases of the circulatory system: Secondary | ICD-10-CM

## 2018-12-16 DIAGNOSIS — E1169 Type 2 diabetes mellitus with other specified complication: Secondary | ICD-10-CM | POA: Diagnosis present

## 2018-12-16 DIAGNOSIS — R918 Other nonspecific abnormal finding of lung field: Secondary | ICD-10-CM

## 2018-12-16 DIAGNOSIS — R0902 Hypoxemia: Secondary | ICD-10-CM

## 2018-12-16 DIAGNOSIS — E119 Type 2 diabetes mellitus without complications: Secondary | ICD-10-CM | POA: Diagnosis present

## 2018-12-16 DIAGNOSIS — F419 Anxiety disorder, unspecified: Secondary | ICD-10-CM | POA: Diagnosis present

## 2018-12-16 DIAGNOSIS — Z6837 Body mass index (BMI) 37.0-37.9, adult: Secondary | ICD-10-CM

## 2018-12-16 DIAGNOSIS — F329 Major depressive disorder, single episode, unspecified: Secondary | ICD-10-CM | POA: Diagnosis present

## 2018-12-16 DIAGNOSIS — Z20828 Contact with and (suspected) exposure to other viral communicable diseases: Secondary | ICD-10-CM | POA: Diagnosis present

## 2018-12-16 DIAGNOSIS — Z87891 Personal history of nicotine dependence: Secondary | ICD-10-CM

## 2018-12-16 DIAGNOSIS — J189 Pneumonia, unspecified organism: Secondary | ICD-10-CM | POA: Diagnosis present

## 2018-12-16 DIAGNOSIS — E785 Hyperlipidemia, unspecified: Secondary | ICD-10-CM | POA: Diagnosis present

## 2018-12-16 DIAGNOSIS — J441 Chronic obstructive pulmonary disease with (acute) exacerbation: Secondary | ICD-10-CM | POA: Diagnosis present

## 2018-12-16 DIAGNOSIS — Z955 Presence of coronary angioplasty implant and graft: Secondary | ICD-10-CM

## 2018-12-16 DIAGNOSIS — R0603 Acute respiratory distress: Secondary | ICD-10-CM

## 2018-12-16 DIAGNOSIS — I252 Old myocardial infarction: Secondary | ICD-10-CM

## 2018-12-16 DIAGNOSIS — Z7982 Long term (current) use of aspirin: Secondary | ICD-10-CM

## 2018-12-16 DIAGNOSIS — J440 Chronic obstructive pulmonary disease with acute lower respiratory infection: Secondary | ICD-10-CM | POA: Diagnosis present

## 2018-12-16 DIAGNOSIS — I5022 Chronic systolic (congestive) heart failure: Secondary | ICD-10-CM | POA: Diagnosis present

## 2018-12-16 DIAGNOSIS — J9601 Acute respiratory failure with hypoxia: Secondary | ICD-10-CM | POA: Diagnosis present

## 2018-12-16 DIAGNOSIS — Z8349 Family history of other endocrine, nutritional and metabolic diseases: Secondary | ICD-10-CM

## 2018-12-16 DIAGNOSIS — Z7984 Long term (current) use of oral hypoglycemic drugs: Secondary | ICD-10-CM

## 2018-12-16 HISTORY — DX: Unspecified osteoarthritis, unspecified site: M19.90

## 2018-12-16 HISTORY — DX: Pneumonia, unspecified organism: J18.9

## 2018-12-16 HISTORY — DX: Chronic obstructive pulmonary disease, unspecified: J44.9

## 2018-12-16 LAB — CBC WITH DIFFERENTIAL/PLATELET
Abs Immature Granulocytes: 0.08 10*3/uL — ABNORMAL HIGH (ref 0.00–0.07)
Basophils Absolute: 0.1 10*3/uL (ref 0.0–0.1)
Basophils Relative: 1 %
Eosinophils Absolute: 0.2 10*3/uL (ref 0.0–0.5)
Eosinophils Relative: 1 %
HCT: 38.9 % — ABNORMAL LOW (ref 39.0–52.0)
Hemoglobin: 12.5 g/dL — ABNORMAL LOW (ref 13.0–17.0)
Immature Granulocytes: 0 %
Lymphocytes Relative: 11 %
Lymphs Abs: 2 10*3/uL (ref 0.7–4.0)
MCH: 29.9 pg (ref 26.0–34.0)
MCHC: 32.1 g/dL (ref 30.0–36.0)
MCV: 93.1 fL (ref 80.0–100.0)
Monocytes Absolute: 1.8 10*3/uL — ABNORMAL HIGH (ref 0.1–1.0)
Monocytes Relative: 10 %
Neutro Abs: 13.8 10*3/uL — ABNORMAL HIGH (ref 1.7–7.7)
Neutrophils Relative %: 77 %
Platelets: 382 10*3/uL (ref 150–400)
RBC: 4.18 MIL/uL — ABNORMAL LOW (ref 4.22–5.81)
RDW: 14.6 % (ref 11.5–15.5)
WBC: 18 10*3/uL — ABNORMAL HIGH (ref 4.0–10.5)
nRBC: 0 % (ref 0.0–0.2)

## 2018-12-16 MED ORDER — ALBUTEROL SULFATE HFA 108 (90 BASE) MCG/ACT IN AERS
8.0000 | INHALATION_SPRAY | Freq: Once | RESPIRATORY_TRACT | Status: AC
  Administered 2018-12-16: 8 via RESPIRATORY_TRACT
  Filled 2018-12-16: qty 6.7

## 2018-12-16 NOTE — Progress Notes (Deleted)
Cardiology Office Note:    Date:  12/16/2018   ID:  Justin Bradford., DOB 1940-10-26, MRN [id number]  PCP:  Forrest Moron, MD  Cardiologist:  Norman Herrlich, MD    Referring MD: Forrest Moron, MD    ASSESSMENT:    No diagnosis found. PLAN:    In order of problems listed above:  1. ***   Next appointment: ***   Medication Adjustments/Labs and Tests Ordered: Current medicines are reviewed at length with the patient today.  Concerns regarding medicines are outlined above.  No orders of the defined types were placed in this encounter.  No orders of the defined types were placed in this encounter.   No chief complaint on file.   History of Present Illness:    Justin Bradford. is a 78 y.o. male with a hx of a hx of HTN, HLD, BPH and obesity who was admitted with NSTEMI and found to have totally occluded LAD s/p DES on 4/16. Post MI care complicated by acute systolic HF with EF 30%. 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   He was last seen 11/19/2018. A subsequent Echo showed mild improvement.  Echo 12/10/2018:     1. The left ventricle has moderately reduced systolic function, with an ejection fraction of 35%. The cavity size was normal. There is moderate concentric left ventricular hypertrophy. Left ventricular diastolic Doppler parameters are indeterminate. LV Wall Scoring: The apical septal segment, apical inferior segment, and apex are akinetic. The basal and mid anterior septum, apical lateral segment, and apical anterior segment are hypokinetic. All remaining scored segments are normal.  2. The right ventricle has normal systolic function. The cavity was normal. There is no increase in right ventricular wall thickness.  3. The mitral valve is degenerative. There is mild to moderate mitral annular calcification present. No evidence of mitral valve stenosis.  4.  Mild thickening of the aortic valve. Mild focal calcification of the aortic valve. Aortic valve regurgitation is mild by color flow Doppler.  5. 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  Compliance with diet, lifestyle and medications: *** Past Medical History:  Diagnosis Date  . Acute systolic heart failure (HCC)   . Heart attack (HCC)   . Hypertension   . Peripheral neuropathy     Past Surgical History:  Procedure Laterality Date  . CORONARY STENT INTERVENTION N/A 10/16/2018   Procedure: CORONARY STENT INTERVENTION;  Surgeon: Lennette Bihari, MD;  Location: [facility] INVASIVE CV LAB;  Service: Cardiovascular;  Laterality: N/A;  . KNEE SURGERY  1994 & 1998  . LEFT HEART CATH AND CORONARY ANGIOGRAPHY N/A 10/16/2018   Procedure: LEFT HEART CATH AND CORONARY ANGIOGRAPHY;  Surgeon: Lennette Bihari, MD;  Location: [facility] INVASIVE CV LAB;  Service: Cardiovascular;  Laterality: N/A;  . TONSILLECTOMY      Current Medications: No outpatient medications have been marked as taking for the 12/17/18 encounter (Appointment) with Baldo Daub, MD.     Allergies:   Patient has no known allergies.   Social History   Socioeconomic History  . Marital status: Widowed    Spouse name: Not on file  . Number of children: Not on file  . Years of education: Not on file  . Highest education level: Not on file  Occupational History  . Not on file  Social Needs  . Financial resource strain: Not  on file  . Food insecurity    Worry: Not on file    Inability: Not on file  . Transportation needs    Medical: Not on file    Non-medical: Not on file  Tobacco Use  . Smoking status: Former Smoker    Types: Cigarettes  . Smokeless tobacco: Never Used  . Tobacco comment: quit in 2007  Substance and Sexual Activity  . Alcohol use: Not Currently  . Drug use: No  . Sexual activity: Not on file  Lifestyle  . Physical activity    Days per week: Not on file    Minutes per session: Not  on file  . Stress: Not on file  Relationships  . Social Herbalist on phone: Not on file    Gets together: Not on file    Attends religious service: Not on file    Active member of club or organization: Not on file    Attends meetings of clubs or organizations: Not on file    Relationship status: Not on file  Other Topics Concern  . Not on file  Social History Narrative  . Not on file     Family History: The patient's ***family history includes Colon cancer in his mother; Heart failure in his mother; Kidney cancer in his father and mother; Thyroid disease in his sister. ROS:   Please see the history of present illness.    All other systems reviewed and are negative.  EKGs/Labs/Other Studies Reviewed:    The following studies were reviewed today:  EKG:  EKG ordered today and personally reviewed.  The ekg ordered today demonstrates ***  Recent Labs: 10/21/2018: Magnesium 2.0 10/28/2018: ALT 26 11/03/2018: BUN 19; Creatinine, Ser 1.44; Hemoglobin 12.8; Platelets 450; Potassium 3.8; Sodium 136  Recent Lipid Panel    Component Value Date/Time   CHOL 191 10/16/2018 0250   TRIG 49 10/16/2018 0250   HDL 62 10/16/2018 0250   CHOLHDL 3.1 10/16/2018 0250   VLDL 10 10/16/2018 0250   LDLCALC 119 (H) 10/16/2018 0250    Physical Exam:    VS:  There were no vitals taken for this visit.    Wt Readings from Last 3 Encounters:  11/19/18 270 lb (122.5 kg)  11/07/18 264 lb 15.9 oz (120.2 kg)  10/27/18 277 lb 9 oz (125.9 kg)     GEN: *** Well nourished, well developed in no acute distress HEENT: Normal NECK: No JVD; No carotid bruits LYMPHATICS: No lymphadenopathy CARDIAC: ***RRR, no murmurs, rubs, gallops RESPIRATORY:  Clear to auscultation without rales, wheezing or rhonchi  ABDOMEN: Soft, non-tender, non-distended MUSCULOSKELETAL:  No edema; No deformity  SKIN: Warm and dry NEUROLOGIC:  Alert and oriented x 3 PSYCHIATRIC:  Normal affect    Signed, Shirlee More,  MD  12/16/2018 12:45 PM    [facility]

## 2018-12-16 NOTE — ED Triage Notes (Signed)
Patient reports worsening exertional dyspnea for several days with wheezing , history of COPD/CAD , he received Magnesium 2 grams IV/Solumedrol 125 mg IV and Epinepihrine 0.3 mg IM by EMS prior to arrival. Denies fever or chills .  Justin Bradford

## 2018-12-16 NOTE — ED Provider Notes (Signed)
TIME SEEN: 11:21 PM  CHIEF COMPLAINT: Shortness of breath  HPI: Patient is a 78 year old male with history of CAD, hypertension, CHF, COPD not on home oxygen who presents to the emergency department shortness of breath progressively worsened for 6 days.  No fevers or cough.  No chest pain or chest discomfort.  No lower extremity swelling or pain.  Does not wear oxygen at home.  Sats 88% on room air at rest with EMS.  Was given 125 mg of IV Solu-Medrol, 2 g of IV magnesium and 0.3 mg of IM epinephrine and states he is feeling much better.  Reports he has been wheezing.  Cath 10/16/18:   Prox RCA lesion is 20% stenosed.  Ost LAD to Prox LAD lesion is 100% stenosed.  Dist Cx lesion is 60% stenosed.  Post intervention, there is a 55% residual stenosis.  A stent was successfully placed.   Late presentation anterior wall myocardial infarction secondary to total proximal occlusion of a severely calcified proximal LAD with TIMI 0 flow.  Mild to moderate concomitant CAD with 60% distal AV groove circumflex stenosis and calcified dominant RCA with 20% proximal stenosis (the RCA was not optimally selectively engaged but visualization was adequate).  LVEDP 27 mmHg.  Difficult but successful PCI to a totally occluded severely calcified proximal LAD with PTCA, Cutting Balloon, and ultimate stenting with a 3.0 x 38 mm Resolute DES stent postdilated to 3.3 to 3.5 mm.  There was still moderate narrowing in the proximal portion of the stent which could not be optimally  expanded secondary to the significant residual calcification.  Aggrastat was administered due to transient low flow which ultimately normalized.  RECOMMENDATION: DAPT for minimum of 1 year.  Patient will be transported to [facility] for close observation.  He will need diuresis.  With his late presentation MI and revascularization, there is likelihood for significant residual myocardial damage.  Echo 12/10/18:   IMPRESSIONS    1. The  left ventricle has moderately reduced systolic function, with an ejection fraction of 35%. The cavity size was normal. There is moderate concentric left ventricular hypertrophy. Left ventricular diastolic Doppler parameters are indeterminate.  2. The right ventricle has normal systolic function. The cavity was normal. There is no increase in right ventricular wall thickness.  3. The mitral valve is degenerative. There is mild to moderate mitral annular calcification present. No evidence of mitral valve stenosis.  4. Mild thickening of the aortic valve. Mild focal calcification of the aortic valve. Aortic valve regurgitation is mild by color flow Doppler.  5. 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  6. Stage 1: 1: Multiple segmental abnormalities exist. See findings.   ROS: See HPI Constitutional: no fever  Eyes: no drainage  ENT: no runny nose   Cardiovascular:  no chest pain  Resp:  SOB  GI: no vomiting GU: no dysuria Integumentary: no rash  Allergy: no hives  Musculoskeletal: no leg swelling  Neurological: no slurred speech ROS otherwise negative  PAST MEDICAL HISTORY/PAST SURGICAL HISTORY:  Past Medical History:  Diagnosis Date  . Acute systolic heart failure (HCC)   . COPD (chronic obstructive pulmonary disease) (HCC)   . Heart attack (HCC)   . Hypertension   . Peripheral neuropathy     MEDICATIONS:  Prior to Admission medications   Medication Sig Start Date End Date Taking? Authorizing Provider  acetaminophen (TYLENOL) 325 MG tablet Take 2 tablets (650 mg total) by mouth every 4 (four) hours as needed  for headache or mild pain. 11/06/18   Angiulli, Mcarthur Rossetti, PA-C  amiodarone (PACERONE) 200 MG tablet Take 1 tablet (200 mg total) by mouth daily. 11/06/18   Angiulli, Mcarthur Rossetti, PA-C  aspirin EC 81 MG tablet Take 81 mg by mouth daily.    [provider]  atorvastatin (LIPITOR) 80 MG tablet Take 1 tablet (80 mg total) by mouth daily at 6 PM.  11/06/18   Angiulli, Mcarthur Rossetti, PA-C  carvedilol (COREG) 3.125 MG tablet Take 1 tablet (3.125 mg total) by mouth 2 (two) times daily with a meal for 30 days. 11/06/18 12/06/18  Angiulli, Mcarthur Rossetti, PA-C  clonazePAM (KLONOPIN) 1 MG tablet Take 1 tablet (1 mg total) by mouth at bedtime. 11/06/18   Angiulli, Mcarthur Rossetti, PA-C  FLUoxetine (PROZAC) 20 MG capsule Take 3 capsules (60 mg total) by mouth daily for 30 days. 11/06/18 12/06/18  Angiulli, Mcarthur Rossetti, PA-C  furosemide (LASIX) 80 MG tablet Take 1 tablet (80 mg total) by mouth daily for 30 days. 11/06/18 12/06/18  Angiulli, Mcarthur Rossetti, PA-C  lamoTRIgine (LAMICTAL) 25 MG tablet Take 3 tablets (75 mg total) by mouth at bedtime. 11/06/18   Angiulli, Mcarthur Rossetti, PA-C  losartan (COZAAR) 25 MG tablet Take 0.5 tablets (12.5 mg total) by mouth daily for 30 days. 11/06/18 12/06/18  Angiulli, Mcarthur Rossetti, PA-C  metFORMIN (GLUCOPHAGE) 500 MG tablet Take 0.5 tablets (250 mg total) by mouth every morning. 11/06/18   Angiulli, Mcarthur Rossetti, PA-C  pantoprazole (PROTONIX) 40 MG tablet Take 1 tablet (40 mg total) by mouth daily. 11/07/18   Angiulli, Mcarthur Rossetti, PA-C  potassium chloride SA (K-DUR) 20 MEQ tablet Take 2 tablets (40 mEq total) by mouth daily for 30 days. 11/06/18 12/06/18  Angiulli, Mcarthur Rossetti, PA-C  spironolactone (ALDACTONE) 25 MG tablet Take 0.5 tablets (12.5 mg total) by mouth daily for 30 days. 11/06/18 12/06/18  Angiulli, Mcarthur Rossetti, PA-C  tamsulosin (FLOMAX) 0.4 MG CAPS capsule Take 1 capsule (0.4 mg total) by mouth at bedtime. 11/06/18   Angiulli, Mcarthur Rossetti, PA-C    ALLERGIES:  No Known Allergies  SOCIAL HISTORY:  Social History   Tobacco Use  . Smoking status: Former Smoker    Types: Cigarettes  . Smokeless tobacco: Never Used  . Tobacco comment: quit in 2007  Substance Use Topics  . Alcohol use: Not Currently    FAMILY HISTORY: Family History  Problem Relation Age of Onset  . Colon cancer Mother   . Kidney cancer Mother   . Heart failure Mother   . Kidney cancer Father   . Thyroid  disease Sister     EXAM: BP 121/70 (BP Location: Right Arm)   Pulse (!) 103   Temp 98.1 F (36.7 C) (Temporal)   Resp (!) 28   Ht 5\' 10"  (1.778 m)   Wt 120 kg   SpO2 96%   BMI 37.96 kg/m  CONSTITUTIONAL: Alert and oriented and responds appropriately to questions.  Elderly, chronically ill-appearing HEAD: Normocephalic EYES: Conjunctivae clear, pupils appear equal, EOMI ENT: normal nose; moist mucous membranes NECK: Supple, no meningismus, no nuchal rigidity, no LAD; no JVD CARD: Regular and tachycardic; S1 and S2 appreciated; no murmurs, no clicks, no rubs, no gallops RESP:  Patient is tachypneic and speaking short sentences.  He appears dyspneic.  He has scattered expiratory wheezes.  Diminished at bases bilaterally.  No rhonchi or rales. ABD/GI: Normal bowel sounds; non-distended; soft, non-tender, no rebound, no guarding, no peritoneal signs, no hepatosplenomegaly BACK:  The  back appears normal and is non-tender to palpation, there is no CVA tenderness EXT: Normal ROM in all joints; non-tender to palpation; no edema; normal capillary refill; no cyanosis, no calf tenderness or swelling    SKIN: Normal color for age and race; warm; no rash NEURO: Moves all extremities equally PSYCH: The patient's mood and manner are appropriate. Grooming and personal hygiene are appropriate.  MEDICAL DECISION MAKING: Patient here with shortness of breath.  Suspect COPD exacerbation.  Will give albuterol and continue to closely monitor.  Will obtain cardiac labs, chest x-ray.  EKG shows right bundle branch block consistent with previous some changes in the inferior leads with a QRS morphology.  I have talked to cardiology on-call Dr. Dell Pontoarnecelli who has reviewed patient's EKG and agrees that it has changed but is consistent with previous and does not look like acute STEMI especially given patient's clinical picture points more likely to COPD exacerbation versus CHF.  We will continue to closely monitor  patient.  Patient states that he did not have chest pain with his previous NSTEMI but according to his H&P he did have chest pressure and sore throat along with dyspnea with exertion.  He is not having any chest discomfort at all today or over the past several days.  ED PROGRESS: Patient reports feeling much better after albuterol treatments.  His blood gas is reassuring.  His lactate is normal.  BNP mildly elevated.  Troponin mildly elevated.  Repeat EKG showed no significant change.  He still denies any chest pain.  His chest x-ray is concerning for bilateral pneumonia.  Given he had recent prolonged hospitalization and rehabilitation stay and was discharged on May 8, I will cover him for healthcare associated pneumonia.  He does have a leukocytosis of 18,000.  His COVID swab is negative.  Will discuss with medicine for admission.  He has been updated with this plan.  1:55 AM Discussed patient's case with hospitalist, Dr. Toniann FailKakrakandy.  I have recommended admission and patient (and family if present) agree with this plan. Admitting physician will place admission orders.   I reviewed all nursing notes, vitals, pertinent previous records, EKGs, lab and urine results, imaging (as available).     EKG Interpretation  Date/Time:  Tuesday December 16 2018 23:03:33 EDT Ventricular Rate:  103 PR Interval:    QRS Duration: 152 QT Interval:  499 QTC Calculation: 654 R Axis:   -91 Text Interpretation:  Ectopic atrial tachycardia, unifocal Right bundle branch block Confirmed by Rochele Raring,  [phone]([id number]) on 12/16/2018 11:26:13 PM         EKG Interpretation  Date/Time:  Wednesday December 17 2018 01:19:48 EDT Ventricular Rate:  104 PR Interval:    QRS Duration: 147 QT Interval:  487 QTC Calculation: 641 R Axis:   -91 Text Interpretation:  Normal sinus rhythm Right bundle branch block No significant change since last tracing Confirmed by Rochele Raring,  [phone]([id number]) on 12/17/2018 1:24:17 AM        CRITICAL  CARE Performed by: Baxter Hire    Total critical care time: 45 minutes  Critical care time was exclusive of separately billable procedures and treating other patients.  Critical care was necessary to treat or prevent imminent or life-threatening deterioration.  Critical care was time spent personally by me on the following activities: development of treatment plan with patient and/or surrogate as well as nursing, discussions with consultants, evaluation of patient's response to treatment, examination of patient, obtaining history from patient or surrogate, ordering and performing treatments and interventions,  ordering and review of laboratory studies, ordering and review of radiographic studies, pulse oximetry and re-evaluation of patient's condition.    , Delice Bison, DO 12/17/18 [phone]

## 2018-12-17 ENCOUNTER — Encounter (HOSPITAL_COMMUNITY): Payer: Self-pay | Admitting: Internal Medicine

## 2018-12-17 ENCOUNTER — Ambulatory Visit: Payer: Medicare Other | Admitting: Cardiology

## 2018-12-17 ENCOUNTER — Inpatient Hospital Stay (HOSPITAL_COMMUNITY): Payer: Medicare Other

## 2018-12-17 DIAGNOSIS — D649 Anemia, unspecified: Secondary | ICD-10-CM | POA: Diagnosis present

## 2018-12-17 DIAGNOSIS — I5022 Chronic systolic (congestive) heart failure: Secondary | ICD-10-CM

## 2018-12-17 DIAGNOSIS — Z6837 Body mass index (BMI) 37.0-37.9, adult: Secondary | ICD-10-CM | POA: Diagnosis not present

## 2018-12-17 DIAGNOSIS — J440 Chronic obstructive pulmonary disease with acute lower respiratory infection: Secondary | ICD-10-CM | POA: Diagnosis present

## 2018-12-17 DIAGNOSIS — F329 Major depressive disorder, single episode, unspecified: Secondary | ICD-10-CM | POA: Diagnosis present

## 2018-12-17 DIAGNOSIS — J189 Pneumonia, unspecified organism: Secondary | ICD-10-CM | POA: Diagnosis present

## 2018-12-17 DIAGNOSIS — J9601 Acute respiratory failure with hypoxia: Secondary | ICD-10-CM | POA: Diagnosis present

## 2018-12-17 DIAGNOSIS — I5043 Acute on chronic combined systolic (congestive) and diastolic (congestive) heart failure: Secondary | ICD-10-CM | POA: Diagnosis present

## 2018-12-17 DIAGNOSIS — Z7984 Long term (current) use of oral hypoglycemic drugs: Secondary | ICD-10-CM | POA: Diagnosis not present

## 2018-12-17 DIAGNOSIS — I252 Old myocardial infarction: Secondary | ICD-10-CM | POA: Diagnosis not present

## 2018-12-17 DIAGNOSIS — I480 Paroxysmal atrial fibrillation: Secondary | ICD-10-CM | POA: Diagnosis present

## 2018-12-17 DIAGNOSIS — J449 Chronic obstructive pulmonary disease, unspecified: Secondary | ICD-10-CM | POA: Diagnosis not present

## 2018-12-17 DIAGNOSIS — J441 Chronic obstructive pulmonary disease with (acute) exacerbation: Secondary | ICD-10-CM | POA: Diagnosis present

## 2018-12-17 DIAGNOSIS — Z8349 Family history of other endocrine, nutritional and metabolic diseases: Secondary | ICD-10-CM | POA: Diagnosis not present

## 2018-12-17 DIAGNOSIS — E876 Hypokalemia: Secondary | ICD-10-CM | POA: Diagnosis present

## 2018-12-17 DIAGNOSIS — Z20828 Contact with and (suspected) exposure to other viral communicable diseases: Secondary | ICD-10-CM | POA: Diagnosis present

## 2018-12-17 DIAGNOSIS — E669 Obesity, unspecified: Secondary | ICD-10-CM | POA: Diagnosis present

## 2018-12-17 DIAGNOSIS — R0603 Acute respiratory distress: Secondary | ICD-10-CM | POA: Diagnosis not present

## 2018-12-17 DIAGNOSIS — E785 Hyperlipidemia, unspecified: Secondary | ICD-10-CM | POA: Diagnosis present

## 2018-12-17 DIAGNOSIS — Z955 Presence of coronary angioplasty implant and graft: Secondary | ICD-10-CM | POA: Diagnosis not present

## 2018-12-17 DIAGNOSIS — E119 Type 2 diabetes mellitus without complications: Secondary | ICD-10-CM | POA: Diagnosis present

## 2018-12-17 DIAGNOSIS — I2510 Atherosclerotic heart disease of native coronary artery without angina pectoris: Secondary | ICD-10-CM | POA: Diagnosis present

## 2018-12-17 DIAGNOSIS — Z8249 Family history of ischemic heart disease and other diseases of the circulatory system: Secondary | ICD-10-CM | POA: Diagnosis not present

## 2018-12-17 DIAGNOSIS — Z87891 Personal history of nicotine dependence: Secondary | ICD-10-CM | POA: Diagnosis not present

## 2018-12-17 DIAGNOSIS — R0902 Hypoxemia: Secondary | ICD-10-CM | POA: Diagnosis present

## 2018-12-17 DIAGNOSIS — I110 Hypertensive heart disease with heart failure: Secondary | ICD-10-CM | POA: Diagnosis present

## 2018-12-17 DIAGNOSIS — F419 Anxiety disorder, unspecified: Secondary | ICD-10-CM | POA: Diagnosis present

## 2018-12-17 DIAGNOSIS — I712 Thoracic aortic aneurysm, without rupture: Secondary | ICD-10-CM | POA: Diagnosis present

## 2018-12-17 LAB — CBC WITH DIFFERENTIAL/PLATELET
Abs Immature Granulocytes: 0.1 10*3/uL — ABNORMAL HIGH (ref 0.00–0.07)
Basophils Absolute: 0 10*3/uL (ref 0.0–0.1)
Basophils Relative: 0 %
Eosinophils Absolute: 0 10*3/uL (ref 0.0–0.5)
Eosinophils Relative: 0 %
HCT: 38.5 % — ABNORMAL LOW (ref 39.0–52.0)
Hemoglobin: 12.2 g/dL — ABNORMAL LOW (ref 13.0–17.0)
Immature Granulocytes: 1 %
Lymphocytes Relative: 2 %
Lymphs Abs: 0.4 10*3/uL — ABNORMAL LOW (ref 0.7–4.0)
MCH: 29.8 pg (ref 26.0–34.0)
MCHC: 31.7 g/dL (ref 30.0–36.0)
MCV: 94.1 fL (ref 80.0–100.0)
Monocytes Absolute: 0.5 10*3/uL (ref 0.1–1.0)
Monocytes Relative: 3 %
Neutro Abs: 14.4 10*3/uL — ABNORMAL HIGH (ref 1.7–7.7)
Neutrophils Relative %: 94 %
Platelets: 344 10*3/uL (ref 150–400)
RBC: 4.09 MIL/uL — ABNORMAL LOW (ref 4.22–5.81)
RDW: 14.7 % (ref 11.5–15.5)
WBC: 15.4 10*3/uL — ABNORMAL HIGH (ref 4.0–10.5)
nRBC: 0 % (ref 0.0–0.2)

## 2018-12-17 LAB — TSH: TSH: 3.195 u[IU]/mL (ref 0.350–4.500)

## 2018-12-17 LAB — COMPREHENSIVE METABOLIC PANEL
ALT: 14 U/L (ref 0–44)
ALT: 15 U/L (ref 0–44)
AST: 19 U/L (ref 15–41)
AST: 19 U/L (ref 15–41)
Albumin: 3.3 g/dL — ABNORMAL LOW (ref 3.5–5.0)
Albumin: 3.1 g/dL — ABNORMAL LOW (ref 3.5–5.0)
Alkaline Phosphatase: 88 U/L (ref 38–126)
Alkaline Phosphatase: 85 U/L (ref 38–126)
Anion gap: 14 (ref 5–15)
Anion gap: 11 (ref 5–15)
BUN: 10 mg/dL (ref 8–23)
BUN: 11 mg/dL (ref 8–23)
CO2: 16 mmol/L — ABNORMAL LOW (ref 22–32)
CO2: 19 mmol/L — ABNORMAL LOW (ref 22–32)
Calcium: 8.6 mg/dL — ABNORMAL LOW (ref 8.9–10.3)
Calcium: 8.5 mg/dL — ABNORMAL LOW (ref 8.9–10.3)
Chloride: 104 mmol/L (ref 98–111)
Chloride: 103 mmol/L (ref 98–111)
Creatinine, Ser: 0.97 mg/dL (ref 0.61–1.24)
Creatinine, Ser: 1.15 mg/dL (ref 0.61–1.24)
GFR calc Af Amer: >60 mL/min (ref >60)
GFR calc Af Amer: >60 mL/min (ref >60)
GFR calc non Af Amer: >60 mL/min (ref >60)
GFR calc non Af Amer: >60 mL/min (ref >60)
Glucose, Bld: 160 mg/dL — ABNORMAL HIGH (ref 70–99)
Glucose, Bld: 267 mg/dL — ABNORMAL HIGH (ref 70–99)
Potassium: 4.3 mmol/L (ref 3.5–5.1)
Potassium: 3.9 mmol/L (ref 3.5–5.1)
Sodium: 134 mmol/L — ABNORMAL LOW (ref 135–145)
Sodium: 133 mmol/L — ABNORMAL LOW (ref 135–145)
Total Bilirubin: 1.4 mg/dL — ABNORMAL HIGH (ref 0.3–1.2)
Total Bilirubin: 1.3 mg/dL — ABNORMAL HIGH (ref 0.3–1.2)
Total Protein: 6.9 g/dL (ref 6.5–8.1)
Total Protein: 6.5 g/dL (ref 6.5–8.1)

## 2018-12-17 LAB — LACTIC ACID, PLASMA: Lactic Acid, Venous: 1.4 mmol/L (ref 0.5–1.9)

## 2018-12-17 LAB — POCT I-STAT 7, (LYTES, BLD GAS, ICA,H+H)
Acid-base deficit: 4 mmol/L — ABNORMAL HIGH (ref 0.0–2.0)
Bicarbonate: 19.5 mmol/L — ABNORMAL LOW (ref 20.0–28.0)
Calcium, Ion: 1.21 mmol/L (ref 1.15–1.40)
HCT: 38 % — ABNORMAL LOW (ref 39.0–52.0)
Hemoglobin: 12.9 g/dL — ABNORMAL LOW (ref 13.0–17.0)
O2 Saturation: 95 %
Patient temperature: 97.4
Potassium: 4.2 mmol/L (ref 3.5–5.1)
Sodium: 134 mmol/L — ABNORMAL LOW (ref 135–145)
TCO2: 20 mmol/L — ABNORMAL LOW (ref 22–32)
pCO2 arterial: 31.2 mmHg — ABNORMAL LOW (ref 32.0–48.0)
pH, Arterial: 7.402 (ref 7.350–7.450)
pO2, Arterial: 72 mmHg — ABNORMAL LOW (ref 83.0–108.0)

## 2018-12-17 LAB — TROPONIN I
Troponin I: 0.06 ng/mL (ref <0.03)
Troponin I: 0.18 ng/mL (ref <0.03)
Troponin I: 0.18 ng/mL (ref <0.03)
Troponin I: 0.17 ng/mL (ref <0.03)

## 2018-12-17 LAB — GLUCOSE, CAPILLARY
Glucose-Capillary: 136 mg/dL — ABNORMAL HIGH (ref 70–99)
Glucose-Capillary: 173 mg/dL — ABNORMAL HIGH (ref 70–99)

## 2018-12-17 LAB — CBG MONITORING, ED
Glucose-Capillary: 224 mg/dL — ABNORMAL HIGH (ref 70–99)
Glucose-Capillary: 219 mg/dL — ABNORMAL HIGH (ref 70–99)

## 2018-12-17 LAB — SARS CORONAVIRUS 2: SARS Coronavirus 2: NOT DETECTED

## 2018-12-17 LAB — MAGNESIUM: Magnesium: 2.2 mg/dL (ref 1.7–2.4)

## 2018-12-17 LAB — BRAIN NATRIURETIC PEPTIDE: B Natriuretic Peptide: 916.8 pg/mL — ABNORMAL HIGH (ref 0.0–100.0)

## 2018-12-17 MED ORDER — INSULIN ASPART 100 UNIT/ML ~~LOC~~ SOLN
0.0000 [IU] | Freq: Three times a day (TID) | SUBCUTANEOUS | Status: DC
  Administered 2018-12-17: 1 [IU] via SUBCUTANEOUS
  Administered 2018-12-17 (×2): 3 [IU] via SUBCUTANEOUS
  Administered 2018-12-18 – 2018-12-19 (×5): 2 [IU] via SUBCUTANEOUS
  Administered 2018-12-19: 3 [IU] via SUBCUTANEOUS
  Administered 2018-12-20 (×3): 2 [IU] via SUBCUTANEOUS
  Administered 2018-12-21 (×3): 3 [IU] via SUBCUTANEOUS
  Administered 2018-12-22: 5 [IU] via SUBCUTANEOUS
  Administered 2018-12-22: 2 [IU] via SUBCUTANEOUS
  Administered 2018-12-22: 3 [IU] via SUBCUTANEOUS
  Administered 2018-12-23: 2 [IU] via SUBCUTANEOUS
  Administered 2018-12-23: 3 [IU] via SUBCUTANEOUS
  Administered 2018-12-23: 2 [IU] via SUBCUTANEOUS
  Administered 2018-12-24: 3 [IU] via SUBCUTANEOUS
  Administered 2018-12-24 – 2018-12-25 (×3): 1 [IU] via SUBCUTANEOUS

## 2018-12-17 MED ORDER — ALBUTEROL SULFATE HFA 108 (90 BASE) MCG/ACT IN AERS
2.0000 | INHALATION_SPRAY | Freq: Four times a day (QID) | RESPIRATORY_TRACT | Status: DC
  Administered 2018-12-17 – 2018-12-19 (×6): 2 via RESPIRATORY_TRACT
  Filled 2018-12-17: qty 6.7

## 2018-12-17 MED ORDER — AMIODARONE HCL 200 MG PO TABS
200.0000 mg | ORAL_TABLET | Freq: Every day | ORAL | Status: DC
  Administered 2018-12-17 – 2018-12-18 (×2): 200 mg via ORAL
  Filled 2018-12-17 (×2): qty 1

## 2018-12-17 MED ORDER — ACETAMINOPHEN 325 MG PO TABS: 650.0000 mg | ORAL_TABLET | Freq: Four times a day (QID) | ORAL | Status: DC | PRN

## 2018-12-17 MED ORDER — CARVEDILOL 3.125 MG PO TABS
3.1250 mg | ORAL_TABLET | Freq: Two times a day (BID) | ORAL | Status: DC
  Administered 2018-12-17 – 2018-12-25 (×17): 3.125 mg via ORAL
  Filled 2018-12-17 (×19): qty 1

## 2018-12-17 MED ORDER — AEROCHAMBER PLUS FLO-VU LARGE MISC
Status: AC
  Administered 2018-12-17: 08:00:00
  Filled 2018-12-17: qty 1

## 2018-12-17 MED ORDER — CLONAZEPAM 1 MG PO TABS
1.0000 mg | ORAL_TABLET | Freq: Every day | ORAL | Status: DC
  Administered 2018-12-17 – 2018-12-24 (×8): 1 mg via ORAL
  Filled 2018-12-17 (×8): qty 1

## 2018-12-17 MED ORDER — SODIUM CHLORIDE 0.9 % IV SOLN
2.0000 g | Freq: Once | INTRAVENOUS | Status: AC
  Administered 2018-12-17: 2 g via INTRAVENOUS
  Filled 2018-12-17: qty 2

## 2018-12-17 MED ORDER — METHYLPREDNISOLONE SODIUM SUCC 40 MG IJ SOLR
40.0000 mg | Freq: Two times a day (BID) | INTRAMUSCULAR | Status: DC
  Administered 2018-12-17 – 2018-12-19 (×5): 40 mg via INTRAVENOUS
  Filled 2018-12-17 (×5): qty 1

## 2018-12-17 MED ORDER — POTASSIUM CHLORIDE CRYS ER 20 MEQ PO TBCR
40.0000 meq | EXTENDED_RELEASE_TABLET | Freq: Every day | ORAL | Status: DC
  Administered 2018-12-17 – 2018-12-25 (×9): 40 meq via ORAL
  Filled 2018-12-17 (×9): qty 2

## 2018-12-17 MED ORDER — PANTOPRAZOLE SODIUM 40 MG PO TBEC
40.0000 mg | DELAYED_RELEASE_TABLET | Freq: Every day | ORAL | Status: DC
  Administered 2018-12-17 – 2018-12-25 (×9): 40 mg via ORAL
  Filled 2018-12-17 (×9): qty 1

## 2018-12-17 MED ORDER — SODIUM CHLORIDE 0.9 % IV SOLN
INTRAVENOUS | Status: DC | PRN
  Administered 2018-12-17: 500 mL via INTRAVENOUS

## 2018-12-17 MED ORDER — ALBUTEROL SULFATE (2.5 MG/3ML) 0.083% IN NEBU
2.5000 mg | INHALATION_SOLUTION | RESPIRATORY_TRACT | Status: DC
  Administered 2018-12-17: 2.5 mg via RESPIRATORY_TRACT
  Filled 2018-12-17: qty 3

## 2018-12-17 MED ORDER — LAMOTRIGINE 25 MG PO TABS
75.0000 mg | ORAL_TABLET | Freq: Every day | ORAL | Status: DC
  Administered 2018-12-17 – 2018-12-24 (×8): 75 mg via ORAL
  Filled 2018-12-17 (×9): qty 3

## 2018-12-17 MED ORDER — SPIRONOLACTONE 12.5 MG HALF TABLET
12.5000 mg | ORAL_TABLET | Freq: Every day | ORAL | Status: DC
  Administered 2018-12-17 – 2018-12-25 (×9): 12.5 mg via ORAL
  Filled 2018-12-17 (×9): qty 1

## 2018-12-17 MED ORDER — IOHEXOL 350 MG/ML SOLN
100.0000 mL | Freq: Once | INTRAVENOUS | Status: AC | PRN
  Administered 2018-12-17: 75 mL via INTRAVENOUS

## 2018-12-17 MED ORDER — ONDANSETRON HCL 4 MG/2ML IJ SOLN: 4.0000 mg | Freq: Four times a day (QID) | INTRAMUSCULAR | Status: DC | PRN

## 2018-12-17 MED ORDER — ALBUTEROL SULFATE HFA 108 (90 BASE) MCG/ACT IN AERS
2.0000 | INHALATION_SPRAY | Freq: Once | RESPIRATORY_TRACT | Status: DC
  Administered 2018-12-17: 2 via RESPIRATORY_TRACT

## 2018-12-17 MED ORDER — ORAL CARE MOUTH RINSE
15.0000 mL | Freq: Two times a day (BID) | OROMUCOSAL | Status: DC
  Administered 2018-12-18 – 2018-12-25 (×12): 15 mL via OROMUCOSAL

## 2018-12-17 MED ORDER — TAMSULOSIN HCL 0.4 MG PO CAPS
0.4000 mg | ORAL_CAPSULE | Freq: Every day | ORAL | Status: DC
  Administered 2018-12-17 – 2018-12-24 (×8): 0.4 mg via ORAL
  Filled 2018-12-17 (×8): qty 1

## 2018-12-17 MED ORDER — ACETAMINOPHEN 650 MG RE SUPP: 650.0000 mg | Freq: Four times a day (QID) | RECTAL | Status: DC | PRN

## 2018-12-17 MED ORDER — IPRATROPIUM BROMIDE 0.02 % IN SOLN
0.5000 mg | RESPIRATORY_TRACT | Status: DC
  Administered 2018-12-17: 0.5 mg via RESPIRATORY_TRACT
  Filled 2018-12-17: qty 2.5

## 2018-12-17 MED ORDER — VANCOMYCIN HCL IN DEXTROSE 1-5 GM/200ML-% IV SOLN
1000.0000 mg | Freq: Once | INTRAVENOUS | Status: AC
  Administered 2018-12-17: 1000 mg via INTRAVENOUS
  Filled 2018-12-17: qty 200

## 2018-12-17 MED ORDER — SODIUM CHLORIDE 0.9 % IV SOLN
1.0000 g | INTRAVENOUS | Status: AC
  Administered 2018-12-17 – 2018-12-23 (×7): 1 g via INTRAVENOUS
  Filled 2018-12-17 (×7): qty 1

## 2018-12-17 MED ORDER — BUDESONIDE 0.25 MG/2ML IN SUSP
0.2500 mg | Freq: Two times a day (BID) | RESPIRATORY_TRACT | Status: DC
  Administered 2018-12-17 – 2018-12-19 (×4): 0.25 mg via RESPIRATORY_TRACT
  Filled 2018-12-17 (×6): qty 2

## 2018-12-17 MED ORDER — ONDANSETRON HCL 4 MG PO TABS: 4.0000 mg | ORAL_TABLET | Freq: Four times a day (QID) | ORAL | Status: DC | PRN

## 2018-12-17 MED ORDER — ENOXAPARIN SODIUM 40 MG/0.4ML ~~LOC~~ SOLN
40.0000 mg | SUBCUTANEOUS | Status: DC
  Administered 2018-12-17 – 2018-12-24 (×8): 40 mg via SUBCUTANEOUS
  Filled 2018-12-17 (×10): qty 0.4

## 2018-12-17 MED ORDER — ATORVASTATIN CALCIUM 80 MG PO TABS
80.0000 mg | ORAL_TABLET | Freq: Every day | ORAL | Status: DC
  Administered 2018-12-17 – 2018-12-24 (×8): 80 mg via ORAL
  Filled 2018-12-17 (×9): qty 1

## 2018-12-17 MED ORDER — SODIUM CHLORIDE 0.9 % IV SOLN
500.0000 mg | INTRAVENOUS | Status: DC
  Administered 2018-12-17 – 2018-12-18 (×2): 500 mg via INTRAVENOUS
  Filled 2018-12-17 (×4): qty 500

## 2018-12-17 MED ORDER — LOSARTAN POTASSIUM 25 MG PO TABS
12.5000 mg | ORAL_TABLET | Freq: Every day | ORAL | Status: DC
  Administered 2018-12-17 – 2018-12-22 (×6): 12.5 mg via ORAL
  Filled 2018-12-17 (×4): qty 1
  Filled 2018-12-17: qty 0.5
  Filled 2018-12-17: qty 1

## 2018-12-17 MED ORDER — ALBUTEROL SULFATE HFA 108 (90 BASE) MCG/ACT IN AERS
8.0000 | INHALATION_SPRAY | Freq: Once | RESPIRATORY_TRACT | Status: AC
  Administered 2018-12-17: 8 via RESPIRATORY_TRACT
  Filled 2018-12-17: qty 6.7

## 2018-12-17 MED ORDER — ASPIRIN EC 81 MG PO TBEC
81.0000 mg | DELAYED_RELEASE_TABLET | Freq: Every day | ORAL | Status: DC
  Administered 2018-12-17 – 2018-12-25 (×9): 81 mg via ORAL
  Filled 2018-12-17 (×9): qty 1

## 2018-12-17 MED ORDER — ALBUTEROL SULFATE (2.5 MG/3ML) 0.083% IN NEBU
2.5000 mg | INHALATION_SOLUTION | RESPIRATORY_TRACT | Status: DC | PRN
  Administered 2018-12-18 (×2): 2.5 mg via RESPIRATORY_TRACT
  Filled 2018-12-17 (×2): qty 3

## 2018-12-17 MED ORDER — FUROSEMIDE 20 MG PO TABS: 80.0000 mg | ORAL_TABLET | Freq: Every day | ORAL | Status: DC

## 2018-12-17 MED ORDER — FUROSEMIDE 10 MG/ML IJ SOLN
40.0000 mg | Freq: Two times a day (BID) | INTRAMUSCULAR | Status: DC
  Administered 2018-12-17 – 2018-12-18 (×3): 40 mg via INTRAVENOUS
  Filled 2018-12-17 (×3): qty 4

## 2018-12-17 NOTE — ED Notes (Signed)
ED TO INPATIENT HANDOFF REPORT  ED Nurse Name and Phone #: Britta MccreedyMichele Briana Farner RN  S Name/Age/Gender Justin PoseyBruce D Adkins Jr. 78 y.o. male Room/Bed: [id number]/[id number]  Code Status   Code Status: Full Code  Home/SNF/Other Home Patient oriented to: self, place, time and situation Is this baseline? Yes   Triage Complete: Triage complete  Chief Complaint chest pain  Triage Note Patient reports worsening exertional dyspnea for several days with wheezing , history of COPD/CAD , he received Magnesium 2 grams IV/Solumedrol 125 mg IV and Epinepihrine 0.3 mg IM by EMS prior to arrival. Denies fever or chills .  Marland Kitchen.    Allergies No Known Allergies  Level of Care/Admitting Diagnosis ED Disposition    ED Disposition Condition Comment   Admit  [facility] [[id number]]  Level of Care: Progressive [102]  Covid Evaluation: N/A  Diagnosis: Acute respiratory failure with hypoxia [facility](HCC) [[id number]]) [[id number]]  Admitting Physician: Eduard ClosKAKRAKANDY, ARSHAD N [phone]]  Attending Physician: Eduard ClosKAKRAKANDY, ARSHAD N [phone]]  Estimated length of stay: past midnight tomorrow  Certification:: I certify this patient will need inpatient services for at least 2 midnights  PT Class (Do Not Modify): Inpatient [101]  PT Acc Code (Do Not Modify): Private [1]       B Medical/Surgery History Past Medical History:  Diagnosis Date  . Acute systolic heart failure (HCC)   . COPD (chronic obstructive pulmonary disease) (HCC)   . Heart attack (HCC)   . Hypertension   . Peripheral neuropathy    Past Surgical History:  Procedure Laterality Date  . CORONARY STENT INTERVENTION N/A 10/16/2018   Procedure: CORONARY STENT INTERVENTION;  Surgeon: Lennette BihariKelly, Thomas A, MD;  [facility];  Service: Cardiovascular;  Laterality: N/A;  . KNEE SURGERY  1994 & 1998  . LEFT HEART CATH AND CORONARY ANGIOGRAPHY N/A 10/16/2018   Procedure: LEFT HEART CATH AND CORONARY ANGIOGRAPHY;  Surgeon: Lennette BihariKelly, Thomas A, MD;  Location: [facility]  INVASIVE CV LAB;  Service: Cardiovascular;  Laterality: N/A;  . TONSILLECTOMY       A IV Location/Drains/Wounds Patient Lines/Drains/Airways Status   Active Line/Drains/Airways    Name:   Placement date:   Placement time:   Site:   Days:   Peripheral IV 12/16/18 Left Antecubital   12/16/18    -    Antecubital   1          Intake/Output Last 24 hours No intake or output data in the 24 hours ending 12/17/18 1257  Labs/Imaging Results for orders placed or performed during the hospital encounter of 12/16/18 (from the past 48 hour(s))  SARS Coronavirus 2     Status: None   Collection Time: 12/16/18 11:00 PM  Result Value Ref Range   SARS Coronavirus 2 NOT DETECTED NOT DETECTED    Comment: (NOTE) SARS-CoV-2 target nucleic acids are NOT DETECTED. The SARS-CoV-2 RNA is generally detectable in upper and lower respiratory specimens during the acute phase of infection.  Negative  results do not preclude SARS-CoV-2 infection, do not rule out co-infections with other pathogens, and should not be used as the sole basis for treatment or other patient management decisions.  Negative results must be combined with clinical observations, patient history, and epidemiological information. The expected result is Not Detected. Fact Sheet for Patients: http://www.biofiredefense.com/wp-content/uploads/2020/03/BIOFIRE-COVID -19-patients.pdf Fact Sheet for Healthcare Providers: http://www.biofiredefense.com/wp-content/uploads/2020/03/BIOFIRE-COVID -19-hcp.pdf This test is not yet approved or cleared by the [place] FDA and  has been authorized for detection and/or diagnosis of SARS-CoV-2 by FDA under  an Emergency Use Authorization (EUA).  This EUA will remain in effec t (meaning this test can be used) for the duration of  the COVID-19 declaration under Section 564(b)(1) of the Act, 21 U.S.C. section 360bbb-3(b)(1), unless the authorization is terminated or revoked sooner. Performed at [facility], [address], [place], [place] [address]   Troponin I - Once     Status: Abnormal   Collection Time: 12/16/18 11:20 PM  Result Value Ref Range   Troponin I 0.06 (HH) <0.03 ng/mL    Comment: CRITICAL RESULT CALLED TO, READ BACK BY AND VERIFIED WITH: SANGALANG,R RN 12/17/2018 0038 JORDANS Performed at [facility], [address] [place], [place] [address]   CBC with Differential     Status: Abnormal   Collection Time: 12/16/18 11:20 PM  Result Value Ref Range   WBC 18.0 (H) 4.0 - 10.5 K/uL   RBC 4.18 (L) 4.22 - 5.81 MIL/uL   Hemoglobin 12.5 (L) 13.0 - 17.0 g/dL   HCT 61.2 (L) 24.4 - 97.5 %   MCV 93.1 80.0 - 100.0 fL   MCH 29.9 26.0 - 34.0 pg   MCHC 32.1 30.0 - 36.0 g/dL   RDW 30.0 51.1 - 02.1 %   Platelets 382 150 - 400 K/uL   nRBC 0.0 0.0 - 0.2 %   Neutrophils Relative % 77 %   Neutro Abs 13.8 (H) 1.7 - 7.7 K/uL   Lymphocytes Relative 11 %   Lymphs Abs 2.0 0.7 - 4.0 K/uL   Monocytes Relative 10 %   Monocytes Absolute 1.8 (H) 0.1 - 1.0 K/uL   Eosinophils Relative 1 %   Eosinophils Absolute 0.2 0.0 - 0.5 K/uL   Basophils Relative 1 %   Basophils Absolute 0.1 0.0 - 0.1 K/uL   Immature Granulocytes 0 %   Abs Immature Granulocytes 0.08 (H) 0.00 - 0.07 K/uL    Comment: Performed at [facility], [address], [place], [place] [address]  Comprehensive metabolic panel     Status: Abnormal   Collection Time: 12/16/18 11:20 PM  Result Value Ref Range   Sodium 134 (L) 135 - 145 mmol/L   Potassium 4.3 3.5 - 5.1 mmol/L   Chloride 104 98 - 111 mmol/L   CO2 16 (L) 22 - 32 mmol/L   Glucose, Bld 160 (H) 70 - 99 mg/dL   BUN 10 8 - 23 mg/dL   Creatinine, Ser 6.70 0.61 - 1.24 mg/dL   Calcium 8.6 (L) 8.9 - 10.3 mg/dL   Total Protein 6.9 6.5 - 8.1 g/dL   Albumin 3.3 (L) 3.5 - 5.0 g/dL   AST 19 15 - 41 U/L   ALT 14 0 - 44 U/L   Alkaline Phosphatase 88 38 - 126 U/L   Total Bilirubin 1.4 (H) 0.3 - 1.2 mg/dL   GFR calc non Af Amer >60 >60 mL/min   GFR calc  Af Amer >60 >60 mL/min   Anion gap 14 5 - 15    Comment: Performed at [facility], [address] [place], [place] [address]  Brain natriuretic peptide     Status: Abnormal   Collection Time: 12/16/18 11:21 PM  Result Value Ref Range   B Natriuretic Peptide 916.8 (H) 0.0 - 100.0 pg/mL    Comment: Performed at [facility], [address] [place], [place] [address]  Lactic acid, plasma     Status: None   Collection Time: 12/17/18 12:24 AM  Result Value Ref Range   Lactic Acid, Venous 1.4 0.5 - 1.9 mmol/L    Comment: Performed at [facility], [address] [place], [place] [address]  I-STAT 7, (LYTES, BLD GAS, ICA, H+H)     Status: Abnormal   Collection Time: 12/17/18  1:06 AM  Result Value Ref Range   pH, Arterial 7.402 7.350 - 7.450   pCO2 arterial 31.2 (L) 32.0 - 48.0 mmHg   pO2, Arterial 72.0 (L) 83.0 - 108.0 mmHg   Bicarbonate 19.5 (L) 20.0 - 28.0 mmol/L   TCO2 20 (L) 22 - 32 mmol/L   O2 Saturation 95.0 %   Acid-base deficit 4.0 (H) 0.0 - 2.0 mmol/L   Sodium 134 (L) 135 - 145 mmol/L   Potassium 4.2 3.5 - 5.1 mmol/L   Calcium, Ion 1.21 1.15 - 1.40 mmol/L   HCT 38.0 (L) 39.0 - 52.0 %   Hemoglobin 12.9 (L) 13.0 - 17.0 g/dL   Patient temperature 60.4 F    Collection site RADIAL, ALLEN'S TEST ACCEPTABLE    Drawn by RT    Sample type ARTERIAL   Comprehensive metabolic panel     Status: Abnormal   Collection Time: 12/17/18  5:07 AM  Result Value Ref Range   Sodium 133 (L) 135 - 145 mmol/L   Potassium 3.9 3.5 - 5.1 mmol/L   Chloride 103 98 - 111 mmol/L   CO2 19 (L) 22 - 32 mmol/L   Glucose, Bld 267 (H) 70 - 99 mg/dL   BUN 11 8 - 23 mg/dL   Creatinine, Ser 5.40 0.61 - 1.24 mg/dL   Calcium 8.5 (L) 8.9 - 10.3 mg/dL   Total Protein 6.5 6.5 - 8.1 g/dL   Albumin 3.1 (L) 3.5 - 5.0 g/dL   AST 19 15 - 41 U/L   ALT 15 0 - 44 U/L   Alkaline Phosphatase 85 38 - 126 U/L   Total Bilirubin 1.3 (H) 0.3 - 1.2 mg/dL   GFR calc non Af Amer >60 >60 mL/min   GFR calc  Af Amer >60 >60 mL/min   Anion gap 11 5 - 15    Comment: Performed at [facility], [address] [place], [place] [address]  CBC WITH DIFFERENTIAL     Status: Abnormal   Collection Time: 12/17/18  5:07 AM  Result Value Ref Range   WBC 15.4 (H) 4.0 - 10.5 K/uL   RBC 4.09 (L) 4.22 - 5.81 MIL/uL   Hemoglobin 12.2 (L) 13.0 - 17.0 g/dL   HCT 14.7 (L) 82.9 - 56.2 %   MCV 94.1 80.0 - 100.0 fL   MCH 29.8 26.0 - 34.0 pg   MCHC 31.7 30.0 - 36.0 g/dL   RDW 13.0 86.5 - 78.4 %   Platelets 344 150 - 400 K/uL   nRBC 0.0 0.0 - 0.2 %   Neutrophils Relative % 94 %   Neutro Abs 14.4 (H) 1.7 - 7.7 K/uL   Lymphocytes Relative 2 %   Lymphs Abs 0.4 (L) 0.7 - 4.0 K/uL   Monocytes Relative 3 %   Monocytes Absolute 0.5 0.1 - 1.0 K/uL   Eosinophils Relative 0 %   Eosinophils Absolute 0.0 0.0 - 0.5 K/uL   Basophils Relative 0 %   Basophils Absolute 0.0 0.0 - 0.1 K/uL   Immature Granulocytes 1 %   Abs Immature Granulocytes 0.10 (H) 0.00 - 0.07 K/uL    Comment: Performed at [facility], [address] [place],  [place] [id number]  TSH     Status: None   Collection Time: 12/17/18  5:07 AM  Result Value Ref Range   TSH 3.195 0.350 - 4.500 uIU/mL    Comment: Performed by a 3rd Generation assay with a functional sensitivity of <=0.01 uIU/mL. Performed at [facility], [address] [place], [place] [address]   Magnesium     Status: None   Collection Time: 12/17/18  5:07 AM  Result Value Ref Range   Magnesium 2.2 1.7 - 2.4 mg/dL    Comment: Performed at [facility], [address] [place], [place] [address]  Troponin I - Now Then Q6H     Status: Abnormal   Collection Time: 12/17/18  5:07 AM  Result Value Ref Range   Troponin I 0.18 (HH) <0.03 ng/mL    Comment: CRITICAL VALUE NOTED.  VALUE IS CONSISTENT WITH PREVIOUSLY REPORTED AND CALLED VALUE. Performed at [facility], [address] [place], [place] [address]   CBG monitoring, ED     Status: Abnormal    Collection Time: 12/17/18  8:37 AM  Result Value Ref Range   Glucose-Capillary 224 (H) 70 - 99 mg/dL  Troponin I - Now Then Q6H     Status: Abnormal   Collection Time: 12/17/18 10:56 AM  Result Value Ref Range   Troponin I 0.18 (HH) <0.03 ng/mL    Comment: CRITICAL VALUE NOTED.  VALUE IS CONSISTENT WITH PREVIOUSLY REPORTED AND CALLED VALUE. Performed at [facility], [address] [place], [place] [address]   CBG monitoring, ED     Status: Abnormal   Collection Time: 12/17/18 11:44 AM  Result Value Ref Range   Glucose-Capillary 219 (H) 70 - 99 mg/dL   Ct Angio Chest Pe W Or Wo Contrast  Result Date: 12/17/2018 CLINICAL DATA:  Shortness of breath for 1 week. Symptoms became worse yesterday. EXAM: CT ANGIOGRAPHY CHEST WITH CONTRAST TECHNIQUE: Multidetector CT imaging of the chest was performed using the standard protocol during bolus administration of intravenous contrast. Multiplanar CT image reconstructions and MIPs were obtained to evaluate the vascular anatomy. CONTRAST:  75mL OMNIPAQUE IOHEXOL 350 MG/ML SOLN COMPARISON:  One-view chest x-ray 12/18/2018. CT of the chest 10/23/2018 FINDINGS: Cardiovascular: The heart size is normal. Coronary artery calcifications are present in the LAD and right circumflex artery. No significant pericardial effusion is present. Atherosclerotic changes are noted in the aorta and great vessel origins. There is no aortic aneurysm. No significant stenosis is evident at the origins of the great vessels. Pulmonary artery opacification is excellent. No focal filling defects are present to suggest pulmonary embolus. Mediastinum/Nodes: A right paratracheal node near the carina measures 12 mm in short axis. Small prevascular nodes are present. Other subcentimeter paratracheal nodes are present. Asymmetric right hilar adenopathy is noted. Lungs/Pleura: Diffuse patchy bilateral airspace opacities are present. Moderate bilateral pleural effusions are noted. Upper  Abdomen: Visualized upper abdomen is unremarkable. Musculoskeletal: Vertebral body heights and alignment are maintained. No focal lytic or blastic lesions are present. Sternum is unremarkable. Ribs are within normal limits bilaterally. Review of the MIP images confirms the above findings. IMPRESSION: 1. No pulmonary embolus. 2. Diffuse patchy airspace disease bilaterally. Findings are most concerning for infection. 3. Bilateral pleural effusions. Airspace disease could represent edema and heart failure. 4.  Aortic Atherosclerosis (ICD10-I70.0). 5. Coronary artery disease. Electronically Signed   By: Marin Robertshristopher  Mattern M.D.   On: 12/17/2018 07:06   Dg Chest Portable 1 View  Result Date: 12/16/2018 CLINICAL DATA:  Shortness of breath EXAM: PORTABLE CHEST 1 VIEW COMPARISON:  10/23/2018 FINDINGS: Heart is borderline in size. Worsening airspace disease in the lower lobes, right greater than left concerning for pneumonia. Improved aeration in the right upper lobe since prior study. Possible small right effusion. No acute bony abnormality. IMPRESSION: Bilateral lower lobe airspace opacities, most confluent in the right lower lobe concerning for pneumonia. Electronically Signed   By: Rolm Baptise M.D.   On: 12/16/2018 23:33    Pending Labs Unresulted Labs (From admission, onward)    Start     Ordered   12/24/18 0500  Creatinine, serum  (enoxaparin (LOVENOX)    CrCl >/= 30 ml/min)  Weekly,   R    Comments: while on enoxaparin therapy    12/17/18 0456   12/17/18 0906  Novel Coronavirus,NAA,(SEND-OUT TO REF LAB - TAT 24-48 hrs); Hosp Order  (Symptomatic Patients Labs with Precautions )  Once,   AD    Question Answer Comment  Current symptoms Fever and Shortness of breath   Excluded other viral illnesses No (testing not indicated)      12/17/18 0906   12/17/18 0456  Troponin I - Now Then Q6H  Now then every 6 hours,   R (with STAT occurrences)     12/17/18 0456   12/17/18 0453  CBC  (enoxaparin (LOVENOX)     CrCl >/= 30 ml/min)  Once,   STAT    Comments: Baseline for enoxaparin therapy IF NOT ALREADY DRAWN.  Notify MD if PLT < 100 K.    12/17/18 0456   12/17/18 0452  Culture, sputum-assessment  Once,   R     12/17/18 0456   12/17/18 0452  Gram stain  Once,   STAT     12/17/18 0456   12/17/18 0452  Strep pneumoniae urinary antigen  Once,   STAT     12/17/18 0456   12/17/18 0452  Legionella Pneumophila Serogp 1 Ur Ag  Once,   STAT     12/17/18 0456   12/17/18 0008  Blood culture (routine x 2)  BLOOD CULTURE X 2,   STAT     12/17/18 0007          Vitals/Pain Today's Vitals   12/17/18 1100 12/17/18 1130 12/17/18 1145 12/17/18 1200  BP: 117/72 123/70 132/78 117/68  Pulse: 94 92  94  Resp: 20 (!) 23 (!) 23 (!) 21  Temp:      TempSrc:      SpO2: 91% 92%  93%  Weight:      Height:      PainSc:        Isolation Precautions Droplet and Contact precautions  Medications Medications  aspirin EC tablet 81 mg (81 mg Oral Given 12/17/18 1013)  amiodarone (PACERONE) tablet 200 mg (200 mg Oral Given 12/17/18 1135)  atorvastatin (LIPITOR) tablet 80 mg (has no administration in time range)  carvedilol (COREG) tablet 3.125 mg (3.125 mg Oral Given 12/17/18 1013)  losartan (COZAAR) tablet 12.5 mg (12.5 mg Oral Given 12/17/18 1015)  spironolactone (ALDACTONE) tablet 12.5 mg (12.5 mg Oral Given 12/17/18 1014)  pantoprazole (PROTONIX) EC tablet 40 mg (40 mg Oral Given 12/17/18 1013)  tamsulosin (FLOMAX) capsule 0.4 mg (has no administration in time range)  clonazePAM (KLONOPIN) tablet 1 mg (has no administration in time range)  lamoTRIgine (LAMICTAL) tablet 75 mg (has no administration in time range)  potassium chloride SA (K-DUR) CR tablet 40 mEq (40 mEq Oral  Given 12/17/18 1012)  acetaminophen (TYLENOL) tablet 650 mg (has no administration in time range)    Or  acetaminophen (TYLENOL) suppository 650 mg (has no administration in time range)  ondansetron (ZOFRAN) tablet 4 mg (has no administration  in time range)    Or  ondansetron (ZOFRAN) injection 4 mg (has no administration in time range)  cefTRIAXone (ROCEPHIN) 1 g in sodium chloride 0.9 % 100 mL IVPB (has no administration in time range)  azithromycin (ZITHROMAX) 500 mg in sodium chloride 0.9 % 250 mL IVPB (has no administration in time range)  insulin aspart (novoLOG) injection 0-9 Units (3 Units Subcutaneous Given 12/17/18 1214)  enoxaparin (LOVENOX) injection 40 mg (has no administration in time range)  albuterol (PROVENTIL) (2.5 MG/3ML) 0.083% nebulizer solution 2.5 mg (has no administration in time range)  budesonide (PULMICORT) nebulizer solution 0.25 mg (0.25 mg Nebulization Not Given 12/17/18 1025)  methylPREDNISolone sodium succinate (SOLU-MEDROL) 40 mg/mL injection 40 mg (40 mg Intravenous Given 12/17/18 0550)  furosemide (LASIX) injection 40 mg (40 mg Intravenous Given 12/17/18 1012)  albuterol (VENTOLIN HFA) 108 (90 Base) MCG/ACT inhaler 2 puff (has no administration in time range)  albuterol (VENTOLIN HFA) 108 (90 Base) MCG/ACT inhaler 8 puff (8 puffs Inhalation Given 12/16/18 2345)  albuterol (VENTOLIN HFA) 108 (90 Base) MCG/ACT inhaler 8 puff (8 puffs Inhalation Given 12/17/18 0054)  vancomycin (VANCOCIN) IVPB 1000 mg/200 mL premix (0 mg Intravenous Stopped 12/17/18 0235)  ceFEPIme (MAXIPIME) 2 g in sodium chloride 0.9 % 100 mL IVPB (0 g Intravenous Stopped 12/17/18 0302)  iohexol (OMNIPAQUE) 350 MG/ML injection 100 mL (75 mLs Intravenous Contrast Given 12/17/18 9604)  AeroChamber Plus Flo-Vu Large MISC (  Given 12/17/18 0800)    Mobility walks Low fall risk   Focused Assessments Pulmonary Assessment Handoff:  Lung sounds: Bilateral Breath Sounds: Diminished L Breath Sounds: Expiratory wheezes, Fine crackles R Breath Sounds: Expiratory wheezes, Fine crackles O2 Device: Nasal Cannula O2 Flow Rate (L/min): 2 L/min      R Recommendations: See Admitting Provider Note  Report given to:   Additional Notes:

## 2018-12-17 NOTE — ED Notes (Signed)
ED TO INPATIENT HANDOFF REPORT  ED Nurse Name and Phone #:  Lucious GrovesRobert RN [phone]  S Name/Age/Gender Justin PoseyBruce D Diekmann Jr. 78 y.o. male Room/Bed: RESUSC/RESUSC  Code Status   Code Status: Prior  Home/SNF/Other Home {Patient oriented x4 Is this baseline? Yes  Triage Complete: Triage complete  Chief Complaint chest pain  Triage Note Patient reports worsening exertional dyspnea for several days with wheezing , history of COPD/CAD , he received Magnesium 2 grams IV/Solumedrol 125 mg IV and Epinepihrine 0.3 mg IM by EMS prior to arrival. Denies fever or chills .  Marland Kitchen.    Allergies No Known Allergies  Level of Care/Admitting Diagnosis ED Disposition    ED Disposition Condition Comment   Admit  [facility]ospital [facility] [[id number]]  Level of Care: Progressive [102]  Covid Evaluation: N/A  Diagnosis: Acute respiratory failure with hypoxia [facility](HCC) [[id number]]) [[id number]]  Admitting Physician: Eduard ClosKAKRAKANDY, ARSHAD N [phone][[id number]]  Attending Physician: Eduard ClosKAKRAKANDY, ARSHAD N [phone][[id number]]  Estimated length of stay: past midnight tomorrow  Certification:: I certify this patient will need inpatient services for at least 2 midnights  PT Class (Do Not Modify): Inpatient [101]  PT Acc Code (Do Not Modify): Private [1]       B Medical/Surgery History Past Medical History:  Diagnosis Date  . Acute systolic heart failure (HCC)   . COPD (chronic obstructive pulmonary disease) (HCC)   . Heart attack (HCC)   . Hypertension   . Peripheral neuropathy    Past Surgical History:  Procedure Laterality Date  . CORONARY STENT INTERVENTION N/A 10/16/2018   Procedure: CORONARY STENT INTERVENTION;  Surgeon: Lennette BihariKelly, Thomas A, MD;  [facility];  Service: Cardiovascular;  Laterality: N/A;  . KNEE SURGERY  1994 & 1998  . LEFT HEART CATH AND CORONARY ANGIOGRAPHY N/A 10/16/2018   Procedure: LEFT HEART CATH AND CORONARY ANGIOGRAPHY;  Surgeon: Lennette BihariKelly, Thomas A, MD;  Location: [facility] INVAS[facility] CV LAB;  Service:  Cardiovascular;  Laterality: N/A;  . TONSILLECTOMY       A IV Location/Drains/Wounds Patient Lines/Drains/Airways Status   Active Line/Drains/Airways    Name:   Placement date:   Placement time:   Site:   Days:   Peripheral IV 12/16/18 Left Antecubital   12/16/18    -    Antecubital   1          Intake/Output Last 24 hours No intake or output data in the 24 hours ending 12/17/18 0151  Labs/Imaging Results for orders placed or performed during the hospital encounter of 12/16/18 (from the past 48 hour(s))  SARS Coronavirus 2     Status: None   Collection Time: 12/16/18 11:00 PM  Result Value Ref Range   SARS Coronavirus 2 NOT DETECTED NOT DETECTED    Comment: (NOTE) SARS-CoV-2 target nucleic acids are NOT DETECTED. The SARS-CoV-2 RNA is generally detectable in upper and lower respiratory specimens during the acute phase of infection.  Negative  results do not preclude SARS-CoV-2 infection, do not rule out co-infections with other pathogens, and should not be used as the sole basis for treatment or other patient management decisions.  Negative results must be combined with clinical observations, patient history, and epidemiological information. The expected result is Not Detected. Fact Sheet for Patients: http://www.biofiredefense.com/wp-content/uploads/2020/03/BIOFIRE-COVID -19-patients.pdf Fact Sheet for Healthcare Providers: http://www.biofiredefense.com/wp-content/uploads/2020/03/BIOFIRE-COVID -19-hcp.pdf This test is not yet approved or cleared by the [place] FDA and  has been authorized for detection and/or diagnosis of SARS-CoV-2 by FDA under an Emergency Use Authorization (EUA).  This EUA will remain in effec t (meaning this test can be used) for the duration of  the COVID-19 declaration under Section 564(b)(1) of the Act, 21 U.S.C. section 360bbb-3(b)(1), unless the authorization is terminated or revoked sooner. Performed at [facility], [place]  [address] [place], [place] [address]   Troponin I - Once     Status: Abnormal   Collection Time: 12/16/18 11:20 PM  Result Value Ref Range   Troponin I 0.06 (HH) <0.03 ng/mL    Comment: CRITICAL RESULT CALLED TO, READ BACK BY AND VERIFIED WITH: [name],R RN 12/17/2018 0038 JORDANS Performed at [facility], [place] [address], [place], [place] [address]   CBC with Differential     Status: Abnormal   Collection Time: 12/16/18 11:20 PM  Result Value Ref Range   WBC 18.0 (H) 4.0 - 10.5 K/uL   RBC 4.18 (L) 4.22 - 5.81 MIL/uL   Hemoglobin 12.5 (L) 13.0 - 17.0 g/dL   HCT 38.9 (L) 39.0 - 52.0 %   MCV 93.1 80.0 - 100.0 fL   MCH 29.9 26.0 - 34.0 pg   MCHC 32.1 30.0 - 36.0 g/dL   RDW 14.6 11.5 - 15.5 %   Platelets 382 150 - 400 K/uL   nRBC 0.0 0.0 - 0.2 %   Neutrophils Relative % 77 %   Neutro Abs 13.8 (H) 1.7 - 7.7 K/uL   Lymphocytes Relative 11 %   Lymphs Abs 2.0 0.7 - 4.0 K/uL   Monocytes Relative 10 %   Monocytes Absolute 1.8 (H) 0.1 - 1.0 K/uL   Eosinophils Relative 1 %   Eosinophils Absolute 0.2 0.0 - 0.5 K/uL   Basophils Relative 1 %   Basophils Absolute 0.1 0.0 - 0.1 K/uL   Immature Granulocytes 0 %   Abs Immature Granulocytes 0.08 (H) 0.00 - 0.07 K/uL    Comment: Performed at [facility], [address], [place], [place] [address]  Comprehensive metabolic panel     Status: Abnormal   Collection Time: 12/16/18 11:20 PM  Result Value Ref Range   Sodium 134 (L) 135 - 145 mmol/L   Potassium 4.3 3.5 - 5.1 mmol/L   Chloride 104 98 - 111 mmol/L   CO2 16 (L) 22 - 32 mmol/L   Glucose, Bld 160 (H) 70 - 99 mg/dL   BUN 10 8 - 23 mg/dL   Creatinine, Ser 0.97 0.61 - 1.24 mg/dL   Calcium 8.6 (L) 8.9 - 10.3 mg/dL   Total Protein 6.9 6.5 - 8.1 g/dL   Albumin 3.3 (L) 3.5 - 5.0 g/dL   AST 19 15 - 41 U/L   ALT 14 0 - 44 U/L   Alkaline Phosphatase 88 38 - 126 U/L   Total Bilirubin 1.4 (H) 0.3 - 1.2 mg/dL   GFR calc non Af Amer >60 >60 mL/min   GFR calc Af Amer >60 >60 mL/min    Anion gap 14 5 - 15    Comment: Performed at [facility], [place] [address], [place], [place] [address]  Brain natriuretic peptide     Status: Abnormal   Collection Time: 12/16/18 11:21 PM  Result Value Ref Range   B Natriuretic Peptide 916.8 (H) 0.0 - 100.0 pg/mL    Comment: Performed at [place] [address], [place], [place] [address]  Lactic acid, plasma     Status: None   Collection Time: 12/17/18 12:24 AM  Result Value Ref Range  Lactic Acid, Venous 1.4 0.5 - 1.9 mmol/L    Comment: Performed at [facility], [address] [place], [place] [address]  I-STAT 7, (LYTES, BLD GAS, ICA, H+H)     Status: Abnormal   Collection Time: 12/17/18  1:06 AM  Result Value Ref Range   pH, Arterial 7.402 7.350 - 7.450   pCO2 arterial 31.2 (L) 32.0 - 48.0 mmHg   pO2, Arterial 72.0 (L) 83.0 - 108.0 mmHg   Bicarbonate 19.5 (L) 20.0 - 28.0 mmol/L   TCO2 20 (L) 22 - 32 mmol/L   O2 Saturation 95.0 %   Acid-base deficit 4.0 (H) 0.0 - 2.0 mmol/L   Sodium 134 (L) 135 - 145 mmol/L   Potassium 4.2 3.5 - 5.1 mmol/L   Calcium, Ion 1.21 1.15 - 1.40 mmol/L   HCT 38.0 (L) 39.0 - 52.0 %   Hemoglobin 12.9 (L) 13.0 - 17.0 g/dL   Patient temperature 12.1 F    Collection site RADIAL, ALLEN'S TEST ACCEPTABLE    Drawn by RT    Sample type ARTERIAL    Dg Chest Portable 1 View  Result Date: 12/16/2018 CLINICAL DATA:  Shortness of breath EXAM: PORTABLE CHEST 1 VIEW COMPARISON:  10/23/2018 FINDINGS: Heart is borderline in size. Worsening airspace disease in the lower lobes, right greater than left concerning for pneumonia. Improved aeration in the right upper lobe since prior study. Possible small right effusion. No acute bony abnormality. IMPRESSION: Bilateral lower lobe airspace opacities, most confluent in the right lower lobe concerning for pneumonia. Electronically Signed   By: Charlett Nose M.D.   On: 12/16/2018 23:33    Pending Labs Unresulted Labs (From admission, onward)    Start      Ordered   12/17/18 0043  Blood gas, arterial  ONCE - STAT,   STAT    Question:  Room air or oxygen  Answer:  Oxygen   12/17/18 0042   12/17/18 0008  Blood culture (routine x 2)  BLOOD CULTURE X 2,   STAT     12/17/18 0007          Vitals/Pain Today's Vitals   12/17/18 0100 12/17/18 0115 12/17/18 0130 12/17/18 0132  BP: 130/71 119/64 125/70   Pulse: (!) 103 (!) 103 (!) 104   Resp: 18 (!) 27 18   Temp:      TempSrc:      SpO2: 94% 91% 93%   Weight:      Height:      PainSc:    Asleep    Isolation Precautions No active isolations  Medications Medications  vancomycin (VANCOCIN) IVPB 1000 mg/200 mL premix (1,000 mg Intravenous New Bag/Given 12/17/18 0150)  ceFEPIme (MAXIPIME) 2 g in sodium chloride 0.9 % 100 mL IVPB (has no administration in time range)  albuterol (VENTOLIN HFA) 108 (90 Base) MCG/ACT inhaler 8 puff (8 puffs Inhalation Given 12/16/18 2345)  albuterol (VENTOLIN HFA) 108 (90 Base) MCG/ACT inhaler 8 puff (8 puffs Inhalation Given 12/17/18 0054)    Mobility walks Low fall risk   Focused Assessments Breathing easier / Respirations improved , O2 sat= 94% 4 lpmnc   R Recommendations: See Admitting Provider Note  Report given to:   Additional Notes:

## 2018-12-17 NOTE — ED Notes (Signed)
Pt restful with continued tachypnea, but sleeping.  Report called.

## 2018-12-17 NOTE — ED Notes (Signed)
Unsure at this point whether pt is Covid r/o and nurse to send message to admitting MD to be certain on bed placement.  Second test pending as send out.

## 2018-12-17 NOTE — ED Notes (Signed)
Patient transported to CT scan . 

## 2018-12-17 NOTE — ED Notes (Signed)
Attempt to call report.  Currently  Charge RN on phone with bed placement regarding accepting this patient to their unit.  Number given to call back.

## 2018-12-17 NOTE — H&P (Signed)
History and Physical    Justin PoseyBruce D Kauer Jr. [id number] DOB: 04/13/1941 DOA: 12/16/2018  PCP: Forrest Moronuehle, Stephen, MD  Patient coming from: Home.  Chief Complaint: Shortness of breath.  HPI: Justin PoseyBruce D Ludvigsen Jr. is a 78 y.o. male with history of recent non-ST elevation MI in April 2020 status post stent placement with systolic heart failure last EF measured last week was 35%, COPD, diabetes mellitus, hypertension presents to the ER because of worsening shortness of breath on exertion over the last 1 week.  Denies any associated chest pain productive cough fever chills.  Because of the worsening symptoms patient presented to the ER.  ED Course: In the ER patient was afebrile hypoxic requiring 4 L oxygen.  Chest x-ray shows features concerning for pneumonia.  As per the ER physician on arrival patient was wheezing was placed on nebulizer treatment and started on empiric antibiotics.  On my exam wheezing is improved but still short of breath.  EKG shows normal sinus rhythm with RBBB.  Troponin mildly elevated BNP at around 900.  On exam patient does not have any peripheral edema.  JVD mildly elevated.  Patient admitted for further management of acute respiratory failure with hypoxia.  Labs revealed leukocytosis anemia.  Review of Systems: As per HPI, rest all negative.   Past Medical History:  Diagnosis Date   Acute systolic heart failure (HCC)    COPD (chronic obstructive pulmonary disease) (HCC)    Heart attack (HCC)    Hypertension    Peripheral neuropathy     Past Surgical History:  Procedure Laterality Date   CORONARY STENT INTERVENTION N/A 10/16/2018   Procedure: CORONARY STENT INTERVENTION;  Surgeon: Lennette BihariKelly, Thomas A, MD;  Location: [facility] INVASIVE CV LAB;  Service: Cardiovascular;  Laterality: N/A;   KNEE SURGERY  1994 & 1998   LEFT HEART CATH AND CORONARY ANGIOGRAPHY N/A 10/16/2018   Procedure: LEFT HEART CATH AND CORONARY ANGIOGRAPHY;  Surgeon: Lennette BihariKelly, Thomas A, MD;  Location:  [facility] INVASIVE CV LAB;  Service: Cardiovascular;  Laterality: N/A;   TONSILLECTOMY       reports that he has quit smoking. His smoking use included cigarettes. He has never used smokeless tobacco. He reports previous alcohol use. He reports that he does not use drugs.  No Known Allergies  Family History  Problem Relation Age of Onset   Colon cancer Mother    Kidney cancer Mother    Heart failure Mother    Kidney cancer Father    Thyroid disease Sister     Prior to Admission medications   Medication Sig Start Date End Date Taking? Authorizing Provider  acetaminophen (TYLENOL) 325 MG tablet Take 2 tablets (650 mg total) by mouth every 4 (four) hours as needed for headache or mild pain. 11/06/18  Yes Angiulli, Mcarthur Rossettianiel J, PA-C  amiodarone (PACERONE) 200 MG tablet Take 1 tablet (200 mg total) by mouth daily. 11/06/18  Yes Angiulli, Mcarthur Rossettianiel J, PA-C  aspirin EC 81 MG tablet Take 81 mg by mouth daily.   Yes [provider]  atorvastatin (LIPITOR) 80 MG tablet Take 1 tablet (80 mg total) by mouth daily at 6 PM. 11/06/18  Yes Angiulli, Mcarthur Rossettianiel J, PA-C  carvedilol (COREG) 3.125 MG tablet Take 1 tablet (3.125 mg total) by mouth 2 (two) times daily with a meal for 30 days. 11/06/18 12/17/27 Yes Angiulli, Mcarthur Rossettianiel J, PA-C  clonazePAM (KLONOPIN) 1 MG tablet Take 1 tablet (1 mg total) by mouth at bedtime. 11/06/18  Yes Angiulli, Mcarthur Rossettianiel J, PA-C  FLUoxetine (PROZAC) 20 MG capsule Take 3 capsules (60 mg total) by mouth daily for 30 days. 11/06/18 12/17/27 Yes Angiulli, Mcarthur Rossetti, PA-C  furosemide (LASIX) 80 MG tablet Take 1 tablet (80 mg total) by mouth daily for 30 days. 11/06/18 12/17/27 Yes Angiulli, Mcarthur Rossetti, PA-C  lamoTRIgine (LAMICTAL) 25 MG tablet Take 3 tablets (75 mg total) by mouth at bedtime. 11/06/18  Yes Angiulli, Mcarthur Rossetti, PA-C  losartan (COZAAR) 25 MG tablet Take 0.5 tablets (12.5 mg total) by mouth daily for 30 days. 11/06/18 12/17/27 Yes Angiulli, Mcarthur Rossetti, PA-C  metFORMIN (GLUCOPHAGE) 500 MG tablet Take  0.5 tablets (250 mg total) by mouth every morning. Patient taking differently: Take 250 mg by mouth daily with breakfast.  11/06/18  Yes Angiulli, Mcarthur Rossetti, PA-C  pantoprazole (PROTONIX) 40 MG tablet Take 1 tablet (40 mg total) by mouth daily. 11/07/18  Yes Angiulli, Mcarthur Rossetti, PA-C  potassium chloride SA (K-DUR) 20 MEQ tablet Take 2 tablets (40 mEq total) by mouth daily for 30 days. 11/06/18 12/17/27 Yes Angiulli, Mcarthur Rossetti, PA-C  spironolactone (ALDACTONE) 25 MG tablet Take 0.5 tablets (12.5 mg total) by mouth daily for 30 days. 11/06/18 12/17/27 Yes Angiulli, Mcarthur Rossetti, PA-C  tamsulosin (FLOMAX) 0.4 MG CAPS capsule Take 1 capsule (0.4 mg total) by mouth at bedtime. 11/06/18  Yes Charlton Amor, PA-C    Physical Exam: Vitals:   12/17/18 0300 12/17/18 0315 12/17/18 0330 12/17/18 0345  BP: 122/62 122/61 120/64 119/61  Pulse: 98 96 96 94  Resp: (!) 21 18 20 19   Temp:      TempSrc:      SpO2: 94% 94% 94% 94%  Weight:      Height:          Constitutional: Moderately built and nourished. Vitals:   12/17/18 0300 12/17/18 0315 12/17/18 0330 12/17/18 0345  BP: 122/62 122/61 120/64 119/61  Pulse: 98 96 96 94  Resp: (!) 21 18 20 19   Temp:      TempSrc:      SpO2: 94% 94% 94% 94%  Weight:      Height:       Eyes: Anicteric no pallor. ENMT: No discharge from the ears eyes nose or mouth. Neck: JVD mild elevated no mass felt. Respiratory: No rhonchi or crepitations. Cardiovascular: S1-S2 heard. Abdomen: Soft nontender bowel sounds present. Musculoskeletal: No edema. Skin: No rash. Neurologic: Alert awake oriented to time place and person.  Moves all extremities. Psychiatric: Appears normal per normal affect.   Labs on Admission: I have personally reviewed following labs and imaging studies  CBC: Recent Labs  Lab 12/16/18 2320 12/17/18 0106  WBC 18.0*  --   NEUTROABS 13.8*  --   HGB 12.5* 12.9*  HCT 38.9* 38.0*  MCV 93.1  --   PLT 382  --    Basic Metabolic Panel: Recent Labs    Lab 12/16/18 2320 12/17/18 0106  NA 134* 134*  K 4.3 4.2  CL 104  --   CO2 16*  --   GLUCOSE 160*  --   BUN 10  --   CREATININE 0.97  --   CALCIUM 8.6*  --    GFR: Estimated Creatinine Clearance: 84.1 mL/min (by C-G formula based on SCr of 0.97 mg/dL). Liver Function Tests: Recent Labs  Lab 12/16/18 2320  AST 19  ALT 14  ALKPHOS 88  BILITOT 1.4*  PROT 6.9  ALBUMIN 3.3*   No results for input(s): LIPASE, AMYLASE in the last 168 hours.  No results for input(s): AMMONIA in the last 168 hours. Coagulation Profile: No results for input(s): INR, PROTIME in the last 168 hours. Cardiac Enzymes: Recent Labs  Lab 12/16/18 2320  TROPONINI 0.06*   BNP (last 3 results) No results for input(s): PROBNP in the last 8760 hours. HbA1C: No results for input(s): HGBA1C in the last 72 hours. CBG: No results for input(s): GLUCAP in the last 168 hours. Lipid Profile: No results for input(s): CHOL, HDL, LDLCALC, TRIG, CHOLHDL, LDLDIRECT in the last 72 hours. Thyroid Function Tests: No results for input(s): TSH, T4TOTAL, FREET4, T3FREE, THYROIDAB in the last 72 hours. Anemia Panel: No results for input(s): VITAMINB12, FOLATE, FERRITIN, TIBC, IRON, RETICCTPCT in the last 72 hours. Urine analysis:    Component Value Date/Time   COLORURINE YELLOW 10/15/2018 2053   APPEARANCEUR CLEAR 10/15/2018 2053   LABSPEC >1.046 (H) 10/15/2018 2053   PHURINE 6.0 10/15/2018 2053   GLUCOSEU NEGATIVE 10/15/2018 2053   HGBUR NEGATIVE 10/15/2018 2053   BILIRUBINUR NEGATIVE 10/15/2018 2053   KETONESUR 5 (A) 10/15/2018 2053   PROTEINUR NEGATIVE 10/15/2018 2053   NITRITE NEGATIVE 10/15/2018 2053   LEUKOCYTESUR NEGATIVE 10/15/2018 2053   Sepsis Labs: @LABRCNTIP (procalcitonin:4,lacticidven:4) ) Recent Results (from the past 240 hour(s))  SARS Coronavirus 2     Status: None   Collection Time: 12/16/18 11:00 PM  Result Value Ref Range Status   SARS Coronavirus 2 NOT DETECTED NOT DETECTED Final     Comment: (NOTE) SARS-CoV-2 target nucleic acids are NOT DETECTED. The SARS-CoV-2 RNA is generally detectable in upper and lower respiratory specimens during the acute phase of infection.  Negative  results do not preclude SARS-CoV-2 infection, do not rule out co-infections with other pathogens, and should not be used as the sole basis for treatment or other patient management decisions.  Negative results must be combined with clinical observations, patient history, and epidemiological information. The expected result is Not Detected. Fact Sheet for Patients: http://www.biofiredefense.com/wp-content/uploads/2020/03/BIOFIRE-COVID -19-patients.pdf Fact Sheet for Healthcare Providers: http://www.biofiredefense.com/wp-content/uploads/2020/03/BIOFIRE-COVID -19-hcp.pdf This test is not yet approved or cleared by the [place] FDA and  has been authorized for detection and/or diagnosis of SARS-CoV-2 by FDA under an Emergency Use Authorization (EUA).  This EUA will remain in effec t (meaning this test can be used) for the duration of  the COVID-19 declaration under Section 564(b)(1) of the Act, 21 U.S.C. section 360bbb-3(b)(1), unless the authorization is terminated or revoked sooner. Performed at [facility], [address] [place], [place] [address]      Radiological Exams on Admission: Dg Chest Portable 1 View  Result Date: 12/16/2018 CLINICAL DATA:  Shortness of breath EXAM: PORTABLE CHEST 1 VIEW COMPARISON:  10/23/2018 FINDINGS: Heart is borderline in size. Worsening airspace disease in the lower lobes, right greater than left concerning for pneumonia. Improved aeration in the right upper lobe since prior study. Possible small right effusion. No acute bony abnormality. IMPRESSION: Bilateral lower lobe airspace opacities, most confluent in the right lower lobe concerning for pneumonia. Electronically Signed   By: Charlett NoseKevin  Dover M.D.   On: 12/16/2018 23:33    EKG: Independently  reviewed.  Normal sinus rhythm RBBB.  Assessment/Plan Principal Problem:   Acute respiratory failure with hypoxia (HCC) Active Problems:   COPD (chronic obstructive pulmonary disease) (HCC)   Diabetes mellitus type 2 in obese (HCC)   Chronic systolic heart failure (HCC)   CAD in native artery   PAF (paroxysmal atrial fibrillation) (HCC)   CAP (community acquired pneumonia)    1.  Acute respiratory failure with hypoxia likely could be multifactorial.  Including pneumonia COPD and likely could also be CHF.  Since on my exam patient is on wheezing at this time I have ordered CT angiogram of the chest to rule out any PE or other causes.  Patient is on Lasix 80 mg daily if symptoms of shortness of breath does not improve may have to dose IV.  On empiric antibiotics for pneumonia follow cultures urine for Legionella strep antigen and albuterol Atrovent and Pulmicort nebulizers for COPD with steroids. 2. CAD status post ending in April 2022 months ago -denies any chest pain troponin mildly elevated.  We will cycle cardiac markers continue antiplatelet agents statins beta-blockers 3. History of paroxysmal atrial fibrillation on amiodarone.  Patient is not on anticoagulation not sure.   4. Diabetes mellitus type 2 we will keep patient on sliding scale coverage.  Since patient on steroids likely CBG myoglobin have to closely follow CBGs. 5. Chronic systolic heart failure on ARB spironolactone Lasix.  See #1. 6. Anemia normocytic normochromic appears to be chronic follow CBC. 7. COPD see #1.   DVT prophylaxis: Lovenox. Code Status: Full code. Family Communication: Discussed with patient. Disposition Plan: Home. Consults called: None. Admission status: Inpatient.   Rise Patience MD [facility] Pager [phone].  If 7PM-7AM, please contact night-coverage [email or web address] Password [facility]  12/17/2018, 4:57 AM

## 2018-12-17 NOTE — Progress Notes (Signed)
Willam Munford. is a 78 y.o. male with history of recent non-ST elevation MI in April 2020 status post stent placement with systolic heart failure last EF measured last week was 35%, COPD, diabetes mellitus, hypertension presents to the ER because of worsening shortness of breath on exertion over the last 1 week, hypoxic, with elevated BNP, and elevated troponins. CT angio of the chest suspicous for patchy bilateral infiltrates concerning for infection vs edema.   His COVID inpatient test was negative.  He was started on Broad spectrum IV antibiotics, and IV lasix for diuresis and IV steroids.  Repeat NOVEL Coronavirus test ordered and patient on droplet precautions.   Please see Dr Moise Boring note of detailed H&P from earlier today.    Hosie Poisson, MD [phone]

## 2018-12-17 NOTE — ED Notes (Signed)
EKG repeated , EDP reviewed result.

## 2018-12-17 NOTE — ED Notes (Signed)
Second call made to Pulmonary floor.   Reportedly patient remains unapproved at this time.

## 2018-12-17 NOTE — ED Notes (Signed)
This RN acting as Art therapist and asked pt if he would like for me to call any family/friends. Pt requested that his significant other, Kieth Brightly be contact. I called Kieth Brightly and let her know that pt is being admitted, and gave her phone number to call and get updates.

## 2018-12-17 NOTE — ED Notes (Signed)
Sister of patient called Babs [phone] and nurse navigator was able to to have patient speak with his sister on the phone while he waits for an admission bed assignment.

## 2018-12-18 ENCOUNTER — Inpatient Hospital Stay (HOSPITAL_COMMUNITY): Payer: Medicare Other

## 2018-12-18 DIAGNOSIS — E1169 Type 2 diabetes mellitus with other specified complication: Secondary | ICD-10-CM

## 2018-12-18 DIAGNOSIS — I480 Paroxysmal atrial fibrillation: Secondary | ICD-10-CM

## 2018-12-18 DIAGNOSIS — J9601 Acute respiratory failure with hypoxia: Secondary | ICD-10-CM

## 2018-12-18 DIAGNOSIS — R0902 Hypoxemia: Secondary | ICD-10-CM

## 2018-12-18 DIAGNOSIS — J441 Chronic obstructive pulmonary disease with (acute) exacerbation: Secondary | ICD-10-CM

## 2018-12-18 DIAGNOSIS — E669 Obesity, unspecified: Secondary | ICD-10-CM

## 2018-12-18 DIAGNOSIS — I5043 Acute on chronic combined systolic (congestive) and diastolic (congestive) heart failure: Secondary | ICD-10-CM

## 2018-12-18 LAB — CBC
HCT: 37 % — ABNORMAL LOW (ref 39.0–52.0)
Hemoglobin: 11.8 g/dL — ABNORMAL LOW (ref 13.0–17.0)
MCH: 29.7 pg (ref 26.0–34.0)
MCHC: 31.9 g/dL (ref 30.0–36.0)
MCV: 93.2 fL (ref 80.0–100.0)
Platelets: 396 10*3/uL (ref 150–400)
RBC: 3.97 MIL/uL — ABNORMAL LOW (ref 4.22–5.81)
RDW: 14.7 % (ref 11.5–15.5)
WBC: 26.6 10*3/uL — ABNORMAL HIGH (ref 4.0–10.5)
nRBC: 0 % (ref 0.0–0.2)

## 2018-12-18 LAB — GLUCOSE, CAPILLARY
Glucose-Capillary: 156 mg/dL — ABNORMAL HIGH (ref 70–99)
Glucose-Capillary: 173 mg/dL — ABNORMAL HIGH (ref 70–99)
Glucose-Capillary: 163 mg/dL — ABNORMAL HIGH (ref 70–99)
Glucose-Capillary: 188 mg/dL — ABNORMAL HIGH (ref 70–99)
Glucose-Capillary: 163 mg/dL — ABNORMAL HIGH (ref 70–99)

## 2018-12-18 LAB — BASIC METABOLIC PANEL
Anion gap: 10 (ref 5–15)
BUN: 22 mg/dL (ref 8–23)
CO2: 23 mmol/L (ref 22–32)
Calcium: 8.7 mg/dL — ABNORMAL LOW (ref 8.9–10.3)
Chloride: 105 mmol/L (ref 98–111)
Creatinine, Ser: 1.14 mg/dL (ref 0.61–1.24)
GFR calc Af Amer: >60 mL/min (ref >60)
GFR calc non Af Amer: >60 mL/min (ref >60)
Glucose, Bld: 188 mg/dL — ABNORMAL HIGH (ref 70–99)
Potassium: 4 mmol/L (ref 3.5–5.1)
Sodium: 138 mmol/L (ref 135–145)

## 2018-12-18 LAB — NOVEL CORONAVIRUS, NAA (HOSP ORDER, SEND-OUT TO REF LAB; TAT 18-24 HRS): SARS-CoV-2, NAA: NOT DETECTED

## 2018-12-18 MED ORDER — TICAGRELOR 90 MG PO TABS
90.0000 mg | ORAL_TABLET | Freq: Two times a day (BID) | ORAL | Status: DC
  Administered 2018-12-19 – 2018-12-25 (×13): 90 mg via ORAL
  Filled 2018-12-18 (×13): qty 1

## 2018-12-18 MED ORDER — POTASSIUM CHLORIDE CRYS ER 20 MEQ PO TBCR: 20.0000 meq | EXTENDED_RELEASE_TABLET | Freq: Every day | ORAL | Status: DC

## 2018-12-18 MED ORDER — FLUOXETINE HCL 20 MG PO CAPS
60.0000 mg | ORAL_CAPSULE | Freq: Every day | ORAL | Status: DC
  Administered 2018-12-18 – 2018-12-25 (×8): 60 mg via ORAL
  Filled 2018-12-18 (×8): qty 3

## 2018-12-18 MED ORDER — FUROSEMIDE 10 MG/ML IJ SOLN
80.0000 mg | Freq: Two times a day (BID) | INTRAMUSCULAR | Status: DC
  Administered 2018-12-18 – 2018-12-22 (×8): 80 mg via INTRAVENOUS
  Filled 2018-12-18 (×9): qty 8

## 2018-12-18 MED ORDER — TICAGRELOR 90 MG PO TABS
180.0000 mg | ORAL_TABLET | Freq: Once | ORAL | Status: AC
  Administered 2018-12-18: 180 mg via ORAL
  Filled 2018-12-18: qty 2

## 2018-12-18 MED ORDER — TICAGRELOR 90 MG PO TABS: 90.0000 mg | ORAL_TABLET | Freq: Two times a day (BID) | ORAL | 3 refills | Status: DC

## 2018-12-18 NOTE — Progress Notes (Signed)
PROGRESS NOTE    Justin Bradford.  NTI:[id number] DOB: [birth year] DOA: 12/16/2018 PCP: Charleston Poot, MD    Brief Narrative:  Justin Bradfordis a 79 y.o.malewithhistory of recent non-ST elevation MI in April 2020 status post stent placement with systolic heart failure last EF measured last week was 35%, COPD, diabetes mellitus, hypertension presents to the ER because of worsening shortness of breath on exertion over the last 1 week, hypoxic, with elevated BNP, and elevated troponins. CT angio of the chest suspicous for patchy bilateral infiltrates concerning for infection vs edema.   His COVID inpatient test was negative.  He was started on Broad spectrum IV antibiotics, and IV lasix for diuresis and IV steroids.  Repeat NOVEL Coronavirus test ordered and patient on droplet precautions.    Assessment & Plan:   Principal Problem:   Acute respiratory failure with hypoxia (HCC) Active Problems:   COPD (chronic obstructive pulmonary disease) (HCC)   Diabetes mellitus type 2 in obese (HCC)   Chronic systolic heart failure (HCC)   CAD in native artery   PAF (paroxysmal atrial fibrillation) ([place])   CAP (community acquired pneumonia)   Acute respiratory failure with hypoxia Differential include acute systolic heart failure and community acquired pneumonia and COPD exacerbation.  Admitted to progressive unit.  Though covid 19 test is negative, in view of the bilateral patchy infiltrates a repeat send out test sent.  Meanwhile started the patient on IV rocephin and zithromax, IV steroids and bronchodilators.  IV lasix 40 mg BID, strict intake and output.  Not much diuresis overnight. Repeat CXR today  Cardiology consulted for Acute CHF.    CAD: Pt on aspirin , statin, coreg, and losartan. Continue the same.  Pt off [place] for unclear reasons.    Type 2 DM: CBG (last 3)  Recent Labs    12/18/18 0658 12/18/18 0736 12/18/18 1149  GLUCAP 156* 173* 163*   Resume  SSI while inpatient.    Anxiety and depression: Resume home meds.    Paroxysmal atrial fibrillation  On amiodarone.         DVT prophylaxis: lovenox.   Code Status: full code.  Family Communication: none at bedside.  Disposition Plan: pending clinical improvement.    Consultants:   Cardiology.   Procedures:  CT angiogram  of the chest.   Antimicrobials: Rocephin and zithromax.   Subjective: Still sob, requesting to restart his prozac.   Objective: Vitals:   12/18/18 0737 12/18/18 1125 12/18/18 1150 12/18/18 1525  BP: 100/61  108/76   Pulse: 87  84   Resp: (!) 23  (!) 25   Temp: 98 F (36.7 C)  98 F (36.7 C)   TempSrc: Axillary  Axillary   SpO2: 98% 94% 92% (!) 89%  Weight:      Height:        Intake/Output Summary (Last 24 hours) at 12/18/2018 1700 Last data filed at 12/18/2018 1101 Gross per 24 hour  Intake 903.36 ml  Output 1750 ml  Net -846.64 ml   Filed Weights   12/16/18 2307 12/18/18 0607  Weight: 120 kg 129.4 kg    Examination:  General exam: mil distress from sob, and is on 10 lit of [place] oxygen Respiratory system: bilateral rales and  Wheezing posteriorly.  Cardiovascular system: S1 & S2 heard, RRR.  Gastrointestinal system: Abdomen is nondistended, soft and nontender. No organomegaly or masses felt. Normal bowel sounds heard. Central nervous system: Alert and oriented.non focal. Extremities: Symmetric 5 x 5  power. Skin: No rashes, lesions or ulcers Psychiatry:  Mood & affect appropriate.     Data Reviewed: I have personally reviewed following labs and imaging studies  CBC: Recent Labs  Lab 12/16/18 2320 12/17/18 0106 12/17/18 0507 12/18/18 1026  WBC 18.0*  --  15.4* 26.6*  NEUTROABS 13.8*  --  14.4*  --   HGB 12.5* 12.9* 12.2* 11.8*  HCT 38.9* 38.0* 38.5* 37.0*  MCV 93.1  --  94.1 93.2  PLT 382  --  344 396   Basic Metabolic Panel: Recent Labs  Lab 12/16/18 2320 12/17/18 0106 12/17/18 0507 12/18/18 1026  NA 134*  134* 133* 138  K 4.3 4.2 3.9 4.0  CL 104  --  103 105  CO2 16*  --  19* 23  GLUCOSE 160*  --  267* 188*  BUN 10  --  11 22  CREATININE 0.97  --  1.15 1.14  CALCIUM 8.6*  --  8.5* 8.7*  MG  --   --  2.2  --    GFR: Estimated Creatinine Clearance: 76.6 mL/min (by C-G formula based on SCr of 1.14 mg/dL). Liver Function Tests: Recent Labs  Lab 12/16/18 2320 12/17/18 0507  AST 19 19  ALT 14 15  ALKPHOS 88 85  BILITOT 1.4* 1.3*  PROT 6.9 6.5  ALBUMIN 3.3* 3.1*   No results for input(s): LIPASE, AMYLASE in the last 168 hours. No results for input(s): AMMONIA in the last 168 hours. Coagulation Profile: No results for input(s): INR, PROTIME in the last 168 hours. Cardiac Enzymes: Recent Labs  Lab 12/16/18 2320 12/17/18 0507 12/17/18 1056 12/17/18 1714  TROPONINI 0.06* 0.18* 0.18* 0.17*   BNP (last 3 results) No results for input(s): PROBNP in the last 8760 hours. HbA1C: No results for input(s): HGBA1C in the last 72 hours. CBG: Recent Labs  Lab 12/17/18 1614 12/17/18 2120 12/18/18 0658 12/18/18 0736 12/18/18 1149  GLUCAP 136* 173* 156* 173* 163*   Lipid Profile: No results for input(s): CHOL, HDL, LDLCALC, TRIG, CHOLHDL, LDLDIRECT in the last 72 hours. Thyroid Function Tests: Recent Labs    12/17/18 0507  TSH 3.195   Anemia Panel: No results for input(s): VITAMINB12, FOLATE, FERRITIN, TIBC, IRON, RETICCTPCT in the last 72 hours. Sepsis Labs: Recent Labs  Lab 12/17/18 0024  LATICACIDVEN 1.4    Recent Results (from the past 240 hour(s))  SARS Coronavirus 2     Status: None   Collection Time: 12/16/18 11:00 PM  Result Value Ref Range Status   SARS Coronavirus 2 NOT DETECTED NOT DETECTED Final    Comment: (NOTE) SARS-CoV-2 target nucleic acids are NOT DETECTED. The SARS-CoV-2 RNA is generally detectable in upper and lower respiratory specimens during the acute phase of infection.  Negative  results do not preclude SARS-CoV-2 infection, do not rule out  co-infections with other pathogens, and should not be used as the sole basis for treatment or other patient management decisions.  Negative results must be combined with clinical observations, patient history, and epidemiological information. The expected result is Not Detected. Fact Sheet for Patients: http://www.biofiredefense.com/wp-content/uploads/2020/03/BIOFIRE-COVID -19-patients.pdf Fact Sheet for Healthcare Providers: http://www.biofiredefense.com/wp-content/uploads/2020/03/BIOFIRE-COVID -19-hcp.pdf This test is not yet approved or cleared by the [place] FDA and  has been authorized for detection and/or diagnosis of SARS-CoV-2 by FDA under an Emergency Use Authorization (EUA).  This EUA will remain in effec t (meaning this test can be used) for the duration of  the COVID-19 declaration under Section 564(b)(1) of the Act, 21 U.S.C. section  360bbb-3(b)(1), unless the authorization is terminated or revoked sooner. Performed at [facility], [address], [place], [place] [address]   Novel Coronavirus,NAA,(SEND-OUT TO REF LAB - TAT 24-48 hrs); Hosp Order     Status: None   Collection Time: 12/16/18 11:00 PM   Specimen: Nasopharyngeal Swab; Respiratory  Result Value Ref Range Status   SARS-CoV-2, NAA NOT DETECTED NOT DETECTED Final    Comment: (NOTE) This test was developed and its performance characteristics determined by [employer]. This test has not been FDA cleared or approved. This test has been authorized by FDA under an Emergency Use Authorization (EUA). This test is only authorized for the duration of time the declaration that circumstances exist justifying the authorization of the emergency use of in vitro diagnostic tests for detection of SARS-CoV-2 virus and/or diagnosis of COVID-19 infection under section 564(b)(1) of the Act, 21 U.S.C. 716RCV-8(L)(3), unless the authorization is terminated or revoked sooner. When diagnostic testing is  negative, the possibility of a false negative result should be considered in the context of a patient's recent exposures and the presence of clinical signs and symptoms consistent with COVID-19. An individual without symptoms of COVID-19 and who is not shedding SARS-CoV-2 virus would expect to have a negative (not detected) result in this assay. Performed  At: [facility] [address] [place], [place] [id number] Jolene Schimke MD HE:[id number]    Coronavirus Source NASOPHARYNGEAL  Final    Comment: Performed at [facility], [address] [place], [place] [address]  Blood culture (routine x 2)     Status: None (Preliminary result)   Collection Time: 12/17/18 12:25 AM   Specimen: BLOOD LEFT ARM  Result Value Ref Range Status   Specimen Description BLOOD LEFT ARM  Final   Special Requests   Final    BOTTLES DRAWN AEROBIC AND ANAEROBIC Blood Culture adequate volume   Culture   Final    NO GROWTH 1 DAY Performed at [facility], [address] [place], [place] [address]    Report Status PENDING  Incomplete  Blood culture (routine x 2)     Status: None (Preliminary result)   Collection Time: 12/17/18  1:40 AM   Specimen: BLOOD LEFT ARM  Result Value Ref Range Status   Specimen Description BLOOD LEFT ARM  Final   Special Requests   Final    BOTTLES DRAWN AEROBIC AND ANAEROBIC Blood Culture adequate volume   Culture   Final    NO GROWTH 1 DAY Performed at [facility], [address] [place], [place] [address]    Report Status PENDING  Incomplete         Radiology Studies: Ct Angio Chest Pe W Or Wo Contrast  Result Date: 12/17/2018 CLINICAL DATA:  Shortness of breath for 1 week. Symptoms became worse yesterday. EXAM: CT ANGIOGRAPHY CHEST WITH CONTRAST TECHNIQUE: Multidetector CT imaging of the chest was performed using the standard protocol during bolus administration of intravenous contrast. Multiplanar CT image reconstructions and MIPs were  obtained to evaluate the vascular anatomy. CONTRAST:  49mL OMNIPAQUE IOHEXOL 350 MG/ML SOLN COMPARISON:  One-view chest x-ray 12/18/2018. CT of the chest 10/23/2018 FINDINGS: Cardiovascular: The heart size is normal. Coronary artery calcifications are present in the LAD and right circumflex artery. No significant pericardial effusion is present. Atherosclerotic changes are noted in the aorta and great vessel origins. There is no aortic aneurysm. No significant stenosis is evident at the origins of the great vessels.  Pulmonary artery opacification is excellent. No focal filling defects are present to suggest pulmonary embolus. Mediastinum/Nodes: A right paratracheal node near the carina measures 12 mm in short axis. Small prevascular nodes are present. Other subcentimeter paratracheal nodes are present. Asymmetric right hilar adenopathy is noted. Lungs/Pleura: Diffuse patchy bilateral airspace opacities are present. Moderate bilateral pleural effusions are noted. Upper Abdomen: Visualized upper abdomen is unremarkable. Musculoskeletal: Vertebral body heights and alignment are maintained. No focal lytic or blastic lesions are present. Sternum is unremarkable. Ribs are within normal limits bilaterally. Review of the MIP images confirms the above findings. IMPRESSION: 1. No pulmonary embolus. 2. Diffuse patchy airspace disease bilaterally. Findings are most concerning for infection. 3. Bilateral pleural effusions. Airspace disease could represent edema and heart failure. 4.  Aortic Atherosclerosis (ICD10-I70.0). 5. Coronary artery disease. Electronically Signed   By: Marin Robertshristopher  Mattern M.D.   On: 12/17/2018 07:06   Dg Chest Port 1 View  Result Date: 12/18/2018 CLINICAL DATA:  Shortness of breath for 1 week. EXAM: PORTABLE CHEST 1 VIEW COMPARISON:  12/16/2018 and prior radiographs FINDINGS: Increasing bilateral airspace opacities noted since 12/16/2018. The cardiomediastinal silhouette is unchanged. Equivocal small  RIGHT pleural effusion noted. No pneumothorax or acute bony abnormality. IMPRESSION: Increasing bilateral airspace opacities which may represent edema and/or infection. Electronically Signed   By: Harmon PierJeffrey  Hu M.D.   On: 12/18/2018 13:09   Dg Chest Portable 1 View  Result Date: 12/16/2018 CLINICAL DATA:  Shortness of breath EXAM: PORTABLE CHEST 1 VIEW COMPARISON:  10/23/2018 FINDINGS: Heart is borderline in size. Worsening airspace disease in the lower lobes, right greater than left concerning for pneumonia. Improved aeration in the right upper lobe since prior study. Possible small right effusion. No acute bony abnormality. IMPRESSION: Bilateral lower lobe airspace opacities, most confluent in the right lower lobe concerning for pneumonia. Electronically Signed   By: Charlett NoseKevin  Dover M.D.   On: 12/16/2018 23:33        Scheduled Meds: . albuterol  2 puff Inhalation QID  . amiodarone  200 mg Oral Daily  . aspirin EC  81 mg Oral Daily  . atorvastatin  80 mg Oral q1800  . budesonide (PULMICORT) nebulizer solution  0.25 mg Nebulization BID  . carvedilol  3.125 mg Oral BID WC  . clonazePAM  1 mg Oral QHS  . enoxaparin (LOVENOX) injection  40 mg Subcutaneous Q24H  . furosemide  40 mg Intravenous Q12H  . insulin aspart  0-9 Units Subcutaneous TID WC  . lamoTRIgine  75 mg Oral QHS  . losartan  12.5 mg Oral Daily  . mouth rinse  15 mL Mouth Rinse BID  . methylPREDNISolone (SOLU-MEDROL) injection  40 mg Intravenous Q12H  . pantoprazole  40 mg Oral Daily  . potassium chloride SA  40 mEq Oral Daily  . spironolactone  12.5 mg Oral Daily  . tamsulosin  0.4 mg Oral QHS  . ticagrelor  180 mg Oral Once  . [START ON 12/19/2018] ticagrelor  90 mg Oral BID   Continuous Infusions: . sodium chloride 10 mL/hr at 12/17/18 1800  . azithromycin 500 mg (12/17/18 2239)  . cefTRIAXone (ROCEPHIN)  IV Stopped (12/17/18 1739)     LOS: 1 day    Time spent: 38 minutes     Kathlen ModyVijaya Dellar Traber, MD [facility]  Pager [phone]   If 7PM-7AM, please contact night-coverage [email or web address] Password [email or web address] 12/18/2018, 5:00 PM

## 2018-12-18 NOTE — Plan of Care (Signed)
  Problem: Education: Goal: Knowledge of General Education information will improve Description: Including pain rating scale, medication(s)/side effects and non-pharmacologic comfort measures Outcome: Progressing   Problem: Clinical Measurements: Goal: Diagnostic test results will improve Outcome: Progressing Goal: Respiratory complications will improve Outcome: Progressing   Problem: Nutrition: Goal: Adequate nutrition will be maintained Outcome: Progressing   Problem: Coping: Goal: Level of anxiety will decrease Outcome: Progressing   Problem: Pain Managment: Goal: General experience of comfort will improve Outcome: Progressing   

## 2018-12-18 NOTE — Consult Note (Addendum)
Cardiology Consultation:   Patient ID: Justin PoseyBruce D Martindale Jr.; [id number]; 03/12/1941   Admit date: 12/16/2018 Date of Consult: 12/18/2018  Primary Care Provider: Forrest Moronuehle, Stephen, MD Primary Cardiologist: Norman HerrlichBrian Munley, MD Primary Electrophysiologist:  None  Patient Profile:   Justin PoseyBruce D Mcconathy Jr. is a 78 y.o. 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 Blake DivineAkula.  History of Present Illness:   Mr. Justin Bradford was in his usual state of health until 1 week ago when he began experiencing DOE. He reported progression of symptoms to the point that he was experiencing SOB going from the bathroom to his bedroom prompting him to activate EMS.   He was last evaluated by cardiology via a telemedicine visit with Dr. Dulce SellarMunley 11/19/2018, at which time he reported feeling great and was without cardiac complaints.Able to squat up and down without problem   He was recommended for a repeat echocardiogram to evaluate his LV function after NSTEMI 10/2018 (trop peaked at >65) where he was found to have late presentation of an anterior wall MI 2/2 100% occluded LAD on LHC, managed with PCI/DES to LAD. Echo 12/10/2018 revealed EF 35% (30-35% 10/2018), moderate concentric LV hypertrophy, indeterminate LV diastolic function, and moderate dilation of the ascending aorta (40mm).   The pt says over the past 10 days he really started noticing SOB with activity + orthopnea  No PND  No signif edema  He has noticed some weight gain but was surprised to hear he had gained 15lbs since his visit with Dr. Dulce SellarMunley 11/19/2018. He denies dietary indiscretion. Pt has no complaints of chest pain since coronary stent placement 10/2018. He also denies palpitations, dizziness, lightheadedness, or syncope.   Hospital course: tachycardic on arrival (improved), intermittently  tachypneic, hypoxic on RA (improved with O2 via [place]), . Labs notable WBC 18>15>26 (s/p steroids),  BNP 916, Trop 0.06>0.18>0.18>0.17. COVID-19 negativeCXR with bilateral lower lobe airspace opacities, most confluent in RLL c/w PNA. CTA Chest:  NO PE;  patchy airspace disease bilaterally c/f infection. Repeat CXR today with increasing bilateral airspace opacities c/f edema and/or infection.   EKG with sinus rhythm with RBBB, no significant change from previous.   Past Medical History:  Diagnosis Date   Acute systolic heart failure (HCC)    Arthritis    COPD (chronic obstructive pulmonary disease) (HCC)    Heart attack (HCC)    Hypertension    Peripheral neuropathy    Pneumonia     Past Surgical History:  Procedure Laterality Date   CORONARY STENT INTERVENTION N/A 10/16/2018   Procedure: CORONARY STENT INTERVENTION;  Surgeon: Lennette BihariKelly, Thomas A, MD;  Location: [facility];  Service: Cardiovascular;  Laterality: N/A;   KNEE SURGERY  1994 & 1998   LEFT HEART CATH AND CORONARY ANGIOGRAPHY N/A 10/16/2018   Procedure: LEFT HEART CATH AND CORONARY ANGIOGRAPHY;  Surgeon: Lennette BihariKelly, Thomas A, MD;  Location: [facility];  Service: Cardiovascular;  Laterality: N/A;   TONSILLECTOMY       Home Medications:  Prior to Admission medications   Medication Sig Start Date End Date Taking? Authorizing Provider  acetaminophen (TYLENOL) 325 MG tablet Take 2 tablets (650 mg total) by mouth every 4 (four) hours as needed for headache or mild pain. 11/06/18  Yes Angiulli, Mcarthur Rossettianiel J, PA-C  amiodarone (PACERONE) 200  MG tablet Take 1 tablet (200 mg total) by mouth daily. 11/06/18  Yes Angiulli, Mcarthur Rossetti, PA-C  aspirin EC 81 MG tablet Take 81 mg by mouth daily.   Yes [provider]  atorvastatin (LIPITOR) 80 MG tablet Take 1 tablet (80 mg total) by mouth daily at 6 PM. 11/06/18  Yes Angiulli, Mcarthur Rossetti, PA-C  carvedilol (COREG) 3.125 MG tablet Take 1 tablet (3.125 mg total) by mouth 2 (two) times  daily with a meal for 30 days. 11/06/18 12/17/27 Yes Angiulli, Mcarthur Rossetti, PA-C  clonazePAM (KLONOPIN) 1 MG tablet Take 1 tablet (1 mg total) by mouth at bedtime. 11/06/18  Yes Angiulli, Mcarthur Rossetti, PA-C  FLUoxetine (PROZAC) 20 MG capsule Take 3 capsules (60 mg total) by mouth daily for 30 days. 11/06/18 12/17/27 Yes Angiulli, Mcarthur Rossetti, PA-C  furosemide (LASIX) 80 MG tablet Take 1 tablet (80 mg total) by mouth daily for 30 days. 11/06/18 12/17/27 Yes Angiulli, Mcarthur Rossetti, PA-C  lamoTRIgine (LAMICTAL) 25 MG tablet Take 3 tablets (75 mg total) by mouth at bedtime. 11/06/18  Yes Angiulli, Mcarthur Rossetti, PA-C  losartan (COZAAR) 25 MG tablet Take 0.5 tablets (12.5 mg total) by mouth daily for 30 days. 11/06/18 12/17/27 Yes Angiulli, Mcarthur Rossetti, PA-C  metFORMIN (GLUCOPHAGE) 500 MG tablet Take 0.5 tablets (250 mg total) by mouth every morning. Patient taking differently: Take 250 mg by mouth daily with breakfast.  11/06/18  Yes Angiulli, Mcarthur Rossetti, PA-C  pantoprazole (PROTONIX) 40 MG tablet Take 1 tablet (40 mg total) by mouth daily. 11/07/18  Yes Angiulli, Mcarthur Rossetti, PA-C  potassium chloride SA (K-DUR) 20 MEQ tablet Take 2 tablets (40 mEq total) by mouth daily for 30 days. 11/06/18 12/17/27 Yes Angiulli, Mcarthur Rossetti, PA-C  spironolactone (ALDACTONE) 25 MG tablet Take 0.5 tablets (12.5 mg total) by mouth daily for 30 days. 11/06/18 12/17/27 Yes Angiulli, Mcarthur Rossetti, PA-C  tamsulosin (FLOMAX) 0.4 MG CAPS capsule Take 1 capsule (0.4 mg total) by mouth at bedtime. 11/06/18  Yes Angiulli, Mcarthur Rossetti, PA-C    Inpatient Medications: Scheduled Meds:  albuterol  2 puff Inhalation QID   amiodarone  200 mg Oral Daily   aspirin EC  81 mg Oral Daily   atorvastatin  80 mg Oral q1800   budesonide (PULMICORT) nebulizer solution  0.25 mg Nebulization BID   carvedilol  3.125 mg Oral BID WC   clonazePAM  1 mg Oral QHS   enoxaparin (LOVENOX) injection  40 mg Subcutaneous Q24H   furosemide  40 mg Intravenous Q12H   insulin aspart  0-9 Units Subcutaneous TID  WC   lamoTRIgine  75 mg Oral QHS   losartan  12.5 mg Oral Daily   mouth rinse  15 mL Mouth Rinse BID   methylPREDNISolone (SOLU-MEDROL) injection  40 mg Intravenous Q12H   pantoprazole  40 mg Oral Daily   potassium chloride SA  40 mEq Oral Daily   spironolactone  12.5 mg Oral Daily   tamsulosin  0.4 mg Oral QHS   Continuous Infusions:  sodium chloride 10 mL/hr at 12/17/18 1800   azithromycin 500 mg (12/17/18 2239)   cefTRIAXone (ROCEPHIN)  IV Stopped (12/17/18 1739)   PRN Meds: sodium chloride, acetaminophen **OR** acetaminophen, albuterol, ondansetron **OR** ondansetron (ZOFRAN) IV  Allergies:   No Known Allergies  Social History:   Social History   Socioeconomic History   Marital status: Widowed    Spouse name: Not on file   Number of children: Not on file   Years of education:  Not on file   Highest education level: Not on file  Occupational History   Not on file  Social Needs   Financial resource strain: Not hard at all   Food insecurity    Worry: Never true    Inability: Never true   Transportation needs    Medical: No    Non-medical: No  Tobacco Use   Smoking status: Former Smoker    Packs/day: 2.00    Types: Cigarettes    Quit date: 12/16/2005    Years since quitting: 13.0   Smokeless tobacco: Never Used   Tobacco comment: quit in 2007  Substance and Sexual Activity   Alcohol use: Not Currently   Drug use: No   Sexual activity: Not on file  Lifestyle   Physical activity    Days per week: Not on file    Minutes per session: Not on file   Stress: Not at all  Relationships   Social connections    Talks on phone: Not on file    Gets together: Not on file    Attends religious service: Not on file    Active member of club or organization: Not on file    Attends meetings of clubs or organizations: Not on file    Relationship status: Widowed   Intimate partner violence    Fear of current or ex partner: Not on file     Emotionally abused: Not on file    Physically abused: Not on file    Forced sexual activity: Not on file  Other Topics Concern   Not on file  Social History Narrative   Not on file    Family History:    Family History  Problem Relation Age of Onset   Colon cancer Mother    Kidney cancer Mother    Heart failure Mother    Kidney cancer Father    Thyroid disease Sister      ROS:  Please see the history of present illness.   All other ROS reviewed and negative.     Physical Exam/Data:   Vitals:   12/18/18 0716 12/18/18 0737 12/18/18 1125 12/18/18 1150  BP:  100/61  108/76  Pulse:  87  84  Resp:  (!) 23  (!) 25  Temp:  98 F (36.7 C)  98 F (36.7 C)  TempSrc:  Axillary  Axillary  SpO2: 93% 98% 94% 92%  Weight:      Height:        Intake/Output Summary (Last 24 hours) at 12/18/2018 1347 Last data filed at 12/18/2018 1101 Gross per 24 hour  Intake 1153.36 ml  Output 1950 ml  Net -796.64 ml   Filed Weights   12/16/18 2307 12/18/18 0607  Weight: 120 kg 129.4 kg   Body mass index is 38.69 kg/m.  Physical exam  HEENT:  NCAT Neck:  No bruits   JVP is increased LUngs:  Bilateral rhonchi  Rales at bases Cardiac exam:   RRR  Normal S1, S2,  No S3   No significant murmurs  PMI not displace Abd:  Sl distended   Nontender   No hepatomegaly   Normal BS Ext No signif LE edema   Feet warm   2+ PT pulses Skin:   Dry  No rashes Neuro:  A and O x 3  CN II-XII intact   Psych:   Normal affect  EKG:  The EKG was personally reviewed and demonstrates:  EKG with sinus rhythm with RBBB, no significant change  from previous.   Relevant CV Studies: Echocardiogram 12/10/2018: IMPRESSIONS    1. The left ventricle has moderately reduced systolic function, with an ejection fraction of 35%. The cavity size was normal. There is moderate concentric left ventricular hypertrophy. Left ventricular diastolic Doppler parameters are indeterminate.  2. The right ventricle has normal  systolic function. The cavity was normal. There is no increase in right ventricular wall thickness.  3. The mitral valve is degenerative. There is mild to moderate mitral annular calcification present. No evidence of mitral valve stenosis.  4. Mild thickening of the aortic valve. Mild focal calcification of the aortic valve. Aortic valve regurgitation is mild by color flow Doppler.  5. 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  6. Stage 1: 1: Multiple segmental abnormalities exist. See findings.  Left heart catheterization 10/2018  Prox RCA lesion is 20% stenosed.  Ost LAD to Prox LAD lesion is 100% stenosed.  Dist Cx lesion is 60% stenosed.  Post intervention, there is a 55% residual stenosis.  A stent was successfully placed.   Late presentation anterior wall myocardial infarction secondary to total proximal occlusion of a severely calcified proximal LAD with TIMI 0 flow.  Mild to moderate concomitant CAD with 60% distal AV groove circumflex stenosis and calcified dominant RCA with 20% proximal stenosis (the RCA was not optimally selectively engaged but visualization was adequate).  LVEDP 27 mmHg.  Difficult but successful PCI to a totally occluded severely calcified proximal LAD with PTCA, Cutting Balloon, and ultimate stenting with a 3.0 x 38 mm Resolute DES stent postdilated to 3.3 to 3.5 mm.  There was still moderate narrowing in the proximal portion of the stent which could not be optimally  expanded secondary to the significant residual calcification.  Aggrastat was administered due to transient low flow which ultimately normalized.  RECOMMENDATION: DAPT for minimum of 1 year.  Patient will be transported to [facility] for close observation.  He will need diuresis.  With his late presentation MI and revascularization, there is likelihood for significant residual myocardial damage.  Echocardiogram 10/2018: IMPRESSIONS    1. The left ventricle has  moderate-severely reduced systolic function, with an ejection fraction of 30-35%. The cavity size was normal. Left ventricular diastolic function could not be evaluated due to nondiagnostic images.  2. No evidence of LV thrombus by definity contrast.  3. The right ventricle has normal systolc function. The cavity was normal. There is no increase in right ventricular wall thickness. Right ventricular systolic pressure could not be assessed.  4. The aortic valve is tricuspid Moderate sclerosis of the aortic valve.  Laboratory Data:  Chemistry Recent Labs  Lab 12/16/18 2320 12/17/18 0106 12/17/18 0507 12/18/18 1026  NA 134* 134* 133* 138  K 4.3 4.2 3.9 4.0  CL 104  --  103 105  CO2 16*  --  19* 23  GLUCOSE 160*  --  267* 188*  BUN 10  --  11 22  CREATININE 0.97  --  1.15 1.14  CALCIUM 8.6*  --  8.5* 8.7*  GFRNONAA >60  --  >60 >60  GFRAA >60  --  >60 >60  ANIONGAP 14  --  11 10    Recent Labs  Lab 12/16/18 2320 12/17/18 0507  PROT 6.9 6.5  ALBUMIN 3.3* 3.1*  AST 19 19  ALT 14 15  ALKPHOS 88 85  BILITOT 1.4* 1.3*   Hematology Recent Labs  Lab 12/16/18 2320 12/17/18 0106 12/17/18 0507 12/18/18 1026  WBC 18.0*  --  15.4* 26.6*  RBC 4.18*  --  4.09* 3.97*  HGB 12.5* 12.9* 12.2* 11.8*  HCT 38.9* 38.0* 38.5* 37.0*  MCV 93.1  --  94.1 93.2  MCH 29.9  --  29.8 29.7  MCHC 32.1  --  31.7 31.9  RDW 14.6  --  14.7 14.7  PLT 382  --  344 396   Cardiac Enzymes Recent Labs  Lab 12/16/18 2320 12/17/18 0507 12/17/18 1056 12/17/18 1714  TROPONINI 0.06* 0.18* 0.18* 0.17*   No results for input(s): TROPIPOC in the last 168 hours.  BNP Recent Labs  Lab 12/16/18 2321  BNP 916.8*    DDimer No results for input(s): DDIMER in the last 168 hours.  Radiology/Studies:  Ct Angio Chest Pe W Or Wo Contrast  Result Date: 12/17/2018 CLINICAL DATA:  Shortness of breath for 1 week. Symptoms became worse yesterday. EXAM: CT ANGIOGRAPHY CHEST WITH CONTRAST TECHNIQUE: Multidetector  CT imaging of the chest was performed using the standard protocol during bolus administration of intravenous contrast. Multiplanar CT image reconstructions and MIPs were obtained to evaluate the vascular anatomy. CONTRAST:  74mL OMNIPAQUE IOHEXOL 350 MG/ML SOLN COMPARISON:  One-view chest x-ray 12/18/2018. CT of the chest 10/23/2018 FINDINGS: Cardiovascular: The heart size is normal. Coronary artery calcifications are present in the LAD and right circumflex artery. No significant pericardial effusion is present. Atherosclerotic changes are noted in the aorta and great vessel origins. There is no aortic aneurysm. No significant stenosis is evident at the origins of the great vessels. Pulmonary artery opacification is excellent. No focal filling defects are present to suggest pulmonary embolus. Mediastinum/Nodes: A right paratracheal node near the carina measures 12 mm in short axis. Small prevascular nodes are present. Other subcentimeter paratracheal nodes are present. Asymmetric right hilar adenopathy is noted. Lungs/Pleura: Diffuse patchy bilateral airspace opacities are present. Moderate bilateral pleural effusions are noted. Upper Abdomen: Visualized upper abdomen is unremarkable. Musculoskeletal: Vertebral body heights and alignment are maintained. No focal lytic or blastic lesions are present. Sternum is unremarkable. Ribs are within normal limits bilaterally. Review of the MIP images confirms the above findings. IMPRESSION: 1. No pulmonary embolus. 2. Diffuse patchy airspace disease bilaterally. Findings are most concerning for infection. 3. Bilateral pleural effusions. Airspace disease could represent edema and heart failure. 4.  Aortic Atherosclerosis (ICD10-I70.0). 5. Coronary artery disease. Electronically Signed   By: San Morelle M.D.   On: 12/17/2018 07:06   Dg Chest Port 1 View  Result Date: 12/18/2018 CLINICAL DATA:  Shortness of breath for 1 week. EXAM: PORTABLE CHEST 1 VIEW COMPARISON:   12/16/2018 and prior radiographs FINDINGS: Increasing bilateral airspace opacities noted since 12/16/2018. The cardiomediastinal silhouette is unchanged. Equivocal small RIGHT pleural effusion noted. No pneumothorax or acute bony abnormality. IMPRESSION: Increasing bilateral airspace opacities which may represent edema and/or infection. Electronically Signed   By: Margarette Canada M.D.   On: 12/18/2018 13:09   Dg Chest Portable 1 View  Result Date: 12/16/2018 CLINICAL DATA:  Shortness of breath EXAM: PORTABLE CHEST 1 VIEW COMPARISON:  10/23/2018 FINDINGS: Heart is borderline in size. Worsening airspace disease in the lower lobes, right greater than left concerning for pneumonia. Improved aeration in the right upper lobe since prior study. Possible small right effusion. No acute bony abnormality. IMPRESSION: Bilateral lower lobe airspace opacities, most confluent in the right lower lobe concerning for pneumonia. Electronically Signed   By: Rolm Baptise M.D.   On: 12/16/2018 23:33    Assessment and Plan:  1. Acute respiratory failure: patient presented with progressive DOE x1 week. Hypoxic and tachypneic on arrival. Labs with elevated WBC and BNP. CXR c/f PNA. CTA Chest without PE but again with evidence of patchy airspace disease c/f PNA. Felt to be multifactorial 2/2 PNA, COPD, and CHF. He was started on IV antibiotics, nebulizers, and steroids for possible PNA/COPD. He was transitioned from home po lasix  daily to IV  BID for possible CHF component.  - Continue PNA management per primary team - Continue diureses as below  2. Acute on chronic combined CHF: patient presented with progressive DOE. BNP elevated to 900s. CXR initially c/f PNA, repeat this morning with possible edema as well. Lasix transitioned from home po  daily to IV  BID. UOP net - today. Wt 285lbs today, up from reported weight of 269lb 11/19/2018 at telemedicine visit with Dr. Dulce Sellar. He denies dietary indiscretion.  -  Will increase lasix to  BID for now and monitor for response - Continue to monitor strict I&Os and daily weights  - Continue carvedilol, losartan, and spironolactone - May transition from losartan to entresto depending on BP   No change for now   3. Elevated troponin: Trop trend 0.06>0.18>0.18>0.17.Consistent with CHF exacervation   I do not think active ischemia   Trop peaked at >65 with NSTEMI 10/2018. - No further ischemic evaluation at this time.   4. CAD s/p PCI/DES to LAD 10/2018: No complaints of chest pain since stent placement 10/2018. He reports compliance with meds   But on review of his medications, Brilinta is not listed. He reports being discharged from the hospital and being told to take it for 30 days but was unable to get a refill. He estimates being out of Brilinta for 10 days.  - Continue aspirin and statin - Will load with brilinta  x1, then resume  BID dosing tomorrow  5. HTN: BP stable  - Continue carvedilol, losartan, spironolactone, and lasix Consider transition to Entresto  6. Paroxysmal atrial fibrillation: reported to have new onset afib 10/19/2018 with conversion to NSR on amiodarone. He was not started on anticoagulation at that time given isolated episode and need for DAPT. He is maintaining sinus rhythm this admission.  - Will stop amiodarone at this time   - Continue to monitor on telemetry for recurrent Afib.  7. Ascending aortic aneurysm:   Very mild dilation at60mm on echo 12/10/2018.  - Continue periodic monitoring    8. HLD: LDL 119 10/2018 - Continue statin  WIll need f/u   For questions or updates, please contact [facility] Please consult [email or web address] for contact info under Cardiology/STEMI.   Signed, Beatriz Stallion, PA-C  12/18/2018 1:47 PM [phone]   Pt seen and examined   I have amended note above and added my physical exam.    Pt admitted with resp distress  Being treated for infection, COPD exacerbation.  Exam with evid  of volume increase  I would increase diuresis   Follow I/O  And renal function closely  He has been out of Brilinta for 10day  I do not think there is evid to sugg stent closure.  Will continue to follow closely.  Dietrich Pates MD

## 2018-12-18 NOTE — Progress Notes (Signed)
Patient placed on 12L HFNC, patient states that he is feeling better. WOB has improved, patient stating in the low 90's. Will continue to monitor patient

## 2018-12-18 NOTE — Significant Event (Signed)
Rapid Response Event Note  Overview: Time Called: 1900 Arrival Time: 1905 Event Type: Respiratory, acute SOB, inc WOB, sats 70-80s on NRB  Initial Focused Assessment: Upon arrival, Justin Bradford was in acute distress, tachypneic RR 35, sats 76% on NRB mask at 15L and accessory muscle use (abdominal and retractions). His skin was pale, cool and diaphoretic. BBS diffuse rales throughout. He denied CP or any other complains other than SOB. HR 102 ST,  117/79. Afebrile. Pt repositioned upright in bed and placed on Bipap per RT. Once patient was on Bipap, his WOB improved significantly. Reportedly per nursing staff, pt had recently received additional dose of lasix of 80mg  at 1746. After Bipap: HR 87, 123/73, RR 27, sats 96% on Bipap 18/6 and 60% FiO2.   Interventions: -STAT PCXR -Bipap per RRT  Plan of Care (if not transferred): -Notify primary svc of events -Maintain Bipap for now. Will reassess for weaning Bipap as WOB/hypoxia improve.  -Monitor response from Lasix -Call primary svc and/or RRRN for any further assistance.  Event Summary:  Outcome: Stayed in room and stabalized  Event End Time: 1925  Justin Bradford

## 2018-12-19 ENCOUNTER — Inpatient Hospital Stay (HOSPITAL_COMMUNITY): Payer: Medicare Other

## 2018-12-19 LAB — SEDIMENTATION RATE: Sed Rate: 52 mm/hr — ABNORMAL HIGH (ref 0–16)

## 2018-12-19 LAB — GLUCOSE, CAPILLARY
Glucose-Capillary: 169 mg/dL — ABNORMAL HIGH (ref 70–99)
Glucose-Capillary: 215 mg/dL — ABNORMAL HIGH (ref 70–99)
Glucose-Capillary: 191 mg/dL — ABNORMAL HIGH (ref 70–99)
Glucose-Capillary: 185 mg/dL — ABNORMAL HIGH (ref 70–99)

## 2018-12-19 LAB — RESPIRATORY PANEL BY PCR

## 2018-12-19 LAB — CBC
HCT: 37.6 % — ABNORMAL LOW (ref 39.0–52.0)
Hemoglobin: 12.2 g/dL — ABNORMAL LOW (ref 13.0–17.0)
MCH: 29.5 pg (ref 26.0–34.0)
MCHC: 32.4 g/dL (ref 30.0–36.0)
MCV: 91 fL (ref 80.0–100.0)
Platelets: 441 10*3/uL — ABNORMAL HIGH (ref 150–400)
RBC: 4.13 MIL/uL — ABNORMAL LOW (ref 4.22–5.81)
RDW: 14.8 % (ref 11.5–15.5)
WBC: 25.2 10*3/uL — ABNORMAL HIGH (ref 4.0–10.5)
nRBC: 0 % (ref 0.0–0.2)

## 2018-12-19 LAB — BASIC METABOLIC PANEL
Anion gap: 13 (ref 5–15)
BUN: 27 mg/dL — ABNORMAL HIGH (ref 8–23)
CO2: 25 mmol/L (ref 22–32)
Calcium: 8.8 mg/dL — ABNORMAL LOW (ref 8.9–10.3)
Chloride: 102 mmol/L (ref 98–111)
Creatinine, Ser: 1.17 mg/dL (ref 0.61–1.24)
GFR calc Af Amer: >60 mL/min (ref >60)
GFR calc non Af Amer: >60 mL/min (ref >60)
Glucose, Bld: 179 mg/dL — ABNORMAL HIGH (ref 70–99)
Potassium: 3.8 mmol/L (ref 3.5–5.1)
Sodium: 140 mmol/L (ref 135–145)

## 2018-12-19 LAB — STREP PNEUMONIAE URINARY ANTIGEN: Strep Pneumo Urinary Antigen: NEGATIVE

## 2018-12-19 MED ORDER — IPRATROPIUM-ALBUTEROL 0.5-2.5 (3) MG/3ML IN SOLN
3.0000 mL | Freq: Four times a day (QID) | RESPIRATORY_TRACT | Status: DC
  Administered 2018-12-19: 3 mL via RESPIRATORY_TRACT

## 2018-12-19 MED ORDER — AZITHROMYCIN 250 MG PO TABS
500.0000 mg | ORAL_TABLET | Freq: Every day | ORAL | Status: AC
  Administered 2018-12-19 – 2018-12-23 (×5): 500 mg via ORAL
  Filled 2018-12-19 (×5): qty 2

## 2018-12-19 MED ORDER — METHYLPREDNISOLONE SODIUM SUCC 125 MG IJ SOLR
60.0000 mg | Freq: Four times a day (QID) | INTRAMUSCULAR | Status: DC
  Administered 2018-12-19 – 2018-12-21 (×8): 60 mg via INTRAVENOUS
  Filled 2018-12-19 (×8): qty 2

## 2018-12-19 MED ORDER — IPRATROPIUM-ALBUTEROL 0.5-2.5 (3) MG/3ML IN SOLN: 3.0000 mL | Freq: Three times a day (TID) | RESPIRATORY_TRACT | Status: DC

## 2018-12-19 NOTE — Evaluation (Signed)
Clinical/Bedside Swallow Evaluation Patient Details  Name: Justin Bradford. MRN: [id number] Date of Birth: 1940/10/30  Today's Date: 12/19/2018 Time: SLP Start Time (ACUTE ONLY): 1520 SLP Stop Time (ACUTE ONLY): 1535 SLP Time Calculation (min) (ACUTE ONLY): 15 min  Past Medical History:  Past Medical History:  Diagnosis Date  . Acute systolic heart failure ([place])   . Arthritis   . COPD (chronic obstructive pulmonary disease) ([place])   . Heart attack ([place])   . Hypertension   . Peripheral neuropathy   . Pneumonia    Past Surgical History:  Past Surgical History:  Procedure Laterality Date  . CORONARY STENT INTERVENTION N/A 10/16/2018   Procedure: CORONARY STENT INTERVENTION;  Surgeon: Troy Sine, MD;  Location: [facility];  Service: Cardiovascular;  Laterality: N/A;  . [place]  . LEFT HEART CATH AND CORONARY ANGIOGRAPHY N/A 10/16/2018   Procedure: LEFT HEART CATH AND CORONARY ANGIOGRAPHY;  Surgeon: Troy Sine, MD;  Location: [facility];  Service: Cardiovascular;  Laterality: N/A;  . TONSILLECTOMY     HPI:  Justin Bradford. is a 78 y.o. male with history of recent non-ST elevation MI in April 2020 status post stent placement with systolic heart failure last EF measured last week was 35%, COPD, diabetes mellitus, hypertension presents to the ER because of worsening shortness of breath on exertion over the last 1 week, hypoxic, with elevated BNP, and elevated troponins. CT angio of the chest suspicous for patchy bilateral infiltrates concerning for infection vs edema.    Assessment / Plan / Recommendation Clinical Impression  Pt presents at higher risk of aspiration d/t overall respiratory compromise. Pt presents with functional oropharyngeal abilities. During consumption of POs pt able to maintain vitals WFL (O2 at 91%) with 14L highflow. However, pt attempts to talk at length after swallowing and becomes SOB with O2 falling to 87%. Education  provided to pt on need to pace intake as well as limit talking during consumption of POs. Pt is previously known to this Probation officer and it is unlikely that pt will be able to cease talking as he is known to be hyperverbal about himself. Of note, this wirter spoke with pt's girlfriend and she states that pt has not been coughing during consumption of any POs. At this time, skilled dysphagia therapy doesn't appear indicated. ST to sign off.  SLP Visit Diagnosis: Dysphagia, unspecified (R13.10)    Aspiration Risk    Mild   Diet Recommendation Regular;Thin liquid   Liquid Administration via: Cup;Straw Medication Administration: Whole meds with liquid Supervision: Patient able to self feed Compensations: Minimize environmental distractions;Slow rate;Small sips/bites Postural Changes: Seated upright at 90 degrees    Other  Recommendations Oral Care Recommendations: Oral care BID   Follow up Recommendations None        Swallow Study   General Date of Onset: 12/17/18 HPI: Justin Bradford. is a 79 y.o. male with history of recent non-ST elevation MI in April 2020 status post stent placement with systolic heart failure last EF measured last week was 35%, COPD, diabetes mellitus, hypertension presents to the ER because of worsening shortness of breath on exertion over the last 1 week, hypoxic, with elevated BNP, and elevated troponins. CT angio of the chest suspicous for patchy bilateral infiltrates concerning for infection vs edema.  Type of Study: Bedside Swallow Evaluation Previous Swallow Assessment: none in chart Diet Prior to this Study: Regular;Thin liquids Temperature Spikes Noted: No  Respiratory Status: Nasal cannula History of Recent Intubation: No Behavior/Cognition: Alert;Cooperative;Pleasant mood Oral Cavity Assessment: Within Functional Limits Oral Care Completed by SLP: No Oral Cavity - Dentition: Adequate natural dentition Vision: Functional for self-feeding Self-Feeding  Abilities: Able to feed self Patient Positioning: Upright in bed Baseline Vocal Quality: Normal Volitional Cough: Strong Volitional Swallow: Able to elicit    Oral/Motor/Sensory Function Overall Oral Motor/Sensory Function: Within functional limits   Ice Chips Ice chips: Not tested   Thin Liquid Thin Liquid: Within functional limits Presentation: Self Fed;Straw    Nectar Thick Nectar Thick Liquid: Not tested   Honey Thick     Puree Puree: Within functional limits Presentation: Self Fed;Spoon   Solid     Solid: Within functional limits Presentation: Self Fed;Spoon      Justin Bradford 12/19/2018,5:08 PM

## 2018-12-19 NOTE — Progress Notes (Signed)
Spoke with sister Sherrin Daisy regarding patient and progress.  She states that there is no one at home, pt lives alone and has not driven since his MI in April.  She is concerned about him going home and not having any supervision.  She was updated regarding current condition. Patient aware.

## 2018-12-19 NOTE — Progress Notes (Signed)
Progress Note  Patient Name: Justin Bradford. Date of Encounter: 12/19/2018  Primary Cardiologist: Norman Herrlich, MD   Subjective   Breathing is some better  Denies CP    Inpatient Medications    Scheduled Meds: . albuterol  2 puff Inhalation QID  . aspirin EC  81 mg Oral Daily  . atorvastatin  80 mg Oral q1800  . budesonide (PULMICORT) nebulizer solution  0.25 mg Nebulization BID  . carvedilol  3.125 mg Oral BID WC  . clonazePAM  1 mg Oral QHS  . enoxaparin (LOVENOX) injection  40 mg Subcutaneous Q24H  . FLUoxetine  60 mg Oral Daily  . furosemide  80 mg Intravenous Q12H  . insulin aspart  0-9 Units Subcutaneous TID WC  . lamoTRIgine  75 mg Oral QHS  . losartan  12.5 mg Oral Daily  . mouth rinse  15 mL Mouth Rinse BID  . methylPREDNISolone (SOLU-MEDROL) injection  40 mg Intravenous Q12H  . pantoprazole  40 mg Oral Daily  . potassium chloride SA  40 mEq Oral Daily  . spironolactone  12.5 mg Oral Daily  . tamsulosin  0.4 mg Oral QHS  . ticagrelor  90 mg Oral BID   Continuous Infusions: . sodium chloride 10 mL/hr at 12/17/18 1800  . azithromycin 500 mg (12/18/18 2345)  . cefTRIAXone (ROCEPHIN)  IV 1 g (12/18/18 1816)   PRN Meds: sodium chloride, acetaminophen **OR** acetaminophen, albuterol, ondansetron **OR** ondansetron (ZOFRAN) IV   Vital Signs    Vitals:   12/18/18 1936 12/19/18 0200 12/19/18 0403 12/19/18 0408  BP: 123/73 (!) 121/93 108/64 94/66  Pulse: 82 80 79 79  Resp: (!) 27 (!) 30    Temp: 97.6 F (36.4 C)  97.9 F (36.6 C) 97.9 F (36.6 C)  TempSrc: Axillary  Oral   SpO2: 96% 93% 94% 95%  Weight:      Height:        Intake/Output Summary (Last 24 hours) at 12/19/2018 0602 Last data filed at 12/18/2018 2345 Gross per 24 hour  Intake 490 ml  Output 2500 ml  Net -2010 ml   Net neg 3.4 L   Last 3 Weights 12/18/2018 12/16/2018 11/19/2018  Weight (lbs) 285 lb 4.4 oz 264 lb 8.8 oz 270 lb  Weight (kg) 129.4 kg 120 kg 122.471 kg      Telemetry     SR   - Personally Reviewed  ECG    Not done  - Personally Reviewed  Physical Exam   GEN: No acute distress.  Laying in bed Neck: Neck is full  JVP is increased Cardiac: RRR, no murmurs, rubs, or gallops.  Respiratory  Mild rhonchi   GI: Soft, nontender, non-distended  MS: Tr LE edema; No deformity. Neuro:  Nonfocal  Psych: Normal affect   Labs    Chemistry Recent Labs  Lab 12/16/18 2320 12/17/18 0106 12/17/18 0507 12/18/18 1026  NA 134* 134* 133* 138  K 4.3 4.2 3.9 4.0  CL 104  --  103 105  CO2 16*  --  19* 23  GLUCOSE 160*  --  267* 188*  BUN 10  --  11 22  CREATININE 0.97  --  1.15 1.14  CALCIUM 8.6*  --  8.5* 8.7*  PROT 6.9  --  6.5  --   ALBUMIN 3.3*  --  3.1*  --   AST 19  --  19  --   ALT 14  --  15  --   ALKPHOS  88  --  85  --   BILITOT 1.4*  --  1.3*  --   GFRNONAA >60  --  >60 >60  GFRAA >60  --  >60 >60  ANIONGAP 14  --  11 10     Hematology Recent Labs  Lab 12/16/18 2320 12/17/18 0106 12/17/18 0507 12/18/18 1026  WBC 18.0*  --  15.4* 26.6*  RBC 4.18*  --  4.09* 3.97*  HGB 12.5* 12.9* 12.2* 11.8*  HCT 38.9* 38.0* 38.5* 37.0*  MCV 93.1  --  94.1 93.2  MCH 29.9  --  29.8 29.7  MCHC 32.1  --  31.7 31.9  RDW 14.6  --  14.7 14.7  PLT 382  --  344 396    Cardiac Enzymes Recent Labs  Lab 12/16/18 2320 12/17/18 0507 12/17/18 1056 12/17/18 1714  TROPONINI 0.06* 0.18* 0.18* 0.17*   No results for input(s): TROPIPOC in the last 168 hours.   BNP Recent Labs  Lab 12/16/18 2321  BNP 916.8*     DDimer No results for input(s): DDIMER in the last 168 hours.   Radiology    Ct Angio Chest Pe W Or Wo Contrast  Result Date: 12/17/2018 CLINICAL DATA:  Shortness of breath for 1 week. Symptoms became worse yesterday. EXAM: CT ANGIOGRAPHY CHEST WITH CONTRAST TECHNIQUE: Multidetector CT imaging of the chest was performed using the standard protocol during bolus administration of intravenous contrast. Multiplanar CT image reconstructions and  MIPs were obtained to evaluate the vascular anatomy. CONTRAST:  75mL OMNIPAQUE IOHEXOL 350 MG/ML SOLN COMPARISON:  One-view chest x-ray 12/18/2018. CT of the chest 10/23/2018 FINDINGS: Cardiovascular: The heart size is normal. Coronary artery calcifications are present in the LAD and right circumflex artery. No significant pericardial effusion is present. Atherosclerotic changes are noted in the aorta and great vessel origins. There is no aortic aneurysm. No significant stenosis is evident at the origins of the great vessels. Pulmonary artery opacification is excellent. No focal filling defects are present to suggest pulmonary embolus. Mediastinum/Nodes: A right paratracheal node near the carina measures 12 mm in short axis. Small prevascular nodes are present. Other subcentimeter paratracheal nodes are present. Asymmetric right hilar adenopathy is noted. Lungs/Pleura: Diffuse patchy bilateral airspace opacities are present. Moderate bilateral pleural effusions are noted. Upper Abdomen: Visualized upper abdomen is unremarkable. Musculoskeletal: Vertebral body heights and alignment are maintained. No focal lytic or blastic lesions are present. Sternum is unremarkable. Ribs are within normal limits bilaterally. Review of the MIP images confirms the above findings. IMPRESSION: 1. No pulmonary embolus. 2. Diffuse patchy airspace disease bilaterally. Findings are most concerning for infection. 3. Bilateral pleural effusions. Airspace disease could represent edema and heart failure. 4.  Aortic Atherosclerosis (ICD10-I70.0). 5. Coronary artery disease. Electronically Signed   By: Marin Robertshristopher  Mattern M.D.   On: 12/17/2018 07:06   Dg Chest Port 1 View  Result Date: 12/18/2018 CLINICAL DATA:  Acute respiratory distress EXAM: PORTABLE CHEST 1 VIEW COMPARISON:  December 18, 2018 FINDINGS: Multifocal airspace opacities are again noted. The heart size is stable. There is no pneumothorax. No large pleural effusion. There is no  acute osseous abnormality. Bilateral pleural effusions are again noted. IMPRESSION: Persistent unchanged multifocal airspace opacities which may represent pulmonary edema or an atypical infectious process. Electronically Signed   By: Katherine Mantlehristopher  Green M.D.   On: 12/18/2018 20:15   Dg Chest Port 1 View  Result Date: 12/18/2018 CLINICAL DATA:  Shortness of breath for 1 week. EXAM: PORTABLE CHEST 1 VIEW COMPARISON:  12/16/2018 and prior radiographs FINDINGS: Increasing bilateral airspace opacities noted since 12/16/2018. The cardiomediastinal silhouette is unchanged. Equivocal small RIGHT pleural effusion noted. No pneumothorax or acute bony abnormality. IMPRESSION: Increasing bilateral airspace opacities which may represent edema and/or infection. Electronically Signed   By: Harmon PierJeffrey  Hu M.D.   On: 12/18/2018 13:09    Cardiac Studies   Relevant CV Studies: Echocardiogram 12/10/2018: IMPRESSIONS   1. The left ventricle has moderately reduced systolic function, with an ejection fraction of 35%. The cavity size was normal. There is moderate concentric left ventricular hypertrophy. Left ventricular diastolic Doppler parameters are indeterminate. 2. The right ventricle has normal systolic function. The cavity was normal. There is no increase in right ventricular wall thickness. 3. The mitral valve is degenerative. There is mild to moderate mitral annular calcification present. No evidence of mitral valve stenosis. 4. Mild thickening of the aortic valve. Mild focal calcification of the aortic valve. Aortic valve regurgitation is mild by color flow Doppler. 5. 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 6. Stage 1: 1: Multiple segmental abnormalities exist. See findings.  Left heart catheterization 10/2018  Prox RCA lesion is 20% stenosed.  Ost LAD to Prox LAD lesion is 100% stenosed.  Dist Cx lesion is 60% stenosed.  Post intervention, there is a 55% residual  stenosis.  A stent was successfully placed.  Late presentation anterior wall myocardial infarction secondary to total proximal occlusion of a severely calcified proximal LAD with TIMI 0 flow.  Mild to moderate concomitant CAD with 60% distal AV groove circumflex stenosis and calcified dominant RCA with 20% proximal stenosis (the RCA was not optimally selectively engaged but visualization was adequate).  LVEDP 27 mmHg.  Difficult but successful PCI to a totally occluded severely calcified proximal LAD with PTCA, Cutting Balloon, and ultimate stenting with a 3.0 x 38 mm Resolute DES stent postdilated to 3.3 to 3.5 mm. There was still moderate narrowing in the proximal portion of the stent which could not be optimally expanded secondary to the significant residual calcification. Aggrastat was administered due to transient low flow which ultimately normalized.  RECOMMENDATION: DAPT for minimum of 1 year. Patient will be transported to [facility] for close observation. He will need diuresis. With his late presentation MI and revascularization, there is likelihood for significant residual myocardial damage.  Echocardiogram 10/2018: IMPRESSIONS   1. The left ventricle has moderate-severely reduced systolic function, with an ejection fraction of 30-35%. The cavity size was normal. Left ventricular diastolic function could not be evaluated due to nondiagnostic images. 2. No evidence of LV thrombus by definity contrast. 3. The right ventricle has normal systolc function. The cavity was normal. There is no increase in right ventricular wall thickness. Right ventricular systolic pressure could not be assessed. 4. The aortic valve is tricuspid Moderate sclerosis of the aortic valve.   Patient Profile     78 y.o. 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 Blake DivineAkula.  Assessment & Plan    1  Dyspnea  Appears mutiactoral   CCM is seeing pt  On ABX On steroids  On lasix    2  Acute on chronic systolic CHF  Volume still appear to be increased  Would continue IV diuresis.  Watch renal function and electrolytes     3  CAD  Pt with recent intervention    Had been off Brilinta for about 10 days (no refill on Rx after D/C)   Trivial elevaton in troponin most likely represents demand ischemia in setting of hypoxia/pulmonary problesm NO evid for stent closure    4   HTN  5  PAF   Had in hosp in April 2020 in setting of MI  Plan at that hospitalization was for short term amiodarone 200 mg   He was still on on admit   Has had no recurrence since here in hospital   Amiodarone was discontinued ib 6/18.   Pt has not been on anticoagulation.(would have been triple Rx if he was )  Follow tele for now    For questions or updates, please contact [facility] Please consult [email or web address] for contact info under        Signed, Dorris Carnes, MD  12/19/2018, 6:02 AM

## 2018-12-19 NOTE — Consult Note (Signed)
NAME:  Justin Whittley., MRN:  [id number], DOB:  [birth year], LOS: 2 ADMISSION DATE:  12/16/2018, CONSULTATION DATE:  12/19/2018 REFERRING MD:  Dr. Karleen [facility], [facility], CHIEF COMPLAINT:  Short of breath   Brief History   78 yo male former smoker with progressive dyspnea, hypoxia and pulmonary infiltrates after having DES in April 1191 with systolic CHF.  History of present illness   78 yo male former smoker was in hospital in April 2020 with chest pain from NSTEMI and acute systolic CHF.  He was started amiodarone and heparin 10/19/18 for A fib with RVR.  He was found to have pulmonary infiltrates on CT chest and pulmonary consulted.  Concern for aspiration pneumonitis and pulmonary edema.  He was discharged to rehab and felt his breathing was better.  He presented to the ER on 12/16/18 with one week of progressive dyspnea.    He was not having cough, sputum, fever, chest pain, sinus congestion, sore throat, dysphagia, skin rash, joint swelling, leg swelling.  No sick exposures, travel history, animal exposures.  He is from [place].  Retired, but worked in Pensions consultant.  Past Medical History  CAD, HTN, Systolic CHF, Neuropathy, OA  Significant Hospital Events   6/16 Admit 6/19 Start steroids  Consults:  Cardiology 6/18 CHF, CAD, PAF  Procedures:    Significant Diagnostic Tests:  PFT 09/18/18 >> FEV1 2.30 (72%), FEV1% 73%, TLC 8.44 (113%), DLCO 101% CT angio chest 10/15/18 >> scattered GGO CT chest 10/23/18 >> small effusions, increased opacities in upper lobes  Echo 12/10/18 >> EF 35%, mod LVH, ascending aorta 40 mm CT angio chest 12/17/18 >> diffuse b/l ASD, moderate b/l effusions  All above imaging studies reviewed by me  Micro Data:  COVID 6/16 >> negative Send out COVID 6/16 >> negative Blood 6/17 >>  RVP 6/19 >>   Antimicrobials:  Rocephin 6/17 >> Zithromax 6/17 >>   Interim history/subjective:    Objective   Blood pressure 110/80, pulse 86, temperature 97.9 F (36.6 C),  temperature source Oral, resp. rate (!) 29, height 6' (1.829 m), weight 129.4 kg, SpO2 95 %.    FiO2 (%):  [60 %] 60 %   Intake/Output Summary (Last 24 hours) at 12/19/2018 1136 Last data filed at 12/19/2018 0943 Gross per 24 hour  Intake 250 ml  Output 2850 ml  Net -2600 ml   Filed Weights   12/16/18 2307 12/18/18 0607  Weight: 120 kg 129.4 kg    Examination:  General - alert Eyes - pupils reactive ENT - no sinus tenderness, no stridor Cardiac - regular rate/rhythm, no murmur Chest - b/l crackles, no wheeze Abdomen - soft, non tender, + bowel sounds Extremities - no cyanosis, clubbing, or edema Skin - no rashes Neuro - normal strength, moves extremities, follows commands Lymphatics - no lymphadenopathy Psych - normal mood and behavior   Discussion:  He has progressive hypoxia and pulmonary infiltrates with b/l pleural effusions.  He has been on antibiotics and diuretics w/o improvement.  Concern if for aspiration with chemical pneumonitis or inflammatory process.  He was previously on amiodarone, but his radiographic findings appear to predate when he was started on amiodarone.  This was d/c'ed on 6/18.  Assessment & Plan:   Acute hypoxic respiratory failure with pulmonary infiltrates. Plan - oxygen to keep SpO2 > 90% - Bipap prn; discussed possibility of ETT if he continues to get worse - day 3 of ABx - speech swallow assessment - send serology - continue lasix -  add solumedrol  Reported history of COPD, but PFTs aren't consistent with this. Plan - change BDs to prn  Acute on chronic systolic CHF, CAD, PAF. Plan - per primary team and cardiology  Hx of DM. Plan - SSI  Best practice:  Diet: regular diet DVT prophylaxis: lovenox GI prophylaxis: protonix Mobility: as tolerated Code Status: full code Disposition: Progressive care  Labs   CBC: Recent Labs  Lab 12/16/18 2320 12/17/18 0106 12/17/18 0507 12/18/18 1026 12/19/18 0732  WBC 18.0*  --   15.4* 26.6* 25.2*  NEUTROABS 13.8*  --  14.4*  --   --   HGB 12.5* 12.9* 12.2* 11.8* 12.2*  HCT 38.9* 38.0* 38.5* 37.0* 37.6*  MCV 93.1  --  94.1 93.2 91.0  PLT 382  --  344 396 441*    Basic Metabolic Panel: Recent Labs  Lab 12/16/18 2320 12/17/18 0106 12/17/18 0507 12/18/18 1026 12/19/18 0732  NA 134* 134* 133* 138 140  K 4.3 4.2 3.9 4.0 3.8  CL 104  --  103 105 102  CO2 16*  --  19* 23 25  GLUCOSE 160*  --  267* 188* 179*  BUN 10  --  11 22 27*  CREATININE 0.97  --  1.15 1.14 1.17  CALCIUM 8.6*  --  8.5* 8.7* 8.8*  MG  --   --  2.2  --   --    GFR: Estimated Creatinine Clearance: 74.7 mL/min (by C-G formula based on SCr of 1.17 mg/dL). Recent Labs  Lab 12/16/18 2320 12/17/18 0024 12/17/18 0507 12/18/18 1026 12/19/18 0732  WBC 18.0*  --  15.4* 26.6* 25.2*  LATICACIDVEN  --  1.4  --   --   --     Liver Function Tests: Recent Labs  Lab 12/16/18 2320 12/17/18 0507  AST 19 19  ALT 14 15  ALKPHOS 88 85  BILITOT 1.4* 1.3*  PROT 6.9 6.5  ALBUMIN 3.3* 3.1*   No results for input(s): LIPASE, AMYLASE in the last 168 hours. No results for input(s): AMMONIA in the last 168 hours.  ABG    Component Value Date/Time   PHART 7.402 12/17/2018 0106   PCO2ART 31.2 (L) 12/17/2018 0106   PO2ART 72.0 (L) 12/17/2018 0106   HCO3 19.5 (L) 12/17/2018 0106   TCO2 20 (L) 12/17/2018 0106   ACIDBASEDEF 4.0 (H) 12/17/2018 0106   O2SAT 95.0 12/17/2018 0106     Coagulation Profile: No results for input(s): INR, PROTIME in the last 168 hours.  Cardiac Enzymes: Recent Labs  Lab 12/16/18 2320 12/17/18 0507 12/17/18 1056 12/17/18 1714  TROPONINI 0.06* 0.18* 0.18* 0.17*    HbA1C: No results found for: HGBA1C  CBG: Recent Labs  Lab 12/18/18 1149 12/18/18 1716 12/18/18 2049 12/19/18 0811 12/19/18 1132  GLUCAP 163* 188* 163* 169* 215*    Review of Systems:   Reviewed and negative.  Past Medical History  He,  has a past medical history of Acute systolic heart  failure (HCC), Arthritis, COPD (chronic obstructive pulmonary disease) (HCC), Heart attack (HCC), Hypertension, Peripheral neuropathy, and Pneumonia.   Surgical History    Past Surgical History:  Procedure Laterality Date  . CORONARY STENT INTERVENTION N/A 10/16/2018   Procedure: CORONARY STENT INTERVENTION;  Surgeon: Lennette Bihari, MD;  Location: [facility] INVASIVE CV LAB;  Service: Cardiovascular;  Laterality: N/A;  . KNEE SURGERY  1994 & 1998  . LEFT HEART CATH AND CORONARY ANGIOGRAPHY N/A 10/16/2018   Procedure: LEFT HEART CATH AND CORONARY ANGIOGRAPHY;  Surgeon: Lennette BihariKelly, Thomas A, MD;  [facility];  Service: Cardiovascular;  Laterality: N/A;  . TONSILLECTOMY       Social History   reports that he quit smoking about 13 years ago. His smoking use included cigarettes. He smoked 2.00 packs per day. He has never used smokeless tobacco. He reports previous alcohol use. He reports that he does not use drugs.   Family History   His family history includes Colon cancer in his mother; Heart failure in his mother; Kidney cancer in his father and mother; Thyroid disease in his sister.   Allergies No Known Allergies   Home Medications  Prior to Admission medications   Medication Sig Start Date End Date Taking? Authorizing Provider  acetaminophen (TYLENOL) 325 MG tablet Take 2 tablets (650 mg total) by mouth every 4 (four) hours as needed for headache or mild pain. 11/06/18  Yes Angiulli, Mcarthur Rossettianiel J, PA-C  amiodarone (PACERONE) 200 MG tablet Take 1 tablet (200 mg total) by mouth daily. 11/06/18  Yes Angiulli, Mcarthur Rossettianiel J, PA-C  aspirin EC 81 MG tablet Take 81 mg by mouth daily.   Yes [provider]  atorvastatin (LIPITOR) 80 MG tablet Take 1 tablet (80 mg total) by mouth daily at 6 PM. 11/06/18  Yes Angiulli, Mcarthur Rossettianiel J, PA-C  carvedilol (COREG) 3.125 MG tablet Take 1 tablet (3.125 mg total) by mouth 2 (two) times daily with a meal for 30 days. 11/06/18 12/17/27 Yes Angiulli, Mcarthur Rossettianiel J, PA-C   clonazePAM (KLONOPIN) 1 MG tablet Take 1 tablet (1 mg total) by mouth at bedtime. 11/06/18  Yes Angiulli, Mcarthur Rossettianiel J, PA-C  FLUoxetine (PROZAC) 20 MG capsule Take 3 capsules (60 mg total) by mouth daily for 30 days. 11/06/18 12/17/27 Yes Angiulli, Mcarthur Rossettianiel J, PA-C  furosemide (LASIX) 80 MG tablet Take 1 tablet (80 mg total) by mouth daily for 30 days. 11/06/18 12/17/27 Yes Angiulli, Mcarthur Rossettianiel J, PA-C  lamoTRIgine (LAMICTAL) 25 MG tablet Take 3 tablets (75 mg total) by mouth at bedtime. 11/06/18  Yes Angiulli, Mcarthur Rossettianiel J, PA-C  losartan (COZAAR) 25 MG tablet Take 0.5 tablets (12.5 mg total) by mouth daily for 30 days. 11/06/18 12/17/27 Yes Angiulli, Mcarthur Rossettianiel J, PA-C  metFORMIN (GLUCOPHAGE) 500 MG tablet Take 0.5 tablets (250 mg total) by mouth every morning. Patient taking differently: Take 250 mg by mouth daily with breakfast.  11/06/18  Yes Angiulli, Mcarthur Rossettianiel J, PA-C  pantoprazole (PROTONIX) 40 MG tablet Take 1 tablet (40 mg total) by mouth daily. 11/07/18  Yes Angiulli, Mcarthur Rossettianiel J, PA-C  potassium chloride SA (K-DUR) 20 MEQ tablet Take 2 tablets (40 mEq total) by mouth daily for 30 days. 11/06/18 12/17/27 Yes Angiulli, Mcarthur Rossettianiel J, PA-C  spironolactone (ALDACTONE) 25 MG tablet Take 0.5 tablets (12.5 mg total) by mouth daily for 30 days. 11/06/18 12/17/27 Yes Angiulli, Mcarthur Rossettianiel J, PA-C  tamsulosin (FLOMAX) 0.4 MG CAPS capsule Take 1 capsule (0.4 mg total) by mouth at bedtime. 11/06/18  Yes Angiulli, Mcarthur Rossettianiel J, PA-C  ticagrelor (BRILINTA) 90 MG TABS tablet Take 1 tablet (90 mg total) by mouth 2 (two) times daily. 12/19/18   Kroeger, Ovidio KinKrista M., PA-C     Coralyn HellingVineet Michae Grimley, MD [facility] 12/19/2018, 12:06 PM

## 2018-12-19 NOTE — Progress Notes (Signed)
PROGRESS NOTE    Justin Bradford.  IZX:[id number] DOB: [birth year] DOA: 12/16/2018 PCP: Forrest Moron, MD    Brief Narrative:  Justin Bradfordis a 78 y.o.malewithhistory of recent non-ST elevation MI in April 2020 status post stent placement with systolic heart failure last EF measured last week was 35%, COPD, diabetes mellitus, hypertension presents to the ER because of worsening shortness of breath on exertion over the last 1 week, hypoxic, with elevated BNP, and elevated troponins. CT angio of the chest suspicous for patchy bilateral infiltrates concerning for infection vs edema.   His COVID inpatient test was negative.  He was started on Broad spectrum IV antibiotics, and IV lasix for diuresis and IV steroids.  Repeat NOVEL Coronavirus test ordered and patient on droplet precautions.    Assessment & Plan:   Principal Problem:   Acute respiratory failure with hypoxia (HCC) Active Problems:   COPD (chronic obstructive pulmonary disease) (HCC)   Diabetes mellitus type 2 in obese (HCC)   Chronic systolic heart failure (HCC)   CAD in native artery   PAF (paroxysmal atrial fibrillation) (HCC)   CAP (community acquired pneumonia)   Acute respiratory failure with hypoxia Differential include acute systolic heart failure and community acquired pneumonia and COPD exacerbation.  Admitted to progressive unit.  Though covid 19 test is negative, in view of the bilateral patchy infiltrates a repeat send out test sent, which is negative. He remains afebrile, without productive cough.  Meanwhile started the patient on IV rocephin and zithromax, IV steroids and bronchodilators.   he was started IV lasix 40 mg BID, without significant diuresis.  Cardiology increased the lasix to 80 mg BID.   strict intake and output.  Cardiology consulted for Acute CHF.   Overnight pt had respiratory distress and was NRB with sats in low 80's and required the use of BIPAP for a few hours, he was  transitioned to 12 lit HF [place] oxygen this am.  His CXR remains the same with diffuse pathcy infiltrates, suspicious for edema/ atypical infection.  Respiratory panel sent. And pulmonology on board to assist.    CAD: Pt on aspirin , statin, coreg, and losartan. Continue the same. Cardiology restarted the Brillinta.  He denies any chest pain at this time.  Pt off Brillinta for unclear reasons.    Type 2 DM: CBG (last 3)  Recent Labs    12/18/18 1716 12/18/18 2049 12/19/18 0811  GLUCAP 188* 163* 169*   Resume SSI while inpatient.    Anxiety and depression: Resume home meds.    Paroxysmal atrial fibrillation  On amiodarone which has been stopped as pt is in NSR.       DVT prophylaxis: lovenox.   Code Status: full code.  Family Communication: none at bedside.  Disposition Plan: pending clinical improvement.    Consultants:   Cardiology. Dr Tenny Craw  Pulmonology Dr Craige Cotta  Procedures:  CT angiogram  of the chest.   Antimicrobials: Rocephin and zithromax.   Subjective: Still significantly sob on talking. On high flow oxygen.   Objective: Vitals:   12/19/18 0733 12/19/18 0737 12/19/18 0809 12/19/18 1052  BP:   110/80   Pulse:   86   Resp:   (!) 29   Temp:   97.9 F (36.6 C)   TempSrc:   Oral   SpO2: 91% 92% 91% 95%  Weight:      Height:        Intake/Output Summary (Last 24 hours) at 12/19/2018 1059 Last  data filed at 12/19/2018 0943 Gross per 24 hour  Intake 490 ml  Output 3450 ml  Net -2960 ml   Filed Weights   12/16/18 2307 12/18/18 0607  Weight: 120 kg 129.4 kg    Examination:  General exam: mil distress from sob, and is on 12 lit  HF of [place] oxygen Respiratory system: bilateral rales and  Wheezing posteriorly.  Cardiovascular system: S1 & S2 heard, RRR.  Gastrointestinal system: Abdomen is nondistended, soft and nontender. No organomegaly or masses felt. Normal bowel sounds heard. Central nervous system: Alert and oriented.non focal.  Extremities: Symmetric 5 x 5 power. Skin: No rashes, lesions or ulcers Psychiatry:  Mood & affect appropriate.     Data Reviewed: I have personally reviewed following labs and imaging studies  CBC: Recent Labs  Lab 12/16/18 2320 12/17/18 0106 12/17/18 0507 12/18/18 1026 12/19/18 0732  WBC 18.0*  --  15.4* 26.6* 25.2*  NEUTROABS 13.8*  --  14.4*  --   --   HGB 12.5* 12.9* 12.2* 11.8* 12.2*  HCT 38.9* 38.0* 38.5* 37.0* 37.6*  MCV 93.1  --  94.1 93.2 91.0  PLT 382  --  344 396 106*   Basic Metabolic Panel: Recent Labs  Lab 12/16/18 2320 12/17/18 0106 12/17/18 0507 12/18/18 1026 12/19/18 0732  NA 134* 134* 133* 138 140  K 4.3 4.2 3.9 4.0 3.8  CL 104  --  103 105 102  CO2 16*  --  19* 23 25  GLUCOSE 160*  --  267* 188* 179*  BUN 10  --  11 22 27*  CREATININE 0.97  --  1.15 1.14 1.17  CALCIUM 8.6*  --  8.5* 8.7* 8.8*  MG  --   --  2.2  --   --    GFR: Estimated Creatinine Clearance: 74.7 mL/min (by C-G formula based on SCr of 1.17 mg/dL). Liver Function Tests: Recent Labs  Lab 12/16/18 2320 12/17/18 0507  AST 19 19  ALT 14 15  ALKPHOS 88 85  BILITOT 1.4* 1.3*  PROT 6.9 6.5  ALBUMIN 3.3* 3.1*   No results for input(s): LIPASE, AMYLASE in the last 168 hours. No results for input(s): AMMONIA in the last 168 hours. Coagulation Profile: No results for input(s): INR, PROTIME in the last 168 hours. Cardiac Enzymes: Recent Labs  Lab 12/16/18 2320 12/17/18 0507 12/17/18 1056 12/17/18 1714  TROPONINI 0.06* 0.18* 0.18* 0.17*   BNP (last 3 results) No results for input(s): PROBNP in the last 8760 hours. HbA1C: No results for input(s): HGBA1C in the last 72 hours. CBG: Recent Labs  Lab 12/18/18 0736 12/18/18 1149 12/18/18 1716 12/18/18 2049 12/19/18 0811  GLUCAP 173* 163* 188* 163* 169*   Lipid Profile: No results for input(s): CHOL, HDL, LDLCALC, TRIG, CHOLHDL, LDLDIRECT in the last 72 hours. Thyroid Function Tests: Recent Labs    12/17/18 0507   TSH 3.195   Anemia Panel: No results for input(s): VITAMINB12, FOLATE, FERRITIN, TIBC, IRON, RETICCTPCT in the last 72 hours. Sepsis Labs: Recent Labs  Lab 12/17/18 0024  LATICACIDVEN 1.4    Recent Results (from the past 240 hour(s))  SARS Coronavirus 2     Status: None   Collection Time: 12/16/18 11:00 PM  Result Value Ref Range Status   SARS Coronavirus 2 NOT DETECTED NOT DETECTED Final    Comment: (NOTE) SARS-CoV-2 target nucleic acids are NOT DETECTED. The SARS-CoV-2 RNA is generally detectable in upper and lower respiratory specimens during the acute phase of infection.  Negative  results do not preclude SARS-CoV-2 infection, do not rule out co-infections with other pathogens, and should not be used as the sole basis for treatment or other patient management decisions.  Negative results must be combined with clinical observations, patient history, and epidemiological information. The expected result is Not Detected. Fact Sheet for Patients: http://www.biofiredefense.com/wp-content/uploads/2020/03/BIOFIRE-COVID -19-patients.pdf Fact Sheet for Healthcare Providers: http://www.biofiredefense.com/wp-content/uploads/2020/03/BIOFIRE-COVID -19-hcp.pdf This test is not yet approved or cleared by the [place] FDA and  has been authorized for detection and/or diagnosis of SARS-CoV-2 by FDA under an Emergency Use Authorization (EUA).  This EUA will remain in effec t (meaning this test can be used) for the duration of  the COVID-19 declaration under Section 564(b)(1) of the Act, 21 U.S.C. section 360bbb-3(b)(1), unless the authorization is terminated or revoked sooner. Performed at [facility], [address] [place], [place] [address]   Novel Coronavirus,NAA,(SEND-OUT TO REF LAB - TAT 24-48 hrs); Hosp Order     Status: None   Collection Time: 12/16/18 11:00 PM   Specimen: Nasopharyngeal Swab; Respiratory  Result Value Ref Range Status   SARS-CoV-2, NAA NOT  DETECTED NOT DETECTED Final    Comment: (NOTE) This test was developed and its performance characteristics determined by [facility]. This test has not been FDA cleared or approved. This test has been authorized by FDA under an Emergency Use Authorization (EUA). This test is only authorized for the duration of time the declaration that circumstances exist justifying the authorization of the emergency use of in vitro diagnostic tests for detection of SARS-CoV-2 virus and/or diagnosis of COVID-19 infection under section 564(b)(1) of the Act, 21 U.S.C. 604VWU-9(W)(1360bbb-3(b)(1), unless the authorization is terminated or revoked sooner. When diagnostic testing is negative, the possibility of a false negative result should be considered in the context of a patient's recent exposures and the presence of clinical signs and symptoms consistent with COVID-19. An individual without symptoms of COVID-19 and who is not shedding SARS-CoV-2 virus would expect to have a negative (not detected) result in this assay. Performed  At: [facility] [place] [address] [place], [place] [id number] Jolene SchimkeNagendra Sanjai MD AO:[id number]:[phone]    Coronavirus Source NASOPHARYNGEAL  Final    Comment: Performed at [facility], [address] [place], [place] [address]  Blood culture (routine x 2)     Status: None (Preliminary result)   Collection Time: 12/17/18 12:25 AM   Specimen: BLOOD LEFT ARM  Result Value Ref Range Status   Specimen Description BLOOD LEFT ARM  Final   Special Requests   Final    BOTTLES DRAWN AEROBIC AND ANAEROBIC Blood Culture adequate volume   Culture   Final    NO GROWTH 2 DAYS Performed at [facility], [address] [place], [place] [address]    Report Status PENDING  Incomplete  Blood culture (routine x 2)     Status: None (Preliminary result)   Collection Time: 12/17/18  1:40 AM   Specimen: BLOOD LEFT ARM  Result Value Ref Range Status   Specimen Description BLOOD LEFT  ARM  Final   Special Requests   Final    BOTTLES DRAWN AEROBIC AND ANAEROBIC Blood Culture adequate volume   Culture   Final    NO GROWTH 2 DAYS Performed at [facility], [address] [place], [place] [address]    Report Status PENDING  Incomplete         Radiology Studies: Dg Chest Port 1 View  Result Date: 12/18/2018  CLINICAL DATA:  Acute respiratory distress EXAM: PORTABLE CHEST 1 VIEW COMPARISON:  December 18, 2018 FINDINGS: Multifocal airspace opacities are again noted. The heart size is stable. There is no pneumothorax. No large pleural effusion. There is no acute osseous abnormality. Bilateral pleural effusions are again noted. IMPRESSION: Persistent unchanged multifocal airspace opacities which may represent pulmonary edema or an atypical infectious process. Electronically Signed   By: Katherine Mantlehristopher  Green M.D.   On: 12/18/2018 20:15   Dg Chest Port 1 View  Result Date: 12/18/2018 CLINICAL DATA:  Shortness of breath for 1 week. EXAM: PORTABLE CHEST 1 VIEW COMPARISON:  12/16/2018 and prior radiographs FINDINGS: Increasing bilateral airspace opacities noted since 12/16/2018. The cardiomediastinal silhouette is unchanged. Equivocal small RIGHT pleural effusion noted. No pneumothorax or acute bony abnormality. IMPRESSION: Increasing bilateral airspace opacities which may represent edema and/or infection. Electronically Signed   By: Harmon PierJeffrey  Hu M.D.   On: 12/18/2018 13:09        Scheduled Meds: . aspirin EC  81 mg Oral Daily  . atorvastatin  80 mg Oral q1800  . azithromycin  500 mg Oral QHS  . budesonide (PULMICORT) nebulizer solution  0.25 mg Nebulization BID  . carvedilol  3.125 mg Oral BID WC  . clonazePAM  1 mg Oral QHS  . enoxaparin (LOVENOX) injection  40 mg Subcutaneous Q24H  . FLUoxetine  60 mg Oral Daily  . furosemide  80 mg Intravenous Q12H  . insulin aspart  0-9 Units Subcutaneous TID WC  . ipratropium-albuterol  3 mL Nebulization Q6H  . lamoTRIgine  75 mg  Oral QHS  . losartan  12.5 mg Oral Daily  . mouth rinse  15 mL Mouth Rinse BID  . methylPREDNISolone (SOLU-MEDROL) injection  40 mg Intravenous Q12H  . pantoprazole  40 mg Oral Daily  . potassium chloride SA  40 mEq Oral Daily  . spironolactone  12.5 mg Oral Daily  . tamsulosin  0.4 mg Oral QHS  . ticagrelor  90 mg Oral BID   Continuous Infusions: . sodium chloride 10 mL/hr at 12/17/18 1800  . cefTRIAXone (ROCEPHIN)  IV 1 g (12/18/18 1816)     LOS: 2 days    Time spent: 38 minutes     Kathlen ModyVijaya Kilee Hedding, MD [facility] Pager [phone]   If 7PM-7AM, please contact night-coverage [email or web address] Password [email or web address] 12/19/2018, 10:59 AM

## 2018-12-19 NOTE — Plan of Care (Signed)
  Problem: Clinical Measurements: Goal: Respiratory complications will improve Outcome: Not Progressing  Pt required bipap emergently earlier in the shift.  Is now back to nasal cannula, satting low 90's with little activity tolerance.

## 2018-12-20 LAB — GLUCOSE, CAPILLARY
Glucose-Capillary: 177 mg/dL — ABNORMAL HIGH (ref 70–99)
Glucose-Capillary: 200 mg/dL — ABNORMAL HIGH (ref 70–99)
Glucose-Capillary: 160 mg/dL — ABNORMAL HIGH (ref 70–99)
Glucose-Capillary: 304 mg/dL — ABNORMAL HIGH (ref 70–99)

## 2018-12-20 LAB — SJOGRENS SYNDROME-B EXTRACTABLE NUCLEAR ANTIBODY: SSB (La) (ENA) Antibody, IgG: <0.2 AI (ref 0.0–0.9)

## 2018-12-20 LAB — BASIC METABOLIC PANEL
Anion gap: 13 (ref 5–15)
BUN: 30 mg/dL — ABNORMAL HIGH (ref 8–23)
CO2: 26 mmol/L (ref 22–32)
Calcium: 8.8 mg/dL — ABNORMAL LOW (ref 8.9–10.3)
Chloride: 101 mmol/L (ref 98–111)
Creatinine, Ser: 1.08 mg/dL (ref 0.61–1.24)
GFR calc Af Amer: >60 mL/min (ref >60)
GFR calc non Af Amer: >60 mL/min (ref >60)
Glucose, Bld: 203 mg/dL — ABNORMAL HIGH (ref 70–99)
Potassium: 3.2 mmol/L — ABNORMAL LOW (ref 3.5–5.1)
Sodium: 140 mmol/L (ref 135–145)

## 2018-12-20 LAB — RHEUMATOID FACTOR: Rheumatoid fact SerPl-aCnc: 17.6 IU/mL — ABNORMAL HIGH (ref 0.0–13.9)

## 2018-12-20 LAB — ANTI-SCLERODERMA ANTIBODY: Scleroderma (Scl-70) (ENA) Antibody, IgG: <0.2 AI (ref 0.0–0.9)

## 2018-12-20 LAB — SJOGRENS SYNDROME-A EXTRACTABLE NUCLEAR ANTIBODY: SSA (Ro) (ENA) Antibody, IgG: <0.2 AI (ref 0.0–0.9)

## 2018-12-20 LAB — ANA: Anti Nuclear Antibody (ANA): NEGATIVE

## 2018-12-20 LAB — ANTI-JO 1 ANTIBODY, IGG: Anti JO-1: <0.2 AI (ref 0.0–0.9)

## 2018-12-20 NOTE — Progress Notes (Signed)
NAME:  Justin Bradford., MRN:  [id number], DOB:  12/22/1940, LOS: 3 ADMISSION DATE:  12/16/2018, CONSULTATION DATE:  12/19/2018 REFERRING MD:  Dr. Blake Divine, [facility], CHIEF COMPLAINT:  Short of breath   Brief History   78 yo male former smoker with progressive dyspnea, hypoxia and pulmonary infiltrates after having DES in April 2020 with systolic CHF.  Past Medical History  CAD, HTN, Systolic CHF, Neuropathy, OA  Significant Hospital Events   6/16 Admit 6/19 Start steroids; speech swallow assessment >> no issues  Consults:  Cardiology 6/18 CHF, CAD, PAF  Procedures:    Significant Diagnostic Tests:  PFT 09/18/18 >> FEV1 2.30 (72%), FEV1% 73%, TLC 8.44 (113%), DLCO 101% CT angio chest 10/15/18 >> scattered GGO CT chest 10/23/18 >> small effusions, increased opacities in upper lobes  Echo 12/10/18 >> EF 35%, mod LVH, ascending aorta 40 mm CT angio chest 12/17/18 >> diffuse b/l ASD, moderate b/l effusions  Micro Data:  COVID 6/16 >> negative Send out COVID 6/16 >> negative Blood 6/17 >>  RVP 6/19 >> negative Pneumococcal Ag 6/19 >> negative Legionella Ag 6/19 >>  Antimicrobials:  Rocephin 6/17 >> Zithromax 6/17 >>   Interim history/subjective:  Slept better last night.  Didn't need Bipap.  Feels like he is getting more air into his lungs.  Objective   Blood pressure 105/76, pulse 77, temperature 97.6 F (36.4 C), temperature source Oral, resp. rate 20, height 6' (1.829 m), weight 120.6 kg, SpO2 95 %.        Intake/Output Summary (Last 24 hours) at 12/20/2018 1418 Last data filed at 12/20/2018 1159 Gross per 24 hour  Intake 720 ml  Output 2400 ml  Net -1680 ml   Filed Weights   12/16/18 2307 12/18/18 0607 12/20/18 0518  Weight: 120 kg 129.4 kg 120.6 kg    Examination:  General - alert Eyes - pupils reactive ENT - no sinus tenderness, no stridor Cardiac - regular rate/rhythm, no murmur Chest - better air movement, no wheeze Abdomen - soft, non tender, + bowel  sounds Extremities - no cyanosis, clubbing, or edema Skin - no rashes Neuro - normal strength, moves extremities, follows commands   Discussion:  He has progressive hypoxia and pulmonary infiltrates with b/l pleural effusions.  He has been on antibiotics and diuretics w/o improvement.  Concern for an inflammatory process.  He was previously on amiodarone, but his radiographic findings appear to predate when he was started on amiodarone.  This was d/c'ed on 6/18.  There is some clinic improvement with addition of steroids.  Assessment & Plan:   Acute hypoxic respiratory failure with pulmonary infiltrates. Plan - oxygen to keep SpO2 > 90% - day 4 of ABx - continue solumedrol 60 mg q6h - f/u serology from 6/19 - continue lasix - Bipap prn - f/u CXR 6/21  Reported history of COPD, but PFTs aren't consistent with this. Plan - prn BDs  Acute on chronic systolic CHF, CAD, PAF. Plan - per primary team and cardiology  Hx of DM with steroid induced hyperglycemia. Plan - SSI  Best practice:  Diet: regular diet DVT prophylaxis: lovenox GI prophylaxis: protonix Mobility: as tolerated Code Status: full code Disposition: Progressive care  Labs    CMP Latest Ref Rng & Units 12/20/2018 12/19/2018 12/18/2018  Glucose 70 - 99 mg/dL 382(N) 053(Z) 767(H)  BUN 8 - 23 mg/dL 41(P) 37(T) 22  Creatinine 0.61 - 1.24 mg/dL 0.24 0.97 3.53  Sodium 135 - 145 mmol/L 140 140 138  Potassium 3.5 - 5.1 mmol/L 3.2(L) 3.8 4.0  Chloride 98 - 111 mmol/L 101 102 105  CO2 22 - 32 mmol/L 26 25 23   Calcium 8.9 - 10.3 mg/dL 8.8(L) 8.8(L) 8.7(L)  Total Protein 6.5 - 8.1 g/dL - - -  Total Bilirubin 0.3 - 1.2 mg/dL - - -  Alkaline Phos 38 - 126 U/L - - -  AST 15 - 41 U/L - - -  ALT 0 - 44 U/L - - -   CBC Latest Ref Rng & Units 12/19/2018 12/18/2018 12/17/2018  WBC 4.0 - 10.5 K/uL 25.2(H) 26.6(H) 15.4(H)  Hemoglobin 13.0 - 17.0 g/dL 12.2(L) 11.8(L) 12.2(L)  Hematocrit 39.0 - 52.0 % 37.6(L) 37.0(L) 38.5(L)   Platelets 150 - 400 K/uL 441(H) 396 344   ABG    Component Value Date/Time   PHART 7.402 12/17/2018 0106   PCO2ART 31.2 (L) 12/17/2018 0106   PO2ART 72.0 (L) 12/17/2018 0106   HCO3 19.5 (L) 12/17/2018 0106   TCO2 20 (L) 12/17/2018 0106   ACIDBASEDEF 4.0 (H) 12/17/2018 0106   O2SAT 95.0 12/17/2018 0106   CBG (last 3)  Recent Labs    12/19/18 2124 12/20/18 0815 12/20/18 [place], MD [facility] Pulmonary/Critical Care 12/20/2018, 2:23 PM

## 2018-12-20 NOTE — Progress Notes (Signed)
Asked patient about using Bipap tonight.  Patient stated he has had a good day today, his O2 levels have been high.  He stated that he feels more at ease with it nearby, but he does not feel he needs to wear it tonight.  No distress noted, will continue to monitor.

## 2018-12-20 NOTE — Progress Notes (Signed)
PROGRESS NOTE    Molli PoseyBruce D Hoelzel Jr.  XBJ:[id number] DOB: 08/03/1940 DOA: 12/16/2018 PCP: Forrest Moronuehle, Stephen, MD    Brief Narrative:  Justin Bradfordis a 78 y.o.malewithhistory of recent non-ST elevation MI in April 2020 status post stent placement with systolic heart failure last EF measured last week was 35%, COPD, diabetes mellitus, hypertension presents to the ER because of worsening shortness of breath on exertion over the last 1 week, hypoxic, with elevated BNP, and elevated troponins. CT angio of the chest suspicous for patchy bilateral infiltrates concerning for infection vs edema.   His COVID inpatient test was negative.  He was started on [employer] spectrum IV antibiotics, and IV lasix for diuresis and IV steroids.  Repeat NOVEL Coronavirus test negative   Assessment & Plan:   Principal Problem:   Acute respiratory failure with hypoxia (HCC) Active Problems:   COPD (chronic obstructive pulmonary disease) (HCC)   Diabetes mellitus type 2 in obese (HCC)   Chronic systolic heart failure (HCC)   CAD in native artery   PAF (paroxysmal atrial fibrillation) (HCC)   CAP (community acquired pneumonia)   Acute respiratory failure with hypoxia Differential include acute systolic heart failure and community acquired pneumonia and COPD exacerbation.  Admitted to progressive unit.  Though covid 19 test is negative, in view of the bilateral patchy infiltrates a repeat send out test sent, which is negative. He remains afebrile, without productive cough.  Meanwhile started the patient on IV rocephin and zithromax, IV steroids and bronchodilators.   he was started IV lasix 40 mg BID, without significant diuresis.  Hence Cardiology increased the lasix to 80 mg BID.   strict intake and output.    Last night  pt had respiratory distress and was NRB with sats in low 80's and required the use of BIPAP for a few hours, he was transitioned to 12 lit HF [place] oxygen.  His CXR remains the same  with diffuse pathcy infiltrates, suspicious for edema/ atypical infection.  Respiratory panel sent and negative.  Pulmonary consulted and recommendations given.    CAD: Pt on aspirin , statin, coreg, and losartan. Continue the same. Cardiology restarted the Brillinta.  He denies any chest pain at this time.  Pt off Brillinta for unclear reasons.    Type 2 DM: CBG (last 3)  Recent Labs    12/19/18 1628 12/19/18 2124 12/20/18 0815  GLUCAP 191* 185* 177*   Resume SSI while inpatient.    Anxiety and depression: Resume home meds.    Paroxysmal atrial fibrillation  On amiodarone which has been stopped as pt is in NSR.       DVT prophylaxis: lovenox.   Code Status: full code.  Family Communication: none at bedside. Called his sister and updated.  Disposition Plan: pending clinical improvement.    Consultants:   Cardiology. Dr Tenny Crawoss  Pulmonology Dr Craige CottaSood  Procedures:  CT angiogram  of the chest.   Antimicrobials: Rocephin and zithromax.   Subjective: Still significantly sob on talking. On high flow oxygen.   Objective: Vitals:   12/19/18 2128 12/19/18 2257 12/20/18 0518 12/20/18 0818  BP:  115/64 108/69 115/66  Pulse: 81 81 76 80  Resp: (!) 37 (!) 29 19 (!) 23  Temp:  (!) 96.5 F (35.8 C) (!) 97.5 F (36.4 C) 98.3 F (36.8 C)  TempSrc:  Oral Oral Axillary  SpO2: 90% 92% 92% 90%  Weight:   120.6 kg   Height:        Intake/Output  Summary (Last 24 hours) at 12/20/2018 0904 Last data filed at 12/20/2018 0820 Gross per 24 hour  Intake 720 ml  Output 2650 ml  Net -1930 ml   Filed Weights   12/16/18 2307 12/18/18 0607 12/20/18 0518  Weight: 120 kg 129.4 kg 120.6 kg    Examination:  General exam: mil distress from sob, and is on 12 lit  HF of [place] oxygen Respiratory system: bilateral rales and  Wheezing posteriorly.  Cardiovascular system: S1 & S2 heard, RRR.  Gastrointestinal system: Abdomen is nondistended, soft and nontender. No organomegaly or  masses felt. Normal bowel sounds heard. Central nervous system: Alert and oriented.non focal. Extremities: Symmetric 5 x 5 power. Skin: No rashes, lesions or ulcers Psychiatry:  Mood & affect appropriate.     Data Reviewed: I have personally reviewed following labs and imaging studies  CBC: Recent Labs  Lab 12/16/18 2320 12/17/18 0106 12/17/18 0507 12/18/18 1026 12/19/18 0732  WBC 18.0*  --  15.4* 26.6* 25.2*  NEUTROABS 13.8*  --  14.4*  --   --   HGB 12.5* 12.9* 12.2* 11.8* 12.2*  HCT 38.9* 38.0* 38.5* 37.0* 37.6*  MCV 93.1  --  94.1 93.2 91.0  PLT 382  --  344 396 441*   Basic Metabolic Panel: Recent Labs  Lab 12/16/18 2320 12/17/18 0106 12/17/18 0507 12/18/18 1026 12/19/18 0732  NA 134* 134* 133* 138 140  K 4.3 4.2 3.9 4.0 3.8  CL 104  --  103 105 102  CO2 16*  --  19* 23 25  GLUCOSE 160*  --  267* 188* 179*  BUN 10  --  11 22 27*  CREATININE 0.97  --  1.15 1.14 1.17  CALCIUM 8.6*  --  8.5* 8.7* 8.8*  MG  --   --  2.2  --   --    GFR: Estimated Creatinine Clearance: 72 mL/min (by C-G formula based on SCr of 1.17 mg/dL). Liver Function Tests: Recent Labs  Lab 12/16/18 2320 12/17/18 0507  AST 19 19  ALT 14 15  ALKPHOS 88 85  BILITOT 1.4* 1.3*  PROT 6.9 6.5  ALBUMIN 3.3* 3.1*   No results for input(s): LIPASE, AMYLASE in the last 168 hours. No results for input(s): AMMONIA in the last 168 hours. Coagulation Profile: No results for input(s): INR, PROTIME in the last 168 hours. Cardiac Enzymes: Recent Labs  Lab 12/16/18 2320 12/17/18 0507 12/17/18 1056 12/17/18 1714  TROPONINI 0.06* 0.18* 0.18* 0.17*   BNP (last 3 results) No results for input(s): PROBNP in the last 8760 hours. HbA1C: No results for input(s): HGBA1C in the last 72 hours. CBG: Recent Labs  Lab 12/19/18 0811 12/19/18 1132 12/19/18 1628 12/19/18 2124 12/20/18 0815  GLUCAP 169* 215* 191* 185* 177*   Lipid Profile: No results for input(s): CHOL, HDL, LDLCALC, TRIG,  CHOLHDL, LDLDIRECT in the last 72 hours. Thyroid Function Tests: No results for input(s): TSH, T4TOTAL, FREET4, T3FREE, THYROIDAB in the last 72 hours. Anemia Panel: No results for input(s): VITAMINB12, FOLATE, FERRITIN, TIBC, IRON, RETICCTPCT in the last 72 hours. Sepsis Labs: Recent Labs  Lab 12/17/18 0024  LATICACIDVEN 1.4    Recent Results (from the past 240 hour(s))  SARS Coronavirus 2     Status: None   Collection Time: 12/16/18 11:00 PM  Result Value Ref Range Status   SARS Coronavirus 2 NOT DETECTED NOT DETECTED Final    Comment: (NOTE) SARS-CoV-2 target nucleic acids are NOT DETECTED. The SARS-CoV-2 RNA is generally detectable  in upper and lower respiratory specimens during the acute phase of infection.  Negative  results do not preclude SARS-CoV-2 infection, do not rule out co-infections with other pathogens, and should not be used as the sole basis for treatment or other patient management decisions.  Negative results must be combined with clinical observations, patient history, and epidemiological information. The expected result is Not Detected. Fact Sheet for Patients: http://www.biofiredefense.com/wp-content/uploads/2020/03/BIOFIRE-COVID -19-patients.pdf Fact Sheet for Healthcare Providers: http://www.biofiredefense.com/wp-content/uploads/2020/03/BIOFIRE-COVID -19-hcp.pdf This test is not yet approved or cleared by the [place] FDA and  has been authorized for detection and/or diagnosis of SARS-CoV-2 by FDA under an Emergency Use Authorization (EUA).  This EUA will remain in effec t (meaning this test can be used) for the duration of  the COVID-19 declaration under Section 564(b)(1) of the Act, 21 U.S.C. section 360bbb-3(b)(1), unless the authorization is terminated or revoked sooner. Performed at [facility], [address] [place], [place] [address]   Novel Coronavirus,NAA,(SEND-OUT TO REF LAB - TAT 24-48 hrs); Hosp Order     Status: None    Collection Time: 12/16/18 11:00 PM   Specimen: Nasopharyngeal Swab; Respiratory  Result Value Ref Range Status   SARS-CoV-2, NAA NOT DETECTED NOT DETECTED Final    Comment: (NOTE) This test was developed and its performance characteristics determined by [facility]. This test has not been FDA cleared or approved. This test has been authorized by FDA under an Emergency Use Authorization (EUA). This test is only authorized for the duration of time the declaration that circumstances exist justifying the authorization of the emergency use of in vitro diagnostic tests for detection of SARS-CoV-2 virus and/or diagnosis of COVID-19 infection under section 564(b)(1) of the Act, 21 U.S.C. 604VWU-9(W)(1360bbb-3(b)(1), unless the authorization is terminated or revoked sooner. When diagnostic testing is negative, the possibility of a false negative result should be considered in the context of a patient's recent exposures and the presence of clinical signs and symptoms consistent with COVID-19. An individual without symptoms of COVID-19 and who is not shedding SARS-CoV-2 virus would expect to have a negative (not detected) result in this assay. Performed  At: [facility] [address] [place], [place] [id number] Jolene SchimkeNagendra Sanjai MD AO:[phone]    Coronavirus Source NASOPHARYNGEAL  Final    Comment: Performed at [facility], [address] [place], [place] [address]  Blood culture (routine x 2)     Status: None (Preliminary result)   Collection Time: 12/17/18 12:25 AM   Specimen: BLOOD LEFT ARM  Result Value Ref Range Status   Specimen Description BLOOD LEFT ARM  Final   Special Requests   Final    BOTTLES DRAWN AEROBIC AND ANAEROBIC Blood Culture adequate volume   Culture   Final    NO GROWTH 2 DAYS Performed at [facility], [address] [place], [place] [address]    Report Status PENDING  Incomplete  Blood culture (routine x 2)     Status: None (Preliminary result)    Collection Time: 12/17/18  1:40 AM   Specimen: BLOOD LEFT ARM  Result Value Ref Range Status   Specimen Description BLOOD LEFT ARM  Final   Special Requests   Final    BOTTLES DRAWN AEROBIC AND ANAEROBIC Blood Culture adequate volume   Culture   Final    NO GROWTH 2 DAYS Performed at [facility], [address] [place], [place] [address]    Report Status PENDING  Incomplete  Respiratory Panel by  PCR     Status: None   Collection Time: 12/19/18  7:46 PM   Specimen: Nasopharyngeal Swab; Respiratory  Result Value Ref Range Status   Adenovirus NOT DETECTED NOT DETECTED Final   Coronavirus 229E NOT DETECTED NOT DETECTED Final    Comment: (NOTE) The Coronavirus on the Respiratory Panel, DOES NOT test for the novel  Coronavirus (2019 nCoV)    Coronavirus HKU1 NOT DETECTED NOT DETECTED Final   Coronavirus NL63 NOT DETECTED NOT DETECTED Final   Coronavirus OC43 NOT DETECTED NOT DETECTED Final   Metapneumovirus NOT DETECTED NOT DETECTED Final   Rhinovirus / Enterovirus NOT DETECTED NOT DETECTED Final   Influenza A NOT DETECTED NOT DETECTED Final   Influenza B NOT DETECTED NOT DETECTED Final   Parainfluenza Virus 1 NOT DETECTED NOT DETECTED Final   Parainfluenza Virus 2 NOT DETECTED NOT DETECTED Final   Parainfluenza Virus 3 NOT DETECTED NOT DETECTED Final   Parainfluenza Virus 4 NOT DETECTED NOT DETECTED Final   Respiratory Syncytial Virus NOT DETECTED NOT DETECTED Final   Bordetella pertussis NOT DETECTED NOT DETECTED Final   Chlamydophila pneumoniae NOT DETECTED NOT DETECTED Final   Mycoplasma pneumoniae NOT DETECTED NOT DETECTED Final    Comment: Performed at [facility], [address], [place], [place] [address]         Radiology Studies: Dg Chest Port 1 View  Result Date: 12/19/2018 CLINICAL DATA:  History of heart failure. EXAM: PORTABLE CHEST 1 VIEW COMPARISON:  12/18/2018.  12/16/2018. FINDINGS: Cardiomegaly. Diffuse bilateral pulmonary infiltrates/edema  again noted. No interim change. No pleural effusion or pneumothorax. Degenerative changes scoliosis thoracic spine. IMPRESSION: 1.  Cardiomegaly. 2. Diffuse bilateral pulmonary infiltrates/edema again noted. No interim change. Electronically Signed   By: Marcello Moores  Register   On: 12/19/2018 13:11   Dg Chest Port 1 View  Result Date: 12/18/2018 CLINICAL DATA:  Acute respiratory distress EXAM: PORTABLE CHEST 1 VIEW COMPARISON:  December 18, 2018 FINDINGS: Multifocal airspace opacities are again noted. The heart size is stable. There is no pneumothorax. No large pleural effusion. There is no acute osseous abnormality. Bilateral pleural effusions are again noted. IMPRESSION: Persistent unchanged multifocal airspace opacities which may represent pulmonary edema or an atypical infectious process. Electronically Signed   By: Constance Holster M.D.   On: 12/18/2018 20:15   Dg Chest Port 1 View  Result Date: 12/18/2018 CLINICAL DATA:  Shortness of breath for 1 week. EXAM: PORTABLE CHEST 1 VIEW COMPARISON:  12/16/2018 and prior radiographs FINDINGS: Increasing bilateral airspace opacities noted since 12/16/2018. The cardiomediastinal silhouette is unchanged. Equivocal small RIGHT pleural effusion noted. No pneumothorax or acute bony abnormality. IMPRESSION: Increasing bilateral airspace opacities which may represent edema and/or infection. Electronically Signed   By: Margarette Canada M.D.   On: 12/18/2018 13:09        Scheduled Meds: . aspirin EC  81 mg Oral Daily  . atorvastatin  80 mg Oral q1800  . azithromycin  500 mg Oral QHS  . carvedilol  3.125 mg Oral BID WC  . clonazePAM  1 mg Oral QHS  . enoxaparin (LOVENOX) injection  40 mg Subcutaneous Q24H  . FLUoxetine  60 mg Oral Daily  . furosemide  80 mg Intravenous Q12H  . insulin aspart  0-9 Units Subcutaneous TID WC  . lamoTRIgine  75 mg Oral QHS  . losartan  12.5 mg Oral Daily  . mouth rinse  15 mL Mouth Rinse BID  . methylPREDNISolone (SOLU-MEDROL)  injection  60 mg Intravenous  Q6H  . pantoprazole  40 mg Oral Daily  . potassium chloride SA  40 mEq Oral Daily  . spironolactone  12.5 mg Oral Daily  . tamsulosin  0.4 mg Oral QHS  . ticagrelor  90 mg Oral BID   Continuous Infusions: . sodium chloride 10 mL/hr at 12/17/18 1800  . cefTRIAXone (ROCEPHIN)  IV 1 g (12/19/18 1726)     LOS: 3 days    Time spent: 38 minutes     Kathlen Mody, MD [facility] Pager [phone]   If 7PM-7AM, please contact night-coverage [email or web address] Password [email or web address] 12/20/2018, 9:04 AM

## 2018-12-20 NOTE — Progress Notes (Signed)
Progress Note  Patient Name: Justin PoseyBruce D Kelley Jr. Date of Encounter: 12/20/2018  Primary Cardiologist:   Norman HerrlichBrian Munley, MD   Subjective   Feels much better today.  Slept well.  Denies pain.   Inpatient Medications    Scheduled Meds: . aspirin EC  81 mg Oral Daily  . atorvastatin  80 mg Oral q1800  . azithromycin  500 mg Oral QHS  . carvedilol  3.125 mg Oral BID WC  . clonazePAM  1 mg Oral QHS  . enoxaparin (LOVENOX) injection  40 mg Subcutaneous Q24H  . FLUoxetine  60 mg Oral Daily  . furosemide  80 mg Intravenous Q12H  . insulin aspart  0-9 Units Subcutaneous TID WC  . lamoTRIgine  75 mg Oral QHS  . losartan  12.5 mg Oral Daily  . mouth rinse  15 mL Mouth Rinse BID  . methylPREDNISolone (SOLU-MEDROL) injection  60 mg Intravenous Q6H  . pantoprazole  40 mg Oral Daily  . potassium chloride SA  40 mEq Oral Daily  . spironolactone  12.5 mg Oral Daily  . tamsulosin  0.4 mg Oral QHS  . ticagrelor  90 mg Oral BID   Continuous Infusions: . sodium chloride 10 mL/hr at 12/17/18 1800  . cefTRIAXone (ROCEPHIN)  IV 1 g (12/19/18 1726)   PRN Meds: sodium chloride, acetaminophen **OR** acetaminophen, albuterol, ondansetron **OR** ondansetron (ZOFRAN) IV   Vital Signs    Vitals:   12/19/18 2257 12/20/18 0518 12/20/18 0818 12/20/18 1155  BP: 115/64 108/69 115/66 105/76  Pulse: 81 76 80 77  Resp: (!) 29 19 (!) 23 20  Temp: (!) 96.5 F (35.8 C) (!) 97.5 F (36.4 C) 98.3 F (36.8 C) 97.6 F (36.4 C)  TempSrc: Oral Oral Axillary Oral  SpO2: 92% 92% 90% 95%  Weight:  120.6 kg    Height:        Intake/Output Summary (Last 24 hours) at 12/20/2018 1322 Last data filed at 12/20/2018 1159 Gross per 24 hour  Intake 720 ml  Output 2400 ml  Net -1680 ml   Filed Weights   12/16/18 2307 12/18/18 0607 12/20/18 0518  Weight: 120 kg 129.4 kg 120.6 kg    Telemetry    NSR - Personally Reviewed  ECG    NA - Personally Reviewed  Physical Exam   GEN: No acute distress.    Neck: No  JVD Cardiac: RRR, no murmurs, rubs, or gallops.  Respiratory:    Decreased breath sounds GI: Soft, nontender, non-distended  MS: No  edema; No deformity. Neuro:  Nonfocal  Psych: Normal affect   Labs    Chemistry Recent Labs  Lab 12/16/18 2320  12/17/18 0507 12/18/18 1026 12/19/18 0732 12/20/18 0828  NA 134*   < > 133* 138 140 140  K 4.3   < > 3.9 4.0 3.8 3.2*  CL 104  --  103 105 102 101  CO2 16*  --  19* 23 25 26   GLUCOSE 160*  --  267* 188* 179* 203*  BUN 10  --  11 22 27* 30*  CREATININE 0.97  --  1.15 1.14 1.17 1.08  CALCIUM 8.6*  --  8.5* 8.7* 8.8* 8.8*  PROT 6.9  --  6.5  --   --   --   ALBUMIN 3.3*  --  3.1*  --   --   --   AST 19  --  19  --   --   --   ALT 14  --  15  --   --   --   ALKPHOS 88  --  85  --   --   --   BILITOT 1.4*  --  1.3*  --   --   --   GFRNONAA >60  --  >60 >60 >60 >60  GFRAA >60  --  >60 >60 >60 >60  ANIONGAP 14  --  11 10 13 13    < > = values in this interval not displayed.     Hematology Recent Labs  Lab 12/17/18 0507 12/18/18 1026 12/19/18 0732  WBC 15.4* 26.6* 25.2*  RBC 4.09* 3.97* 4.13*  HGB 12.2* 11.8* 12.2*  HCT 38.5* 37.0* 37.6*  MCV 94.1 93.2 91.0  MCH 29.8 29.7 29.5  MCHC 31.7 31.9 32.4  RDW 14.7 14.7 14.8  PLT 344 396 441*    Cardiac Enzymes Recent Labs  Lab 12/16/18 2320 12/17/18 0507 12/17/18 1056 12/17/18 1714  TROPONINI 0.06* 0.18* 0.18* 0.17*   No results for input(s): TROPIPOC in the last 168 hours.   BNP Recent Labs  Lab 12/16/18 2321  BNP 916.8*     DDimer No results for input(s): DDIMER in the last 168 hours.   Radiology    Dg Chest Port 1 View  Result Date: 12/19/2018 CLINICAL DATA:  History of heart failure. EXAM: PORTABLE CHEST 1 VIEW COMPARISON:  12/18/2018.  12/16/2018. FINDINGS: Cardiomegaly. Diffuse bilateral pulmonary infiltrates/edema again noted. No interim change. No pleural effusion or pneumothorax. Degenerative changes scoliosis thoracic spine. IMPRESSION: 1.   Cardiomegaly. 2. Diffuse bilateral pulmonary infiltrates/edema again noted. No interim change. Electronically Signed   By: Maisie Fus  Register   On: 12/19/2018 13:11   Dg Chest Port 1 View  Result Date: 12/18/2018 CLINICAL DATA:  Acute respiratory distress EXAM: PORTABLE CHEST 1 VIEW COMPARISON:  December 18, 2018 FINDINGS: Multifocal airspace opacities are again noted. The heart size is stable. There is no pneumothorax. No large pleural effusion. There is no acute osseous abnormality. Bilateral pleural effusions are again noted. IMPRESSION: Persistent unchanged multifocal airspace opacities which may represent pulmonary edema or an atypical infectious process. Electronically Signed   By: Katherine Mantle M.D.   On: 12/18/2018 20:15    Cardiac Studies   Echocardiogram 10/2018:  1. The left ventricle has moderate-severely reduced systolic function, with an ejection fraction of 30-35%. The cavity size was normal. Left ventricular diastolic function could not be evaluated due to nondiagnostic images. 2. No evidence of LV thrombus by definity contrast. 3. The right ventricle has normal systolc function. The cavity was normal. There is no increase in right ventricular wall thickness. Right ventricular systolic pressure could not be assessed. 4. The aortic valve is tricuspid Moderate sclerosis of the aortic valve.  Patient Profile     78 y.o. 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 Blake Divine.  Assessment & Plan    ACUTE ON CHRONIC SYSTOLIC HF:    Negative 5 liters this admission.  Given potassium supplementation this morning.   I would continue IV Lasix today.   CAD:  We do not suspect an acute problem with his recent stent.  Medical management.  HTN:  BP is well controlled.   PAF:  Amiodarone discontinued this admission and no  evidence of recurrent fib.  No change in therapy.     For  questions or updates, please contact [place] Please consult [email or web address] for contact info under Cardiology/STEMI.   Signed, Minus Breeding, MD  12/20/2018, 1:22 PM

## 2018-12-21 ENCOUNTER — Inpatient Hospital Stay (HOSPITAL_COMMUNITY): Payer: Medicare Other

## 2018-12-21 LAB — BASIC METABOLIC PANEL
Anion gap: 12 (ref 5–15)
BUN: 35 mg/dL — ABNORMAL HIGH (ref 8–23)
CO2: 28 mmol/L (ref 22–32)
Calcium: 8.8 mg/dL — ABNORMAL LOW (ref 8.9–10.3)
Chloride: 100 mmol/L (ref 98–111)
Creatinine, Ser: 1.09 mg/dL (ref 0.61–1.24)
GFR calc Af Amer: >60 mL/min (ref >60)
GFR calc non Af Amer: >60 mL/min (ref >60)
Glucose, Bld: 221 mg/dL — ABNORMAL HIGH (ref 70–99)
Potassium: 3.2 mmol/L — ABNORMAL LOW (ref 3.5–5.1)
Sodium: 140 mmol/L (ref 135–145)

## 2018-12-21 LAB — LEGIONELLA PNEUMOPHILA SEROGP 1 UR AG: L. pneumophila Serogp 1 Ur Ag: NEGATIVE

## 2018-12-21 LAB — CBC
HCT: 40.9 % (ref 39.0–52.0)
Hemoglobin: 13 g/dL (ref 13.0–17.0)
MCH: 29.1 pg (ref 26.0–34.0)
MCHC: 31.8 g/dL (ref 30.0–36.0)
MCV: 91.7 fL (ref 80.0–100.0)
Platelets: 461 10*3/uL — ABNORMAL HIGH (ref 150–400)
RBC: 4.46 MIL/uL (ref 4.22–5.81)
RDW: 14.5 % (ref 11.5–15.5)
WBC: 17.2 10*3/uL — ABNORMAL HIGH (ref 4.0–10.5)
nRBC: 0 % (ref 0.0–0.2)

## 2018-12-21 LAB — MAGNESIUM: Magnesium: 2.2 mg/dL (ref 1.7–2.4)

## 2018-12-21 LAB — GLUCOSE, CAPILLARY
Glucose-Capillary: 204 mg/dL — ABNORMAL HIGH (ref 70–99)
Glucose-Capillary: 215 mg/dL — ABNORMAL HIGH (ref 70–99)
Glucose-Capillary: 212 mg/dL — ABNORMAL HIGH (ref 70–99)
Glucose-Capillary: 258 mg/dL — ABNORMAL HIGH (ref 70–99)

## 2018-12-21 MED ORDER — METHYLPREDNISOLONE SODIUM SUCC 40 MG IJ SOLR
40.0000 mg | Freq: Three times a day (TID) | INTRAMUSCULAR | Status: DC
  Administered 2018-12-21 – 2018-12-22 (×3): 40 mg via INTRAVENOUS
  Filled 2018-12-21 (×3): qty 1

## 2018-12-21 MED ORDER — POTASSIUM CHLORIDE CRYS ER 20 MEQ PO TBCR
40.0000 meq | EXTENDED_RELEASE_TABLET | Freq: Once | ORAL | Status: AC
  Administered 2018-12-21: 40 meq via ORAL
  Filled 2018-12-21: qty 2

## 2018-12-21 NOTE — Progress Notes (Signed)
PROGRESS NOTE    Molli PoseyBruce D Desrosier Jr.  ZOX:[id number] DOB: 06/29/1941 DOA: 12/16/2018 PCP: Forrest Moronuehle, Stephen, MD    Brief Narrative:  Dellis AnesBruce D Philbin Jr.is a 78 y.o.malewithhistory of recent non-ST elevation MI in April 2020 status post stent placement with systolic heart failure last EF measured last week was 35%, COPD, diabetes mellitus, hypertension presents to the ER because of worsening shortness of breath on exertion over the last 1 week, hypoxic, with elevated BNP, and elevated troponins. CT angio of the chest suspicous for patchy bilateral infiltrates concerning for infection vs edema.   His COVID inpatient test was negative.  He was started on Broad spectrum IV antibiotics, and IV lasix for diuresis and IV steroids.  Repeat NOVEL Coronavirus test negative   Assessment & Plan:   Principal Problem:   Acute respiratory failure with hypoxia (HCC) Active Problems:   COPD (chronic obstructive pulmonary disease) (HCC)   Diabetes mellitus type 2 in obese (HCC)   Chronic systolic heart failure (HCC)   CAD in native artery   PAF (paroxysmal atrial fibrillation) (HCC)   CAP (community acquired pneumonia)   Acute respiratory failure with hypoxia Differential include acute systolic heart failure and community acquired pneumonia and COPD exacerbation.  Admitted to progressive unit.  Though covid 19 test is negative, in view of the bilateral patchy infiltrates a repeat send out test sent, which is negative. He remains afebrile, without productive cough.  Meanwhile started the patient on IV rocephin and zithromax, IV steroids and bronchodilators. Day 5 of antibiotics. Taper steroids as per PCCM.   he was started IV lasix 40 mg BID, without significant diuresis.  Hence Cardiology increased the lasix to 80 mg BID. diuresed about 6.6 liters since admission.   strict intake and output.    6/19 pt had respiratory distress and was NRB with sats in low 80's and required the use of BIPAP for a  few hours, he was transitioned to 12 lit HF [place] oxygen.  Weaning his oxygen. Repeat CXR shows improvement in the the bilateral infiltrates.  Respiratory panel sent and negative.  Pulmonary consulted and recommendations given.    CAD: Pt on aspirin , statin, coreg, and losartan. Continue the same. Cardiology restarted the Brillinta.  He denies any chest pain at this time.  Pt off Brillinta for unclear reasons.    Type 2 DM: CBG (last 3)  Recent Labs    12/20/18 1607 12/20/18 2133 12/21/18 0749  GLUCAP 160* 304* 204*   Resume SSI while inpatient.    Anxiety and depression: Resume home meds.    Paroxysmal atrial fibrillation  On amiodarone which has been stopped as pt is in NSR.       DVT prophylaxis: lovenox.   Code Status: full code.  Family Communication: none at bedside. Called his sister and updated.  Disposition Plan: pending clinical improvement.    Consultants:   Cardiology. Dr Tenny Crawoss  Pulmonology Dr Craige CottaSood  Procedures:  CT angiogram  of the chest.   Antimicrobials: Rocephin and zithromax.   Subjective: Sob improved , no chest pain .   Objective: Vitals:   12/21/18 0400 12/21/18 0453 12/21/18 0600 12/21/18 0756  BP: 111/64   123/75  Pulse: 72   74  Resp: 19   16  Temp: 98.1 F (36.7 C)   97.8 F (36.6 C)  TempSrc: Oral   Axillary  SpO2: 95% 94%  94%  Weight:   115.5 kg   Height:  Intake/Output Summary (Last 24 hours) at 12/21/2018 1110 Last data filed at 12/21/2018 0757 Gross per 24 hour  Intake 480 ml  Output 2210 ml  Net -1730 ml   Filed Weights   12/18/18 0607 12/20/18 0518 12/21/18 0600  Weight: 129.4 kg 120.6 kg 115.5 kg    Examination:  General exam: no distress noted and is on 9 lit  HF of [place] oxygen Respiratory system: bilateral rales and  Wheezing posteriorly.  Cardiovascular system: S1 & S2 heard, RRR.  Gastrointestinal system: Abdomen is nondistended, soft and nontender. No organomegaly or masses felt. Normal bowel  sounds heard. Central nervous system: Alert and oriented.non focal. Extremities: Symmetric 5 x 5 power. Skin: No rashes, lesions or ulcers Psychiatry:  Mood & affect appropriate.     Data Reviewed: I have personally reviewed following labs and imaging studies  CBC: Recent Labs  Lab 12/16/18 2320 12/17/18 0106 12/17/18 0507 12/18/18 1026 12/19/18 0732 12/21/18 0804  WBC 18.0*  --  15.4* 26.6* 25.2* 17.2*  NEUTROABS 13.8*  --  14.4*  --   --   --   HGB 12.5* 12.9* 12.2* 11.8* 12.2* 13.0  HCT 38.9* 38.0* 38.5* 37.0* 37.6* 40.9  MCV 93.1  --  94.1 93.2 91.0 91.7  PLT 382  --  344 396 441* 461*   Basic Metabolic Panel: Recent Labs  Lab 12/17/18 0507 12/18/18 1026 12/19/18 0732 12/20/18 0828 12/21/18 0804  NA 133* 138 140 140 140  K 3.9 4.0 3.8 3.2* 3.2*  CL 103 105 102 101 100  CO2 19* 23 25 26 28   GLUCOSE 267* 188* 179* 203* 221*  BUN 11 22 27* 30* 35*  CREATININE 1.15 1.14 1.17 1.08 1.09  CALCIUM 8.5* 8.7* 8.8* 8.8* 8.8*  MG 2.2  --   --   --  2.2   GFR: Estimated Creatinine Clearance: 75.7 mL/min (by C-G formula based on SCr of 1.09 mg/dL). Liver Function Tests: Recent Labs  Lab 12/16/18 2320 12/17/18 0507  AST 19 19  ALT 14 15  ALKPHOS 88 85  BILITOT 1.4* 1.3*  PROT 6.9 6.5  ALBUMIN 3.3* 3.1*   No results for input(s): LIPASE, AMYLASE in the last 168 hours. No results for input(s): AMMONIA in the last 168 hours. Coagulation Profile: No results for input(s): INR, PROTIME in the last 168 hours. Cardiac Enzymes: Recent Labs  Lab 12/16/18 2320 12/17/18 0507 12/17/18 1056 12/17/18 1714  TROPONINI 0.06* 0.18* 0.18* 0.17*   BNP (last 3 results) No results for input(s): PROBNP in the last 8760 hours. HbA1C: No results for input(s): HGBA1C in the last 72 hours. CBG: Recent Labs  Lab 12/20/18 0815 12/20/18 1157 12/20/18 1607 12/20/18 2133 12/21/18 0749  GLUCAP 177* 200* 160* 304* 204*   Lipid Profile: No results for input(s): CHOL, HDL,  LDLCALC, TRIG, CHOLHDL, LDLDIRECT in the last 72 hours. Thyroid Function Tests: No results for input(s): TSH, T4TOTAL, FREET4, T3FREE, THYROIDAB in the last 72 hours. Anemia Panel: No results for input(s): VITAMINB12, FOLATE, FERRITIN, TIBC, IRON, RETICCTPCT in the last 72 hours. Sepsis Labs: Recent Labs  Lab 12/17/18 0024  LATICACIDVEN 1.4    Recent Results (from the past 240 hour(s))  SARS Coronavirus 2     Status: None   Collection Time: 12/16/18 11:00 PM  Result Value Ref Range Status   SARS Coronavirus 2 NOT DETECTED NOT DETECTED Final    Comment: (NOTE) SARS-CoV-2 target nucleic acids are NOT DETECTED. The SARS-CoV-2 RNA is generally detectable in upper and lower  respiratory specimens during the acute phase of infection.  Negative  results do not preclude SARS-CoV-2 infection, do not rule out co-infections with other pathogens, and should not be used as the sole basis for treatment or other patient management decisions.  Negative results must be combined with clinical observations, patient history, and epidemiological information. The expected result is Not Detected. Fact Sheet for Patients: http://www.biofiredefense.com/wp-content/uploads/2020/03/BIOFIRE-COVID -19-patients.pdf Fact Sheet for Healthcare Providers: http://www.biofiredefense.com/wp-content/uploads/2020/03/BIOFIRE-COVID -19-hcp.pdf This test is not yet approved or cleared by the [place] and  has been authorized for detection and/or diagnosis of SARS-CoV-2 by FDA under an Emergency Use Authorization (EUA).  This EUA will remain in effec t (meaning this test can be used) for the duration of  the COVID-19 declaration under Section 564(b)(1) of the Act, 21 U.S.C. section 360bbb-3(b)(1), unless the authorization is terminated or revoked sooner. Performed at [facility], [address], [place], [place] [address]   Novel Coronavirus,NAA,(SEND-OUT TO REF LAB - TAT 24-48 hrs); Hosp Order      Status: None   Collection Time: 12/16/18 11:00 PM   Specimen: Nasopharyngeal Swab; Respiratory  Result Value Ref Range Status   SARS-CoV-2, NAA NOT DETECTED NOT DETECTED Final    Comment: (NOTE) This test was developed and its performance characteristics determined by [employer]. This test has not been FDA cleared or approved. This test has been authorized by FDA under an Emergency Use Authorization (EUA). This test is only authorized for the duration of time the declaration that circumstances exist justifying the authorization of the emergency use of in vitro diagnostic tests for detection of SARS-CoV-2 virus and/or diagnosis of COVID-19 infection under section 564(b)(1) of the Act, 21 U.S.C. 758ITG-5(Q)(9), unless the authorization is terminated or revoked sooner. When diagnostic testing is negative, the possibility of a false negative result should be considered in the context of a patient's recent exposures and the presence of clinical signs and symptoms consistent with COVID-19. An individual without symptoms of COVID-19 and who is not shedding SARS-CoV-2 virus would expect to have a negative (not detected) result in this assay. Performed  At: [facility] [address] [place], [place] [id number] Jolene Schimke MD NM:[id number]    Coronavirus Source NASOPHARYNGEAL  Final    Comment: Performed at [facility], [address], [place], [place] [address]  Blood culture (routine x 2)     Status: None (Preliminary result)   Collection Time: 12/17/18 12:25 AM   Specimen: BLOOD LEFT ARM  Result Value Ref Range Status   Specimen Description BLOOD LEFT ARM  Final   Special Requests   Final    BOTTLES DRAWN AEROBIC AND ANAEROBIC Blood Culture adequate volume   Culture   Final    NO GROWTH 3 DAYS Performed at [facility], [address], [place], [place] [address]    Report Status PENDING  Incomplete  Blood culture (routine x 2)     Status: None  (Preliminary result)   Collection Time: 12/17/18  1:40 AM   Specimen: BLOOD LEFT ARM  Result Value Ref Range Status   Specimen Description BLOOD LEFT ARM  Final   Special Requests   Final    BOTTLES DRAWN AEROBIC AND ANAEROBIC Blood Culture adequate volume   Culture   Final    NO GROWTH 3 DAYS Performed at [facility], [address], [place], [place] [address]    Report Status PENDING  Incomplete  Respiratory Panel by PCR  Status: None   Collection Time: 12/19/18  7:46 PM   Specimen: Nasopharyngeal Swab; Respiratory  Result Value Ref Range Status   Adenovirus NOT DETECTED NOT DETECTED Final   Coronavirus 229E NOT DETECTED NOT DETECTED Final    Comment: (NOTE) The Coronavirus on the Respiratory Panel, DOES NOT test for the novel  Coronavirus (2019 nCoV)    Coronavirus HKU1 NOT DETECTED NOT DETECTED Final   Coronavirus NL63 NOT DETECTED NOT DETECTED Final   Coronavirus OC43 NOT DETECTED NOT DETECTED Final   Metapneumovirus NOT DETECTED NOT DETECTED Final   Rhinovirus / Enterovirus NOT DETECTED NOT DETECTED Final   Influenza A NOT DETECTED NOT DETECTED Final   Influenza B NOT DETECTED NOT DETECTED Final   Parainfluenza Virus 1 NOT DETECTED NOT DETECTED Final   Parainfluenza Virus 2 NOT DETECTED NOT DETECTED Final   Parainfluenza Virus 3 NOT DETECTED NOT DETECTED Final   Parainfluenza Virus 4 NOT DETECTED NOT DETECTED Final   Respiratory Syncytial Virus NOT DETECTED NOT DETECTED Final   Bordetella pertussis NOT DETECTED NOT DETECTED Final   Chlamydophila pneumoniae NOT DETECTED NOT DETECTED Final   Mycoplasma pneumoniae NOT DETECTED NOT DETECTED Final    Comment: Performed at [facility], [address] [place], [place] [id number]         Radiology Studies: Dg Chest Port 1 View  Result Date: 12/21/2018 CLINICAL DATA:  Pulmonary opacities EXAM: PORTABLE CHEST 1 VIEW COMPARISON:  12/19/2018 chest radiograph. FINDINGS: Stable cardiomediastinal silhouette with  mild cardiomegaly. No pneumothorax. No pleural effusion. Patchy opacities throughout both lungs have mildly improved. IMPRESSION: Cardiomegaly with mildly improved patchy opacities throughout both lungs, which could represent pulmonary edema or infection. Electronically Signed   By: Delbert PhenixJason A Poff M.D.   On: 12/21/2018 10:03   Dg Chest Port 1 View  Result Date: 12/19/2018 CLINICAL DATA:  History of heart failure. EXAM: PORTABLE CHEST 1 VIEW COMPARISON:  12/18/2018.  12/16/2018. FINDINGS: Cardiomegaly. Diffuse bilateral pulmonary infiltrates/edema again noted. No interim change. No pleural effusion or pneumothorax. Degenerative changes scoliosis thoracic spine. IMPRESSION: 1.  Cardiomegaly. 2. Diffuse bilateral pulmonary infiltrates/edema again noted. No interim change. Electronically Signed   By: Maisie Fushomas  Register   On: 12/19/2018 13:11        Scheduled Meds: . aspirin EC  81 mg Oral Daily  . atorvastatin  80 mg Oral q1800  . azithromycin  500 mg Oral QHS  . carvedilol  3.125 mg Oral BID WC  . clonazePAM  1 mg Oral QHS  . enoxaparin (LOVENOX) injection  40 mg Subcutaneous Q24H  . FLUoxetine  60 mg Oral Daily  . furosemide  80 mg Intravenous Q12H  . insulin aspart  0-9 Units Subcutaneous TID WC  . lamoTRIgine  75 mg Oral QHS  . losartan  12.5 mg Oral Daily  . mouth rinse  15 mL Mouth Rinse BID  . methylPREDNISolone (SOLU-MEDROL) injection  60 mg Intravenous Q6H  . pantoprazole  40 mg Oral Daily  . potassium chloride SA  40 mEq Oral Daily  . potassium chloride  40 mEq Oral Once  . spironolactone  12.5 mg Oral Daily  . tamsulosin  0.4 mg Oral QHS  . ticagrelor  90 mg Oral BID   Continuous Infusions: . sodium chloride 10 mL/hr at 12/17/18 1800  . cefTRIAXone (ROCEPHIN)  IV 1 g (12/20/18 1728)     LOS: 4 days    Time spent: 34 minutes     Kathlen ModyVijaya Daira Hine, MD [facility] Pager [phone]  If 7PM-7AM, please contact night-coverage [email or web address] Password [email or web address] 12/21/2018,  11:10 AM

## 2018-12-21 NOTE — Evaluation (Signed)
Occupational Therapy Evaluation Patient Details Name: Justin PoseyBruce D Dorer Jr. MRN: [id number] DOB: 02/22/1941 Today's Date: 12/21/2018    History of Present Illness 78 y.o. male with history of recent non-ST elevation MI in April 2020 status post stent placement with systolic heart failure last EF measured last week was 35%, COPD, diabetes mellitus, hypertension presents to the ER because of worsening shortness of breath on exertion over the last 1 week, hypoxic, with elevated BNP, and elevated troponins. CT angio of the chest suspicous for patchy bilateral infiltrates concerning for infection vs edema.   Clinical Impression   Pt admitted with the above diagnoses and presents with below problem list. Pt will benefit from continued acute OT to address the below listed deficits and maximize independence with basic ADLs prior to d/c home. PTA pt was independent to mod I (occasional use of rollator). Pt is from home alone. Pt with significant drop in O2 sats, from 91 to 80, while standing at sink to complete oral care. Pt reported feeling fine but still cued to sit to try to bring O2 sat up. Recovered in about a minute with basic breathing exercises and seated rest break. Pt reports he has ordered a pulse ox to monitor O2 at home.      Follow Up Recommendations  Home health OT;Supervision - Intermittent    Equipment Recommendations  None recommended by OT    Recommendations for Other Services       Precautions / Restrictions Restrictions Weight Bearing Restrictions: No Other Position/Activity Restrictions: watch O2      Mobility Bed Mobility Overal bed mobility: Needs Assistance Bed Mobility: Supine to Sit     Supine to sit: Min guard;HOB elevated;Min assist     General bed mobility comments: Pt reaching for therapist arm to pull up on to power up trunk to EOB position.   Transfers Overall transfer level: Needs assistance Equipment used: Rolling walker (2 wheeled) Transfers: Sit  to/from Stand Sit to Stand: Min guard         General transfer comment: to/from EOB and recliner. Min guard for safety.    Balance Overall balance assessment: Needs assistance Sitting-balance support: No upper extremity supported;Feet supported Sitting balance-[name]: Fair     Standing balance support: No upper extremity supported;During functional activity Standing balance-[name]: Fair Standing balance comment: stood at sink to complete most of oral care task. Intermittently reaching for sink to steady,                            ADL either performed or assessed with clinical judgement   ADL Overall ADL's : Needs assistance/impaired Eating/Feeding: Set up;Sitting   Grooming: Oral care;Min guard;Standing;Sitting Grooming Details (indicate cue type and reason): O2 dropped from 91 to 80 in standing. Pt reports feeling fine. Cued to sit down to bring O2 level up. O2 recovered with seated rest break and basic breathing exercises.  Upper Body Bathing: Set up;Sitting   Lower Body Bathing: Min guard;Sit to/from stand   Upper Body Dressing : Set up;Sitting   Lower Body Dressing: Min guard;Sit to/from stand   Toilet Transfer: Min guard;Ambulation   Toileting- Clothing Manipulation and Hygiene: Min guard;Sit to/from stand   Tub/ Shower Transfer: Tub transfer;Min guard;Ambulation;Tub bench;Rolling walker   Functional mobility during ADLs: Min guard;Rolling walker General ADL Comments: Pt walked to sink and completed grooming task as detailed above. Discussed ways to self monitor O2 levels during activity at home. Pt reports  he has ordered a pulse ox.     Vision         Perception     Praxis      Pertinent Vitals/Pain Pain Assessment: No/denies pain     Hand Dominance Right   Extremity/Trunk Assessment Upper Extremity Assessment Upper Extremity Assessment: Generalized weakness   Lower Extremity Assessment Lower Extremity Assessment: Defer to PT  evaluation       Communication Communication Communication: No difficulties;Other (comment)(hyperverbal, HOH? vs attention level)   Cognition Arousal/Alertness: Awake/alert Behavior During Therapy: WFL for tasks assessed/performed;Flat affect(hyperverbal) Overall Cognitive Status: No family/caregiver present to determine baseline cognitive functioning                                 General Comments: HOH vs attention deficits?   General Comments       Exercises     Shoulder Instructions      Home Living Family/patient expects to be discharged to:: Private residence Living Arrangements: Alone Available Help at Discharge: Family;Available PRN/intermittently Type of Home: House Home Access: Ramped entrance     Home Layout: One level     Bathroom Shower/Tub: Curtain;Tub/shower unit   Biochemist, clinical: Standard Bathroom Accessibility: Yes How Accessible: Accessible via walker Home Equipment: Toilet riser;Grab bars - tub/shower;Hand held shower head;Tub bench          Prior Functioning/Environment Level of Independence: Independent        Comments: 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 Pt occasionally uses rollator. Does not drive. Uses scooter when he goes to the store (been awhile).         OT Problem List: Decreased activity tolerance;Decreased strength;Impaired balance (sitting and/or standing);Decreased knowledge of use of DME or AE;Decreased knowledge of precautions;Cardiopulmonary status limiting activity      OT Treatment/Interventions: Self-care/ADL training;Therapeutic exercise;Energy conservation;DME and/or AE instruction;Therapeutic activities;Balance training;Patient/family education    OT Goals(Current goals can be found in the care plan section) Acute Rehab OT Goals Patient Stated Goal: keep feeling better. "I'd love to be able to volunteer again." OT Goal Formulation: With patient Time For Goal Achievement:  01/04/19 Potential to Achieve Goals: Good ADL Goals Pt Will Perform Grooming: with set-up;sitting;standing Pt Will Perform Lower Body Bathing: with modified independence;sit to/from stand;sitting/lateral leans Pt Will Perform Lower Body Dressing: with set-up;sit to/from stand;sitting/lateral leans Pt Will Transfer to Toilet: with modified independence;ambulating Pt Will Perform Toileting - Clothing Manipulation and hygiene: with modified independence;sit to/from stand Pt Will Perform Tub/Shower Transfer: Tub transfer;with modified independence;ambulating;tub bench;rolling walker  OT Frequency: Min 2X/week   Barriers to D/C: Decreased caregiver support  lives alone.        Co-evaluation PT/OT/SLP Co-Evaluation/Treatment: Yes Reason for Co-Treatment: To address functional/ADL transfers;For patient/therapist safety   OT goals addressed during session: ADL's and self-care;Strengthening/ROM      AM-PAC OT "6 Clicks" Daily Activity     Outcome Measure Help from another person eating meals?: None Help from another person taking care of personal grooming?: None Help from another person toileting, which includes using toliet, bedpan, or urinal?: None Help from another person bathing (including washing, rinsing, drying)?: A Little Help from another person to put on and taking off regular upper body clothing?: None Help from another person to put on and taking off regular lower body clothing?: None 6 Click Score: 23   End of Session Equipment Utilized During Treatment: Oxygen;Rolling walker;Gait belt Nurse Communication: Other (  comment)(pill found in bed and placed on sink; O2 drop with activity)  Activity Tolerance: Other (comment)(O2 sats dropped with grooming in standing) Patient left: in chair;with call bell/phone within reach  OT Visit Diagnosis: Unsteadiness on feet (R26.81);Muscle weakness (generalized) (M62.81)                Time: 1135-1200 OT Time Calculation (min): 25  min Charges:  OT General Charges $OT Visit: 1 Visit OT Evaluation $OT Eval Low Complexity: 1 Low  Raynald Kemp, OT Acute Rehabilitation Services Pager: [phone] Office: [phone]   Pilar Grammes 12/21/2018, 12:21 PM

## 2018-12-21 NOTE — Progress Notes (Signed)
NAME:  Justin Vigen., MRN:  [id number], DOB:  02/11/1941, LOS: 4 ADMISSION DATE:  12/16/2018, CONSULTATION DATE:  12/19/2018 REFERRING MD:  Dr. Karleen Hampshire, [facility], CHIEF COMPLAINT:  Short of breath   Brief History   78 yo male former smoker with progressive dyspnea, hypoxia and pulmonary infiltrates after having DES in April 4098 with systolic CHF.  Past Medical History  CAD, HTN, Systolic CHF, Neuropathy, OA  Significant Hospital Events   6/16 Admit 6/19 Start steroids; speech swallow assessment >> no issues  Consults:  Cardiology 6/18 CHF, CAD, PAF  Procedures:    Significant Diagnostic Tests:  PFT 09/18/18 >> FEV1 2.30 (72%), FEV1% 73%, TLC 8.44 (113%), DLCO 101% CT angio chest 10/15/18 >> scattered GGO CT chest 10/23/18 >> small effusions, increased opacities in upper lobes  Echo 12/10/18 >> EF 35%, mod LVH, ascending aorta 40 mm CT angio chest 12/17/18 >> diffuse b/l ASD, moderate b/l effusions  Micro Data:  COVID 6/16 >> negative Send out COVID 6/16 >> negative Blood 6/17 >>  RVP 6/19 >> negative Pneumococcal Ag 6/19 >> negative Legionella Ag 6/19 >>  Antimicrobials:  Rocephin 6/17 >> Zithromax 6/17 >>   Interim history/subjective:  Feels better.  Decreased O2 needs.  Objective   Blood pressure 123/75, pulse 74, temperature 97.8 F (36.6 C), temperature source Axillary, resp. rate 16, height 6' (1.829 m), weight 115.5 kg, SpO2 94 %.        Intake/Output Summary (Last 24 hours) at 12/21/2018 1141 Last data filed at 12/21/2018 0757 Gross per 24 hour  Intake 480 ml  Output 2210 ml  Net -1730 ml   Filed Weights   12/18/18 0607 12/20/18 0518 12/21/18 0600  Weight: 129.4 kg 120.6 kg 115.5 kg    Examination:  General - alert Eyes - pupils reactive ENT - no sinus tenderness, no stridor Cardiac - regular rate/rhythm, no murmur Chest - equal breath sounds b/l, no wheezing or rales Abdomen - soft, non tender, + bowel sounds Extremities - no cyanosis, clubbing,  or edema Skin - no rashes Neuro - normal strength, moves extremities, follows commands  CXR (reviewed by me) - decreased b/l ASD   Discussion:  He has progressive hypoxia and pulmonary infiltrates with b/l pleural effusions.  He has been on antibiotics and diuretics w/o improvement.  Concern for an inflammatory process.  He was previously on amiodarone, but his radiographic findings appear to predate when he was started on amiodarone.  This was d/c'ed on 6/18.  There is some clinic improvement with addition of steroids.  Assessment & Plan:   Acute hypoxic respiratory failure with pulmonary infiltrates. Plan - oxygen to keep SpO2 > 90% - day 5 of Abx - change solumedrol to 40 mg q8h on 6/21 and wean as tolerated - f/u serology from 6/19 - continue lasix - not needing Bipap >> will d/c from room - f/u CXR intermittently  Reported history of COPD, but PFTs aren't consistent with this. Plan - prn BDs  Acute on chronic systolic CHF, CAD, PAF. Plan - per primary team and cardiology  Hx of DM with steroid induced hyperglycemia. Plan - SSI  Best practice:  Diet: regular diet DVT prophylaxis: lovenox GI prophylaxis: protonix Mobility: as tolerated Code Status: full code Disposition: Progressive care  Labs    CMP Latest Ref Rng & Units 12/21/2018 12/20/2018 12/19/2018  Glucose 70 - 99 mg/dL 221(H) 203(H) 179(H)  BUN 8 - 23 mg/dL 35(H) 30(H) 27(H)  Creatinine 0.61 - 1.24 mg/dL 1.09  1.08 1.17  Sodium 135 - 145 mmol/L 140 140 140  Potassium 3.5 - 5.1 mmol/L 3.2(L) 3.2(L) 3.8  Chloride 98 - 111 mmol/L 100 101 102  CO2 22 - 32 mmol/L 28 26 25   Calcium 8.9 - 10.3 mg/dL 9.2(T) 2.4(M) 6.2(M)  Total Protein 6.5 - 8.1 g/dL - - -  Total Bilirubin 0.3 - 1.2 mg/dL - - -  Alkaline Phos 38 - 126 U/L - - -  AST 15 - 41 U/L - - -  ALT 0 - 44 U/L - - -   CBC Latest Ref Rng & Units 12/21/2018 12/19/2018 12/18/2018  WBC 4.0 - 10.5 K/uL 17.2(H) 25.2(H) 26.6(H)  Hemoglobin 13.0 - 17.0 g/dL  63.8 12.2(L) 11.8(L)  Hematocrit 39.0 - 52.0 % 40.9 37.6(L) 37.0(L)  Platelets 150 - 400 K/uL 461(H) 441(H) 396   ABG    Component Value Date/Time   PHART 7.402 12/17/2018 0106   PCO2ART 31.2 (L) 12/17/2018 0106   PO2ART 72.0 (L) 12/17/2018 0106   HCO3 19.5 (L) 12/17/2018 0106   TCO2 20 (L) 12/17/2018 0106   ACIDBASEDEF 4.0 (H) 12/17/2018 0106   O2SAT 95.0 12/17/2018 0106   CBG (last 3)  Recent Labs    12/20/18 1607 12/20/18 2133 12/21/18 0749  GLUCAP 160* 304* 204*   Coralyn Helling, MD [facility] 12/21/2018, 11:41 AM

## 2018-12-21 NOTE — Progress Notes (Signed)
Progress Note  Patient Name: Justin Bradford. Date of Encounter: 12/21/2018  Primary Cardiologist:   Norman Herrlich, MD   Subjective   He reports another excellent night last night.  Breathing better.  No pain.   Inpatient Medications    Scheduled Meds: . aspirin EC  81 mg Oral Daily  . atorvastatin  80 mg Oral q1800  . azithromycin  500 mg Oral QHS  . carvedilol  3.125 mg Oral BID WC  . clonazePAM  1 mg Oral QHS  . enoxaparin (LOVENOX) injection  40 mg Subcutaneous Q24H  . FLUoxetine  60 mg Oral Daily  . furosemide  80 mg Intravenous Q12H  . insulin aspart  0-9 Units Subcutaneous TID WC  . lamoTRIgine  75 mg Oral QHS  . losartan  12.5 mg Oral Daily  . mouth rinse  15 mL Mouth Rinse BID  . methylPREDNISolone (SOLU-MEDROL) injection  60 mg Intravenous Q6H  . pantoprazole  40 mg Oral Daily  . potassium chloride SA  40 mEq Oral Daily  . spironolactone  12.5 mg Oral Daily  . tamsulosin  0.4 mg Oral QHS  . ticagrelor  90 mg Oral BID   Continuous Infusions: . sodium chloride 10 mL/hr at 12/17/18 1800  . cefTRIAXone (ROCEPHIN)  IV 1 g (12/20/18 1728)   PRN Meds: sodium chloride, acetaminophen **OR** acetaminophen, albuterol, ondansetron **OR** ondansetron (ZOFRAN) IV   Vital Signs    Vitals:   12/21/18 0400 12/21/18 0453 12/21/18 0600 12/21/18 0756  BP: 111/64   123/75  Pulse: 72   74  Resp: 19   16  Temp: 98.1 F (36.7 C)   97.8 F (36.6 C)  TempSrc: Oral   Axillary  SpO2: 95% 94%  94%  Weight:   115.5 kg   Height:        Intake/Output Summary (Last 24 hours) at 12/21/2018 1025 Last data filed at 12/21/2018 0757 Gross per 24 hour  Intake 480 ml  Output 2210 ml  Net -1730 ml   Filed Weights   12/18/18 0607 12/20/18 0518 12/21/18 0600  Weight: 129.4 kg 120.6 kg 115.5 kg    Telemetry    NSR - Personally Reviewed  ECG    NA - Personally Reviewed  Physical Exam   GEN: No  acute distress.   Neck: No  JVD Cardiac: RRR, no murmurs, rubs, or  gallops.  Respiratory:   Slight basilar crackles bilateral.  GI: Soft, nontender, non-distended, normal bowel sounds  MS:  No edema; No deformity. Neuro:   Nonfocal  Psych: Oriented and appropriate    Labs    Chemistry Recent Labs  Lab 12/16/18 2320  12/17/18 0507  12/19/18 0732 12/20/18 0828 12/21/18 0804  NA 134*   < > 133*   < > 140 140 140  K 4.3   < > 3.9   < > 3.8 3.2* 3.2*  CL 104  --  103   < > 102 101 100  CO2 16*  --  19*   < > 25 26 28   GLUCOSE 160*  --  267*   < > 179* 203* 221*  BUN 10  --  11   < > 27* 30* 35*  CREATININE 0.97  --  1.15   < > 1.17 1.08 1.09  CALCIUM 8.6*  --  8.5*   < > 8.8* 8.8* 8.8*  PROT 6.9  --  6.5  --   --   --   --  ALBUMIN 3.3*  --  3.1*  --   --   --   --   AST 19  --  19  --   --   --   --   ALT 14  --  15  --   --   --   --   ALKPHOS 88  --  85  --   --   --   --   BILITOT 1.4*  --  1.3*  --   --   --   --   GFRNONAA >60  --  >60   < > >60 >60 >60  GFRAA >60  --  >60   < > >60 >60 >60  ANIONGAP 14  --  11   < > 13 13 12    < > = values in this interval not displayed.     Hematology Recent Labs  Lab 12/18/18 1026 12/19/18 0732 12/21/18 0804  WBC 26.6* 25.2* 17.2*  RBC 3.97* 4.13* 4.46  HGB 11.8* 12.2* 13.0  HCT 37.0* 37.6* 40.9  MCV 93.2 91.0 91.7  MCH 29.7 29.5 29.1  MCHC 31.9 32.4 31.8  RDW 14.7 14.8 14.5  PLT 396 441* 461*    Cardiac Enzymes Recent Labs  Lab 12/16/18 2320 12/17/18 0507 12/17/18 1056 12/17/18 1714  TROPONINI 0.06* 0.18* 0.18* 0.17*   No results for input(s): TROPIPOC in the last 168 hours.   BNP Recent Labs  Lab 12/16/18 2321  BNP 916.8*     DDimer No results for input(s): DDIMER in the last 168 hours.   Radiology    Dg Chest Port 1 View  Result Date: 12/21/2018 CLINICAL DATA:  Pulmonary opacities EXAM: PORTABLE CHEST 1 VIEW COMPARISON:  12/19/2018 chest radiograph. FINDINGS: Stable cardiomediastinal silhouette with mild cardiomegaly. No pneumothorax. No pleural effusion. Patchy  opacities throughout both lungs have mildly improved. IMPRESSION: Cardiomegaly with mildly improved patchy opacities throughout both lungs, which could represent pulmonary edema or infection. Electronically Signed   By: Delbert PhenixJason A Poff M.D.   On: 12/21/2018 10:03   Dg Chest Port 1 View  Result Date: 12/19/2018 CLINICAL DATA:  History of heart failure. EXAM: PORTABLE CHEST 1 VIEW COMPARISON:  12/18/2018.  12/16/2018. FINDINGS: Cardiomegaly. Diffuse bilateral pulmonary infiltrates/edema again noted. No interim change. No pleural effusion or pneumothorax. Degenerative changes scoliosis thoracic spine. IMPRESSION: 1.  Cardiomegaly. 2. Diffuse bilateral pulmonary infiltrates/edema again noted. No interim change. Electronically Signed   By: Maisie Fushomas  Register   On: 12/19/2018 13:11    Cardiac Studies   Echocardiogram 10/2018:  1. The left ventricle has moderate-severely reduced systolic function, with an ejection fraction of 30-35%. The cavity size was normal. Left ventricular diastolic function could not be evaluated due to nondiagnostic images. 2. No evidence of LV thrombus by definity contrast. 3. The right ventricle has normal systolc function. The cavity was normal. There is no increase in right ventricular wall thickness. Right ventricular systolic pressure could not be assessed. 4. The aortic valve is tricuspid Moderate sclerosis of the aortic valve.  Patient Profile     78 y.o. 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 Blake DivineAkula.  Assessment & Plan    ACUTE ON CHRONIC SYSTOLIC HF:    Negative 6 liters this admission.  Continue IV Lasix.   HYPOKALEMIA:  Increase potassium supplementation this  AM.      CAD:  No evidence of ACS.  Medical management.     HTN:  BP is controlled.  Continue current therapy.   PAF:  Amiodarone  discontinued this admission and no evidence of recurrent fib.  No change in therapy.     For questions or updates, please contact [facility] Please consult [email or web address] for contact info under Cardiology/STEMI.   Signed, Minus Breeding, MD  12/21/2018, 10:25 AM

## 2018-12-21 NOTE — Evaluation (Signed)
Physical Therapy Evaluation Patient Details Name: Justin Bradford. MRN: [id number] DOB: Feb 21, 1941 Today's Date: 12/21/2018   History of Present Illness  78 y.o. male with history of recent non-ST elevation MI in April 2020 status post stent placement with systolic heart failure last EF measured last week was 35%, COPD, diabetes mellitus, hypertension presents to the ER because of worsening shortness of breath on exertion over the last 1 week, hypoxic, with elevated BNP, and elevated troponins. CT angio of the chest suspicous for patchy bilateral infiltrates concerning for infection vs edema.  Clinical Impression   Pt admitted with above diagnosis. Pt currently with functional limitations due to the deficits listed below (see PT Problem List). Independent and enjoyed volunteering prior to admission; Currently presenting with significantly decr functional capacity; Desaturated to low 80s with in-room mobility, on 10 L high flow [place];  Pt will benefit from skilled PT to increase their independence and safety with mobility to allow discharge to the venue listed below.       Follow Up Recommendations Home health PT;Other (comment)(Will watch closely for progress; if slow progress we will need to consider SNF to maximize independence and safety with mobility as he lives alone)    Equipment Recommendations  Rolling walker with 5" wheels;3in1 (PT)    Recommendations for Other Services       Precautions / Restrictions Precautions Precautions: Fall Restrictions Weight Bearing Restrictions: No Other Position/Activity Restrictions: watch O2      Mobility  Bed Mobility Overal bed mobility: Needs Assistance Bed Mobility: Supine to Sit     Supine to sit: Min guard;HOB elevated;Min assist     General bed mobility comments: Pt reaching for therapist arm to pull up on to power up trunk to EOB position.   Transfers Overall transfer level: Needs assistance Equipment used: Rolling walker (2  wheeled) Transfers: Sit to/from Stand Sit to Stand: Min guard         General transfer comment: to/from EOB and recliner. Min guard for safety. Cues for hand placement  Ambulation/Gait Ambulation/Gait assistance: Min guard Gait Distance (Feet): 8 Feet(bed to sink, then a few steps to reclienr to sit) Assistive device: Rolling walker (2 wheeled) Gait Pattern/deviations: Step-through pattern;Decreased step length - right;Decreased step length - left     General Gait Details: Good use of RW for support; cues for deep, controlled breathing as his O2 sats dropped to mid to low 80s even with 10L supplemental O2 via HFNC  Stairs            Wheelchair Mobility    Modified Rankin (Stroke Patients Only)       Balance Overall balance assessment: Needs assistance Sitting-balance support: No upper extremity supported;Feet supported Sitting balance-[name]: Fair     Standing balance support: No upper extremity supported;During functional activity Standing balance-[name]: Fair Standing balance comment: stood at sink to complete most of oral care task. 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 Pt occasionally uses rollator. Does not drive. Uses scooter when he goes to the store (been awhile).      Hand Dominance   Dominant Hand: Right    Extremity/Trunk Assessment   Upper Extremity Assessment Upper  Extremity Assessment: Defer to OT evaluation    Lower Extremity Assessment Lower Extremity Assessment: Generalized weakness       Communication   Communication: No difficulties;Other (comment)(hyperverbal, HOH? vs attention level)  Cognition Arousal/Alertness: Awake/alert Behavior During Therapy: WFL for tasks assessed/performed(hyperverbal) Overall Cognitive Status: No family/caregiver present to determine baseline cognitive functioning                                 General Comments: HOH vs attention deficits?      General Comments General comments (skin integrity, edema, etc.): Session conducted on 10 L supplemental oxygen via high Flow [place]; O2 sats decr to mid 80s with activity, recovered to greater tahn 92 % with seated rest    Exercises     Assessment/Plan    PT Assessment Patient needs continued PT services  PT Problem List Decreased strength;Decreased activity tolerance;Decreased balance;Decreased mobility;Decreased knowledge of use of DME;Decreased knowledge of precautions;Cardiopulmonary status limiting activity       PT Treatment Interventions DME instruction;Gait training;Functional mobility training;Therapeutic activities;Therapeutic exercise;Balance training;Cognitive remediation;Patient/family education    PT Goals (Current goals can be found in the Care Plan section)  Acute Rehab PT Goals Patient Stated Goal: keep feeling better. "I'd love to be able to volunteer again." PT Goal Formulation: With patient Time For Goal Achievement: 01/04/19 Potential to Achieve Goals: Good    Frequency Min 3X/week   Barriers to discharge Decreased caregiver support Must be independent and safe to dc home    Co-evaluation PT/OT/SLP Co-Evaluation/Treatment: Yes Reason for Co-Treatment: To address functional/ADL transfers PT goals addressed during session: Mobility/safety with mobility OT goals addressed during session: ADL's and  self-care;Strengthening/ROM       AM-PAC PT "6 Clicks" Mobility  Outcome Measure Help needed turning from your back to your side while in a flat bed without using bedrails?: A Little Help needed moving from lying on your back to sitting on the side of a flat bed without using bedrails?: A Little Help needed moving to and from a bed to a chair (including a wheelchair)?: A Little Help needed standing up from a chair using your arms (e.g., wheelchair or bedside chair)?: A Little Help needed to walk in hospital room?: A Little Help needed climbing 3-5 steps with a railing? : A Little 6 Click Score: 18    End of Session Equipment Utilized During Treatment: Gait belt;Oxygen Activity Tolerance: Patient tolerated treatment well Patient left: in chair;with call bell/phone within reach Nurse Communication: Mobility status PT Visit Diagnosis: Unsteadiness on feet (R26.81);Other abnormalities of gait and mobility (R26.89);Difficulty in walking, not elsewhere classified (R26.2)    Time: 0539-7673 PT Time Calculation (min) (ACUTE ONLY): 18 min   Charges:   PT Evaluation $PT Eval Moderate Complexity: 1 Mod          Van Clines, [place]  Acute Rehabilitation Services Pager [phone] Office [phone]'   Levi Aland 12/21/2018, 1:54 PM

## 2018-12-22 DIAGNOSIS — R0603 Acute respiratory distress: Secondary | ICD-10-CM

## 2018-12-22 DIAGNOSIS — J449 Chronic obstructive pulmonary disease, unspecified: Secondary | ICD-10-CM

## 2018-12-22 DIAGNOSIS — J189 Pneumonia, unspecified organism: Secondary | ICD-10-CM

## 2018-12-22 LAB — CULTURE, BLOOD (ROUTINE X 2)
Culture: NO GROWTH
Culture: NO GROWTH
Special Requests: ADEQUATE
Special Requests: ADEQUATE

## 2018-12-22 LAB — BASIC METABOLIC PANEL
Anion gap: 13 (ref 5–15)
BUN: 34 mg/dL — ABNORMAL HIGH (ref 8–23)
CO2: 30 mmol/L (ref 22–32)
Calcium: 8.9 mg/dL (ref 8.9–10.3)
Chloride: 97 mmol/L — ABNORMAL LOW (ref 98–111)
Creatinine, Ser: 1.1 mg/dL (ref 0.61–1.24)
GFR calc Af Amer: >60 mL/min (ref >60)
GFR calc non Af Amer: >60 mL/min (ref >60)
Glucose, Bld: 201 mg/dL — ABNORMAL HIGH (ref 70–99)
Potassium: 3.5 mmol/L (ref 3.5–5.1)
Sodium: 140 mmol/L (ref 135–145)

## 2018-12-22 LAB — ANCA TITERS
Atypical P-ANCA titer: <1:20 {titer}
C-ANCA: <1:20 {titer}
P-ANCA: <1:20 {titer}

## 2018-12-22 LAB — GLUCOSE, CAPILLARY
Glucose-Capillary: 169 mg/dL — ABNORMAL HIGH (ref 70–99)
Glucose-Capillary: 269 mg/dL — ABNORMAL HIGH (ref 70–99)
Glucose-Capillary: 207 mg/dL — ABNORMAL HIGH (ref 70–99)
Glucose-Capillary: 205 mg/dL — ABNORMAL HIGH (ref 70–99)

## 2018-12-22 MED ORDER — METHYLPREDNISOLONE SODIUM SUCC 40 MG IJ SOLR
40.0000 mg | Freq: Once | INTRAMUSCULAR | Status: AC
  Administered 2018-12-22: 40 mg via INTRAVENOUS
  Filled 2018-12-22: qty 1

## 2018-12-22 MED ORDER — FUROSEMIDE 80 MG PO TABS
80.0000 mg | ORAL_TABLET | Freq: Two times a day (BID) | ORAL | Status: DC
  Administered 2018-12-22: 80 mg via ORAL
  Filled 2018-12-22: qty 1

## 2018-12-22 MED ORDER — PREDNISONE 20 MG PO TABS
40.0000 mg | ORAL_TABLET | Freq: Every day | ORAL | Status: AC
  Administered 2018-12-23 – 2018-12-25 (×3): 40 mg via ORAL
  Filled 2018-12-22 (×3): qty 2

## 2018-12-22 NOTE — Plan of Care (Signed)
  Problem: Education: Goal: Knowledge of General Education information will improve Description: Including pain rating scale, medication(s)/side effects and non-pharmacologic comfort measures Outcome: Progressing   Problem: Health Behavior/Discharge Planning: Goal: Ability to manage health-related needs will improve Outcome: Progressing   Problem: Clinical Measurements: Goal: Respiratory complications will improve Outcome: Progressing   Problem: Nutrition: Goal: Adequate nutrition will be maintained Outcome: Progressing   Problem: Coping: Goal: Level of anxiety will decrease Outcome: Progressing   Problem: Pain Managment: Goal: General experience of comfort will improve Outcome: Progressing   

## 2018-12-22 NOTE — Care Management Important Message (Signed)
Important Message  Patient Details  Name: Justin Bradford. MRN: [id number] Date of Birth: Feb 24, 1941   Medicare Important Message Given:  Yes     Quintina Hakeem 12/22/2018, 2:09 PM

## 2018-12-22 NOTE — Progress Notes (Signed)
Progress Note  Patient Name: Neville Walston. Date of Encounter: 12/22/2018  Primary Cardiologist:   Shirlee More, MD   Subjective   He slept well last night.  No chest pain.  No SOB.  Up a little bit with OT yesterday.   Inpatient Medications    Scheduled Meds: . aspirin EC  81 mg Oral Daily  . atorvastatin  80 mg Oral q1800  . azithromycin  500 mg Oral QHS  . carvedilol  3.125 mg Oral BID WC  . clonazePAM  1 mg Oral QHS  . enoxaparin (LOVENOX) injection  40 mg Subcutaneous Q24H  . FLUoxetine  60 mg Oral Daily  . furosemide  80 mg Intravenous Q12H  . insulin aspart  0-9 Units Subcutaneous TID WC  . lamoTRIgine  75 mg Oral QHS  . losartan  12.5 mg Oral Daily  . mouth rinse  15 mL Mouth Rinse BID  . methylPREDNISolone (SOLU-MEDROL) injection  40 mg Intravenous Q8H  . pantoprazole  40 mg Oral Daily  . potassium chloride SA  40 mEq Oral Daily  . spironolactone  12.5 mg Oral Daily  . tamsulosin  0.4 mg Oral QHS  . ticagrelor  90 mg Oral BID   Continuous Infusions: . sodium chloride 10 mL/hr at 12/17/18 1800  . cefTRIAXone (ROCEPHIN)  IV 1 g (12/21/18 1750)   PRN Meds: sodium chloride, acetaminophen **OR** acetaminophen, albuterol, ondansetron **OR** ondansetron (ZOFRAN) IV   Vital Signs    Vitals:   12/21/18 1937 12/21/18 2323 12/22/18 0317 12/22/18 0518  BP: 108/76 99/66 109/69   Pulse: 72 68 66   Resp: (!) 24 15 15    Temp: 97.8 F (36.6 C) 98.2 F (36.8 C) 97.8 F (36.6 C)   TempSrc: Axillary Axillary Axillary   SpO2: 96% 97% 95%   Weight:    117.9 kg  Height:        Intake/Output Summary (Last 24 hours) at 12/22/2018 0715 Last data filed at 12/21/2018 2331 Gross per 24 hour  Intake -  Output 1450 ml  Net -1450 ml   Filed Weights   12/20/18 0518 12/21/18 0600 12/22/18 0518  Weight: 120.6 kg 115.5 kg 117.9 kg    Telemetry    NSR - Personally Reviewed  ECG    NA - Personally Reviewed  Physical Exam   GEN: No  acute distress.   Neck: No   JVD Cardiac: RRR, no murmurs, rubs, or gallops.  Respiratory:    Decreased breath sounds bilateral GI: Soft, nontender, non-distended, normal bowel sounds  MS:  No edema; No deformity. Neuro:   Nonfocal  Psych: Oriented and appropriate     Labs    Chemistry Recent Labs  Lab 12/16/18 2320  12/17/18 0507  12/19/18 0732 12/20/18 0828 12/21/18 0804  NA 134*   < > 133*   < > 140 140 140  K 4.3   < > 3.9   < > 3.8 3.2* 3.2*  CL 104  --  103   < > 102 101 100  CO2 16*  --  19*   < > 25 26 28   GLUCOSE 160*  --  267*   < > 179* 203* 221*  BUN 10  --  11   < > 27* 30* 35*  CREATININE 0.97  --  1.15   < > 1.17 1.08 1.09  CALCIUM 8.6*  --  8.5*   < > 8.8* 8.8* 8.8*  PROT 6.9  --  6.5  --   --   --   --  ALBUMIN 3.3*  --  3.1*  --   --   --   --   AST 19  --  19  --   --   --   --   ALT 14  --  15  --   --   --   --   ALKPHOS 88  --  85  --   --   --   --   BILITOT 1.4*  --  1.3*  --   --   --   --   GFRNONAA >60  --  >60   < > >60 >60 >60  GFRAA >60  --  >60   < > >60 >60 >60  ANIONGAP 14  --  11   < > 13 13 12    < > = values in this interval not displayed.     Hematology Recent Labs  Lab 12/18/18 1026 12/19/18 0732 12/21/18 0804  WBC 26.6* 25.2* 17.2*  RBC 3.97* 4.13* 4.46  HGB 11.8* 12.2* 13.0  HCT 37.0* 37.6* 40.9  MCV 93.2 91.0 91.7  MCH 29.7 29.5 29.1  MCHC 31.9 32.4 31.8  RDW 14.7 14.8 14.5  PLT 396 441* 461*    Cardiac Enzymes Recent Labs  Lab 12/16/18 2320 12/17/18 0507 12/17/18 1056 12/17/18 1714  TROPONINI 0.06* 0.18* 0.18* 0.17*   No results for input(s): TROPIPOC in the last 168 hours.   BNP Recent Labs  Lab 12/16/18 2321  BNP 916.8*     DDimer No results for input(s): DDIMER in the last 168 hours.   Radiology    Dg Chest Port 1 View  Result Date: 12/21/2018 CLINICAL DATA:  Pulmonary opacities EXAM: PORTABLE CHEST 1 VIEW COMPARISON:  12/19/2018 chest radiograph. FINDINGS: Stable cardiomediastinal silhouette with mild cardiomegaly. No  pneumothorax. No pleural effusion. Patchy opacities throughout both lungs have mildly improved. IMPRESSION: Cardiomegaly with mildly improved patchy opacities throughout both lungs, which could represent pulmonary edema or infection. Electronically Signed   By: Delbert Phenix M.D.   On: 12/21/2018 10:03    Cardiac Studies   Echocardiogram 10/2018:  1. The left ventricle has moderate-severely reduced systolic function, with an ejection fraction of 30-35%. The cavity size was normal. Left ventricular diastolic function could not be evaluated due to nondiagnostic images. 2. No evidence of LV thrombus by definity contrast. 3. The right ventricle has normal systolc function. The cavity was normal. There is no increase in right ventricular wall thickness. Right ventricular systolic pressure could not be assessed. 4. The aortic valve is tricuspid Moderate sclerosis of the aortic valve.  Patient Profile     78 y.o. 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 Blake Divine.  Assessment & Plan    ACUTE ON CHRONIC SYSTOLIC HF:    Negative 7.4 liters this admission.  Changed to PO Lasix today.   HYPOKALEMIA:  I gave extra potassium yesterday.  Labs still pending.  I will defer to primary team to supplement as needed today.    CAD:  No evidence of ACS.  Medical management   HTN:  BP is controlled.  Meds as above.   PAF:  Amiodarone discontinued this admission.  Maintaining NSR.  For questions or updates, please contact [facility] Please consult [email or web address] for contact info under Cardiology/STEMI.   Signed, Rollene Rotunda, MD  12/22/2018, 7:15 AM

## 2018-12-22 NOTE — Progress Notes (Signed)
Occupational Therapy Treatment Patient Details Name: Justin Bradford. MRN: [id number] DOB: [birth year] Today's Date: 12/22/2018    History of present illness 78 y.o. male with history of recent non-ST elevation MI in April 2020 status post stent placement with systolic heart failure last EF measured last week was 35%, COPD, diabetes mellitus, hypertension presents to the ER because of worsening shortness of breath on exertion over the last 1 week, hypoxic, with elevated BNP, and elevated troponins. CT angio of the chest suspicous for patchy bilateral infiltrates concerning for infection vs edema.   OT comments  Pt reports sleeping better last night and breathing improving.  Pt completed bed mobility with supervision and ambulated to toilet and sink without AD with Min guard.  O2 sats dropped on 5L to 88% while in standing at sink.  Pt able to recognize increase in labored breathing and reports leg weakness.  Returned to recliner where sats returned to low 90s.  Pt will continue to benefit from OT to improve dynamic balance and endurance as needed for ADLs as pt lives alone.  Follow Up Recommendations  Home health OT;Supervision - Intermittent    Equipment Recommendations  None recommended by OT       Precautions / Restrictions Precautions Precautions: Fall Precaution Comments: watch O2       Mobility Bed Mobility Overal bed mobility: Needs Assistance Bed Mobility: Supine to Sit     Supine to sit: Supervision;HOB elevated        Transfers Overall transfer level: Needs assistance Equipment used: None Transfers: Sit to/from [facility] Sit to Stand: Min guard Stand pivot transfers: Min guard       General transfer comment: to/from EOB and recliner. Min guard for safety. Cues for hand placement and management of O2 tubing        ADL either performed or assessed with clinical judgement   ADL Overall ADL's : Needs assistance/impaired Eating/Feeding: Set  up;Sitting   Grooming: Oral care;Min guard;Standing Grooming Details (indicate cue type and reason): O2 dropped from 93 to 88 on 5L O2.  Pt reports feeling somewhat impaired breathing and weakness in legs.  Returned to sitting on recliner and O2 recovered with seated rest break.                 Toilet Transfer: Min guard;Ambulation   Toileting- Clothing Manipulation and Hygiene: Min guard;Sit to/from stand       Functional mobility during ADLs: Min guard General ADL Comments: Pt walked to toilet and sink without AD this session.  Educated on ways to self monitor O2 levels and provided with energy conservation strategies.               Cognition Arousal/Alertness: Awake/alert Behavior During Therapy: WFL for tasks assessed/performed Overall Cognitive Status: No family/caregiver present to determine baseline cognitive functioning                                                     Pertinent Vitals/ Pain       Pain Assessment: No/denies pain         Frequency  Min 2X/week        Progress Toward Goals  OT Goals(current goals can now be found in the care plan section)  Progress towards OT goals: Progressing toward goals  Acute Rehab OT Goals Patient  Stated Goal: keep feeling better. "I'd love to be able to volunteer again." OT Goal Formulation: With patient Time For Goal Achievement: 01/04/19 Potential to Achieve Goals: Good  Plan Discharge plan remains appropriate       AM-PAC OT "6 Clicks" Daily Activity     Outcome Measure   Help from another person eating meals?: None Help from another person taking care of personal grooming?: None Help from another person toileting, which includes using toliet, bedpan, or urinal?: None Help from another person bathing (including washing, rinsing, drying)?: A Little Help from another person to put on and taking off regular upper body clothing?: None Help from another person to put on and taking  off regular lower body clothing?: A Little 6 Click Score: 22    End of Session Equipment Utilized During Treatment: Gait belt;Oxygen  OT Visit Diagnosis: Unsteadiness on feet (R26.81);Muscle weakness (generalized) (M62.81)   Activity Tolerance (O2 sats dropped in standing)   Patient Left in chair;with call bell/phone within reach   Nurse Communication Mobility status        Time: 0810-0828 OT Time Calculation (min): 18 min  Charges: OT General Charges $OT Visit: 1 Visit OT Treatments $Self Care/Home Management : 8-22 mins    Rosalio Loud, [phone] 12/22/2018, 9:22 AM

## 2018-12-22 NOTE — Progress Notes (Signed)
Inpatient Diabetes Program Recommendations  AACE/ADA: New Consensus Statement on Inpatient Glycemic Control (2015)  Target Ranges:  Prepandial:   less than 140 mg/dL      Peak postprandial:   less than 180 mg/dL (1-2 hours)      Critically ill patients:  140 - 180 mg/dL   Lab Results  Component Value Date   GLUCAP 169 (H) 12/22/2018    Review of Glycemic Control Results for HARJOT, DIBELLO "DON" (MRN [id number]) as of 12/22/2018 10:17  Ref. Range 12/21/2018 12:03 12/21/2018 17:09 12/21/2018 22:14 12/22/2018 08:06  Glucose-Capillary Latest Ref Range: 70 - 99 mg/dL 215 (H) 212 (H) 258 (H) 169 (H)   Diabetes history: DM 2 Outpatient Diabetes medications: Metformin 250 mg daily Current orders for Inpatient glycemic control:  Novolog sensitive tid with meals Solumedrol 40 mg IV q 8 hours Inpatient Diabetes Program Recommendations:   While on steroids consider adding Levemir 12 units q AM.   Thanks,  Adah Perl, RN, BC-ADM Inpatient Diabetes Coordinator Pager [phone] (8a-5p)

## 2018-12-22 NOTE — Progress Notes (Signed)
PROGRESS NOTE    Justin Bradford.  UXL:[id number] DOB: June 17, 1941 DOA: 12/16/2018 PCP: Forrest Moron, MD    Brief Narrative:  Justin Bradfordis a 78 y.o.malewithhistory of recent non-ST elevation MI in April 2020 status post stent placement with systolic heart failure last EF measured last week was 35%, COPD, diabetes mellitus, hypertension presents to the ER because of worsening shortness of breath on exertion over the last 1 week, hypoxic, with elevated BNP, and elevated troponins. CT angio of the chest suspicous for patchy bilateral infiltrates concerning for infection vs edema.   His COVID inpatient test was negative.  He was started on Broad spectrum IV antibiotics, and IV lasix for diuresis and IV steroids.  Repeat NOVEL Coronavirus test negative.  His breathing has improved, transitioned to oral lasix, discontinue antibiotics on 6/23 and plan for discharge in 24 to 48hours.   Assessment & Plan:   Principal Problem:   Acute respiratory failure with hypoxia (HCC) Active Problems:   COPD (chronic obstructive pulmonary disease) (HCC)   Diabetes mellitus type 2 in obese (HCC)   Chronic systolic heart failure (HCC)   CAD in native artery   PAF (paroxysmal atrial fibrillation) (HCC)   CAP (community acquired pneumonia)   Acute respiratory distress   Acute respiratory failure with hypoxia Differential include acute systolic heart failure and community acquired pneumonia and COPD exacerbation.  Admitted to progressive unit.  Though covid 19 test is negative, in view of the bilateral patchy infiltrates a repeat send out test sent, which is negative. He remains afebrile, without productive cough.  Meanwhile started the patient on IV rocephin and zithromax, IV steroids and bronchodilators.  Discontinue antibiotics after tomorrow's dose.  IV lasix transitioned to oral lasix 80 mg BID . Diuresed about 8 lit since admission.  Taper to prednisone starting from tomorrow in the  next 2 weeks. Appreciate PCCM recommendations.   Repeat CXR shows improvement in the the bilateral infiltrates.  Respiratory panel sent and negative.     CAD: Pt on aspirin , statin, coreg, and losartan. Continue the same. Cardiology restarted the Brillinta.  He denies any chest pain at this time.     Type 2 DM: CBG (last 3)  Recent Labs    12/21/18 2214 12/22/18 0806 12/22/18 1222  GLUCAP 258* 169* 269*   Resume SSI while inpatient.  Tapering the steroids. Add novolog 3 units TIDAC.  Anxiety and depression: Resume home meds.   Paroxysmal atrial fibrillation  On amiodarone which has been stopped as pt is in NSR.     DVT prophylaxis: lovenox.   Code Status: full code.  Family Communication: none at bedside. Called his sister and updated.  Disposition Plan: pending clinical improvement.    Consultants:   Cardiology. Dr Tenny Craw  Pulmonology Dr Craige Cotta  Procedures:  CT angiogram  of the chest.   Antimicrobials: Rocephin and zithromax.   Subjective: [name].   Objective: Vitals:   12/22/18 0317 12/22/18 0518 12/22/18 0835 12/22/18 1159  BP: 109/69  102/67 108/62  Pulse: 66  73 74  Resp: 15  19 18   Temp: 97.8 F (36.6 C)  (!) 97.2 F (36.2 C) 97.8 F (36.6 C)  TempSrc: Axillary  Axillary Oral  SpO2: 95%  96% 96%  Weight:  117.9 kg    Height:        Intake/Output Summary (Last 24 hours) at 12/22/2018 1237 Last data filed at 12/22/2018 0755 Gross per 24 hour  Intake -  Output 1575 ml  Net -1575 ml   Filed Weights   12/20/18 0518 12/21/18 0600 12/22/18 0518  Weight: 120.6 kg 115.5 kg 117.9 kg    Examination:  General exam: no distress noted and is on 5 lit  HF of [name] Respiratory system: air entry fair. Scattered wheezing.  Cardiovascular system: S1 & S2 heard, RRR.  Gastrointestinal system: Abdomen is soft NT ND BS+ Central nervous system: Alert and oriented.non focal. Extremities: Symmetric 5 x 5 power.  Skin: No rashes, lesions or ulcers Psychiatry:  Mood & affect appropriate.     Data Reviewed: I have personally reviewed following labs and imaging studies  CBC: Recent Labs  Lab 12/16/18 2320 12/17/18 0106 12/17/18 0507 12/18/18 1026 12/19/18 0732 12/21/18 0804  WBC 18.0*  --  15.4* 26.6* 25.2* 17.2*  NEUTROABS 13.8*  --  14.4*  --   --   --   HGB 12.5* 12.9* 12.2* 11.8* 12.2* 13.0  HCT 38.9* 38.0* 38.5* 37.0* 37.6* 40.9  MCV 93.1  --  94.1 93.2 91.0 91.7  PLT 382  --  344 396 441* 461*   Basic Metabolic Panel: Recent Labs  Lab 12/17/18 0507 12/18/18 1026 12/19/18 0732 12/20/18 0828 12/21/18 0804 12/22/18 0922  NA 133* 138 140 140 140 140  K 3.9 4.0 3.8 3.2* 3.2* 3.5  CL 103 105 102 101 100 97*  CO2 19* 23 25 26 28 30   GLUCOSE 267* 188* 179* 203* 221* 201*  BUN 11 22 27* 30* 35* 34*  CREATININE 1.15 1.14 1.17 1.08 1.09 1.10  CALCIUM 8.5* 8.7* 8.8* 8.8* 8.8* 8.9  MG 2.2  --   --   --  2.2  --    GFR: Estimated Creatinine Clearance: 75.7 mL/min (by C-G formula based on SCr of 1.1 mg/dL). Liver Function Tests: Recent Labs  Lab 12/16/18 2320 12/17/18 0507  AST 19 19  ALT 14 15  ALKPHOS 88 85  BILITOT 1.4* 1.3*  PROT 6.9 6.5  ALBUMIN 3.3* 3.1*   No results for input(s): LIPASE, AMYLASE in the last 168 hours. No results for input(s): AMMONIA in the last 168 hours. Coagulation Profile: No results for input(s): INR, PROTIME in the last 168 hours. Cardiac Enzymes: Recent Labs  Lab 12/16/18 2320 12/17/18 0507 12/17/18 1056 12/17/18 1714  TROPONINI 0.06* 0.18* 0.18* 0.17*   BNP (last 3 results) No results for input(s): PROBNP in the last 8760 hours. HbA1C: No results for input(s): HGBA1C in the last 72 hours. CBG: Recent Labs  Lab 12/21/18 1203 12/21/18 1709 12/21/18 2214 12/22/18 0806 12/22/18 1222  GLUCAP 215* 212* 258* 169* 269*   Lipid Profile: No results for input(s): CHOL, HDL, LDLCALC, TRIG, CHOLHDL, LDLDIRECT in the last 72 hours.  Thyroid Function Tests: No results for input(s): TSH, T4TOTAL, FREET4, T3FREE, THYROIDAB in the last 72 hours. Anemia Panel: No results for input(s): VITAMINB12, FOLATE, FERRITIN, TIBC, IRON, RETICCTPCT in the last 72 hours. Sepsis Labs: Recent Labs  Lab 12/17/18 0024  LATICACIDVEN 1.4    Recent Results (from the past 240 hour(s))  SARS Coronavirus 2     Status: None   Collection Time: 12/16/18 11:00 PM  Result Value Ref Range Status   SARS Coronavirus 2 NOT DETECTED NOT DETECTED Final    Comment: (NOTE) SARS-CoV-2 target nucleic acids are NOT DETECTED. The SARS-CoV-2 RNA is generally detectable in upper and lower respiratory specimens during the acute phase of infection.  Negative  results do not preclude SARS-CoV-2 infection, do not rule  out co-infections with other pathogens, and should not be used as the sole basis for treatment or other patient management decisions.  Negative results must be combined with clinical observations, patient history, and epidemiological information. The expected result is Not Detected. Fact Sheet for Patients: http://www.biofiredefense.com/wp-content/uploads/2020/03/BIOFIRE-COVID -19-patients.pdf Fact Sheet for Healthcare Providers: http://www.biofiredefense.com/wp-content/uploads/2020/03/BIOFIRE-COVID -19-hcp.pdf This test is not yet approved or cleared by the [place] and  has been authorized for detection and/or diagnosis of SARS-CoV-2 by FDA under an Emergency Use Authorization (EUA).  This EUA will remain in effec t (meaning this test can be used) for the duration of  the COVID-19 declaration under Section 564(b)(1) of the Act, 21 U.S.C. section 360bbb-3(b)(1), unless the authorization is terminated or revoked sooner. Performed at [facility], [place] [address], [place], [place] [address]   Novel Coronavirus,NAA,(SEND-OUT TO REF LAB - TAT 24-48 hrs); Hosp Order     Status: None   Collection Time: 12/16/18 11:00 PM    Specimen: Nasopharyngeal Swab; Respiratory  Result Value Ref Range Status   SARS-CoV-2, NAA NOT DETECTED NOT DETECTED Final    Comment: (NOTE) This test was developed and its performance characteristics determined by [employer]. This test has not been FDA cleared or approved. This test has been authorized by FDA under an Emergency Use Authorization (EUA). This test is only authorized for the duration of time the declaration that circumstances exist justifying the authorization of the emergency use of in vitro diagnostic tests for detection of SARS-CoV-2 virus and/or diagnosis of COVID-19 infection under section 564(b)(1) of the Act, 21 U.S.C. 644IHK-7(Q)(2), unless the authorization is terminated or revoked sooner. When diagnostic testing is negative, the possibility of a false negative result should be considered in the context of a patient's recent exposures and the presence of clinical signs and symptoms consistent with COVID-19. An individual without symptoms of COVID-19 and who is not shedding SARS-CoV-2 virus would expect to have a negative (not detected) result in this assay. Performed  At: [facility] [address] [place], [place] [id number] Rush Farmer MD [id number]    [place]  Final    Comment: Performed at [facility], [place] [address], [place], [place] [address]  Blood culture (routine x 2)     Status: None   Collection Time: 12/17/18 12:25 AM   Specimen: BLOOD LEFT ARM  Result Value Ref Range Status   Specimen Description BLOOD LEFT ARM  Final   Special Requests   Final    BOTTLES DRAWN AEROBIC AND ANAEROBIC Blood Culture adequate volume   Culture   Final    NO GROWTH 5 DAYS Performed at [facility], [place] [address], [place], [place] [address]    Report Status 12/22/2018 FINAL  Final  Blood culture (routine x 2)     Status: None   Collection Time: 12/17/18  1:40 AM   Specimen: BLOOD LEFT ARM  Result  Value Ref Range Status   Specimen Description BLOOD LEFT ARM  Final   Special Requests   Final    BOTTLES DRAWN AEROBIC AND ANAEROBIC Blood Culture adequate volume   Culture   Final    NO GROWTH 5 DAYS Performed at [facility], [place] [address], [place], [place] [address]    Report Status 12/22/2018 FINAL  Final  Respiratory Panel by PCR     Status: None   Collection Time: 12/19/18  7:46 PM   Specimen: Nasopharyngeal Swab; Respiratory  Result Value Ref Range  Status   Adenovirus NOT DETECTED NOT DETECTED Final   Coronavirus 229E NOT DETECTED NOT DETECTED Final    Comment: (NOTE) The Coronavirus on the Respiratory Panel, DOES NOT test for the novel  Coronavirus (2019 nCoV)    Coronavirus HKU1 NOT DETECTED NOT DETECTED Final   Coronavirus NL63 NOT DETECTED NOT DETECTED Final   Coronavirus OC43 NOT DETECTED NOT DETECTED Final   Metapneumovirus NOT DETECTED NOT DETECTED Final   Rhinovirus / Enterovirus NOT DETECTED NOT DETECTED Final   Influenza A NOT DETECTED NOT DETECTED Final   Influenza B NOT DETECTED NOT DETECTED Final   Parainfluenza Virus 1 NOT DETECTED NOT DETECTED Final   Parainfluenza Virus 2 NOT DETECTED NOT DETECTED Final   Parainfluenza Virus 3 NOT DETECTED NOT DETECTED Final   Parainfluenza Virus 4 NOT DETECTED NOT DETECTED Final   Respiratory Syncytial Virus NOT DETECTED NOT DETECTED Final   Bordetella pertussis NOT DETECTED NOT DETECTED Final   Chlamydophila pneumoniae NOT DETECTED NOT DETECTED Final   Mycoplasma pneumoniae NOT DETECTED NOT DETECTED Final    Comment: Performed at [facility], [address] [place], [place] [address]         Radiology Studies: Dg Chest Port 1 View  Result Date: 12/21/2018 CLINICAL DATA:  Pulmonary opacities EXAM: PORTABLE CHEST 1 VIEW COMPARISON:  12/19/2018 chest radiograph. FINDINGS: Stable cardiomediastinal silhouette with mild cardiomegaly. No pneumothorax. No pleural effusion. Patchy opacities throughout both  lungs have mildly improved. IMPRESSION: Cardiomegaly with mildly improved patchy opacities throughout both lungs, which could represent pulmonary edema or infection. Electronically Signed   By: Delbert PhenixJason A Poff M.D.   On: 12/21/2018 10:03        Scheduled Meds: . aspirin EC  81 mg Oral Daily  . atorvastatin  80 mg Oral q1800  . azithromycin  500 mg Oral QHS  . carvedilol  3.125 mg Oral BID WC  . clonazePAM  1 mg Oral QHS  . enoxaparin (LOVENOX) injection  40 mg Subcutaneous Q24H  . FLUoxetine  60 mg Oral Daily  . furosemide  80 mg Oral BID  . insulin aspart  0-9 Units Subcutaneous TID WC  . lamoTRIgine  75 mg Oral QHS  . losartan  12.5 mg Oral Daily  . mouth rinse  15 mL Mouth Rinse BID  . methylPREDNISolone (SOLU-MEDROL) injection  40 mg Intravenous Once  . pantoprazole  40 mg Oral Daily  . potassium chloride SA  40 mEq Oral Daily  . [START ON 12/23/2018] predniSONE  40 mg Oral Q breakfast  . spironolactone  12.5 mg Oral Daily  . tamsulosin  0.4 mg Oral QHS  . ticagrelor  90 mg Oral BID   Continuous Infusions: . sodium chloride 10 mL/hr at 12/17/18 1800  . cefTRIAXone (ROCEPHIN)  IV 1 g (12/21/18 1750)     LOS: 5 days    Time spent: 34 minutes     Kathlen ModyVijaya Shardai Star, MD [facility] Pager [phone]   If 7PM-7AM, please contact night-coverage [email or web address] Password [email or web address] 12/22/2018, 12:37 PM

## 2018-12-22 NOTE — Progress Notes (Signed)
NAME:  Justin Copelin., MRN:  [id number], DOB:  [birth year], LOS: 5 ADMISSION DATE:  12/16/2018, CONSULTATION DATE:  12/19/2018 REFERRING MD:  Dr. Blake Divine, [facility], CHIEF COMPLAINT:  Short of breath   Brief History   78 yo male former smoker with progressive dyspnea, hypoxia and pulmonary infiltrates after having DES in April 2020 with systolic CHF.  Past Medical History  CAD, HTN, Systolic CHF, Neuropathy, OA  Significant Hospital Events   6/16 Admit 6/19 Start steroids; speech swallow assessment >> no issues  Consults:  Cardiology 6/18 CHF, CAD, PAF  Procedures:    Significant Diagnostic Tests:  PFT 09/18/18 >> FEV1 2.30 (72%), FEV1% 73%, TLC 8.44 (113%), DLCO 101% CT angio chest 10/15/18 >> scattered GGO CT chest 10/23/18 >> small effusions, increased opacities in upper lobes  Echo 12/10/18 >> EF 35%, mod LVH, ascending aorta 40 mm CT angio chest 12/17/18 >> diffuse b/l ASD, moderate b/l effusions  Micro Data:  COVID 6/16 >> negative Send out COVID 6/16 >> negative Blood 6/17 >>  RVP 6/19 >> negative Pneumococcal Ag 6/19 >> negative Legionella Ag 6/19 >>  Antimicrobials:  Rocephin 6/17 >> Zithromax 6/17 >>   Interim history/subjective:  No events overnight, O2 demand is stable  Objective   Blood pressure 102/67, pulse 73, temperature (!) 97.2 F (36.2 C), temperature source Axillary, resp. rate 19, height 6' (1.829 m), weight 117.9 kg, SpO2 96 %.        Intake/Output Summary (Last 24 hours) at 12/22/2018 0958 Last data filed at 12/22/2018 0755 Gross per 24 hour  Intake -  Output 1575 ml  Net -1575 ml   Filed Weights   12/20/18 0518 12/21/18 0600 12/22/18 0518  Weight: 120.6 kg 115.5 kg 117.9 kg   Examination:  General - Alert and interactive, moving all ext to command Eyes - [place]/AT, PERRL, EOM-I and MMM ENT - Clear Cardiac - RRR, Nl S1/S2 and -M/R/G Chest - Quite but clear Abdomen - Soft, NT, ND and +BS Extremities - -edema and -tenderness Skin - Clear  Neuro - normal strength, moves extremities, follows commands  I reviewed CXR myself, infiltrate noted   Discussion:  He has progressive hypoxia and pulmonary infiltrates with b/l pleural effusions.  He has been on antibiotics and diuretics w/o improvement.  Concern for an inflammatory process.  He was previously on amiodarone, but his radiographic findings appear to predate when he was started on amiodarone.  This was d/c'ed on 6/18.  There is some clinical improvement with addition of steroids.  Assessment & Plan:   Acute hypoxic respiratory failure with pulmonary infiltrates. Plan - Oxygen to keep SpO2 > 90%, currently on 5L with sat of 96%, can titrate down - Day 6 of Abx, recommend 7 days - Changed solumedrol to 40 mg q8h on 6/21, change to prednisone 40 mg with slow taper over 2 weeks - F/u serology from 6/19 all negative but RA Latix that is weakly positive - Continue lasix as renal function allows - D/C BiPAP from room - F/u CXR intermittently - Recommend an ambulatory desat study prior to discharge as he will need home O2  - F/U in the clinic  Reported history of COPD, but PFTs aren't consistent with this. Plan - BD to PRN - Steroids as above  Acute on chronic systolic CHF, CAD, PAF. Plan - Per primary team and cardiology  Hx of DM with steroid induced hyperglycemia. Plan - SSI  Discussed with PCCM-NP.  PCCM will be available PRN, please  call back if needed.  Best practice:  Diet: regular diet DVT prophylaxis: lovenox GI prophylaxis: protonix Mobility: as tolerated Code Status: full code Disposition: Progressive care  Labs    CMP Latest Ref Rng & Units 12/21/2018 12/20/2018 12/19/2018  Glucose 70 - 99 mg/dL 221(H) 203(H) 179(H)  BUN 8 - 23 mg/dL 35(H) 30(H) 27(H)  Creatinine 0.61 - 1.24 mg/dL 1.09 1.08 1.17  Sodium 135 - 145 mmol/L 140 140 140  Potassium 3.5 - 5.1 mmol/L 3.2(L) 3.2(L) 3.8  Chloride 98 - 111 mmol/L 100 101 102  CO2 22 - 32 mmol/L 28 26 25    Calcium 8.9 - 10.3 mg/dL 8.8(L) 8.8(L) 8.8(L)  Total Protein 6.5 - 8.1 g/dL - - -  Total Bilirubin 0.3 - 1.2 mg/dL - - -  Alkaline Phos 38 - 126 U/L - - -  AST 15 - 41 U/L - - -  ALT 0 - 44 U/L - - -   CBC Latest Ref Rng & Units 12/21/2018 12/19/2018 12/18/2018  WBC 4.0 - 10.5 K/uL 17.2(H) 25.2(H) 26.6(H)  Hemoglobin 13.0 - 17.0 g/dL 13.0 12.2(L) 11.8(L)  Hematocrit 39.0 - 52.0 % 40.9 37.6(L) 37.0(L)  Platelets 150 - 400 K/uL 461(H) 441(H) 396   ABG    Component Value Date/Time   PHART 7.402 12/17/2018 0106   PCO2ART 31.2 (L) 12/17/2018 0106   PO2ART 72.0 (L) 12/17/2018 0106   HCO3 19.5 (L) 12/17/2018 0106   TCO2 20 (L) 12/17/2018 0106   ACIDBASEDEF 4.0 (H) 12/17/2018 0106   O2SAT 95.0 12/17/2018 0106   CBG (last 3)  Recent Labs    12/21/18 1709 12/21/18 2214 12/22/18 0806  GLUCAP 212* 258* 169*   Rush Farmer, M.D. [facility] Pulmonary/Critical Care Medicine. Pager: [phone]. After hours pager: [phone].  12/22/2018, 9:58 AM

## 2018-12-23 LAB — BASIC METABOLIC PANEL
Anion gap: 10 (ref 5–15)
BUN: 35 mg/dL — ABNORMAL HIGH (ref 8–23)
CO2: 30 mmol/L (ref 22–32)
Calcium: 8.5 mg/dL — ABNORMAL LOW (ref 8.9–10.3)
Chloride: 100 mmol/L (ref 98–111)
Creatinine, Ser: 1.14 mg/dL (ref 0.61–1.24)
GFR calc Af Amer: >60 mL/min (ref >60)
GFR calc non Af Amer: >60 mL/min (ref >60)
Glucose, Bld: 220 mg/dL — ABNORMAL HIGH (ref 70–99)
Potassium: 3.7 mmol/L (ref 3.5–5.1)
Sodium: 140 mmol/L (ref 135–145)

## 2018-12-23 LAB — GLUCOSE, CAPILLARY
Glucose-Capillary: 207 mg/dL — ABNORMAL HIGH (ref 70–99)
Glucose-Capillary: 191 mg/dL — ABNORMAL HIGH (ref 70–99)
Glucose-Capillary: 164 mg/dL — ABNORMAL HIGH (ref 70–99)
Glucose-Capillary: 269 mg/dL — ABNORMAL HIGH (ref 70–99)

## 2018-12-23 LAB — HEMOGLOBIN A1C
Hgb A1c MFr Bld: 6.3 % — ABNORMAL HIGH (ref 4.8–5.6)
Mean Plasma Glucose: 134 mg/dL

## 2018-12-23 MED ORDER — INSULIN ASPART 100 UNIT/ML ~~LOC~~ SOLN: 2.0000 [IU] | Freq: Three times a day (TID) | SUBCUTANEOUS | Status: DC

## 2018-12-23 MED ORDER — FUROSEMIDE 80 MG PO TABS
80.0000 mg | ORAL_TABLET | Freq: Every day | ORAL | Status: DC
  Administered 2018-12-24 – 2018-12-25 (×2): 80 mg via ORAL
  Filled 2018-12-23 (×2): qty 1

## 2018-12-23 MED ORDER — INSULIN ASPART 100 UNIT/ML ~~LOC~~ SOLN
3.0000 [IU] | Freq: Three times a day (TID) | SUBCUTANEOUS | Status: DC
  Administered 2018-12-24 – 2018-12-25 (×5): 3 [IU] via SUBCUTANEOUS

## 2018-12-23 MED ORDER — LOSARTAN POTASSIUM 25 MG PO TABS
25.0000 mg | ORAL_TABLET | Freq: Every day | ORAL | Status: DC
  Administered 2018-12-24 – 2018-12-25 (×2): 25 mg via ORAL
  Filled 2018-12-23 (×2): qty 1

## 2018-12-23 MED ORDER — FUROSEMIDE 10 MG/ML IJ SOLN
INTRAMUSCULAR | Status: AC
  Filled 2018-12-23: qty 8

## 2018-12-23 NOTE — Progress Notes (Signed)
PROGRESS NOTE    Justin PoseyBruce D Gatta Jr.  ZOX:[id number] DOB: 02/26/1941 DOA: 12/16/2018 PCP: Forrest Moronuehle, Stephen, MD    Brief Narrative:  Justin Bradfordis a 78 y.o.malewithhistory of recent non-ST elevation MI in April 2020 status post stent placement with systolic heart failure last EF measured last week was 35%, COPD, diabetes mellitus, hypertension presents to the ER because of worsening shortness of breath on exertion over the last 1 week, hypoxic, with elevated BNP, and elevated troponins. CT angio of the chest suspicous for patchy bilateral infiltrates concerning for infection vs edema.   His COVID inpatient test was negative.  He was started on Broad spectrum IV antibiotics, and IV lasix for diuresis and IV steroids.  Repeat NOVEL Coronavirus test negative.  His breathing has improved, transitioned to oral lasix, discontinue antibiotics on 6/23 and plan for discharge in 24 to 48 hours.   Assessment & Plan:   Principal Problem:   Acute respiratory failure with hypoxia (HCC) Active Problems:   COPD (chronic obstructive pulmonary disease) (HCC)   Diabetes mellitus type 2 in obese (HCC)   Chronic systolic heart failure (HCC)   CAD in native artery   PAF (paroxysmal atrial fibrillation) (HCC)   CAP (community acquired pneumonia)   Acute respiratory distress   Acute respiratory failure with hypoxia Differential include acute systolic heart failure and community acquired pneumonia and COPD exacerbation.  Admitted to progressive unit.  Though covid 19 test is negative, in view of the bilateral patchy infiltrates a repeat send out test sent, which is negative. He remains afebrile, without productive cough.  Meanwhile started the patient on IV rocephin and zithromax, IV steroids and bronchodilators.  Discontinue antibiotics after today's dose. IV lasix transitioned to oral lasix 80 mg BID to 80 mg  daily today . Diuresed about 10  lit since admission.  Taper to prednisone starting  from today in the next 2 weeks. Appreciate PCCM recommendations.   Repeat CXR shows improvement in the the bilateral infiltrates.  Respiratory panel sent and negative.     CAD: Pt on aspirin , statin, coreg, and losartan. Continue the same. Cardiology restarted the Brillinta.  He denies any chest pain at this time.     Type 2 DM: CBG (last 3)  Recent Labs    12/22/18 2126 12/23/18 0730 12/23/18 1113  GLUCAP 205* 207* 191*   Resume SSI while inpatient.  Tapering the steroids. Add novolog 3 units TIDAC.  Anxiety and depression: Resume home meds.   Paroxysmal atrial fibrillation  On amiodarone which has been stopped as pt is in NSR.     DVT prophylaxis: lovenox.   Code Status: full code.  Family Communication: none at bedside. Called his sister and updated.  Disposition Plan: possible d/c tomorrow after ambulation with PT and checking ambulating oxygen levels.    Consultants:   Cardiology. Dr Tenny Crawoss  Pulmonology Dr Craige CottaSood  Procedures:  CT angiogram  of the chest.   Antimicrobials: Rocephin and zithromax. Completed on 6/23.  Subjective: No chest pain , sob improving.  In good spirits.  Would like to avoid going home with oxygen.  Plan to check ambulating oxygen levels tomorrow prior to discharge.   Objective: Vitals:   12/23/18 0323 12/23/18 0731 12/23/18 1000 12/23/18 1114  BP: 115/68 104/78 105/67 98/80  Pulse: 72 71  69  Resp: 19 18 (!) 21 20  Temp: (!) 97.3 F (36.3 C) 97.6 F (36.4 C)  97.6 F (36.4 C)  TempSrc: Oral Oral  Oral  SpO2: 96% 99% 97% 97%  Weight:      Height:        Intake/Output Summary (Last 24 hours) at 12/23/2018 1317 Last data filed at 12/23/2018 0911 Gross per 24 hour  Intake 957.95 ml  Output 2575 ml  Net -1617.05 ml   Filed Weights   12/20/18 0518 12/21/18 0600 12/22/18 0518  Weight: 120.6 kg 115.5 kg 117.9 kg    Examination:  General exam: no distress noted and is on 4 lit of oxygen.  Respiratory system: air  entry fair. Scattered wheezing.  Cardiovascular system: S1 & S2 heard, RRR.  Gastrointestinal system: Abdomen is soft NT ND BS+ Central nervous system: Alert and oriented.non focal. Extremities: Symmetric 5 x 5 power. Skin: No rashes, lesions or ulcers Psychiatry:  Mood & affect appropriate.     Data Reviewed: I have personally reviewed following labs and imaging studies  CBC: Recent Labs  Lab 12/16/18 2320 12/17/18 0106 12/17/18 0507 12/18/18 1026 12/19/18 0732 12/21/18 0804  WBC 18.0*  --  15.4* 26.6* 25.2* 17.2*  NEUTROABS 13.8*  --  14.4*  --   --   --   HGB 12.5* 12.9* 12.2* 11.8* 12.2* 13.0  HCT 38.9* 38.0* 38.5* 37.0* 37.6* 40.9  MCV 93.1  --  94.1 93.2 91.0 91.7  PLT 382  --  344 396 441* 461*   Basic Metabolic Panel: Recent Labs  Lab 12/17/18 0507  12/19/18 0732 12/20/18 0828 12/21/18 0804 12/22/18 0922 12/23/18 0750  NA 133*   < > 140 140 140 140 140  K 3.9   < > 3.8 3.2* 3.2* 3.5 3.7  CL 103   < > 102 101 100 97* 100  CO2 19*   < > 25 26 28 30 30   GLUCOSE 267*   < > 179* 203* 221* 201* 220*  BUN 11   < > 27* 30* 35* 34* 35*  CREATININE 1.15   < > 1.17 1.08 1.09 1.10 1.14  CALCIUM 8.5*   < > 8.8* 8.8* 8.8* 8.9 8.5*  MG 2.2  --   --   --  2.2  --   --    < > = values in this interval not displayed.   GFR: Estimated Creatinine Clearance: 73.1 mL/min (by C-G formula based on SCr of 1.14 mg/dL). Liver Function Tests: Recent Labs  Lab 12/16/18 2320 12/17/18 0507  AST 19 19  ALT 14 15  ALKPHOS 88 85  BILITOT 1.4* 1.3*  PROT 6.9 6.5  ALBUMIN 3.3* 3.1*   No results for input(s): LIPASE, AMYLASE in the last 168 hours. No results for input(s): AMMONIA in the last 168 hours. Coagulation Profile: No results for input(s): INR, PROTIME in the last 168 hours. Cardiac Enzymes: Recent Labs  Lab 12/16/18 2320 12/17/18 0507 12/17/18 1056 12/17/18 1714  TROPONINI 0.06* 0.18* 0.18* 0.17*   BNP (last 3 results) No results for input(s): PROBNP in the  last 8760 hours. HbA1C: Recent Labs    12/22/18 1245  HGBA1C 6.3*   CBG: Recent Labs  Lab 12/22/18 1222 12/22/18 1715 12/22/18 2126 12/23/18 0730 12/23/18 1113  GLUCAP 269* 207* 205* 207* 191*   Lipid Profile: No results for input(s): CHOL, HDL, LDLCALC, TRIG, CHOLHDL, LDLDIRECT in the last 72 hours. Thyroid Function Tests: No results for input(s): TSH, T4TOTAL, FREET4, T3FREE, THYROIDAB in the last 72 hours. Anemia Panel: No results for input(s): VITAMINB12, FOLATE, FERRITIN, TIBC, IRON, RETICCTPCT in the last 72 hours. Sepsis Labs: Recent Labs  Lab  12/17/18 0024  LATICACIDVEN 1.4    Recent Results (from the past 240 hour(s))  SARS Coronavirus 2     Status: None   Collection Time: 12/16/18 11:00 PM  Result Value Ref Range Status   SARS Coronavirus 2 NOT DETECTED NOT DETECTED Final    Comment: (NOTE) SARS-CoV-2 target nucleic acids are NOT DETECTED. The SARS-CoV-2 RNA is generally detectable in upper and lower respiratory specimens during the acute phase of infection.  Negative  results do not preclude SARS-CoV-2 infection, do not rule out co-infections with other pathogens, and should not be used as the sole basis for treatment or other patient management decisions.  Negative results must be combined with clinical observations, patient history, and epidemiological information. The expected result is Not Detected. Fact Sheet for Patients: http://www.biofiredefense.com/wp-content/uploads/2020/03/BIOFIRE-COVID -19-patients.pdf Fact Sheet for Healthcare Providers: http://www.biofiredefense.com/wp-content/uploads/2020/03/BIOFIRE-COVID -19-hcp.pdf This test is not yet approved or cleared by the [place] and  has been authorized for detection and/or diagnosis of SARS-CoV-2 by FDA under an Emergency Use Authorization (EUA).  This EUA will remain in effec t (meaning this test can be used) for the duration of  the COVID-19 declaration under Section 564(b)(1) of  the Act, 21 U.S.C. section 360bbb-3(b)(1), unless the authorization is terminated or revoked sooner. Performed at [facility], [place] [address] [place], [place] [address]   Novel Coronavirus,NAA,(SEND-OUT TO REF LAB - TAT 24-48 hrs); Hosp Order     Status: None   Collection Time: 12/16/18 11:00 PM   Specimen: Nasopharyngeal Swab; Respiratory  Result Value Ref Range Status   SARS-CoV-2, NAA NOT DETECTED NOT DETECTED Final    Comment: (NOTE) This test was developed and its performance characteristics determined by [employer]. This test has not been FDA cleared or approved. This test has been authorized by FDA under an Emergency Use Authorization (EUA). This test is only authorized for the duration of time the declaration that circumstances exist justifying the authorization of the emergency use of in vitro diagnostic tests for detection of SARS-CoV-2 virus and/or diagnosis of COVID-19 infection under section 564(b)(1) of the Act, 21 U.S.C. 540GQQ-7(Y)(1), unless the authorization is terminated or revoked sooner. When diagnostic testing is negative, the possibility of a false negative result should be considered in the context of a patient's recent exposures and the presence of clinical signs and symptoms consistent with COVID-19. An individual without symptoms of COVID-19 and who is not shedding SARS-CoV-2 virus would expect to have a negative (not detected) result in this assay. Performed  At: [facility] [address] [place], [place] [id number] Rush Farmer MD IW:[id number]    [place]  Final    Comment: Performed at [facility], [place] [address] [place], [place] [address]  Blood culture (routine x 2)     Status: None   Collection Time: 12/17/18 12:25 AM   Specimen: BLOOD LEFT ARM  Result Value Ref Range Status   Specimen Description BLOOD LEFT ARM  Final   Special Requests   Final    BOTTLES DRAWN AEROBIC AND  ANAEROBIC Blood Culture adequate volume   Culture   Final    NO GROWTH 5 DAYS Performed at [facility], [place] [address] [place], [place] [address]    Report Status 12/22/2018 FINAL  Final  Blood culture (routine x 2)     Status: None   Collection Time: 12/17/18  1:40 AM   Specimen: BLOOD LEFT ARM  Result Value Ref Range Status   Specimen  Description BLOOD LEFT ARM  Final   Special Requests   Final    BOTTLES DRAWN AEROBIC AND ANAEROBIC Blood Culture adequate volume   Culture   Final    NO GROWTH 5 DAYS Performed at [facility], [address], [place], [place] [address]    Report Status 12/22/2018 FINAL  Final  Respiratory Panel by PCR     Status: None   Collection Time: 12/19/18  7:46 PM   Specimen: Nasopharyngeal Swab; Respiratory  Result Value Ref Range Status   Adenovirus NOT DETECTED NOT DETECTED Final   Coronavirus 229E NOT DETECTED NOT DETECTED Final    Comment: (NOTE) The Coronavirus on the Respiratory Panel, DOES NOT test for the novel  Coronavirus (2019 nCoV)    Coronavirus HKU1 NOT DETECTED NOT DETECTED Final   Coronavirus NL63 NOT DETECTED NOT DETECTED Final   Coronavirus OC43 NOT DETECTED NOT DETECTED Final   Metapneumovirus NOT DETECTED NOT DETECTED Final   Rhinovirus / Enterovirus NOT DETECTED NOT DETECTED Final   Influenza A NOT DETECTED NOT DETECTED Final   Influenza B NOT DETECTED NOT DETECTED Final   Parainfluenza Virus 1 NOT DETECTED NOT DETECTED Final   Parainfluenza Virus 2 NOT DETECTED NOT DETECTED Final   Parainfluenza Virus 3 NOT DETECTED NOT DETECTED Final   Parainfluenza Virus 4 NOT DETECTED NOT DETECTED Final   Respiratory Syncytial Virus NOT DETECTED NOT DETECTED Final   Bordetella pertussis NOT DETECTED NOT DETECTED Final   Chlamydophila pneumoniae NOT DETECTED NOT DETECTED Final   Mycoplasma pneumoniae NOT DETECTED NOT DETECTED Final    Comment: Performed at [facility], [address], [place], [place] [address]          Radiology Studies: No results found.      Scheduled Meds: . aspirin EC  81 mg Oral Daily  . atorvastatin  80 mg Oral q1800  . azithromycin  500 mg Oral QHS  . carvedilol  3.125 mg Oral BID WC  . clonazePAM  1 mg Oral QHS  . enoxaparin (LOVENOX) injection  40 mg Subcutaneous Q24H  . FLUoxetine  60 mg Oral Daily  . furosemide      . [START ON 12/24/2018] furosemide  80 mg Oral Daily  . insulin aspart  0-9 Units Subcutaneous TID WC  . lamoTRIgine  75 mg Oral QHS  . [START ON 12/24/2018] losartan  25 mg Oral Daily  . mouth rinse  15 mL Mouth Rinse BID  . pantoprazole  40 mg Oral Daily  . potassium chloride SA  40 mEq Oral Daily  . predniSONE  40 mg Oral Q breakfast  . spironolactone  12.5 mg Oral Daily  . tamsulosin  0.4 mg Oral QHS  . ticagrelor  90 mg Oral BID   Continuous Infusions: . sodium chloride 10 mL/hr at 12/17/18 1800  . cefTRIAXone (ROCEPHIN)  IV 1 g (12/22/18 1747)     LOS: 6 days    Time spent: 30 minutes     Kathlen Mody, MD [facility] Pager [phone]   If 7PM-7AM, please contact night-coverage [email or web address] Password [email or web address] 12/23/2018, 1:17 PM

## 2018-12-23 NOTE — Progress Notes (Signed)
Physical Therapy Treatment Patient Details Name: Justin Bradford. MRN: [id number] DOB: 1940-11-23 Today's Date: 12/23/2018    History of Present Illness 78 y.o. male with history of recent non-ST elevation MI in April 2020 status post stent placement with systolic heart failure last EF measured last week was 35%, COPD, diabetes mellitus, hypertension presents to the ER because of worsening shortness of breath on exertion over the last 1 week, hypoxic, with elevated BNP, and elevated troponins. CT angio of the chest suspicous for patchy bilateral infiltrates concerning for infection vs edema.    PT Comments    Patient demonstrating slight progress with therapy today, ambulating 23' without physical assistance. Desats to 85% on 2L, remains in 90s on 4L. Pt states he feels well enough to be home, rec HHPT.     Follow Up Recommendations  HHPT     Equipment Recommendations  Rolling walker with 5" wheels;3in1 (PT)    Recommendations for Other Services       Precautions / Restrictions Precautions Precautions: Fall Restrictions Other Position/Activity Restrictions: watch O2    Mobility  Bed Mobility Overal bed mobility: Needs Assistance Bed Mobility: Supine to Sit     Supine to sit: Min guard;HOB elevated;Min assist     General bed mobility comments: Pt reaching for therapist arm to pull up on to power up trunk to EOB position.   Transfers Overall transfer level: Needs assistance Equipment used: Rolling walker (2 wheeled) Transfers: Sit to/from Stand Sit to Stand: Min guard         General transfer comment: to/from EOB and recliner. Min guard for safety. Cues for hand placement  Ambulation/Gait Ambulation/Gait assistance: Min guard Gait Distance (Feet): 50 Feet Assistive device: Rolling walker (2 wheeled) Gait Pattern/deviations: Step-through pattern;Decreased step length - right;Decreased step length - left     General Gait Details: Good use of RW for support;  cues for deep, controlled breathing as his O2 sats dropped to mid to low 80s even with 10L supplemental O2 via HFNC   Stairs             Wheelchair Mobility    Modified Rankin (Stroke Patients Only)       Balance     Sitting balance-[name]: Fair       Standing balance-[name]: Fair Standing balance comment: stood at sink to complete most of oral care task. Intermittently reaching for sink to steady,                             Cognition Arousal/Alertness: Awake/alert Behavior During Therapy: WFL for tasks assessed/performed(hyperverbal) Overall Cognitive Status: No family/caregiver present to determine baseline cognitive functioning                                 General Comments: HOH vs attention deficits?      Exercises      General Comments        Pertinent Vitals/Pain      Home Living                      Prior Function            PT Goals (current goals can now be found in the care plan section) Acute Rehab PT Goals Patient Stated Goal: keep feeling better. "I'd love to be able to volunteer again." PT Goal Formulation: With  patient Time For Goal Achievement: 01/04/19 Potential to Achieve Goals: Good Progress towards PT goals: Progressing toward goals    Frequency    Min 3X/week      PT Plan      Co-evaluation PT/OT/SLP Co-Evaluation/Treatment: Yes            AM-PAC PT "6 Clicks" Mobility   Outcome Measure  Help needed turning from your back to your side while in a flat bed without using bedrails?: A Little Help needed moving from lying on your back to sitting on the side of a flat bed without using bedrails?: A Little Help needed moving to and from a bed to a chair (including a wheelchair)?: A Little Help needed standing up from a chair using your arms (e.g., wheelchair or bedside chair)?: A Little Help needed to walk in hospital room?: A Little Help needed climbing 3-5 steps with  a railing? : A Little 6 Click Score: 18    End of Session Equipment Utilized During Treatment: Gait belt;Oxygen Activity Tolerance: Patient tolerated treatment well Patient left: in chair;with call bell/phone within reach Nurse Communication: Mobility status PT Visit Diagnosis: Unsteadiness on feet (R26.81);Other abnormalities of gait and mobility (R26.89);Difficulty in walking, not elsewhere classified (R26.2)     Time: 1335-1406 PT Time Calculation (min) (ACUTE ONLY): 31 min  Charges:  $Gait Training: 8-22 mins $Therapeutic Activity: 8-22 mins                     Etta Grandchild, PT, DPT Acute Rehabilitation Services Pager: [phone] Office: [phone]     Etta Grandchild 12/23/2018, 2:27 PM

## 2018-12-23 NOTE — Progress Notes (Signed)
Progress Note  Patient Name: Justin Bradford. Date of Encounter: 12/23/2018  Primary Cardiologist:   Norman Herrlich, MD   Subjective   He slept very well last night.  No pain.  No acute SOB.   Inpatient Medications    Scheduled Meds: . aspirin EC  81 mg Oral Daily  . atorvastatin  80 mg Oral q1800  . azithromycin  500 mg Oral QHS  . carvedilol  3.125 mg Oral BID WC  . clonazePAM  1 mg Oral QHS  . enoxaparin (LOVENOX) injection  40 mg Subcutaneous Q24H  . FLUoxetine  60 mg Oral Daily  . furosemide  80 mg Oral BID  . insulin aspart  0-9 Units Subcutaneous TID WC  . lamoTRIgine  75 mg Oral QHS  . losartan  12.5 mg Oral Daily  . mouth rinse  15 mL Mouth Rinse BID  . pantoprazole  40 mg Oral Daily  . potassium chloride SA  40 mEq Oral Daily  . predniSONE  40 mg Oral Q breakfast  . spironolactone  12.5 mg Oral Daily  . tamsulosin  0.4 mg Oral QHS  . ticagrelor  90 mg Oral BID   Continuous Infusions: . sodium chloride 10 mL/hr at 12/17/18 1800  . cefTRIAXone (ROCEPHIN)  IV 1 g (12/22/18 1747)   PRN Meds: sodium chloride, acetaminophen **OR** acetaminophen, albuterol, ondansetron **OR** ondansetron (ZOFRAN) IV   Vital Signs    Vitals:   12/22/18 2006 12/22/18 2326 12/23/18 0323 12/23/18 0731  BP: 120/70 (!) 73/58 115/68 104/78  Pulse: 74 72 72 71  Resp: 20 17 19 18   Temp: 97.8 F (36.6 C) 97.8 F (36.6 C) (!) 97.3 F (36.3 C) 97.6 F (36.4 C)  TempSrc: Oral  Oral Oral  SpO2: 98% 97% 96% 99%  Weight:      Height:        Intake/Output Summary (Last 24 hours) at 12/23/2018 0815 Last data filed at 12/23/2018 0600 Gross per 24 hour  Intake -  Output 2325 ml  Net -2325 ml   Filed Weights   12/20/18 0518 12/21/18 0600 12/22/18 0518  Weight: 120.6 kg 115.5 kg 117.9 kg    Telemetry    NSR - Personally Reviewed  ECG    NA - Personally Reviewed  Physical Exam   GEN: No  acute distress.   Neck: No  JVD Cardiac: RRR, no murmurs, rubs, or gallops.   Respiratory: Clear   to auscultation bilaterally. GI: Soft, nontender, non-distended, normal bowel sounds  MS:  No edema; No deformity. Neuro:   Nonfocal  Psych: Oriented and appropriate    Labs    Chemistry Recent Labs  Lab 12/16/18 2320  12/17/18 0507  12/20/18 0828 12/21/18 0804 12/22/18 0922  NA 134*   < > 133*   < > 140 140 140  K 4.3   < > 3.9   < > 3.2* 3.2* 3.5  CL 104  --  103   < > 101 100 97*  CO2 16*  --  19*   < > 26 28 30   GLUCOSE 160*  --  267*   < > 203* 221* 201*  BUN 10  --  11   < > 30* 35* 34*  CREATININE 0.97  --  1.15   < > 1.08 1.09 1.10  CALCIUM 8.6*  --  8.5*   < > 8.8* 8.8* 8.9  PROT 6.9  --  6.5  --   --   --   --  ALBUMIN 3.3*  --  3.1*  --   --   --   --   AST 19  --  19  --   --   --   --   ALT 14  --  15  --   --   --   --   ALKPHOS 88  --  85  --   --   --   --   BILITOT 1.4*  --  1.3*  --   --   --   --   GFRNONAA >60  --  >60   < > >60 >60 >60  GFRAA >60  --  >60   < > >60 >60 >60  ANIONGAP 14  --  11   < > 13 12 13    < > = values in this interval not displayed.     Hematology Recent Labs  Lab 12/18/18 1026 12/19/18 0732 12/21/18 0804  WBC 26.6* 25.2* 17.2*  RBC 3.97* 4.13* 4.46  HGB 11.8* 12.2* 13.0  HCT 37.0* 37.6* 40.9  MCV 93.2 91.0 91.7  MCH 29.7 29.5 29.1  MCHC 31.9 32.4 31.8  RDW 14.7 14.8 14.5  PLT 396 441* 461*    Cardiac Enzymes Recent Labs  Lab 12/16/18 2320 12/17/18 0507 12/17/18 1056 12/17/18 1714  TROPONINI 0.06* 0.18* 0.18* 0.17*   No results for input(s): TROPIPOC in the last 168 hours.   BNP Recent Labs  Lab 12/16/18 2321  BNP 916.8*     DDimer No results for input(s): DDIMER in the last 168 hours.   Radiology    No results found.  Cardiac Studies   Echocardiogram 10/2018:  1. The left ventricle has moderate-severely reduced systolic function, with an ejection fraction of 30-35%. The cavity size was normal. Left ventricular diastolic function could not be evaluated due to  nondiagnostic images. 2. No evidence of LV thrombus by definity contrast. 3. The right ventricle has normal systolc function. The cavity was normal. There is no increase in right ventricular wall thickness. Right ventricular systolic pressure could not be assessed. 4. The aortic valve is tricuspid Moderate sclerosis of the aortic valve.  Patient Profile     78 y.o. 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 Karleen Hampshire.  Assessment & Plan    ACUTE ON CHRONIC SYSTOLIC HF:    Negative 33.29 liters this admission.  Changed to PO Lasix yesterday. I am going to change to once daily.  BP is still soft but I will try to very gently creep up on his Cozaar.    HYPOKALEMIA:    Continue to supplement.    CAD:  No evidence of ACS. Medical management.  HTN:  BP is controlled.  Meds as above.   PAF:  Amiodarone discontinued this admission.  Still maintaining NSR.  For questions or updates, please contact [place] Please consult [email or web address] for contact info under Cardiology/STEMI.   Signed, Minus Breeding, MD  12/23/2018, 8:15 AM

## 2018-12-24 LAB — GLUCOSE, CAPILLARY
Glucose-Capillary: 136 mg/dL — ABNORMAL HIGH (ref 70–99)
Glucose-Capillary: 220 mg/dL — ABNORMAL HIGH (ref 70–99)
Glucose-Capillary: 147 mg/dL — ABNORMAL HIGH (ref 70–99)
Glucose-Capillary: 148 mg/dL — ABNORMAL HIGH (ref 70–99)

## 2018-12-24 LAB — BASIC METABOLIC PANEL
Anion gap: 10 (ref 5–15)
BUN: 32 mg/dL — ABNORMAL HIGH (ref 8–23)
CO2: 31 mmol/L (ref 22–32)
Calcium: 8.6 mg/dL — ABNORMAL LOW (ref 8.9–10.3)
Chloride: 100 mmol/L (ref 98–111)
Creatinine, Ser: 0.93 mg/dL (ref 0.61–1.24)
GFR calc Af Amer: >60 mL/min (ref >60)
GFR calc non Af Amer: >60 mL/min (ref >60)
Glucose, Bld: 146 mg/dL — ABNORMAL HIGH (ref 70–99)
Potassium: 3.8 mmol/L (ref 3.5–5.1)
Sodium: 141 mmol/L (ref 135–145)

## 2018-12-24 LAB — CBC
HCT: 41.1 % (ref 39.0–52.0)
Hemoglobin: 13 g/dL (ref 13.0–17.0)
MCH: 29.3 pg (ref 26.0–34.0)
MCHC: 31.6 g/dL (ref 30.0–36.0)
MCV: 92.8 fL (ref 80.0–100.0)
Platelets: 423 10*3/uL — ABNORMAL HIGH (ref 150–400)
RBC: 4.43 MIL/uL (ref 4.22–5.81)
RDW: 14.5 % (ref 11.5–15.5)
WBC: 19.6 10*3/uL — ABNORMAL HIGH (ref 4.0–10.5)
nRBC: 0 % (ref 0.0–0.2)

## 2018-12-24 NOTE — Progress Notes (Signed)
Justin Kitchen  PROGRESS NOTE    Justin Bradford.  CZY:[id number] DOB: 01-12-1941 DOA: 12/16/2018 PCP: Charleston Poot, MD   Brief Narrative:   Justin Bradfordis a 78 yBradfordoBradfordmalewithhistory of recent non-ST elevation MI in April 2020 status post stent placement with systolic heart failure last EF measured last week was 35%, COPD, diabetes mellitus, hypertension presents to the ER because of worsening shortness of breath on exertion over the last 1 week, hypoxic, with elevated BNP, and elevated troponins. CT angio of the chest suspicous for patchy bilateral infiltrates concerning for infection vs edema.   His COVID inpatient test was negative.  He was started on Broad spectrum IV antibiotics, and IV lasix for diuresis and IV steroids.  Repeat NOVEL Coronavirus test negative.  His breathing has improved, transitioned to oral lasix, discontinue antibiotics on 6/23 and plan for discharge in 24 to 48 hours.    Assessment & Plan:   Principal Problem:   Acute respiratory failure with hypoxia (HCC) Active Problems:   COPD (chronic obstructive pulmonary disease) (HCC)   Diabetes mellitus type 2 in obese (HCC)   Chronic systolic heart failure (HCC)   CAD in native artery   PAF (paroxysmal atrial fibrillation) ([place])   CAP (community acquired pneumonia)   Acute respiratory distress   Acute respiratory failure with hypoxia     - covid 19 test is negative     - started the patient on IV rocephin and zithromax, IV steroids and bronchodilators.      - completed 7 day rocephin course     - PO lasix 80 mg  daily. Diuresed about 10Bradford5L since admission          - long steroid taper     - Appreciate PCCM recommendations.      - Repeat CXR shows improvement in the the bilateral infiltrates.      - Respiratory panel sent and negative.   CAD     - Pt on aspirin , statin, coreg, and losartan.      - Cardiology restarted the [place].      - He denies any chest pain at this time.   Type 2 DM     -  Resume SSI while inpatient.      - Tapering the steroids.     - novolog 3 units TIDAC.  Anxiety and depression:     - prozac, klonopin   Paroxysmal atrial fibrillation      - coreg, ASA   DVT prophylaxis: lovenox Code Status: FULL   Disposition Plan: TBD   Consultants:   GI    Subjective: "I'm feeling better."  Objective: Vitals:   12/23/18 1919 12/23/18 2307 12/24/18 0500 12/24/18 0758  BP: (!) 105/58 121/74 109/62 113/72  Pulse: 74 68 67 69  Resp: 19 17  19   Temp: 98Bradford3 F (36Bradford8 C) 97Bradford8 F (36Bradford6 C)  (!) 97Bradford5 F (36Bradford4 C)  TempSrc: Axillary Axillary  Oral  SpO2: 97% 95% 94% 95%  Weight:   119Bradford3 kg   Height:        Intake/Output Summary (Last 24 hours) at 12/24/2018 1523 Last data filed at 12/24/2018 1151 Gross per 24 hour  Intake 240 ml  Output 950 ml  Net -710 ml   Filed Weights   12/21/18 0600 12/22/18 0518 12/24/18 0500  Weight: 115Bradford5 kg 117Bradford9 kg 119Bradford3 kg    Examination:  General: 78 yBradfordo. male resting in bed in NAD Cardiovascular: RRR, +S1, S2, no m/g/r, equal pulses  throughout Respiratory: CTABL, no w/r/r, somewhat increased WOB GI: BS+, NDNT, no masses noted, no organomegaly noted MSK: No e/c/c Skin: No rashes, bruises, ulcerations noted Neuro: A&O x 3, no focal deficits   Data Reviewed: I have personally reviewed following labs and imaging studies.  CBC: Recent Labs  Lab 12/18/18 1026 12/19/18 0732 12/21/18 0804 12/24/18 0526  WBC 26Bradford6* 25Bradford2* 17Bradford2* 19Bradford6*  HGB 11Bradford8* 12Bradford2* 13Bradford0 13Bradford0  HCT 37Bradford0* 37Bradford6* 40Bradford9 41Bradford1  MCV 93Bradford2 91Bradford0 91Bradford7 92Bradford8  PLT 396 441* 461* 423*   Basic Metabolic Panel: Recent Labs  Lab 12/20/18 0828 12/21/18 0804 12/22/18 0922 12/23/18 0750 12/24/18 0526  NA 140 140 140 140 141  K 3Bradford2* 3Bradford2* 3Bradford5 3Bradford7 3Bradford8  CL 101 100 97* 100 100  CO2 26 28 30 30 31   GLUCOSE 203* 221* 201* 220* 146*  BUN 30* 35* 34* 35* 32*  CREATININE 1Bradford08 1Bradford09 1Bradford10 1Bradford14 0Bradford93  CALCIUM 8Bradford8* 8Bradford8* 8Bradford9 8Bradford5* 8Bradford6*  MG  --  2Bradford2  --   --   --    GFR:  Estimated Creatinine Clearance: 90Bradford1 mL/min (by C-G formula based on SCr of 0Bradford93 mg/dL). Liver Function Tests: No results for input(s): AST, ALT, ALKPHOS, BILITOT, PROT, ALBUMIN in the last 168 hours. No results for input(s): LIPASE, AMYLASE in the last 168 hours. No results for input(s): AMMONIA in the last 168 hours. Coagulation Profile: No results for input(s): INR, PROTIME in the last 168 hours. Cardiac Enzymes: Recent Labs  Lab 12/17/18 1714  TROPONINI 0Bradford17*   BNP (last 3 results) No results for input(s): PROBNP in the last 8760 hours. HbA1C: Recent Labs    12/22/18 1245  HGBA1C 6Bradford3*   CBG: Recent Labs  Lab 12/23/18 1113 12/23/18 1641 12/23/18 2136 12/24/18 0800 12/24/18 1147  GLUCAP 191* 164* 269* 136* 220*   Lipid Profile: No results for input(s): CHOL, HDL, LDLCALC, TRIG, CHOLHDL, LDLDIRECT in the last 72 hours. Thyroid Function Tests: No results for input(s): TSH, T4TOTAL, FREET4, T3FREE, THYROIDAB in the last 72 hours. Anemia Panel: No results for input(s): VITAMINB12, FOLATE, FERRITIN, TIBC, IRON, RETICCTPCT in the last 72 hours. Sepsis Labs: No results for input(s): PROCALCITON, LATICACIDVEN in the last 168 hours.  Recent Results (from the past 240 hour(s))  SARS Coronavirus 2     Status: None   Collection Time: 12/16/18 11:00 PM  Result Value Ref Range Status   SARS Coronavirus 2 NOT DETECTED NOT DETECTED Final    Comment: (NOTE) SARS-CoV-2 target nucleic acids are NOT DETECTED. The SARS-CoV-2 RNA is generally detectable in upper and lower respiratory specimens during the acute phase of infection.  Negative  results do not preclude SARS-CoV-2 infection, do not rule out co-infections with other pathogens, and should not be used as the sole basis for treatment or other patient management decisions.  Negative results must be combined with clinical observations, patient history, and epidemiological information. The expected result is Not Detected. Fact  Sheet for Patients: http://wwwBradfordbiofiredefenseBradfordcom/wp-content/uploads/2020/03/BIOFIRE-COVID -19-patientsBradfordpdf Fact Sheet for Healthcare Providers: http://wwwBradfordbiofiredefenseBradfordcom/wp-content/uploads/2020/03/BIOFIRE-COVID -19-hcpBradfordpdf This test is not yet approved or cleared by the [place] and  has been authorized for detection and/or diagnosis of SARS-CoV-2 by FDA under an Emergency Use Authorization (EUA).  This EUA will remain in effec t (meaning this test can be used) for the duration of  the COVID-19 declaration under Section 564(b)(1) of the Act, 21 UBradfordSBradfordC. section 360bbb-3(b)(1), unless the authorization is terminated or revoked sooner. Performed at [facility], [address] [place], [place]  [id number]   Novel Coronavirus,NAA,(SEND-OUT TO REF LAB - TAT 24-48 hrs); Hosp Order     Status: None   Collection Time: 12/16/18 11:00 PM   Specimen: Nasopharyngeal Swab; Respiratory  Result Value Ref Range Status   SARS-CoV-2, NAA NOT DETECTED NOT DETECTED Final    Comment: (NOTE) This test was developed and its performance characteristics determined by [facility]. This test has not been FDA cleared or approved. This test has been authorized by FDA under an Emergency Use Authorization (EUA). This test is only authorized for the duration of time the declaration that circumstances exist justifying the authorization of the emergency use of in vitro diagnostic tests for detection of SARS-CoV-2 virus and/or diagnosis of COVID-19 infection under section 564(b)(1) of the Act, 21 UBradfordSBradfordC. 604VWU-9(W)(1360bbb-3(b)(1), unless the authorization is terminated or revoked sooner. When diagnostic testing is negative, the possibility of a false negative result should be considered in the context of a patient's recent exposures and the presence of clinical signs and symptoms consistent with COVID-19. An individual without symptoms of COVID-19 and who is not shedding SARS-CoV-2 virus would expect  to have a negative (not detected) result in this assay. Performed  At: [facility] [address] [place], [place] [id number] Jolene SchimkeNagendra Sanjai MD AO:[id number]:[phone]    Coronavirus Source NASOPHARYNGEAL  Final    Comment: Performed at [facility], [address] [place], [place] [address]  Blood culture (routine x 2)     Status: None   Collection Time: 12/17/18 12:25 AM   Specimen: BLOOD LEFT ARM  Result Value Ref Range Status   Specimen Description BLOOD LEFT ARM  Final   Special Requests   Final    BOTTLES DRAWN AEROBIC AND ANAEROBIC Blood Culture adequate volume   Culture   Final    NO GROWTH 5 DAYS Performed at [facility], [address] [place], [place] [address]    Report Status 12/22/2018 FINAL  Final  Blood culture (routine x 2)     Status: None   Collection Time: 12/17/18  1:40 AM   Specimen: BLOOD LEFT ARM  Result Value Ref Range Status   Specimen Description BLOOD LEFT ARM  Final   Special Requests   Final    BOTTLES DRAWN AEROBIC AND ANAEROBIC Blood Culture adequate volume   Culture   Final    NO GROWTH 5 DAYS Performed at [facility], [address] [place], [place] [address]    Report Status 12/22/2018 FINAL  Final  Respiratory Panel by PCR     Status: None   Collection Time: 12/19/18  7:46 PM   Specimen: Nasopharyngeal Swab; Respiratory  Result Value Ref Range Status   Adenovirus NOT DETECTED NOT DETECTED Final   Coronavirus 229E NOT DETECTED NOT DETECTED Final    Comment: (NOTE) The Coronavirus on the Respiratory Panel, DOES NOT test for the novel  Coronavirus (2019 nCoV)    Coronavirus HKU1 NOT DETECTED NOT DETECTED Final   Coronavirus NL63 NOT DETECTED NOT DETECTED Final   Coronavirus OC43 NOT DETECTED NOT DETECTED Final   Metapneumovirus NOT DETECTED NOT DETECTED Final   Rhinovirus / Enterovirus NOT DETECTED NOT DETECTED Final   Influenza A NOT DETECTED NOT DETECTED Final   Influenza B NOT DETECTED NOT DETECTED Final    Parainfluenza Virus 1 NOT DETECTED NOT DETECTED Final   Parainfluenza Virus 2 NOT DETECTED NOT DETECTED Final   Parainfluenza Virus 3 NOT DETECTED NOT DETECTED Final   Parainfluenza Virus 4  NOT DETECTED NOT DETECTED Final   Respiratory Syncytial Virus NOT DETECTED NOT DETECTED Final   Bordetella pertussis NOT DETECTED NOT DETECTED Final   Chlamydophila pneumoniae NOT DETECTED NOT DETECTED Final   Mycoplasma pneumoniae NOT DETECTED NOT DETECTED Final    Comment: Performed at [facility], [address] [place], [place] [address]         Radiology Studies: No results found.      Scheduled Meds: . aspirin EC  81 mg Oral Daily  . atorvastatin  80 mg Oral q1800  . carvedilol  3Bradford125 mg Oral BID WC  . clonazePAM  1 mg Oral QHS  . enoxaparin (LOVENOX) injection  40 mg Subcutaneous Q24H  . FLUoxetine  60 mg Oral Daily  . furosemide  80 mg Oral Daily  . insulin aspart  0-9 Units Subcutaneous TID WC  . insulin aspart  3 Units Subcutaneous TID WC  . lamoTRIgine  75 mg Oral QHS  . losartan  25 mg Oral Daily  . mouth rinse  15 mL Mouth Rinse BID  . pantoprazole  40 mg Oral Daily  . potassium chloride SA  40 mEq Oral Daily  . predniSONE  40 mg Oral Q breakfast  . spironolactone  12Bradford5 mg Oral Daily  . tamsulosin  0Bradford4 mg Oral QHS  . ticagrelor  90 mg Oral BID   Continuous Infusions: . sodium chloride 10 mL/hr at 12/17/18 1800     LOS: 7 days    Time spent: 25 minutes spent in the coordination of care today.     Teddy Spikeyrone A Paysen Goza, DO [facility] Pager [phone]  If 7PM-7AM, please contact night-coverage [email or web address] Password [email or web address] 12/24/2018, 3:23 PM

## 2018-12-24 NOTE — Progress Notes (Signed)
Progress Note  Patient Name: Justin Bradford. Date of Encounter: 12/24/2018  Primary Cardiologist:   Shirlee More, MD   Subjective   Feels well.  He wants to go home.  Note that he ambulated slightly and required 4 liters O2.  No complaints of SOB or pain.   Inpatient Medications    Scheduled Meds: . aspirin EC  81 mg Oral Daily  . atorvastatin  80 mg Oral q1800  . carvedilol  3.125 mg Oral BID WC  . clonazePAM  1 mg Oral QHS  . enoxaparin (LOVENOX) injection  40 mg Subcutaneous Q24H  . FLUoxetine  60 mg Oral Daily  . furosemide  80 mg Oral Daily  . insulin aspart  0-9 Units Subcutaneous TID WC  . insulin aspart  3 Units Subcutaneous TID WC  . lamoTRIgine  75 mg Oral QHS  . losartan  25 mg Oral Daily  . mouth rinse  15 mL Mouth Rinse BID  . pantoprazole  40 mg Oral Daily  . potassium chloride SA  40 mEq Oral Daily  . predniSONE  40 mg Oral Q breakfast  . spironolactone  12.5 mg Oral Daily  . tamsulosin  0.4 mg Oral QHS  . ticagrelor  90 mg Oral BID   Continuous Infusions: . sodium chloride 10 mL/hr at 12/17/18 1800   PRN Meds: sodium chloride, acetaminophen **OR** acetaminophen, albuterol, ondansetron **OR** ondansetron (ZOFRAN) IV   Vital Signs    Vitals:   12/23/18 1919 12/23/18 2307 12/24/18 0500 12/24/18 0758  BP: (!) 105/58 121/74 109/62 113/72  Pulse: 74 68 67 69  Resp: 19 17  19   Temp: 98.3 F (36.8 C) 97.8 F (36.6 C)  (!) 97.5 F (36.4 C)  TempSrc: Axillary Axillary  Oral  SpO2: 97% 95% 94% 95%  Weight:   119.3 kg   Height:        Intake/Output Summary (Last 24 hours) at 12/24/2018 0937 Last data filed at 12/24/2018 0300 Gross per 24 hour  Intake -  Output 650 ml  Net -650 ml   Filed Weights   12/21/18 0600 12/22/18 0518 12/24/18 0500  Weight: 115.5 kg 117.9 kg 119.3 kg    Telemetry    NSR - Personally Reviewed  ECG    NA - Personally Reviewed  Physical Exam   GEN: No  acute distress.   Neck: No  JVD Cardiac: RRR, no  murmurs, rubs, or gallops.  Respiratory: Clear   to auscultation bilaterally. GI: Soft, nontender, non-distended, normal bowel sounds  MS:   No edema; No deformity. Neuro:   Nonfocal  Psych: Oriented and appropriate    Labs    Chemistry Recent Labs  Lab 12/22/18 0922 12/23/18 0750 12/24/18 0526  NA 140 140 141  K 3.5 3.7 3.8  CL 97* 100 100  CO2 30 30 31   GLUCOSE 201* 220* 146*  BUN 34* 35* 32*  CREATININE 1.10 1.14 0.93  CALCIUM 8.9 8.5* 8.6*  GFRNONAA >60 >60 >60  GFRAA >60 >60 >60  ANIONGAP 13 10 10      Hematology Recent Labs  Lab 12/19/18 0732 12/21/18 0804 12/24/18 0526  WBC 25.2* 17.2* 19.6*  RBC 4.13* 4.46 4.43  HGB 12.2* 13.0 13.0  HCT 37.6* 40.9 41.1  MCV 91.0 91.7 92.8  MCH 29.5 29.1 29.3  MCHC 32.4 31.8 31.6  RDW 14.8 14.5 14.5  PLT 441* 461* 423*    Cardiac Enzymes Recent Labs  Lab 12/17/18 1056 12/17/18 1714  TROPONINI 0.18*  0.17*   No results for input(s): TROPIPOC in the last 168 hours.   BNP No results for input(s): BNP, PROBNP in the last 168 hours.   DDimer No results for input(s): DDIMER in the last 168 hours.   Radiology    No results found.  Cardiac Studies   Echocardiogram 10/2018:  1. The left ventricle has moderate-severely reduced systolic function, with an ejection fraction of 30-35%. The cavity size was normal. Left ventricular diastolic function could not be evaluated due to nondiagnostic images. 2. No evidence of LV thrombus by definity contrast. 3. The right ventricle has normal systolc function. The cavity was normal. There is no increase in right ventricular wall thickness. Right ventricular systolic pressure could not be assessed. 4. The aortic valve is tricuspid Moderate sclerosis of the aortic valve.  Patient Profile     78 y.o. male with PMH of CAD s/p NSTEMI 10/2018 with PCI/DES to LAD, chronic combined CHF (EF 35% 10/2018), HTN, HLD, DM type 2, COPD,who is being seen for the evaluation of DOE and  elevated troponinsat the request of Dr. Blake Divine.  Assessment & Plan    ACUTE ON CHRONIC SYSTOLIC HF:    Negative 10.5 liters this admission.  Changed to PO Lasix .   Increased Cozaar slightly.  No change in meds.  We will follow as needed.    .   CAD:   Medical management.     HTN:  BP is controlled. Continue meds as listed.   PAF:  Amiodarone discontinued this admission.  Maintaining NSR.  Will discontinue tele  For questions or updates, please contact [facility] Please consult [email or web address] for contact info under Cardiology/STEMI.   Signed, Rollene Rotunda, MD  12/24/2018, 9:37 AM

## 2018-12-24 NOTE — Progress Notes (Signed)
Occupational Therapy Treatment Patient Details Name: Justin Bradford. MRN: [id number] DOB: March 01, 1941 Today's Date: 12/24/2018    History of present illness 78 y.o. male with history of recent non-ST elevation MI in April 2020 status post stent placement with systolic heart failure last EF measured last week was 35%, COPD, diabetes mellitus, hypertension presents to the ER because of worsening shortness of breath on exertion over the last 1 week, hypoxic, with elevated BNP, and elevated troponins. CT angio of the chest suspicous for patchy bilateral infiltrates concerning for infection vs edema.   OT comments  Treatment session with focus on increased activity tolerance and endurance to complete self-care tasks.  Pt donned pants with supervision at sit > stand level demonstrating good safety awareness.  Completed toilet transfers ambulating with RW with supervision.  Utilized BSC over toilet to elevate toilet seat as pt reports having elevated toilet seat with arms.  Grooming tasks completed in standing, O2 sats dropped to 82% on 3L O2 post ambulating and toilet transfers.  Encouraged to sit, pt sats returned to low 90s with <1 mins rest break.  Educated on breathing technique and monitoring symptoms during activity.  Pt pleased with progress and reports ready to go home when medically stable.  Follow Up Recommendations  Home health OT;Supervision - Intermittent    Equipment Recommendations  None recommended by OT       Precautions / Restrictions Precautions Precautions: Fall Precaution Comments: watch O2       Mobility Bed Mobility  Pt received up in recliner                Transfers Overall transfer level: Needs assistance Equipment used: Rolling walker (2 wheeled) Transfers: Sit to/from Stand Sit to Stand: Supervision                  ADL either performed or assessed with clinical judgement   ADL Overall ADL's : Needs assistance/impaired Eating/Feeding:  Independent;Sitting   Grooming: Oral care;Standing;Wash/dry hands;Supervision/safety   Upper Body Bathing: Set up;Sitting   Lower Body Bathing: Set up;Sit to/from stand   Upper Body Dressing : Set up;Sitting   Lower Body Dressing: Supervision/safety;Sit to/from stand   Toilet Transfer: Supervision/safety;Ambulation;RW;Grab bars   Toileting- Clothing Manipulation and Hygiene: Supervision/safety;Sit to/from stand       Functional mobility during ADLs: Supervision/safety General ADL Comments: Pt completed LB dressing, grooming in standing, and toilet transfers at overall Supervision level.  Pt O2 sats dropped to 82% on 3L after ambulation to/from toilet.  Quickly rebounded to low 90s with seated rest break (<1 min).               Cognition Arousal/Alertness: Awake/alert Behavior During Therapy: WFL for tasks assessed/performed Overall Cognitive Status: No family/caregiver present to determine baseline cognitive functioning                                                     Pertinent Vitals/ Pain       Pain Assessment: No/denies pain         Frequency  Min 2X/week        Progress Toward Goals  OT Goals(current goals can now be found in the care plan section)  Progress towards OT goals: Progressing toward goals  Acute Rehab OT Goals Patient Stated Goal: keep feeling better. "I'd love to  be able to volunteer again." OT Goal Formulation: With patient Time For Goal Achievement: 01/04/19 Potential to Achieve Goals: Good  Plan Discharge plan remains appropriate       AM-PAC OT "6 Clicks" Daily Activity     Outcome Measure   Help from another person eating meals?: None Help from another person taking care of personal grooming?: None Help from another person toileting, which includes using toliet, bedpan, or urinal?: None Help from another person bathing (including washing, rinsing, drying)?: A Little Help from another person to put on and  taking off regular upper body clothing?: None Help from another person to put on and taking off regular lower body clothing?: A Little 6 Click Score: 22    End of Session Equipment Utilized During Treatment: Gait belt;Rolling walker;Oxygen  OT Visit Diagnosis: Unsteadiness on feet (R26.81);Muscle weakness (generalized) (M62.81)   Activity Tolerance Patient tolerated treatment well;Other (comment)(O2 dropped with functional mobility on 3L O2)   Patient Left in chair;with call bell/phone within reach   Nurse Communication Mobility status        Time: 9373-4287 OT Time Calculation (min): 28 min  Charges: OT General Charges $OT Visit: 1 Visit OT Treatments $Self Care/Home Management : 23-37 mins    Simonne Come, [phone] 12/24/2018, 9:42 AM

## 2018-12-24 NOTE — Progress Notes (Signed)
Physical Therapy Treatment Patient Details Name: Justin Bradford. MRN: [id number] DOB: Feb 26, 1941 Today's Date: 12/24/2018    History of Present Illness 78 y.o. male with history of recent non-ST elevation MI in April 2020 status post stent placement with systolic heart failure last EF measured last week was 35%, COPD, diabetes mellitus, hypertension presents to the ER because of worsening shortness of breath on exertion over the last 1 week, hypoxic, with elevated BNP, and elevated troponins. CT angio of the chest suspicous for patchy bilateral infiltrates concerning for infection vs edema.    PT Comments    Patient progressing with therapy today, increasing independence with OOB mobility. Patient hovers around SpO2 90% on RA at rest, desats to 85% with DOE 2/4 with 50' of gait. Discussed safety concerns for limited activity tolerance with patient, he verbalizes understanding. SpO2 WNL on 2L today. Cont rec HHPT.     Follow Up Recommendations  HHPT     Equipment Recommendations  Rolling walker with 5" wheels;3in1 (PT)    Recommendations for Other Services       Precautions / Restrictions Precautions Precautions: Fall Precaution Comments: watch O2 Restrictions Other Position/Activity Restrictions: watch O2    Mobility  Bed Mobility Overal bed mobility: Needs Assistance Bed Mobility: Supine to Sit     Supine to sit: Min guard;HOB elevated;Min assist     General bed mobility comments: Pt reaching for therapist arm to pull up on to power up trunk to EOB position.   Transfers Overall transfer level: Needs assistance Equipment used: Rolling walker (2 wheeled) Transfers: Sit to/from Stand Sit to Stand: Min guard         General transfer comment: to/from EOB and recliner. Min guard for safety. Cues for hand placement  Ambulation/Gait Ambulation/Gait assistance: Supervision Gait Distance (Feet): 60 Feet Assistive device: Rolling walker (2 wheeled) Gait  Pattern/deviations: Step-through pattern;Decreased step length - right;Decreased step length - left Gait velocity: decreased   General Gait Details: supervision today, SpO2 to 85% on RA, DOE 2/4. Remains above 90 with 2L   Stairs             Wheelchair Mobility    Modified Rankin (Stroke Patients Only)       Balance Overall balance assessment: Needs assistance   Sitting balance-[name]: Fair       Standing balance-[name]: Fair Standing balance comment: stood at sink to complete most of oral care task. Intermittently reaching for sink to steady,                             Cognition Arousal/Alertness: Awake/alert Behavior During Therapy: WFL for tasks assessed/performed(hyperverbal) Overall Cognitive Status: No family/caregiver present to determine baseline cognitive functioning                                 General Comments: HOH vs attention deficits?      Exercises      General Comments        Pertinent Vitals/Pain Pain Assessment: No/denies pain    Home Living                      Prior Function            PT Goals (current goals can now be found in the care plan section) Acute Rehab PT Goals Patient Stated Goal: keep feeling better. 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 volunteer again." PT Goal Formulation: With patient Time For Goal Achievement: 01/04/19 Potential to Achieve Goals: Good    Frequency    Min 3X/week      PT Plan      Co-evaluation PT/OT/SLP Co-Evaluation/Treatment: Yes            AM-PAC PT "6 Clicks" Mobility   Outcome Measure  Help needed turning from your back to your side while in a flat bed without using bedrails?: A Little Help needed moving from lying on your back to sitting on the side of a flat bed without using bedrails?: A Little Help needed moving to and from a bed to a chair (including a wheelchair)?: A Little Help needed standing up from a chair using your arms  (e.g., wheelchair or bedside chair)?: A Little Help needed to walk in hospital room?: A Little Help needed climbing 3-5 steps with a railing? : A Little 6 Click Score: 18    End of Session Equipment Utilized During Treatment: Gait belt;Oxygen Activity Tolerance: Patient tolerated treatment well Patient left: in chair;with call bell/phone within reach Nurse Communication: Mobility status PT Visit Diagnosis: Unsteadiness on feet (R26.81);Other abnormalities of gait and mobility (R26.89);Difficulty in walking, not elsewhere classified (R26.2)     Time: 1000-1035 PT Time Calculation (min) (ACUTE ONLY): 35 min  Charges:  $Gait Training: 8-22 mins $Therapeutic Activity: 8-22 mins                     Etta Grandchild, PT, DPT Acute Rehabilitation Services Pager: [phone] Office: [phone]     Etta Grandchild 12/24/2018, 11:01 AM

## 2018-12-25 LAB — CBC WITH DIFFERENTIAL/PLATELET
Abs Immature Granulocytes: 0.37 10*3/uL — ABNORMAL HIGH (ref 0.00–0.07)
Basophils Absolute: 0 10*3/uL (ref 0.0–0.1)
Basophils Relative: 0 %
Eosinophils Absolute: 0.3 10*3/uL (ref 0.0–0.5)
Eosinophils Relative: 2 %
HCT: 41.3 % (ref 39.0–52.0)
Hemoglobin: 13.2 g/dL (ref 13.0–17.0)
Immature Granulocytes: 2 %
Lymphocytes Relative: 16 %
Lymphs Abs: 2.6 10*3/uL (ref 0.7–4.0)
MCH: 29.5 pg (ref 26.0–34.0)
MCHC: 32 g/dL (ref 30.0–36.0)
MCV: 92.4 fL (ref 80.0–100.0)
Monocytes Absolute: 1.3 10*3/uL — ABNORMAL HIGH (ref 0.1–1.0)
Monocytes Relative: 8 %
Neutro Abs: 11.9 10*3/uL — ABNORMAL HIGH (ref 1.7–7.7)
Neutrophils Relative %: 72 %
Platelets: 410 10*3/uL — ABNORMAL HIGH (ref 150–400)
RBC: 4.47 MIL/uL (ref 4.22–5.81)
RDW: 14.4 % (ref 11.5–15.5)
WBC: 16.5 10*3/uL — ABNORMAL HIGH (ref 4.0–10.5)
nRBC: 0 % (ref 0.0–0.2)

## 2018-12-25 LAB — MAGNESIUM: Magnesium: 2.2 mg/dL (ref 1.7–2.4)

## 2018-12-25 LAB — RENAL FUNCTION PANEL
Albumin: 2.6 g/dL — ABNORMAL LOW (ref 3.5–5.0)
Anion gap: 8 (ref 5–15)
BUN: 30 mg/dL — ABNORMAL HIGH (ref 8–23)
CO2: 31 mmol/L (ref 22–32)
Calcium: 8.5 mg/dL — ABNORMAL LOW (ref 8.9–10.3)
Chloride: 100 mmol/L (ref 98–111)
Creatinine, Ser: 1.05 mg/dL (ref 0.61–1.24)
GFR calc Af Amer: >60 mL/min (ref >60)
GFR calc non Af Amer: >60 mL/min (ref >60)
Glucose, Bld: 127 mg/dL — ABNORMAL HIGH (ref 70–99)
Phosphorus: 3.2 mg/dL (ref 2.5–4.6)
Potassium: 3.8 mmol/L (ref 3.5–5.1)
Sodium: 139 mmol/L (ref 135–145)

## 2018-12-25 LAB — LACTIC ACID, PLASMA: Lactic Acid, Venous: 0.9 mmol/L (ref 0.5–1.9)

## 2018-12-25 LAB — GLUCOSE, CAPILLARY
Glucose-Capillary: 111 mg/dL — ABNORMAL HIGH (ref 70–99)
Glucose-Capillary: 142 mg/dL — ABNORMAL HIGH (ref 70–99)
Glucose-Capillary: 248 mg/dL — ABNORMAL HIGH (ref 70–99)

## 2018-12-25 LAB — PROCALCITONIN: Procalcitonin: <0.1 ng/mL

## 2018-12-25 MED ORDER — FUROSEMIDE 80 MG PO TABS: 80.0000 mg | ORAL_TABLET | Freq: Every day | ORAL | 0 refills | Status: DC

## 2018-12-25 MED ORDER — LOSARTAN POTASSIUM 25 MG PO TABS: 25.0000 mg | ORAL_TABLET | Freq: Every day | ORAL | 0 refills | Status: DC

## 2018-12-25 MED ORDER — POTASSIUM CHLORIDE CRYS ER 20 MEQ PO TBCR: 40.0000 meq | EXTENDED_RELEASE_TABLET | Freq: Every day | ORAL | 0 refills | Status: AC

## 2018-12-25 NOTE — Progress Notes (Signed)
SATURATION QUALIFICATIONS: (This note is used to comply with regulatory documentation for home oxygen)  Patient Saturations on Room Air at Rest = 90%  Patient Saturations on Room Air while Ambulating = 85%  Patient Saturations on 2 Liters of oxygen while Ambulating = 92%  Please briefly explain why patient needs home oxygen: 

## 2018-12-25 NOTE — TOC Initial Note (Signed)
Transition of Care (TOC) - Initial/Assessment Note    Patient Details  Name: Justin Bradford. MRN: [id number] Date of Birth: February 22, 1941  Transition of [facility]) CM/SW Contact:    Zenon Mayo, RN Phone Number: 12/25/2018, 4:55 PM  Clinical Narrative:                 From home alone, for dc today with resumption of [facility] services, RN, PT, OT.  Soc will begin 24-48 hrs post dc.  Tiffany notified of patient being dc today. He states he has his medications delivered to him. He has transportation at discharged.    Expected Discharge Plan: [place] Barriers to Discharge: No Barriers Identified   Patient Goals and CMS Choice Patient states their goals for this hospitalization and ongoing recovery are:: to get better CMS Medicare.gov Compare Post Acute Care list provided to:: Patient Choice offered to / list presented to : Patient  Expected Discharge Plan and Services Expected Discharge Plan: [place] In-house Referral: NA Discharge Planning Services: CM Consult Post Acute Care Choice: Resumption of Svcs/PTA Provider, Home Health Living arrangements for the past 2 months: Single Family Home Expected Discharge Date: 12/25/18               DME Arranged: Oxygen DME Agency: [employer] Date DME Agency Contacted: 12/25/18 Time DME Agency Contacted: [phone] Representative spoke with at DME Agency: zack HH Arranged: RN, PT, OT [place] Agency: [facility] (formerly [employer]) Date [place]: 12/25/18 Time [place]: 1654 Representative spoke with at [place]: [place]  Prior Living Arrangements/Services Living arrangements for the past 2 months: [place] with:: Self Patient language and need for interpreter reviewed:: Yes Do you feel safe going back to the place where you live?: Yes      Need for Family Participation in Patient Care: No (Comment) Care giver support system in place?: No  (comment)   Criminal Activity/Legal Involvement Pertinent to Current Situation/Hospitalization: No - Comment as needed  Activities of Daily Living Home Assistive Devices/Equipment: Walker (specify type) ADL Screening (condition at time of admission) Patient's cognitive ability adequate to safely complete daily activities?: Yes Is the patient deaf or have difficulty hearing?: No Does the patient have difficulty seeing, even when wearing glasses/contacts?: No Does the patient have difficulty concentrating, remembering, or making decisions?: No Patient able to express need for assistance with ADLs?: Yes Does the patient have difficulty dressing or bathing?: No Independently performs ADLs?: Yes (appropriate for developmental age) Does the patient have difficulty walking or climbing stairs?: No Weakness of Legs: None Weakness of Arms/Hands: None  Permission Sought/Granted                  Emotional Assessment Appearance:: Appears stated age Attitude/Demeanor/Rapport: Gracious Affect (typically observed): Appropriate Orientation: : Oriented to Self, Oriented to Place, Oriented to  Time, Oriented to Situation Alcohol / Substance Use: Not Applicable Psych Involvement: No (comment)  Admission diagnosis:  Hypoxia [R09.02] COPD exacerbation ([place]) [J44.1] HCAP (healthcare-associated pneumonia) [J18.9] Acute respiratory failure with hypoxia ([place]) [J96.01] Patient Active Problem List   Diagnosis Date Noted  . Acute respiratory distress   . Acute respiratory failure with hypoxia ([place]) 12/17/2018  . CAP (community acquired pneumonia) 12/17/2018  . HCAP (healthcare-associated pneumonia)   . PAF (paroxysmal atrial fibrillation) ([place]) 11/19/2018  . On amiodarone therapy 11/19/2018  . CAD in native artery 11/17/2018  . Ischemic cardiomyopathy 11/17/2018  . Hyperlipidemia  11/17/2018  . AKI (acute kidney injury) (HCC)   . Chronic systolic heart failure (HCC)   . Hypotension due to drugs    . Debility 10/27/2018  . Acute hypoxemic respiratory failure (HCC)   . Myocardial infarction involving left anterior descending (LAD) coronary artery (HCC) 10/21/2018  . Supplemental oxygen dependent   . Hypertensive heart disease with heart failure (HCC)   . Steroid-induced hyperglycemia   . Diabetes mellitus type 2 in obese (HCC)   . Tobacco abuse   . Leukocytosis   . Hyponatremia   . Status post coronary artery stent placement   . Acute anterior myocardial infarction (HCC)   . Atypical pneumonia   . Elevated troponin   . Chest pain 10/15/2018  . COPD (chronic obstructive pulmonary disease) (HCC) 10/15/2018  . Left wrist injury 03/22/2016  . Bilateral primary osteoarthritis of knee 03/15/2015   PCP:  Forrest Moron, MD Pharmacy:   [facility] [address] [place] - [address] [place] [address] Phone: [phone] Fax: [phone]     Social Determinants of Health (SDOH) Interventions    Readmission Risk Interventions Readmission Risk Prevention Plan 12/25/2018  Transportation Screening Complete  Medication Review (RN Care Manager) Complete  PCP or Specialist appointment within 3-5 days of discharge Complete  HRI or Home Care Consult Complete  SW Recovery Care/Counseling Consult Complete  Palliative Care Screening Not Applicable  Skilled Nursing Facility Not Applicable  Some recent data might be hidden

## 2018-12-25 NOTE — Discharge Instructions (Signed)
Community-Acquired Pneumonia, Adult  Pneumonia is an infection of the lungs. It causes swelling in the airways of the lungs. Mucus and fluid may also build up inside the airways.  One type of pneumonia can happen while a person is in a hospital. A different type can happen when a person is not in a hospital (community-acquired pneumonia).   What are the causes?    This condition is caused by germs (viruses, bacteria, or fungi). Some types of germs can be passed from one person to another. This can happen when you breathe in droplets from the cough or sneeze of an infected person.  What increases the risk?  You are more likely to develop this condition if you:   Have a long-term (chronic) disease, such as:  ? Chronic obstructive pulmonary disease (COPD).  ? Asthma.  ? Cystic fibrosis.  ? Congestive heart failure.  ? Diabetes.  ? Kidney disease.   Have HIV.   Have sickle cell disease.   Have had your spleen removed.   Do not take good care of your teeth and mouth (poor dental hygiene).   Have a medical condition that increases the risk of breathing in droplets from your own mouth and nose.   Have a weakened body defense system (immune system).   Are a smoker.   Travel to areas where the germs that cause this illness are common.   Are around certain animals or the places they live.  What are the signs or symptoms?   A dry cough.   A wet (productive) cough.   Fever.   Sweating.   Chest pain. This often happens when breathing deeply or coughing.   Fast breathing or trouble breathing.   Shortness of breath.   Shaking chills.   Feeling tired (fatigue).   Muscle aches.  How is this treated?  Treatment for this condition depends on many things. Most adults can be treated at home. In some cases, treatment must happen in a hospital. Treatment may include:   Medicines given by mouth or through an IV tube.   Being given extra oxygen.   Respiratory therapy.  In rare cases, treatment for very bad pneumonia  may include:   Using a machine to help you breathe.   Having a procedure to remove fluid from around your lungs.  Follow these instructions at home:  Medicines   Take over-the-counter and prescription medicines only as told by your doctor.  ? Only take cough medicine if you are losing sleep.   If you were prescribed an antibiotic medicine, take it as told by your doctor. Do not stop taking the antibiotic even if you start to feel better.  General instructions     Sleep with your head and neck raised (elevated). You can do this by sleeping in a recliner or by putting a few pillows under your head.   Rest as needed. Get at least 8 hours of sleep each night.   Drink enough water to keep your pee (urine) pale yellow.   Eat a healthy diet that includes plenty of vegetables, fruits, whole grains, low-fat dairy products, and lean protein.   Do not use any products that contain nicotine or tobacco. These include cigarettes, e-cigarettes, and chewing tobacco. If you need help quitting, ask your doctor.   Keep all follow-up visits as told by your doctor. This is important.  How is this prevented?  A shot (vaccine) can help prevent pneumonia. Shots are often suggested for:   People   older than 77 years of age.   People older than 77 years of age who:  ? Are having cancer treatment.  ? Have long-term (chronic) lung disease.  ? Have problems with their body's defense system.  You may also prevent pneumonia if you take these actions:   Get the flu (influenza) shot every year.   Go to the dentist as often as told.   Wash your hands often. If you cannot use soap and water, use hand sanitizer.  Contact a doctor if:   You have a fever.   You lose sleep because your cough medicine does not help.  Get help right away if:   You are short of breath and it gets worse.   You have more chest pain.   Your sickness gets worse. This is very serious if:  ? You are an older adult.  ? Your body's defense system is weak.   You  cough up blood.  Summary   Pneumonia is an infection of the lungs.   Most adults can be treated at home. Some will need treatment in a hospital.   Drink enough water to keep your pee pale yellow.   Get at least 8 hours of sleep each night.  This information is not intended to replace advice given to you by your health care provider. Make sure you discuss any questions you have with your health care provider.  Document Released: 12/05/2007 Document Revised: 02/13/2018 Document Reviewed: 02/13/2018  Elsevier Interactive Patient Education  2019 [employer]

## 2018-12-25 NOTE — Care Management Important Message (Signed)
Important Message  Patient Details  Name: Justin Bradford. MRN: [id number] Date of Birth: 1940-09-09   Medicare Important Message Given:  Yes     Jordie Skalsky 12/25/2018, 1:34 PM

## 2018-12-25 NOTE — Discharge Summary (Signed)
Discharge Summary  Justin PoseyBruce D Bhatt Bradford. BJY:[id number] DOB: 02/14/1941  PCP: Forrest Moronuehle, Stephen, MD  Admit date: 12/16/2018 Discharge date: 12/25/2018  Time spent: 35 minutes  Recommendations for Outpatient Follow-up:  1. Follow with cardiology 2. Take medication as prescribed 3. Continue physical therapy 4. Fall precautions  Discharge Diagnoses:  Active Hospital Problems   Diagnosis Date Noted  . Acute respiratory failure with hypoxia (HCC) 12/17/2018  . Acute respiratory distress   . CAP (community acquired pneumonia) 12/17/2018  . PAF (paroxysmal atrial fibrillation) (HCC) 11/19/2018  . CAD in native artery 11/17/2018  . Chronic systolic heart failure (HCC)   . Diabetes mellitus type 2 in obese (HCC)   . COPD (chronic obstructive pulmonary disease) (HCC) 10/15/2018    Resolved Hospital Problems  No resolved problems to display.    Discharge Condition: Stable  Diet recommendation: Resume previous diet, heart healthy low-sodium diet.  Vitals:   12/24/18 1626 12/25/18 0816  BP: 106/64 104/64  Pulse: 73 70  Resp:    Temp: 98.2 F (36.8 C) 97.7 F (36.5 C)  SpO2: 96% 95%    History of present illness:  Brief Narrative:   Justin AnesBruce D Croson Bradfordis a 78 y.o.malewithhistory of recent non-ST elevation MI in April 2020 status post stent placement with systolic heart failure last EF measured last week was 35%, COPD, diabetes mellitus, hypertension presents to the ER because of worsening shortness of breath on exertion over the last 1 week, hypoxic, with elevated BNP, and elevated troponins. CT angio of the chest suspicous for patchy bilateral infiltrates concerning for infection vs edema.   His COVID inpatient test was negative.  He was started on Broad spectrum IV antibiotics, and IV lasix for diuresis and IV steroids.  Repeat NOVEL Coronavirus test negative.  His breathing has improved, transitioned to oral lasix, discontinue antibiotics on 6/23 and plan for discharge in  24 to 48 hours.   12/25/18: Patient seen and examined at his bedside.  He feels well.  Tolerating his medications well.  He denies chest pain, dyspnea, or dizziness.  He has no new concerns.  He wants to go home.  On the day of discharge, the patient was hemodynamically stable.  He will need to follow-up with his cardiologist and PCP post hospitalization.  Take his medications as prescribed.  Continue physical therapy.  Fall precautions.  Hospital Course:  Principal Problem:   Acute respiratory failure with hypoxia (HCC) Active Problems:   COPD (chronic obstructive pulmonary disease) (HCC)   Diabetes mellitus type 2 in obese (HCC)   Chronic systolic heart failure (HCC)   CAD in native artery   PAF (paroxysmal atrial fibrillation) (HCC)   CAP (community acquired pneumonia)   Acute respiratory distress  Acute hypoxic respiratory failure likely secondary to acute on chronic systolic CHF versus CAP.      - covid 19 test is negative     - started the patient on IV rocephin and zithromax, IV steroids and bronchodilators.      - completed 7 day rocephin course     -Continue p.o. Lasix 80 mg daily as recommended by cardiology     -Net I&O -12.11 L since admission.     -Maintain O2 saturation greater than 92%     -Home O2 evaluation prior to discharge  Acute on chronic systolic CHF Continue diuresing Low-salt diet Continue cardiac medications Follow-up with cardiology outpatient  Resolving community-acquired pneumonia Completed course of antibiotics as stated above Procalcitonin less than 0.10 on 12/25/2018 Lactic acid  0.9 on 12/25/2018  CAD     - Pt on aspirin , statin, coreg, and losartan.      - Cardiology restarted the Brillinta.      - He denies any chest pain at this time.   Type 2 DM     -Hemoglobin A1c 6.3 on 12/22/2018      -GFR greater than 60, resume metformin     -Follow-up with your PCP  Anxiety and depression:     -Stable.  Continue Prozac, klonopin   Paroxysmal  atrial fibrillation      -Rate controlled.  Continue Coreg, ASA    Code Status: FULL      Consultants:   Cardiology   Discharge Exam: BP 104/64 (BP Location: Right Arm)   Pulse 70   Temp 97.7 F (36.5 C) (Oral)   Resp 19   Ht 6' (1.829 m)   Wt 117.1 kg   SpO2 95%   BMI 35.01 kg/m  . General: 78 y.o. year-old male well developed well nourished in no acute distress.  Alert and oriented x3. . Cardiovascular: Regular rate and rhythm with no rubs or gallops.  No thyromegaly or JVD noted.   Marland Kitchen. Respiratory: Clear to auscultation with no wheezes or rales. Good inspiratory effort. . Abdomen: Soft nontender nondistended with normal bowel sounds x4 quadrants. . Musculoskeletal: No lower extremity edema. 2/4 pulses in all 4 extremities. . Skin: No ulcerative lesions noted or rashes, . Psychiatry: Mood is appropriate for condition and setting  Discharge Instructions You were cared for by a hospitalist during your hospital stay. If you have any questions about your discharge medications or the care you received while you were in the hospital after you are discharged, you can call the unit and asked to speak with the hospitalist on call if the hospitalist that took care of you is not available. Once you are discharged, your primary care physician will handle any further medical issues. Please note that NO REFILLS for any discharge medications will be authorized once you are discharged, as it is imperative that you return to your primary care physician (or establish a relationship with a primary care physician if you do not have one) for your aftercare needs so that they can reassess your need for medications and monitor your lab values.   Allergies as of 12/25/2018   No Known Allergies     Medication List    STOP taking these medications   amiodarone 200 MG tablet Commonly known as: PACERONE     TAKE these medications   acetaminophen 325 MG tablet Commonly known as: TYLENOL  Take 2 tablets (650 mg total) by mouth every 4 (four) hours as needed for headache or mild pain.   aspirin EC 81 MG tablet Take 81 mg by mouth daily.   atorvastatin 80 MG tablet Commonly known as: LIPITOR Take 1 tablet (80 mg total) by mouth daily at 6 PM.   carvedilol 3.125 MG tablet Commonly known as: COREG Take 1 tablet (3.125 mg total) by mouth 2 (two) times daily with a meal for 30 days.   clonazePAM 1 MG tablet Commonly known as: KLONOPIN Take 1 tablet (1 mg total) by mouth at bedtime.   FLUoxetine 20 MG capsule Commonly known as: PROZAC Take 3 capsules (60 mg total) by mouth daily for 30 days.   furosemide 80 MG tablet Commonly known as: LASIX Take 1 tablet (80 mg total) by mouth daily for 30 days.   lamoTRIgine 25 MG tablet  Commonly known as: LAMICTAL Take 3 tablets (75 mg total) by mouth at bedtime.   losartan 25 MG tablet Commonly known as: COZAAR Take 1 tablet (25 mg total) by mouth daily. Start taking on: December 26, 2018 What changed: how much to take   metFORMIN 500 MG tablet Commonly known as: GLUCOPHAGE Take 0.5 tablets (250 mg total) by mouth every morning. What changed: when to take this   pantoprazole 40 MG tablet Commonly known as: PROTONIX Take 1 tablet (40 mg total) by mouth daily.   potassium chloride SA 20 MEQ tablet Commonly known as: K-DUR Take 2 tablets (40 mEq total) by mouth daily for 30 days.   spironolactone 25 MG tablet Commonly known as: ALDACTONE Take 0.5 tablets (12.5 mg total) by mouth daily for 30 days.   tamsulosin 0.4 MG Caps capsule Commonly known as: FLOMAX Take 1 capsule (0.4 mg total) by mouth at bedtime.   ticagrelor 90 MG Tabs tablet Commonly known as: BRILINTA Take 1 tablet (90 mg total) by mouth 2 (two) times daily.            Durable Medical Equipment  (From admission, onward)         Start     Ordered   12/25/18 0602  For home use only DME 3 n 1  Once     12/25/18 0601   12/25/18 0601  For home use  only DME Walker rolling  Once    Comments: 5" wheels  Question:  Patient needs a walker to treat with the following condition  Answer:  Ambulatory dysfunction   12/25/18 0601   12/24/18 1523  For home use only DME oxygen  Once    Comments: Patient progressing with therapy today, increasing independence with OOB mobility. Patient hovers around SpO2 90% on RA at rest, desats to 85% with DOE 2/4 with 50' of gait. Related to acute on chronic systolic HF.  Question Answer Comment  Length of Need 6 Months   Liters per Minute 2   Frequency Continuous (stationary and portable oxygen unit needed)   Oxygen conserving device Yes   Oxygen delivery system Gas      12/24/18 1522         No Known Allergies Follow-up Information    Forrest Moronuehle, Stephen, MD. Call in 1 day(s).   Specialty: Internal Medicine Why: Please call for a post hospital follow-up appointment. Contact information: [address] [place] [address] [phone]        Rollene RotundaHochrein, James, MD. Call in 1 day(s).   Specialty: Cardiology Why: Please call for a post hospital follow-up appointment. Contact information: [address] [place] [address] [phone]            The results of significant diagnostics from this hospitalization (including imaging, microbiology, ancillary and laboratory) are listed below for reference.    Significant Diagnostic Studies: Ct Angio Chest Pe W Or Wo Contrast  Result Date: 12/17/2018 CLINICAL DATA:  Shortness of breath for 1 week. Symptoms became worse yesterday. EXAM: CT ANGIOGRAPHY CHEST WITH CONTRAST TECHNIQUE: Multidetector CT imaging of the chest was performed using the standard protocol during bolus administration of intravenous contrast. Multiplanar CT image reconstructions and MIPs were obtained to evaluate the vascular anatomy. CONTRAST:  75mL OMNIPAQUE IOHEXOL 350 MG/ML SOLN COMPARISON:  One-view chest x-ray 12/18/2018. CT of the chest 10/23/2018  FINDINGS: Cardiovascular: The heart size is normal. Coronary artery calcifications are present in the LAD and right circumflex artery. No  significant pericardial effusion is present. Atherosclerotic changes are noted in the aorta and great vessel origins. There is no aortic aneurysm. No significant stenosis is evident at the origins of the great vessels. Pulmonary artery opacification is excellent. No focal filling defects are present to suggest pulmonary embolus. Mediastinum/Nodes: A right paratracheal node near the carina measures 12 mm in short axis. Small prevascular nodes are present. Other subcentimeter paratracheal nodes are present. Asymmetric right hilar adenopathy is noted. Lungs/Pleura: Diffuse patchy bilateral airspace opacities are present. Moderate bilateral pleural effusions are noted. Upper Abdomen: Visualized upper abdomen is unremarkable. Musculoskeletal: Vertebral body heights and alignment are maintained. No focal lytic or blastic lesions are present. Sternum is unremarkable. Ribs are within normal limits bilaterally. Review of the MIP images confirms the above findings. IMPRESSION: 1. No pulmonary embolus. 2. Diffuse patchy airspace disease bilaterally. Findings are most concerning for infection. 3. Bilateral pleural effusions. Airspace disease could represent edema and heart failure. 4.  Aortic Atherosclerosis (ICD10-I70.0). 5. Coronary artery disease. Electronically Signed   By: San Morelle M.D.   On: 12/17/2018 07:06   Dg Chest Port 1 View  Result Date: 12/21/2018 CLINICAL DATA:  Pulmonary opacities EXAM: PORTABLE CHEST 1 VIEW COMPARISON:  12/19/2018 chest radiograph. FINDINGS: Stable cardiomediastinal silhouette with mild cardiomegaly. No pneumothorax. No pleural effusion. Patchy opacities throughout both lungs have mildly improved. IMPRESSION: Cardiomegaly with mildly improved patchy opacities throughout both lungs, which could represent pulmonary edema or infection.  Electronically Signed   By: Ilona Sorrel M.D.   On: 12/21/2018 10:03   Dg Chest Port 1 View  Result Date: 12/19/2018 CLINICAL DATA:  History of heart failure. EXAM: PORTABLE CHEST 1 VIEW COMPARISON:  12/18/2018.  12/16/2018. FINDINGS: Cardiomegaly. Diffuse bilateral pulmonary infiltrates/edema again noted. No interim change. No pleural effusion or pneumothorax. Degenerative changes scoliosis thoracic spine. IMPRESSION: 1.  Cardiomegaly. 2. Diffuse bilateral pulmonary infiltrates/edema again noted. No interim change. Electronically Signed   By: Marcello Moores  Register   On: 12/19/2018 13:11   Dg Chest Port 1 View  Result Date: 12/18/2018 CLINICAL DATA:  Acute respiratory distress EXAM: PORTABLE CHEST 1 VIEW COMPARISON:  December 18, 2018 FINDINGS: Multifocal airspace opacities are again noted. The heart size is stable. There is no pneumothorax. No large pleural effusion. There is no acute osseous abnormality. Bilateral pleural effusions are again noted. IMPRESSION: Persistent unchanged multifocal airspace opacities which may represent pulmonary edema or an atypical infectious process. Electronically Signed   By: Constance Holster M.D.   On: 12/18/2018 20:15   Dg Chest Port 1 View  Result Date: 12/18/2018 CLINICAL DATA:  Shortness of breath for 1 week. EXAM: PORTABLE CHEST 1 VIEW COMPARISON:  12/16/2018 and prior radiographs FINDINGS: Increasing bilateral airspace opacities noted since 12/16/2018. The cardiomediastinal silhouette is unchanged. Equivocal small RIGHT pleural effusion noted. No pneumothorax or acute bony abnormality. IMPRESSION: Increasing bilateral airspace opacities which may represent edema and/or infection. Electronically Signed   By: Margarette Canada M.D.   On: 12/18/2018 13:09   Dg Chest Portable 1 View  Result Date: 12/16/2018 CLINICAL DATA:  Shortness of breath EXAM: PORTABLE CHEST 1 VIEW COMPARISON:  10/23/2018 FINDINGS: Heart is borderline in size. Worsening airspace disease in the lower  lobes, right greater than left concerning for pneumonia. Improved aeration in the right upper lobe since prior study. Possible small right effusion. No acute bony abnormality. IMPRESSION: Bilateral lower lobe airspace opacities, most confluent in the right lower lobe concerning for pneumonia. Electronically Signed   By: Rolm Baptise M.D.  On: 12/16/2018 23:33    Microbiology: Recent Results (from the past 240 hour(s))  SARS Coronavirus 2     Status: None   Collection Time: 12/16/18 11:00 PM  Result Value Ref Range Status   SARS Coronavirus 2 NOT DETECTED NOT DETECTED Final    Comment: (NOTE) SARS-CoV-2 target nucleic acids are NOT DETECTED. The SARS-CoV-2 RNA is generally detectable in upper and lower respiratory specimens during the acute phase of infection.  Negative  results do not preclude SARS-CoV-2 infection, do not rule out co-infections with other pathogens, and should not be used as the sole basis for treatment or other patient management decisions.  Negative results must be combined with clinical observations, patient history, and epidemiological information. The expected result is Not Detected. Fact Sheet for Patients: http://www.biofiredefense.com/wp-content/uploads/2020/03/BIOFIRE-COVID -19-patients.pdf Fact Sheet for Healthcare Providers: http://www.biofiredefense.com/wp-content/uploads/2020/03/BIOFIRE-COVID -19-hcp.pdf This test is not yet approved or cleared by the [place] and  has been authorized for detection and/or diagnosis of SARS-CoV-2 by FDA under an Emergency Use Authorization (EUA).  This EUA will remain in effec t (meaning this test can be used) for the duration of  the COVID-19 declaration under Section 564(b)(1) of the Act, 21 U.S.C. section 360bbb-3(b)(1), unless the authorization is terminated or revoked sooner. Performed at [facility], [address], [place], [place] [address]   Novel Coronavirus,NAA,(SEND-OUT TO REF LAB - TAT  24-48 hrs); Hosp Order     Status: None   Collection Time: 12/16/18 11:00 PM   Specimen: Nasopharyngeal Swab; Respiratory  Result Value Ref Range Status   SARS-CoV-2, NAA NOT DETECTED NOT DETECTED Final    Comment: (NOTE) This test was developed and its performance characteristics determined by [employer]. This test has not been FDA cleared or approved. This test has been authorized by FDA under an Emergency Use Authorization (EUA). This test is only authorized for the duration of time the declaration that circumstances exist justifying the authorization of the emergency use of in vitro diagnostic tests for detection of SARS-CoV-2 virus and/or diagnosis of COVID-19 infection under section 564(b)(1) of the Act, 21 U.S.C. 932TFT-7(D)(2), unless the authorization is terminated or revoked sooner. When diagnostic testing is negative, the possibility of a false negative result should be considered in the context of a patient's recent exposures and the presence of clinical signs and symptoms consistent with COVID-19. An individual without symptoms of COVID-19 and who is not shedding SARS-CoV-2 virus would expect to have a negative (not detected) result in this assay. Performed  At: [facility] [address] [place], [place] [id number] Jolene Schimke MD CB:[id number]    Coronavirus Source NASOPHARYNGEAL  Final    Comment: Performed at [facility], [address] [place], [place] [address]  Blood culture (routine x 2)     Status: None   Collection Time: 12/17/18 12:25 AM   Specimen: BLOOD LEFT ARM  Result Value Ref Range Status   Specimen Description BLOOD LEFT ARM  Final   Special Requests   Final    BOTTLES DRAWN AEROBIC AND ANAEROBIC Blood Culture adequate volume   Culture   Final    NO GROWTH 5 DAYS Performed at [facility], [address] [place], [place] [address]    Report Status 12/22/2018 FINAL  Final  Blood culture (routine x 2)     Status:  None   Collection Time: 12/17/18  1:40 AM   Specimen: BLOOD LEFT ARM  Result Value Ref Range Status   Specimen Description  BLOOD LEFT ARM  Final   Special Requests   Final    BOTTLES DRAWN AEROBIC AND ANAEROBIC Blood Culture adequate volume   Culture   Final    NO GROWTH 5 DAYS Performed at [facility], [address], [place], [place] [address]    Report Status 12/22/2018 FINAL  Final  Respiratory Panel by PCR     Status: None   Collection Time: 12/19/18  7:46 PM   Specimen: Nasopharyngeal Swab; Respiratory  Result Value Ref Range Status   Adenovirus NOT DETECTED NOT DETECTED Final   Coronavirus 229E NOT DETECTED NOT DETECTED Final    Comment: (NOTE) The Coronavirus on the Respiratory Panel, DOES NOT test for the novel  Coronavirus (2019 nCoV)    Coronavirus HKU1 NOT DETECTED NOT DETECTED Final   Coronavirus NL63 NOT DETECTED NOT DETECTED Final   Coronavirus OC43 NOT DETECTED NOT DETECTED Final   Metapneumovirus NOT DETECTED NOT DETECTED Final   Rhinovirus / Enterovirus NOT DETECTED NOT DETECTED Final   Influenza A NOT DETECTED NOT DETECTED Final   Influenza B NOT DETECTED NOT DETECTED Final   Parainfluenza Virus 1 NOT DETECTED NOT DETECTED Final   Parainfluenza Virus 2 NOT DETECTED NOT DETECTED Final   Parainfluenza Virus 3 NOT DETECTED NOT DETECTED Final   Parainfluenza Virus 4 NOT DETECTED NOT DETECTED Final   Respiratory Syncytial Virus NOT DETECTED NOT DETECTED Final   Bordetella pertussis NOT DETECTED NOT DETECTED Final   Chlamydophila pneumoniae NOT DETECTED NOT DETECTED Final   Mycoplasma pneumoniae NOT DETECTED NOT DETECTED Final    Comment: Performed at [facility], [address] [place], [place] [address]     Labs: Basic Metabolic Panel: Recent Labs  Lab 12/21/18 0804 12/22/18 9030 12/23/18 0750 12/24/18 0526 12/25/18 0508  NA 140 140 140 141 139  K 3.2* 3.5 3.7 3.8 3.8  CL 100 97* 100 100 100  CO2 28 30 30 31 31   GLUCOSE 221* 201* 220*  146* 127*  BUN 35* 34* 35* 32* 30*  CREATININE 1.09 1.10 1.14 0.93 1.05  CALCIUM 8.8* 8.9 8.5* 8.6* 8.5*  MG 2.2  --   --   --  2.2  PHOS  --   --   --   --  3.2   Liver Function Tests: Recent Labs  Lab 12/25/18 0508  ALBUMIN 2.6*   No results for input(s): LIPASE, AMYLASE in the last 168 hours. No results for input(s): AMMONIA in the last 168 hours. CBC: Recent Labs  Lab 12/19/18 0732 12/21/18 0804 12/24/18 0526 12/25/18 0508  WBC 25.2* 17.2* 19.6* 16.5*  NEUTROABS  --   --   --  11.9*  HGB 12.2* 13.0 13.0 13.2  HCT 37.6* 40.9 41.1 41.3  MCV 91.0 91.7 92.8 92.4  PLT 441* 461* 423* 410*   Cardiac Enzymes: No results for input(s): CKTOTAL, CKMB, CKMBINDEX, TROPONINI in the last 168 hours. BNP: BNP (last 3 results) Recent Labs    12/16/18 2321  BNP 916.8*    ProBNP (last 3 results) No results for input(s): PROBNP in the last 8760 hours.  CBG: Recent Labs  Lab 12/24/18 0800 12/24/18 1147 12/24/18 1631 12/24/18 2119 12/25/18 0813  GLUCAP 136* 220* 147* 148* 111*       Signed:  Darlin Drop, MD [facility] 12/25/2018, 2:41 PM

## 2018-12-25 NOTE — TOC Transition Note (Signed)
Transition of Care [facility]) - CM/SW Discharge Note   Patient Details  Name: Justin Bradford. MRN: [id number] Date of Birth: 1940-07-13  Transition of Care [facility] CM/SW Contact:  Zenon Mayo, RN Phone Number: 12/25/2018, 5:00 PM   Clinical Narrative:    From home alone, for dc today with resumption of [facility] services, RN, PT, OT.  Soc will begin 24-48 hrs post dc.  Tiffany notified of patient being dc today. He states he has his medications delivered to him. He has transportation at discharged.     Final next level of care: [place] Barriers to Discharge: No Barriers Identified   Patient Goals and CMS Choice Patient states their goals for this hospitalization and ongoing recovery are:: to get better CMS Medicare.gov Compare Post Acute Care list provided to:: Patient Choice offered to / list presented to : Patient  Discharge Placement                       Discharge Plan and Services In-house Referral: NA Discharge Planning Services: CM Consult Post Acute Care Choice: Resumption of Svcs/PTA Provider, Home Health          DME Arranged: Oxygen DME Agency: [employer] Date DME Agency Contacted: 12/25/18 Time DME Agency Contacted: [phone] Representative spoke with at DME Agency: zack HH Arranged: RN, PT, OT [place] Agency: [facility] (formerly [employer]) Date [place]: 12/25/18 Time [place]: 1659 Representative spoke with at [place]: [place] ([place]) Interventions     Readmission Risk Interventions Readmission Risk Prevention Plan 12/25/2018  Transportation Screening Complete  Medication Review Press photographer) Complete  PCP or Specialist appointment within 3-5 days of discharge Complete  HRI or [place] Complete  SW Recovery Care/Counseling Consult Complete  [place] Not Applicable  Some recent data might be  hidden

## 2018-12-31 ENCOUNTER — Other Ambulatory Visit: Payer: Self-pay

## 2018-12-31 ENCOUNTER — Encounter: Payer: Self-pay | Admitting: Cardiology

## 2018-12-31 ENCOUNTER — Telehealth: Payer: Self-pay | Admitting: Cardiology

## 2018-12-31 ENCOUNTER — Ambulatory Visit (INDEPENDENT_AMBULATORY_CARE_PROVIDER_SITE_OTHER): Payer: Medicare Other | Admitting: Cardiology

## 2018-12-31 VITALS — BP 100/60 | HR 78 | Temp 97.1°F | Ht 72.0 in | Wt 264.0 lb

## 2018-12-31 DIAGNOSIS — I2510 Atherosclerotic heart disease of native coronary artery without angina pectoris: Secondary | ICD-10-CM

## 2018-12-31 DIAGNOSIS — I5022 Chronic systolic (congestive) heart failure: Secondary | ICD-10-CM

## 2018-12-31 DIAGNOSIS — I255 Ischemic cardiomyopathy: Secondary | ICD-10-CM | POA: Diagnosis not present

## 2018-12-31 DIAGNOSIS — E782 Mixed hyperlipidemia: Secondary | ICD-10-CM

## 2018-12-31 DIAGNOSIS — I110 Hypertensive heart disease with heart failure: Secondary | ICD-10-CM

## 2018-12-31 DIAGNOSIS — J449 Chronic obstructive pulmonary disease, unspecified: Secondary | ICD-10-CM

## 2018-12-31 DIAGNOSIS — I480 Paroxysmal atrial fibrillation: Secondary | ICD-10-CM

## 2018-12-31 MED ORDER — SPIRONOLACTONE 25 MG PO TABS: 12.5000 mg | ORAL_TABLET | Freq: Every day | ORAL | 3 refills | Status: DC

## 2018-12-31 MED ORDER — SACUBITRIL-VALSARTAN 24-26 MG PO TABS: 1.0000 | ORAL_TABLET | Freq: Two times a day (BID) | ORAL | 3 refills | Status: DC

## 2018-12-31 NOTE — Patient Instructions (Addendum)
Medication Instructions:  Your physician has recommended you make the following change in your medication:  STOP losartan   START sacubitril-valsartan (entresto) 24-26 mg: Take 1 tablet daily in the middle of the day  RESTART spironolactone (aldactone) 25 mg: Take 0.5 tablet (12.5 mg) daily  If you need a refill on your cardiac medications before your next appointment, please call your pharmacy.   Lab work: Your physician recommends that you return for lab work today: BMP, ProBNP.   If you have labs (blood work) drawn today and your tests are completely normal, you will receive your results only by: Marland Kitchen MyChart Message (if you have MyChart) OR . A paper copy in the mail If you have any lab test that is abnormal or we need to change your treatment, we will call you to review the results.  Testing/Procedures: You have been referred to [facility] for heart failure management. You will be contacted to set this up.   Follow-Up: At [facility], you and your health needs are our priority.  As part of our continuing mission to provide you with exceptional heart care, we have created designated Provider Care Teams.  These Care Teams include your primary Cardiologist (physician) and Advanced Practice Providers (APPs -  Physician Assistants and Nurse Practitioners) who all work together to provide you with the care you need, when you need it. You will need a follow up appointment in 2 weeks.         Heart Failure  Weigh yourself every morning when you first wake up and record on a calender or note pad, bring this to your office visits. Using a pill tender can help with taking your medications consistently.  Limit your fluid intake to 2 liters daily  Limit your sodium intake to less than 2-3 grams daily. Ask if you need dietary teaching.  If you gain more than 3 pounds (from your dry weight ), double your dose of diuretic for the day.  If you gain more than 5 pounds (from your dry  weight), double your dose of lasix and call your heart failure doctor.  Please do not smoke tobacco since it is very bad for your heart.  Please do not drink alcohol since it can worsen your heart failure.Also avoid OTC nonsteroidal drugs, such as advil, aleve and motrin.  Try to exercise for at least 30 minutes every day because this will help your heart be more efficient. You may be eligible for supervised cardiac rehab, ask your physician.    Sacubitril; Valsartan oral tablet What is this medicine? SACUBITRIL; VALSARTAN (sak UE bi tril; val SAR tan) is a combination of 2 drugs used to reduce the risk of death and hospitalizations in people with long-lasting heart failure. It is usually used with other medicines to treat heart failure. This medicine may be used for other purposes; ask your health care provider or pharmacist if you have questions. COMMON BRAND NAME(S): Entresto What should I tell my health care provider before I take this medicine? They need to know if you have any of these conditions:  diabetes and take a medicine that contains aliskiren  kidney disease  liver disease  an unusual or allergic reaction to sacubitril; valsartan, drugs called angiotensin converting enzyme (ACE) inhibitors, angiotensin II receptor blockers (ARBs), other medicines, foods, dyes, or preservatives  pregnant or trying to get pregnant  breast-feeding How should I use this medicine? Take this medicine by mouth with a glass of water. Follow the directions  on the prescription label. You can take it with or without food. If it upsets your stomach, take it with food. Take your medicine at regular intervals. Do not take it more often than directed. Do not stop taking except on your doctor's advice. Do not take this medicine for at least 36 hours before or after you take an ACE inhibitor medicine. Talk to your health care provider if you are not sure if you take an ACE inhibitor. Talk to your  pediatrician regarding the use of this medicine in children. Special care may be needed. Overdosage: If you think you have taken too much of this medicine contact a poison control center or emergency room at once. NOTE: This medicine is only for you. Do not share this medicine with others. What if I miss a dose? If you miss a dose, take it as soon as you can. If it is almost time for next dose, take only that dose. Do not take double or extra doses. What may interact with this medicine? Do not take this medicine with any of the following medicines:  aliskiren if you have diabetes  angiotensin-converting enzyme (ACE) inhibitors, like benazepril, captopril, enalapril, fosinopril, lisinopril, or ramipril This medicine may also interact with the following medicines:  angiotensin II receptor blockers (ARBs) like azilsartan, candesartan, eprosartan, irbesartan, losartan, olmesartan, telmisartan, or valsartan  lithium  NSAIDS, medicines for pain and inflammation, like ibuprofen or naproxen  potassium-sparing diuretics like amiloride, spironolactone, and triamterene  potassium supplements This list may not describe all possible interactions. Give your health care provider a list of all the medicines, herbs, non-prescription drugs, or dietary supplements you use. Also tell them if you smoke, drink alcohol, or use illegal drugs. Some items may interact with your medicine. What should I watch for while using this medicine? Tell your doctor or healthcare professional if your symptoms do not start to get better or if they get worse. Do not become pregnant while taking this medicine. Women should inform their doctor if they wish to become pregnant or think they might be pregnant. There is a potential for serious side effects to an unborn child. Talk to your health care professional or pharmacist for more information. You may get dizzy. Do not drive, use machinery, or do anything that needs mental  alertness until you know how this medicine affects you. Do not stand or sit up quickly, especially if you are an older patient. This reduces the risk of dizzy or fainting spells. Avoid alcoholic drinks; they can make you more dizzy. What side effects may I notice from receiving this medicine? Side effects that you should report to your doctor or health care professional as soon as possible:  allergic reactions like skin rash, itching or hives, swelling of the face, lips, or tongue  signs and symptoms of increased potassium like muscle weakness; chest pain; or fast, irregular heartbeat  signs and symptoms of kidney injury like trouble passing urine or change in the amount of urine  signs and symptoms of low blood pressure like feeling dizzy or lightheaded, or if you develop extreme fatigue Side effects that usually do not require medical attention (report to your doctor or health care professional if they continue or are bothersome):  cough This list may not describe all possible side effects. Call your doctor for medical advice about side effects. You may report side effects to FDA at 1-800-FDA-1088. Where should I keep my medicine? Keep out of the reach of children. Store at room  temperature between 15 and 30 degrees C (59 and 86 degrees F). Throw away any unused medicine after the expiration date. NOTE: This sheet is a summary. It may not cover all possible information. If you have questions about this medicine, talk to your doctor, pharmacist, or health care provider.  2020 Elsevier/Gold Standard (2015-08-03 13:54:19)   Spironolactone tablets What is this medicine? SPIRONOLACTONE (speer on oh LAK tone) is a diuretic. It helps you make more urine and to lose excess water from your body. This medicine is used to treat high blood pressure, and edema or swelling from heart, kidney, or liver disease. It is also used to treat patients who make too much aldosterone or have low potassium. This  medicine may be used for other purposes; ask your health care provider or pharmacist if you have questions. COMMON BRAND NAME(S): Aldactone What should I tell my health care provider before I take this medicine? They need to know if you have any of these conditions:  high blood level of potassium  kidney disease or trouble making urine  liver disease  an unusual or allergic reaction to spironolactone, other medicines, foods, dyes, or preservatives  pregnant or trying to get pregnant  breast-feeding How should I use this medicine? Take this medicine by mouth with a drink of water. Follow the directions on your prescription label. You can take it with or without food. If it upsets your stomach, take it with food. Do not take your medicine more often than directed. Remember that you will need to pass more urine after taking this medicine. Do not take your doses at a time of day that will cause you problems. Do not take at bedtime. Talk to your pediatrician regarding the use of this medicine in children. While this drug may be prescribed for selected conditions, precautions do apply. Overdosage: If you think you have taken too much of this medicine contact a poison control center or emergency room at once. NOTE: This medicine is only for you. Do not share this medicine with others. What if I miss a dose? If you miss a dose, take it as soon as you can. If it is almost time for your next dose, take only that dose. Do not take double or extra doses. What may interact with this medicine? Do not take this medicine with any of the following medications:  cidofovir  eplerenone  tranylcypromine This medicine may also interact with the following medications:  aspirin  certain medicines for blood pressure or heart disease like benazepril, lisinopril, losartan, valsartan  certain medicines that treat or prevent blood clots like heparin and  enoxaparin  cholestyramine  cyclosporine  digoxin  lithium  medicines that relax muscles for surgery  NSAIDs, medicines for pain and inflammation, like ibuprofen or naproxen  other diuretics  potassium supplements  steroid medicines like prednisone or cortisone  trimethoprim This list may not describe all possible interactions. Give your health care provider a list of all the medicines, herbs, non-prescription drugs, or dietary supplements you use. Also tell them if you smoke, drink alcohol, or use illegal drugs. Some items may interact with your medicine. What should I watch for while using this medicine? Visit your doctor or health care professional for regular checks on your progress. Check your blood pressure as directed. Ask your doctor what your blood pressure should be, and when you should contact them. You may need to be on a special diet while taking this medicine. Ask your doctor. Also, ask how  many glasses of fluid you need to drink a day. You must not get dehydrated. This medicine may make you feel confused, dizzy or lightheaded. Drinking alcohol and taking some medicines can make this worse. Do not drive, use machinery, or do anything that needs mental alertness until you know how this medicine affects you. Do not sit or stand up quickly. What side effects may I notice from receiving this medicine? Side effects that you should report to your doctor or health care professional as soon as possible:  allergic reactions such as skin rash or itching, hives, swelling of the lips, mouth, tongue, or throat  black or tarry stools  fast, irregular heartbeat  fever  muscle pain, cramps  numbness, tingling in hands or feet  trouble breathing  trouble passing urine  unusual bleeding  unusually weak or tired Side effects that usually do not require medical attention (report to your doctor or health care professional if they continue or are bothersome):  change in  voice or hair growth  confusion  dizzy, drowsy  dry mouth, increased thirst  enlarged or tender breasts  headache  irregular menstrual periods  sexual difficulty, unable to have an erection  stomach upset This list may not describe all possible side effects. Call your doctor for medical advice about side effects. You may report side effects to FDA at 1-800-FDA-1088. Where should I keep my medicine? Keep out of the reach of children. Store below 25 degrees C (77 degrees F). Throw away any unused medicine after the expiration date. NOTE: This sheet is a summary. It may not cover all possible information. If you have questions about this medicine, talk to your doctor, pharmacist, or health care provider.  2020 Elsevier/Gold Standard (2016-11-02 09:42:28)

## 2018-12-31 NOTE — Progress Notes (Signed)
Cardiology Office Note:    Date:  12/31/2018   ID:  Justin PoseyBruce D Rainford Jr., DOB 02/02/1941, MRN [id number]  PCP:  Forrest Moronuehle, Stephen, MD  Cardiologist:  Norman HerrlichBrian Munley, MD    Referring MD: Forrest Moronuehle, Stephen, MD    ASSESSMENT:    1. Chronic systolic heart failure (HCC)   2. CAD in native artery   3. Ischemic cardiomyopathy   4. Chronic obstructive pulmonary disease, unspecified COPD type (HCC)   5. Mixed hyperlipidemia    PLAN:    In order of problems listed above:  1. Heart failure chronic systolic severe LV dysfunction as a consequence of MI he is fragile in terms of blood pressure continue minimum dose of beta-blocker high-dose loop diuretic transition losartan to Entresto and start MRA.  Will utilize [facility] to measure lung water will do weekly visits x4 months see back in the office in 2 weeks try to uptitrate his Entresto.  He will ultimately need an ICD unless he continues to deteriorate and then palliative care would be appropriate. 2. CAD stable no evidence of stent thrombosis continue his dual antiplatelet therapy 3. Ischemic cardiomyopathy severe advanced guideline directed treatment 4. Hyperlipidemia stable continue statin 5. COPD stable continue ambulatory oxygen   Next appointment: 2 weeks   Medication Adjustments/Labs and Tests Ordered: Current medicines are reviewed at length with the patient today.  Concerns regarding medicines are outlined above.  No orders of the defined types were placed in this encounter.  No orders of the defined types were placed in this encounter.   Chief Complaint  Patient presents with  . Follow-up    for   . Congestive Heart Failure    History of Present Illness:    Justin PoseyBruce D Ore Jr. is a 78 y.o. male with a hx of of CAD s/p NSTEMI 10/16/2018 with PCI/DES to LAD, chronic combined CHF (EF 35% 10/2018), HTN, HLD, DM type 2, COPD,  last seen 12/24/18 by dr Antoine PocheHochrein with pneumonia and decompensated heart failure. . Compliance with  diet, lifestyle and medications: Yes  In retrospect he had about 3-day period of increasing exertional shortness of breath before he presented with acute decompensated heart failure.  Since he is returned home his weight continues to fall a few pounds no edema chronic exertional shortness of breath on ambulatory oxygen no orthopnea chest pain palpitation or syncope.  Although his blood pressure is borderline we will [facility] and with normal potassium renal function start MRA recheck labs today BMP proBNP level and referred him to [facility] for the [facility] vest to measure lung water.  We initiated discussion about the need for an ICD prophylactically once he is improved from heart failure.  We also had a discussion about the potential of palliative care. Past Medical History:  Diagnosis Date  . Acute systolic heart failure (HCC)   . Arthritis   . COPD (chronic obstructive pulmonary disease) (HCC)   . Heart attack (HCC)   . Hypertension   . Peripheral neuropathy   . Pneumonia     Past Surgical History:  Procedure Laterality Date  . CARDIAC CATHETERIZATION    . CORONARY STENT INTERVENTION N/A 10/16/2018   Procedure: CORONARY STENT INTERVENTION;  Surgeon: Lennette BihariKelly, Thomas A, MD;  [facility];  Service: Cardiovascular;  Laterality: N/A;  . KNEE SURGERY  1994 & 1998  . LEFT HEART CATH AND CORONARY ANGIOGRAPHY N/A 10/16/2018   Procedure: LEFT HEART CATH AND CORONARY ANGIOGRAPHY;  Surgeon: Tresa EndoKelly,  Joyice Faster, MD;  Location: [facility] CV LAB;  Service: Cardiovascular;  Laterality: N/A;  . TONSILLECTOMY      Current Medications: Current Meds  Medication Sig  . acetaminophen (TYLENOL) 325 MG tablet Take 2 tablets (650 mg total) by mouth every 4 (four) hours as needed for headache or mild pain.  Marland Kitchen aspirin EC 81 MG tablet Take 81 mg by mouth daily.  Marland Kitchen atorvastatin (LIPITOR) 80 MG tablet Take 1 tablet (80 mg total) by mouth daily at 6 PM.  . carvedilol (COREG) 3.125 MG  tablet Take 1 tablet (3.125 mg total) by mouth 2 (two) times daily with a meal for 30 days.  . clonazePAM (KLONOPIN) 1 MG tablet Take 1 tablet (1 mg total) by mouth at bedtime.  Marland Kitchen FLUoxetine (PROZAC) 20 MG capsule Take 3 capsules (60 mg total) by mouth daily for 30 days.  . furosemide (LASIX) 80 MG tablet Take 1 tablet (80 mg total) by mouth daily for 30 days.  Marland Kitchen lamoTRIgine (LAMICTAL) 25 MG tablet Take 3 tablets (75 mg total) by mouth at bedtime.  Marland Kitchen losartan (COZAAR) 25 MG tablet Take 1 tablet (25 mg total) by mouth daily.  . metFORMIN (GLUCOPHAGE) 500 MG tablet Take 0.5 tablets (250 mg total) by mouth every morning.  . OXYGEN Inhale 4 L into the lungs continuous.  . pantoprazole (PROTONIX) 40 MG tablet Take 1 tablet (40 mg total) by mouth daily.  . potassium chloride SA (K-DUR) 20 MEQ tablet Take 2 tablets (40 mEq total) by mouth daily for 30 days.  Marland Kitchen spironolactone (ALDACTONE) 25 MG tablet Take 0.5 tablets (12.5 mg total) by mouth daily for 30 days.  . tamsulosin (FLOMAX) 0.4 MG CAPS capsule Take 1 capsule (0.4 mg total) by mouth at bedtime.  . ticagrelor (BRILINTA) 90 MG TABS tablet Take 1 tablet (90 mg total) by mouth 2 (two) times daily.     Allergies:   Patient has no known allergies.   Social History   Socioeconomic History  . Marital status: Widowed    Spouse name: Not on file  . Number of children: Not on file  . Years of education: Not on file  . Highest education level: Not on file  Occupational History  . Not on file  Social Needs  . Financial resource strain: Not hard at all  . Food insecurity    Worry: Never true    Inability: Never true  . Transportation needs    Medical: No    Non-medical: No  Tobacco Use  . Smoking status: Former Smoker    Packs/day: 2.00    Types: Cigarettes    Quit date: 12/16/2005    Years since quitting: 13.0  . Smokeless tobacco: Never Used  . Tobacco comment: quit in 2007  Substance and Sexual Activity  . Alcohol use: Not Currently   . Drug use: No  . Sexual activity: Not on file  Lifestyle  . Physical activity    Days per week: Not on file    Minutes per session: Not on file  . Stress: Not at all  Relationships  . Social Herbalist on phone: Not on file    Gets together: Not on file    Attends religious service: Not on file    Active member of club or organization: Not on file    Attends meetings of clubs or organizations: Not on file    Relationship status: Widowed  Other Topics Concern  . Not on file  Social History Narrative  . Not on file     Family History: The patient's family history includes Colon cancer in his mother; Heart failure in his mother; Kidney cancer in his father and mother; Thyroid disease in his sister. ROS:   Please see the history of present illness.    All other systems reviewed and are negative.  EKGs/Labs/Other Studies Reviewed:    The following studies were reviewed today:  EKG:  EKG ordered today and personally reviewed.  The ekg ordered today demonstrates   Echo 12/10/2018:  1. The left ventricle has moderately reduced systolic function, with an ejection fraction of 35%. The cavity size was normal. There is moderate concentric left ventricular hypertrophy. Left ventricular diastolic Doppler parameters are indeterminate.  2. The right ventricle has normal systolic function. The cavity was normal. There is no increase in right ventricular wall thickness.  3. The mitral valve is degenerative. There is mild to moderate mitral annular calcification present. No evidence of mitral valve stenosis.  4. Mild thickening of the aortic valve. Mild focal calcification of the aortic valve. Aortic valve regurgitation is mild by color flow Doppler.  5. 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  6. Stage 1: 1: Multiple segmental abnormalities exist. See findings.  Recent Labs: 12/16/2018: B Natriuretic Peptide 916.8 12/17/2018: ALT 15; TSH 3.195  12/25/2018: BUN 30; Creatinine, Ser 1.05; Hemoglobin 13.2; Magnesium 2.2; Platelets 410; Potassium 3.8; Sodium 139  Recent Lipid Panel    Component Value Date/Time   CHOL 191 10/16/2018 0250   TRIG 49 10/16/2018 0250   HDL 62 10/16/2018 0250   CHOLHDL 3.1 10/16/2018 0250   VLDL 10 10/16/2018 0250   LDLCALC 119 (H) 10/16/2018 0250    Physical Exam:    VS:  BP 100/60 (BP Location: Left Arm, Patient Position: Sitting, Cuff Size: Large)   Pulse 78   Temp (!) 97.1 F (36.2 C)   Ht 6' (1.829 m)   Wt 264 lb (119.7 kg)   SpO2 96%   BMI 35.80 kg/m     Wt Readings from Last 3 Encounters:  12/31/18 264 lb (119.7 kg)  12/24/18 258 lb 2.5 oz (117.1 kg)  11/19/18 270 lb (122.5 kg)     GEN: Looks chronically ill and his mood is depressed well nourished, well developed in no acute distress HEENT: Normal NECK: No JVD; No carotid bruits LYMPHATICS: No lymphadenopathy CARDIAC: Soft S1 no S3 RRR, no murmurs, rubs, gallops RESPIRATORY:  Clear to auscultation without rales, wheezing or rhonchi  ABDOMEN: Soft, non-tender, non-distended MUSCULOSKELETAL:  No edema; No deformity  SKIN: Warm and dry NEUROLOGIC:  Alert and oriented x 3 PSYCHIATRIC:  Normal affect    Signed, Norman Herrlich, MD  12/31/2018 10:29 AM    [facility]

## 2018-12-31 NOTE — Telephone Encounter (Addendum)
  [facility] Visit Follow Up Request   Date of Request (Industry):  December 31, 2018  Requesting Provider:  Dr. Shirlee More    Agency Requested:    [facility] Contact:  Joen Laura, [address], [place], [place]  [address] [email or web address]  Phone #: [phone] Fax #: [phone]  Patient Demographic Information: Name:  Justin Bradford. Age:  78 y.o.   DOB:  May 11, 1941  MRN:  [id number]   Home visit progress note(s), lab results, telemetry strips, etc were reviewed.  Provider Recommendations:  PMH: CAD s/p NSTEMI 10/16/18 with PCI/DES LAD. CHF 35%.   Interval history:  Virtual visit with Dr. Bettina Gavia 11/19/18  Seen by [facility] 11/21/18 with REDS 35%  Admitted to [facility] 12/16/18-12/25/18 for PNA, acute on chronic systolic CHF (negative COVID)  12/31/18 seen by Dr. Bettina Gavia for follow up  Would like careful follow up of his heart failure after recent hospitalization and subsequent medication changes 12/31/2018.   Education on sodium and fluid restriction (2-3L).   Education on daily weights, daily BP, and signs/symptoms of heart failure.   Follow up home services requested:  Vital Signs (BP, Pulse, O2, Weight)  Physical Exam  ReDS Heart Failure Measurement  Assess for Other Rehab/Nursing Needs **please repeat ReDS Vest and assessment 1 time per week for 3 weeks.  *   **Of note, it looks like he is already set up for RN, PT, OT services at time of hospital discharge - would like to add heart failure measurements and assessment findings.   N/A - All labs ordered for this home visit have been released and the request was sent to Chrissie Noa at [facility].   Office visit note 12/31/2018 and hospital discharge summary was included with this encounter and sent to Joen Laura of [facility] via the [employer] function.

## 2019-01-01 LAB — BASIC METABOLIC PANEL
BUN/Creatinine Ratio: 12 (ref 10–24)
BUN: 14 mg/dL (ref 8–27)
CO2: 23 mmol/L (ref 20–29)
Calcium: 9 mg/dL (ref 8.6–10.2)
Chloride: 95 mmol/L — ABNORMAL LOW (ref 96–106)
Creatinine, Ser: 1.15 mg/dL (ref 0.76–1.27)
GFR calc Af Amer: 71 mL/min/{1.73_m2} (ref >59)
GFR calc non Af Amer: 61 mL/min/{1.73_m2} (ref >59)
Glucose: 135 mg/dL — ABNORMAL HIGH (ref 65–99)
Potassium: 4.5 mmol/L (ref 3.5–5.2)
Sodium: 136 mmol/L (ref 134–144)

## 2019-01-01 LAB — PRO B NATRIURETIC PEPTIDE: NT-Pro BNP: 2784 pg/mL — ABNORMAL HIGH (ref 0–486)

## 2019-01-05 ENCOUNTER — Telehealth: Payer: Self-pay | Admitting: Cardiology

## 2019-01-05 NOTE — Telephone Encounter (Signed)
Patient called stating he has a horrible toothache but his dentist wanted him to check with his heart doctor to see if he can have dental work done,  He just had a heart attack in April. Pease call patient to discuss [phone]

## 2019-01-05 NOTE — Telephone Encounter (Signed)
Patient advised to be evaluated by his dentist to see if they feel a dental procedure is needed. If dental work is needed, patient can call back and inform us of the details for Dr. Bettina Gavia to advise. Patient verbalized understanding. No further questions.

## 2019-01-05 NOTE — Telephone Encounter (Signed)
I would advise him to go see the dentist and then will decide what to do depending on what the problem is

## 2019-01-05 NOTE — Telephone Encounter (Signed)
Please advise. Thanks.  

## 2019-01-06 NOTE — Telephone Encounter (Signed)
Patient is at the dental office now and his dentist is wanting to pull one of his teeth. He is planning to wait at the dental office to here from [place] with an answer and if possible go ahead and proceed with this now. Please advise. Thanks!

## 2019-01-06 NOTE — Telephone Encounter (Signed)
He can proceed with extraction but cannot stop his antiplatelet drugs and I would not give him epinephrine with lidocaine.

## 2019-01-06 NOTE — Telephone Encounter (Signed)
Patient and dentist, Dr. Joneen Caraway, informed and verbalized understanding. No further questions.

## 2019-01-09 ENCOUNTER — Telehealth: Payer: Self-pay | Admitting: Cardiology

## 2019-01-09 NOTE — Telephone Encounter (Signed)
Patient is taking one Entresto per dayk but pharmacist says he should be taking two per day. Please advise.

## 2019-01-09 NOTE — Telephone Encounter (Signed)
Patient informed to take entresto 24-26 mg 1 tablet daily in the middle of the day per Dr. Bettina Gavia. Patient verbalized understanding. No further questions.

## 2019-01-21 NOTE — Progress Notes (Signed)
Cardiology Office Note:    Date:  01/22/2019   ID:  Justin PoseyBruce D Schildt Jr., DOB 03/28/1941, MRN [id number]  PCP:  Forrest Moronuehle, Stephen, MD  Cardiologist:  Norman HerrlichBrian Haskell Rihn, MD    Referring MD: Forrest Moronuehle, Stephen, MD    ASSESSMENT:    1. Chronic systolic heart failure (HCC)   2. Ischemic cardiomyopathy   3. CAD in native artery   4. Hypertensive heart disease with heart failure (HCC)    PLAN:    In order of problems listed above:  1. Heart failure is improved he has progressed [place] Heart Association class III to class I to class II.  Physical therapy at home has helped him and he is going to begin to ambulate outdoors.  He has no edema orthopnea chest pain palpitation or syncope but is short of breath with his exercise.  Continue his current loop diuretic he will double dose for weight gain along with beta-blocker Entresto spironolactone and with soft blood pressure I am not can uptitrate.  He had labs done 1 week ago requested from his PCP office he tells me his kidney potassium and hemoglobin were good 2. He has severe left ventricular dysfunction post MI with heart failure at high risk and would benefit from ICD and referred to EP as I think that he is improved to the point where he is a candidate for elective ICD therapy 3. Stable CAD continue medical treatment including dual antiplatelet 4. Stable blood pressure somewhat soft continue his current medications no call me if he is consistently less than 100   Next appointment: 6 weeks   Medication Adjustments/Labs and Tests Ordered: Current medicines are reviewed at length with the patient today.  Concerns regarding medicines are outlined above.  Orders Placed This Encounter  Procedures   Ambulatory referral to Cardiac Electrophysiology   Meds ordered this encounter  Medications   nitroGLYCERIN (NITROSTAT) 0.4 MG SL tablet    Sig: Place 1 tablet (0.4 mg total) under the tongue every 5 (five) minutes as needed for chest pain.   Dispense:  25 tablet    Refill:  3   furosemide (LASIX) 80 MG tablet    Sig: Take 1 tablet (80 mg total) by mouth daily. Take additional 80 mg if weight is greater than 266lb    Dispense:  30 tablet    Refill:  0   ticagrelor (BRILINTA) 90 MG TABS tablet    Sig: Take 1 tablet (90 mg total) by mouth 2 (two) times daily.    Dispense:  180 tablet    Refill:  1    Chief Complaint  Patient presents with   Follow-up   Congestive Heart Failure    History of Present Illness:    Justin PoseyBruce D Matte Jr. is a 78 y.o. male with a hx of CAD s/p NSTEMI 10/16/2018 with PCI/DES to LAD, chronic combined CHF (EF 35% 10/2018), HTN, HLD, DM type 2, COPD, and [facility] with pneumonia and decompensated heart failure. He was last seen 12/31/2018. Compliance with diet, lifestyle and medications: Yes  He is improved he now can do his ADLs ambulate in the home has no edema orthopnea chest pain palpitation or syncope and is short of breath when he does physical therapy but not severe sustained.  He has good healthcare literacy his weight is stable and compliant with sodium restriction and medications Past Medical History:  Diagnosis Date   Acute systolic heart failure (HCC)    Arthritis  COPD (chronic obstructive pulmonary disease) (HCC)    Heart attack (HCC)    Hypertension    Peripheral neuropathy    Pneumonia     Past Surgical History:  Procedure Laterality Date   CARDIAC CATHETERIZATION     CORONARY STENT INTERVENTION N/A 10/16/2018   Procedure: CORONARY STENT INTERVENTION;  Surgeon: Lennette Bihari, MD;  Location: [facility] INVASIVE CV LAB;  Service: Cardiovascular;  Laterality: N/A;   KNEE SURGERY  1994 & 1998   LEFT HEART CATH AND CORONARY ANGIOGRAPHY N/A 10/16/2018   Procedure: LEFT HEART CATH AND CORONARY ANGIOGRAPHY;  Surgeon: Lennette Bihari, MD;  Location: [facility] INVASIVE CV LAB;  Service: Cardiovascular;  Laterality: N/A;   TONSILLECTOMY      Current  Medications: Current Meds  Medication Sig   acetaminophen (TYLENOL) 325 MG tablet Take 2 tablets (650 mg total) by mouth every 4 (four) hours as needed for headache or mild pain.   Albuterol Sulfate 108 (90 Base) MCG/ACT AEPB Inhale 1 puff into the lungs every 4 (four) hours as needed.   aspirin EC 81 MG tablet Take 81 mg by mouth daily.   atorvastatin (LIPITOR) 80 MG tablet Take 1 tablet (80 mg total) by mouth daily at 6 PM.   carvedilol (COREG) 3.125 MG tablet Take 1 tablet (3.125 mg total) by mouth 2 (two) times daily with a meal for 30 days.   clonazePAM (KLONOPIN) 1 MG tablet Take 1 tablet (1 mg total) by mouth at bedtime.   FLUoxetine (PROZAC) 20 MG capsule Take 3 capsules (60 mg total) by mouth daily for 30 days.   fluticasone furoate-vilanterol (BREO ELLIPTA) 100-25 MCG/INH AEPB Inhale 1 puff into the lungs daily.   furosemide (LASIX) 80 MG tablet Take 1 tablet (80 mg total) by mouth daily. Take additional 80 mg if weight is greater than 266lb   lamoTRIgine (LAMICTAL) 25 MG tablet Take 3 tablets (75 mg total) by mouth at bedtime.   metFORMIN (GLUCOPHAGE) 500 MG tablet Take 0.5 tablets (250 mg total) by mouth every morning.   OXYGEN Inhale 4 L into the lungs continuous.   pantoprazole (PROTONIX) 40 MG tablet Take 1 tablet (40 mg total) by mouth daily.   potassium chloride SA (K-DUR) 20 MEQ tablet Take 2 tablets (40 mEq total) by mouth daily for 30 days.   sacubitril-valsartan (ENTRESTO) 24-26 MG Take 1 tablet by mouth 2 (two) times daily.   spironolactone (ALDACTONE) 25 MG tablet Take 0.5 tablets (12.5 mg total) by mouth daily.   tamsulosin (FLOMAX) 0.4 MG CAPS capsule Take 1 capsule (0.4 mg total) by mouth at bedtime.   ticagrelor (BRILINTA) 90 MG TABS tablet Take 1 tablet (90 mg total) by mouth 2 (two) times daily.   [DISCONTINUED] furosemide (LASIX) 80 MG tablet Take 1 tablet (80 mg total) by mouth daily for 30 days.   [DISCONTINUED] ticagrelor (BRILINTA) 90 MG  TABS tablet Take 1 tablet (90 mg total) by mouth 2 (two) times daily.     Allergies:   Patient has no known allergies.   Social History   Socioeconomic History   Marital status: Widowed    Spouse name: Not on file   Number of children: Not on file   Years of education: Not on file   Highest education level: Not on file  Occupational History   Not on file  Social Needs   Financial resource strain: Not hard at all   Food insecurity    Worry: Never true    Inability: Never  true   Transportation needs    Medical: No    Non-medical: No  Tobacco Use   Smoking status: Former Smoker    Packs/day: 2.00    Types: Cigarettes    Quit date: 12/16/2005    Years since quitting: 13.1   Smokeless tobacco: Never Used   Tobacco comment: quit in 2007  Substance and Sexual Activity   Alcohol use: Not Currently   Drug use: No   Sexual activity: Not on file  Lifestyle   Physical activity    Days per week: Not on file    Minutes per session: Not on file   Stress: Not at all  Relationships   Social connections    Talks on phone: Not on file    Gets together: Not on file    Attends religious service: Not on file    Active member of club or organization: Not on file    Attends meetings of clubs or organizations: Not on file    Relationship status: Widowed  Other Topics Concern   Not on file  Social History Narrative   Not on file     Family History: The patient's family history includes Colon cancer in his mother; Heart failure in his mother; Kidney cancer in his father and mother; Thyroid disease in his sister. ROS:   Please see the history of present illness.    All other systems reviewed and are negative.  EKGs/Labs/Other Studies Reviewed:    The following studies were reviewed today:    Echo 12/10/2018: 1. The left ventricle has moderately reduced systolic function, with an ejection fraction of 35%. The cavity size was normal. There is moderate concentric  left ventricular hypertrophy. Left ventricular diastolic Doppler parameters are indeterminate. 2. The right ventricle has normal systolic function. The cavity was normal. There is no increase in right ventricular wall thickness. 3. The mitral valve is degenerative. There is mild to moderate mitral annular calcification present. No evidence of mitral valve stenosis. 4. Mild thickening of the aortic valve. Mild focal calcification of the aortic valve. Aortic valve regurgitation is mild by color flow Doppler. 5. 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  Recent Labs: 12/16/2018: B Natriuretic Peptide 916.8 12/17/2018: ALT 15; TSH 3.195 12/25/2018: Hemoglobin 13.2; Magnesium 2.2; Platelets 410 12/31/2018: BUN 14; Creatinine, Ser 1.15; NT-Pro BNP 2,784; Potassium 4.5; Sodium 136  Recent Lipid Panel    Component Value Date/Time   CHOL 191 10/16/2018 0250   TRIG 49 10/16/2018 0250   HDL 62 10/16/2018 0250   CHOLHDL 3.1 10/16/2018 0250   VLDL 10 10/16/2018 0250   LDLCALC 119 (H) 10/16/2018 0250    Physical Exam:    VS:  BP 102/60    Pulse 80    Temp 98.7 F (37.1 C)    Ht 6' (1.829 m)    Wt 263 lb 12.8 oz (119.7 kg)    SpO2 97%    BMI 35.78 kg/m     Wt Readings from Last 3 Encounters:  01/22/19 263 lb 12.8 oz (119.7 kg)  12/31/18 264 lb (119.7 kg)  12/24/18 258 lb 2.5 oz (117.1 kg)     GEN: He still looks chronically ill but not nearly as frail and debilitated as his previous visit  in no acute distress HEENT: Normal NECK: No JVD; No carotid bruits LYMPHATICS: No lymphadenopathy CARDIAC: RRR, no murmurs, rubs, gallops RESPIRATORY:  Clear to auscultation without rales, wheezing or rhonchi  ABDOMEN: Soft, non-tender, non-distended MUSCULOSKELETAL:  No edema; No deformity  SKIN: Warm and dry NEUROLOGIC:  Alert and oriented x 3 PSYCHIATRIC:  Normal affect    Signed, Norman HerrlichBrian River Mckercher, MD  01/22/2019 5:02 PM    [facility]

## 2019-01-22 ENCOUNTER — Encounter: Payer: Self-pay | Admitting: Cardiology

## 2019-01-22 ENCOUNTER — Ambulatory Visit (INDEPENDENT_AMBULATORY_CARE_PROVIDER_SITE_OTHER): Payer: Medicare Other | Admitting: Cardiology

## 2019-01-22 ENCOUNTER — Other Ambulatory Visit: Payer: Self-pay

## 2019-01-22 VITALS — BP 102/60 | HR 80 | Temp 98.7°F | Ht 72.0 in | Wt 263.8 lb

## 2019-01-22 DIAGNOSIS — I110 Hypertensive heart disease with heart failure: Secondary | ICD-10-CM

## 2019-01-22 DIAGNOSIS — I255 Ischemic cardiomyopathy: Secondary | ICD-10-CM | POA: Diagnosis not present

## 2019-01-22 DIAGNOSIS — I2510 Atherosclerotic heart disease of native coronary artery without angina pectoris: Secondary | ICD-10-CM | POA: Diagnosis not present

## 2019-01-22 DIAGNOSIS — I5022 Chronic systolic (congestive) heart failure: Secondary | ICD-10-CM

## 2019-01-22 MED ORDER — FUROSEMIDE 80 MG PO TABS: 80.0000 mg | ORAL_TABLET | Freq: Every day | ORAL | 0 refills | Status: DC

## 2019-01-22 MED ORDER — TICAGRELOR 90 MG PO TABS: 90.0000 mg | ORAL_TABLET | Freq: Two times a day (BID) | ORAL | 1 refills | Status: DC

## 2019-01-22 MED ORDER — NITROGLYCERIN 0.4 MG SL SUBL: 0.4000 mg | SUBLINGUAL_TABLET | SUBLINGUAL | 3 refills | Status: DC | PRN

## 2019-01-22 NOTE — Patient Instructions (Signed)
Medication Instructions:  Nitroglycerin 0.4 mg sublingual (under your tongue) as needed for chest pain. If experiencing chest pain, stop what you are doing and sit down. Take 1 nitroglycerin and wait 5 minutes. If chest pain continues, take another nitroglycerin and wait 5 minutes. If chest pain does not subside, take 1 more nitroglycerin and dial 911. You make take a total of 3 nitroglycerin in a 15 minute time frame.  Take an additional Lasix 80 mg if weight is greater than 266  If you need a refill on your cardiac medications before your next appointment, please call your pharmacy.   Lab work: Your physician recommends that you return for lab work in:   Get labs done at Dr. Daniel Nones and have them fax to [place]  If you have labs (blood work) drawn today and your tests are completely normal, you will receive your results only by: Marland Kitchen MyChart Message (if you have MyChart) OR . A paper copy in the mail If you have any lab test that is abnormal or we need to change your treatment, we will call you to review the results.  Testing/Procedures: None  Follow-Up: At [facility], you and your health needs are our priority.  As part of our continuing mission to provide you with exceptional heart care, we have created designated Provider Care Teams.  These Care Teams include your primary Cardiologist (physician) and Advanced Practice Providers (APPs -  Physician Assistants and Nurse Practitioners) who all work together to provide you with the care you need, when you need it. You will need a follow up appointment in 6 weeks. Any Other Special Instructions Will Be Listed Below (If Applicable).  YOU ARE BEING REFERRED TO DR. CAMNITZ(ELECTROPHYSIOLOGIST) FOR POTENTIAL ICD PLACEMENT. THEY WILL CONTACT YOU WITH A DATE AND TIME FOR AN APPOINTMENT.

## 2019-02-09 ENCOUNTER — Encounter: Payer: Self-pay | Admitting: Cardiology

## 2019-02-09 ENCOUNTER — Ambulatory Visit (INDEPENDENT_AMBULATORY_CARE_PROVIDER_SITE_OTHER): Payer: Medicare Other | Admitting: Cardiology

## 2019-02-09 ENCOUNTER — Other Ambulatory Visit: Payer: Self-pay

## 2019-02-09 VITALS — BP 102/62 | HR 80 | Ht 72.0 in | Wt 264.0 lb

## 2019-02-09 DIAGNOSIS — I255 Ischemic cardiomyopathy: Secondary | ICD-10-CM | POA: Diagnosis not present

## 2019-02-09 NOTE — Patient Instructions (Addendum)
Medication Instructions:  Your physician recommends that you continue on your current medications as directed. Please refer to the Current Medication list given to you today.     * If you need a refill on your cardiac medications before your next appointment, please call your pharmacy. *   Labwork: None ordered   Testing/Procedures: Your physician has recommended that you have a defibrillator inserted. An implantable cardioverter defibrillator (ICD) is a small device that is placed in your chest or, in rare cases, your abdomen. This device uses electrical pulses or shocks to help control life-threatening, irregular heartbeats that could lead the heart to suddenly stop beating (sudden cardiac arrest). Leads are attached to the ICD that goes into your heart. This is done in the hospital and usually requires an overnight stay. Please follow the instructions below, located under the special instructions section.   Follow-Up: Your physician recommends that you schedule a wound check appointment 10-14 days, after your procedure on ________, with the device clinic.  Your physician recommends that you schedule a follow up appointment in 91 days, after your procedure on _________, with Dr. Elberta Fortisamnitz.  * Please note that any paperwork needing to be filled out by the provider will need to be addressed at the front desk prior to seeing the provider.  Please note that any FMLA, disability or other documents regarding health condition is subject to a $25.00 charge that must be received prior to completion of paperwork in the form of a money order or check. *  Thank you for choosing [facility]!!   Dory HornSherri Yvett Rossel, RN [phone]   Any Other Special Instructions Will Be Listed Below (If Applicable).     Implantable Device Instructions  You are scheduled for:                  _____ Implantable Cardioverter Defibrillator  on  ________  with Dr. Elberta Fortisamnitz.  1.   [facility]  [facility] at  __________ on the day of your procedure. (The address is [address])  2. Do not eat or drink after midnight the night before your procedure.  3.   Complete pre procedure  lab work on ___________.  The lab at [facility] is open from 8:00 AM to 4:30 PM.  You do not have to be fasting.  4.   Medication instructions: nurse will go over these once procedure is scheduled.  5.  Plan for an overnight stay.  Bring your insurance cards and a list of you medications.  6.  Wash your chest and neck with surgical scrub the evening before and the morning of  your procedure.  Rinse well. Please review the surgical scrub instruction sheet given to you.  7. Your chest will need to be shaved prior to this procedure (if needed). We ask that you do this yourself at home 1 to 2 days before or if uncomfortable/unable to do yourself, then it will be performed by the hospital staff the day of.                                                                                                                *  If you have ANY questions after you get home, please call Trinidad Curet, RN @ [phone].  * Every attempt is made to prevent procedures from being rescheduled.  Due to the nature of  Electrophysiology, rescheduling can happen.  The physician is always aware and directs the staff when this occurs.      [place] - Preparing For Surgery Before surgery, you can play an important role. Because skin is not sterile, your skin needs to be as free of germs as possible. You can reduce the number of germs on your skin by washing with CHG (chlorahexidine gluconate) Soap before surgery.  CHG is an antiseptic cleaner which kills germs and bonds with the skin to continue killing germs even after washing.   Please do not use if you have an allergy to CHG or antibacterial soaps.  If your skin becomes reddened/irritated stop using the CHG.   Do not shave  (including legs and underarms) for at least 48 hours prior to first CHG shower.  It is OK to shave your face.  Please follow these instructions carefully:  1.  Shower the night before surgery and the morning of surgery with CHG.  2.  If you choose to wash your hair, wash your hair first as usual with your normal shampoo.  3.  After you shampoo, rinse your hair and body thoroughly to remove the shampoo.  4.  Use CHG as you would any other liquid soap.  You can apply CHG directly to the skin and wash gently with a clean washcloth. 5.  Apply the CHG Soap to your body ONLY FROM THE NECK DOWN.  Do not use on open wounds or open sores.  Avoid contact with your eyes, ears, mouth and genitals (private parts).  Wash genitals (private parts) with your normal soap.  6.  Wash thoroughly, paying special attention to the area where your surgery will be performed.  7.  Thoroughly rise your body with warm water from the neck down.   8.  DO NOT shower/wash with your normal soap after using and rinsing off the CHG soap.  9.  Pat yourself dry with a clean towel.           10.  Wear clean pajamas.           11.  Place clean sheets on your bed the night of your first shower and do not sleep with pets.  Day of Surgery: Do not apply any deodorants/lotions.  Please wear clean clothes to the hospital/surgery center.      Cardioverter Defibrillator Implantation An implantable cardioverter defibrillator (ICD) is a small, lightweight, battery-powered device that is placed (implanted) under the skin in the chest or abdomen. Your caregiver may prescribe an ICD if:  You have had an irregular heart rhythm (arrhythmia) that originated in the lower chambers of the heart (ventricles).  Your heart has been damaged by a disease (such as coronary artery disease) or heart condition (such as a heart attack). An ICD consists of a battery that lasts several years, a small computer called a pulse generator, and wires called leads  that go into the heart. It is used to detect and correct two dangerous arrhythmias: a rapid heart rhythm (tachycardia) and an arrhythmia in which the ventricles contract in an uncoordinated way (fibrillation). When an ICD detects tachycardia, it sends an electrical signal to the heart that restores the heartbeat to normal (cardioversion). This signal is usually painless. If cardioversion does not work or  if the ICD detects fibrillation, it delivers a small electrical shock to the heart (defibrillation) to restart the heart. The shock may feel like a strong jolt in the chest. ICDs may be programmed to correct other problems. Sometimes, ICDs are programmed to act as another type of implantable device called a pacemaker. Pacemakers are used to treat a slow heartbeat (bradycardia). LET YOUR CAREGIVER KNOW ABOUT:  Any allergies you have.  All medicines you are taking, including vitamins, herbs, eyedrops, and over-the-counter medicines and creams.  Previous problems you or members of your family have had with the use of anesthetics.  Any blood disorders you have had.  Other health problems you have. RISKS AND COMPLICATIONS Generally, the procedure to implant an ICD is safe. However, as with any surgical procedure, complications can occur. Possible complications associated with implanting an ICD include:  Swelling, bleeding, or bruising at the site where the ICD was implanted.  Infection at the site where the ICD was implanted.  A reaction to medicine used during the procedure.  Nerve, heart, or blood vessel damage.  Blood clots. BEFORE THE PROCEDURE  You may need to have blood tests, heart tests, or a chest X-ray done before the day of the procedure.  Ask your caregiver about changing or stopping your regular medicines.  Make plans to have someone drive you home. You may need to stay in the hospital overnight after the procedure.  Stop smoking at least 24 hours before the procedure.  Take  a bath or shower the night before the procedure. You may need to scrub your chest or abdomen with a special type of soap.  Do not eat or drink before your procedure for as long as directed by your caregiver. Ask if it is okay to take any needed medicine with a small sip of water. PROCEDURE  The procedure to implant an ICD in your chest or abdomen is usually done at a hospital in a room that has a large X-ray machine called a fluoroscope. The machine will be above you during the procedure. It will help your caregiver see your heart during the procedure. Implanting an ICD usually takes 1-3 hours. Before the procedure:   Small monitors will be put on your body. They will be used to check your heart, blood pressure, and oxygen level.  A needle will be put into a vein in your hand or arm. This is called an intravenous (IV) access tube. Fluids and medicine will flow directly into your body through the IV tube.  Your chest or abdomen will be cleaned with a germ-killing (antiseptic) solution. The area may be shaved.  You may be given medicine to help you relax (sedative).  You will be given a medicine called a local anesthetic. This medicine will make the surgical site numb while the ICD is implanted. You will be sleepy but awake during the procedure. After you are numb the procedure will begin. The caregiver will:  Make a small cut (incision). This will make a pocket deep under your skin that will hold the pulse generator.  Guide the leads through a large blood vessel into your heart and attach them to the heart muscles. Depending on the ICD, the leads may go into one ventricle or they may go to both ventricles and into an upper chamber of the heart (atrium).  Test the ICD.  Close the incision with stitches, glue, or staples. AFTER THE PROCEDURE  You may feel pain. Some pain is normal. It may last  a few days.  You may stay in a recovery area until the local anesthetic has worn off. Your blood  pressure and pulse will be checked often. You will be taken to a room where your heart will be monitored.  A chest X-ray will be taken. This is done to check that the cardioverter defibrillator is in the right place.  You may stay in the hospital overnight.  A slight bump may be seen over the skin where the ICD was placed. Sometimes, it is possible to feel the ICD under the skin. This is normal.  In the months and years afterward, your caregiver will check the device, the leads, and the battery every few months. Eventually, when the battery is low, the ICD will be replaced.   This information is not intended to replace advice given to you by your health care provider. Make sure you discuss any questions you have with your health care provider.   Document Released: 03/10/2002 Document Revised: 04/08/2013 Document Reviewed: 07/07/2012 Elsevier Interactive Patient Education 2016 [employer]    Cardioverter Defibrillator Implantation, Care After This sheet gives you information about how to care for yourself after your procedure. Your health care provider may also give you more specific instructions. If you have problems or questions, contact your health care provider. What can I expect after the procedure? After the procedure, it is common to have:  Some pain. It may last a few days.  A slight bump over the skin where the device was placed. Sometimes, it is possible to feel the device under the skin. This is normal.  During the months and years after your procedure, your health care provider will check the device, the leads, and the battery every few months. Eventually, when the battery is low, the device will be replaced. Follow these instructions at home: Medicines  Take over-the-counter and prescription medicines only as told by your health care provider.  If you were prescribed an antibiotic medicine, take it as told by your health care provider. Do not stop taking the antibiotic  even if you start to feel better. Incision care   Follow instructions from your health care provider about how to take care of your incision area. Make sure you: ? Wash your hands with soap and water before you change your bandage (dressing). If soap and water are not available, use hand sanitizer. ? Change your dressing as told by your health care provider. ? Leave stitches (sutures), skin glue, or adhesive strips in place. These skin closures may need to stay in place for 2 weeks or longer. If adhesive strip edges start to loosen and curl up, you may trim the loose edges. Do not remove adhesive strips completely unless your health care provider tells you to do that.  Check your incision area every day for signs of infection. Check for: ? More redness, swelling, or pain. ? More fluid or blood. ? Warmth. ? Pus or a bad smell.  Do not use lotions or ointments near the incision area unless told by your health care provider.  Keep the incision area clean and dry for 2-3 days after the procedure or for as long as told by your health care provider. It takes several weeks for the incision site to heal completely.  Do not take baths, swim, or use a hot tub until your health care provider approves. Activity  Try to walk a little every day. Exercising is important after this procedure. Also, use your shoulder on the  side of the defibrillator in daily tasks that do not require a lot of motion.  For at least 6 weeks: ? Do not lift your upper arm above your shoulders. This means no tennis, golf, or swimming for this period of time. If you tend to sleep with your arm above your head, use a restraint to prevent this during sleep. ? Avoid sudden jerking, pulling, or chopping movements that pull your upper arm far away from your body.  Ask your health care provider when you may go back to work.  Check with your health care provider before you start to drive or play sports. Electric and magnetic  fields  Tell all health care providers that you have a defibrillator. This may prevent them from giving you an MRI scan because strong magnets are used for that test.  If you must pass through a metal detector, quickly walk through it. Do not stop under the detector, and do not stand near it.  Avoid places or objects that have a strong electric or magnetic field, including: ? [employer]. At the airport, let officials know that you have a defibrillator. Your defibrillator ID card will let you be checked in a way that is safe for you and will not damage your defibrillator. Also, do not let a security person wave a magnetic wand near your defibrillator. That can make it stop working. ? Power plants. ? Large electrical generators. ? Anti-theft systems or electronic article surveillance (EAS). ? Radiofrequency transmission towers, such as cell phone and radio towers.  Do not use amateur (ham) radio equipment or electric (arc) welding torches. Some devices are safe to use if held at least 12 inches (30 cm) from your defibrillator. These include power tools, lawn mowers, and speakers. If you are unsure if something is safe to use, ask your health care provider.  Do not use MP3 player headphones. They have magnets.  You may safely use electric blankets, heating pads, computers, and microwave ovens.  When using your cell phone, hold it to the ear that is on the opposite side from the defibrillator. Do not leave your cell phone in a pocket over the defibrillator. General instructions  Follow diet instructions from your health care provider, if this applies.  Always keep your defibrillator ID card with you. The card should list the implant date, device model, and manufacturer. Consider wearing a medical alert bracelet or necklace.  Have your defibrillator checked every 3-6 months or as often as told by your health care provider. Most defibrillators last for 4-8 years.  Keep all follow-up  visits as told by your health care provider. This is important for your health care provider to make sure your chest is healing the way it should. Ask your health care provider when you should come back to have your stitches or staples taken out. Contact a health care provider if:  You feel one shock in your chest.  You gain weight suddenly.  Your legs or feet swell more than they have before.  It feels like your heart is fluttering or skipping beats (heart palpitations).  You have more redness, swelling, or pain around your incision.  You have more fluid or blood coming from your incision.  Your incision feels warm to the touch.  You have pus or a bad smell coming from your incision.  You have a fever. Get help right away if:  You have chest pain.  You feel more than one shock.  You feel more  short of breath than you have felt before.  You feel more light-headed than you have felt before.  Your incision starts to open up. This information is not intended to replace advice given to you by your health care provider. Make sure you discuss any questions you have with your health care provider. Document Released: 01/05/2005 Document Revised: 01/06/2016 Document Reviewed: 11/23/2015 Elsevier Interactive Patient Education  2018 [employer].     Supplemental Discharge Instructions for  Pacemaker/Defibrillator Patients  ACTIVITY No heavy lifting or vigorous activity with your left/right arm for 6 to 8 weeks.  Do not raise your left/right arm above your head for one week.  Gradually raise your affected arm as drawn below.           __  NO DRIVING for     ; you may begin driving on     .  WOUND CARE - Keep the wound area clean and dry.  Do not get this area wet for one week. No showers for one week; you may shower on     . - The tape/steri-strips on your wound will fall off; do not pull them off.  No bandage is needed on the site.  DO  NOT apply any creams, oils, or  ointments to the wound area. - If you notice any drainage or discharge from the wound, any swelling or bruising at the site, or you develop a fever > 101? F after you are discharged home, call the office at once.  SPECIAL INSTRUCTIONS - You are still able to use cellular telephones; use the ear opposite the side where you have your pacemaker/defibrillator.  Avoid carrying your cellular phone near your device. - When traveling through airports, show security personnel your identification card to avoid being screened in the metal detectors.  Ask the security personnel to use the hand wand. - Avoid arc welding equipment, MRI testing (magnetic resonance imaging), TENS units (transcutaneous nerve stimulators).  Call the office for questions about other devices. - Avoid electrical appliances that are in poor condition or are not properly grounded. - Microwave ovens are safe to be near or to operate.  ADDITIONAL INFORMATION FOR DEFIBRILLATOR PATIENTS SHOULD YOUR DEVICE GO OFF: - If your device goes off ONCE and you feel fine afterward, notify the device clinic nurses. - If your device goes off ONCE and you do not feel well afterward, call 911. - If your device goes off TWICE, call 911. - If your device goes off THREE TIMES IN ONE DAY, call 911.  DO NOT DRIVE YOURSELF OR A FAMILY MEMBER WITH A DEFIBRILLATOR TO THE HOSPITAL--CALL 911.

## 2019-02-09 NOTE — Progress Notes (Signed)
Electrophysiology Office Note   Date:  02/09/2019   ID:  Justin Kloos., DOB 1940-12-02, MRN [id number]  PCP:  Forrest Moron, MD  Cardiologist: Dulce Sellar Primary Electrophysiologist:  Will Jorja Loa, MD    No chief complaint on file.    History of Present Illness: Justin Durocher. is a 78 y.o. male who is being seen today for the evaluation of ischemic cardiomyopathy at the request of Norman Herrlich. Presenting today for electrophysiology evaluation.  He has a history significant for ischemic cardiomyopathy, hypertension, coronary artery disease.  He had a late presentation of an anterior MI with a 100% occluded LAD in August 2020.  During that time, he developed atrial fibrillation with rapid rates.  He was initially put on amiodarone, but has had no further recurrences.  Repeat echocardiogram shows an ejection fraction of 35%.  Today, he denies symptoms of palpitations, chest pain, shortness of breath, orthopnea, PND, lower extremity edema, claudication, dizziness, presyncope, syncope, bleeding, or neurologic sequela. The patient is tolerating medications without difficulties.    Past Medical History:  Diagnosis Date  . Acute systolic heart failure (HCC)   . Arthritis   . COPD (chronic obstructive pulmonary disease) (HCC)   . Heart attack (HCC)   . Hypertension   . Peripheral neuropathy   . Pneumonia    Past Surgical History:  Procedure Laterality Date  . CARDIAC CATHETERIZATION    . CORONARY STENT INTERVENTION N/A 10/16/2018   Procedure: CORONARY STENT INTERVENTION;  Surgeon: Lennette Bihari, MD;  Location: [facility] INVASIVE CV LAB;  Service: Cardiovascular;  Laterality: N/A;  . KNEE SURGERY  1994 & 1998  . LEFT HEART CATH AND CORONARY ANGIOGRAPHY N/A 10/16/2018   Procedure: LEFT HEART CATH AND CORONARY ANGIOGRAPHY;  Surgeon: Lennette Bihari, MD;  Location: [facility] INVASIVE CV LAB;  Service: Cardiovascular;  Laterality: N/A;  . TONSILLECTOMY       Current Outpatient  Medications  Medication Sig Dispense Refill  . acetaminophen (TYLENOL) 325 MG tablet Take 2 tablets (650 mg total) by mouth every 4 (four) hours as needed for headache or mild pain.    . Albuterol Sulfate 108 (90 Base) MCG/ACT AEPB Inhale 1 puff into the lungs every 4 (four) hours as needed.    Marland Kitchen aspirin EC 81 MG tablet Take 81 mg by mouth daily.    Marland Kitchen atorvastatin (LIPITOR) 80 MG tablet Take 1 tablet (80 mg total) by mouth daily at 6 PM. 30 tablet 0  . carvedilol (COREG) 3.125 MG tablet Take 1 tablet (3.125 mg total) by mouth 2 (two) times daily with a meal for 30 days. 60 tablet 0  . clonazePAM (KLONOPIN) 1 MG tablet Take 1 tablet (1 mg total) by mouth at bedtime. 20 tablet 0  . FLUoxetine (PROZAC) 20 MG capsule Take 3 capsules (60 mg total) by mouth daily for 30 days. 90 capsule 0  . fluticasone furoate-vilanterol (BREO ELLIPTA) 100-25 MCG/INH AEPB Inhale 1 puff into the lungs daily.    . furosemide (LASIX) 80 MG tablet Take 1 tablet (80 mg total) by mouth daily. Take additional 80 mg if weight is greater than 266lb 30 tablet 0  . lamoTRIgine (LAMICTAL) 25 MG tablet Take 3 tablets (75 mg total) by mouth at bedtime. 30 tablet 0  . metFORMIN (GLUCOPHAGE) 500 MG tablet Take 0.5 tablets (250 mg total) by mouth every morning. 30 tablet 0  . nitroGLYCERIN (NITROSTAT) 0.4 MG SL tablet Place 1 tablet (0.4 mg total) under the tongue every  5 (five) minutes as needed for chest pain. 25 tablet 3  . OXYGEN Inhale 4 L into the lungs continuous.    . pantoprazole (PROTONIX) 40 MG tablet Take 1 tablet (40 mg total) by mouth daily. 30 tablet 0  . sacubitril-valsartan (ENTRESTO) 24-26 MG Take 1 tablet by mouth 2 (two) times daily. 60 tablet 3  . tamsulosin (FLOMAX) 0.4 MG CAPS capsule Take 1 capsule (0.4 mg total) by mouth at bedtime. 30 capsule 0  . ticagrelor (BRILINTA) 90 MG TABS tablet Take 1 tablet (90 mg total) by mouth 2 (two) times daily. 180 tablet 1  . potassium chloride SA (K-DUR) 20 MEQ tablet Take 2  tablets (40 mEq total) by mouth daily for 30 days. 60 tablet 0  . spironolactone (ALDACTONE) 25 MG tablet Take 0.5 tablets (12.5 mg total) by mouth daily. 15 tablet 3   No current facility-administered medications for this visit.     Allergies:   Patient has no known allergies.   Social History:  The patient  reports that he quit smoking about 13 years ago. His smoking use included cigarettes. He smoked 2.00 packs per day. He has never used smokeless tobacco. He reports previous alcohol use. He reports that he does not use drugs.   Family History:  The patient's family history includes Colon cancer in his mother; Heart failure in his mother; Kidney cancer in his father and mother; Thyroid disease in his sister.    ROS:  Please see the history of present illness.   Otherwise, review of systems is positive for none.   All other systems are reviewed and negative.    PHYSICAL EXAM: VS:  BP 102/62   Pulse 80   Ht 6' (1.829 m)   Wt 264 lb (119.7 kg)   BMI 35.80 kg/m  , BMI Body mass index is 35.8 kg/m. GEN: Well nourished, well developed, in no acute distress  HEENT: normal  Neck: no JVD, carotid bruits, or masses Cardiac: RRR; no murmurs, rubs, or gallops,no edema  Respiratory:  clear to auscultation bilaterally, normal work of breathing GI: soft, nontender, nondistended, + BS MS: no deformity or atrophy  Skin: warm and dry Neuro:  Strength and sensation are intact Psych: euthymic mood, full affect  EKG:  EKG is ordered today. Personal review of the ekg ordered shows SR, RBBB   Recent Labs: 12/16/2018: B Natriuretic Peptide 916.8 12/17/2018: ALT 15; TSH 3.195 12/25/2018: Hemoglobin 13.2; Magnesium 2.2; Platelets 410 12/31/2018: BUN 14; Creatinine, Ser 1.15; NT-Pro BNP 2,784; Potassium 4.5; Sodium 136    Lipid Panel     Component Value Date/Time   CHOL 191 10/16/2018 0250   TRIG 49 10/16/2018 0250   HDL 62 10/16/2018 0250   CHOLHDL 3.1 10/16/2018 0250   VLDL 10 10/16/2018  0250   LDLCALC 119 (H) 10/16/2018 0250     Wt Readings from Last 3 Encounters:  02/09/19 264 lb (119.7 kg)  01/22/19 263 lb 12.8 oz (119.7 kg)  12/31/18 264 lb (119.7 kg)      Other studies Reviewed: Additional studies/ records that were reviewed today include: TTE 12/10/2018 Review of the above records today demonstrates:   1. The left ventricle has moderately reduced systolic function, with an ejection fraction of 35%. The cavity size was normal. There is moderate concentric left ventricular hypertrophy. Left ventricular diastolic Doppler parameters are indeterminate.  2. The right ventricle has normal systolic function. The cavity was normal. There is no increase in right ventricular wall thickness.  3. The mitral valve is degenerative. There is mild to moderate mitral annular calcification present. No evidence of mitral valve stenosis.  4. Mild thickening of the aortic valve. Mild focal calcification of the aortic valve. Aortic valve regurgitation is mild by color flow Doppler.  5. 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  6. Stage 1: 1: Multiple segmental abnormalities exist. See findings.  Left heart cath 10/16/2018  Prox RCA lesion is 20% stenosed.  Ost LAD to Prox LAD lesion is 100% stenosed.  Dist Cx lesion is 60% stenosed.  Post intervention, there is a 55% residual stenosis.  A stent was successfully placed.   ASSESSMENT AND PLAN:  1.  Chronic systolic heart failure due to ischemic cardiomyopathy: NYHA class II.  Currently on optimal medical therapy with Entresto, beta-blocker, Aldactone.  His ejection fraction is persistently been decreased and thus he would benefit from ICD.  Risks and benefits were discussed and include bleeding, tamponade, infection, pneumothorax.  The patient understands his risks and is agreed to the procedure.  2.  Coronary artery disease: Status post occlusion of the LAD with stenting of the LAD with a late  presenting MI.  No current chest pain.  3.  Hypertension: Blood pressure well controlled.    Current medicines are reviewed at length with the patient today.   The patient does not have concerns regarding his medicines.  The following changes were made today:  none  Labs/ tests ordered today include:  No orders of the defined types were placed in this encounter.  Case discussed with referring cardiologist  Disposition:   FU with Will Camnitz 3 months  Signed, Will Jorja LoaMartin Camnitz, MD  02/09/2019 3:03 PM     [facility] [address] [place] [address] [phone] (office) [phone] (fax)

## 2019-03-03 ENCOUNTER — Telehealth: Payer: Self-pay | Admitting: Cardiology

## 2019-03-03 DIAGNOSIS — Z01812 Encounter for preprocedural laboratory examination: Secondary | ICD-10-CM

## 2019-03-03 DIAGNOSIS — I255 Ischemic cardiomyopathy: Secondary | ICD-10-CM

## 2019-03-03 NOTE — Telephone Encounter (Signed)
Patient is asking about when is pacemaker going to be placed?

## 2019-03-04 NOTE — Telephone Encounter (Signed)
lmtcb

## 2019-03-05 NOTE — Telephone Encounter (Signed)
ICD implant scheduled for 9/16 Pt will get labs at his OV w/ Dr. Bettina Gavia on 9/10. COVID screening scheduled for 9/12 Letter of instructions sent via [place]. Pt aware office will contact him to arrange post procedure follow up. Patient verbalized understanding and agreeable to plan.

## 2019-03-05 NOTE — Addendum Note (Signed)
Addended by: Stanton Kidney on: 03/05/2019 01:54 PM   Modules accepted: Orders

## 2019-03-12 ENCOUNTER — Other Ambulatory Visit: Payer: Self-pay

## 2019-03-12 ENCOUNTER — Encounter: Payer: Self-pay | Admitting: Cardiology

## 2019-03-12 ENCOUNTER — Ambulatory Visit (INDEPENDENT_AMBULATORY_CARE_PROVIDER_SITE_OTHER): Payer: Medicare Other | Admitting: Cardiology

## 2019-03-12 VITALS — BP 112/66 | HR 74 | Temp 97.5°F | Ht 72.0 in | Wt 260.4 lb

## 2019-03-12 DIAGNOSIS — I110 Hypertensive heart disease with heart failure: Secondary | ICD-10-CM | POA: Diagnosis not present

## 2019-03-12 DIAGNOSIS — I255 Ischemic cardiomyopathy: Secondary | ICD-10-CM

## 2019-03-12 DIAGNOSIS — I2510 Atherosclerotic heart disease of native coronary artery without angina pectoris: Secondary | ICD-10-CM

## 2019-03-12 DIAGNOSIS — I5022 Chronic systolic (congestive) heart failure: Secondary | ICD-10-CM

## 2019-03-12 DIAGNOSIS — E782 Mixed hyperlipidemia: Secondary | ICD-10-CM

## 2019-03-12 DIAGNOSIS — J449 Chronic obstructive pulmonary disease, unspecified: Secondary | ICD-10-CM

## 2019-03-12 MED ORDER — ENTRESTO 49-51 MG PO TABS: ORAL_TABLET | ORAL | 2 refills | Status: DC

## 2019-03-12 NOTE — Progress Notes (Signed)
Cardiology Office Note:    Date:  03/12/2019   ID:  Justin Rout., DOB 1941-03-01, MRN [id number]  PCP:  Charleston Poot, MD  Cardiologist:  Shirlee More, MD    Referring MD: Charleston Poot, MD    ASSESSMENT:    1. Chronic systolic heart failure (HCC)   2. Cardiomyopathy, ischemic   3. CAD in native artery   4. Hypertensive heart disease with heart failure ([place])   5. Mixed hyperlipidemia   6. Chronic obstructive pulmonary disease, unspecified COPD type ([place])    PLAN:    In order of problems listed above:  1. Heart failure is improved New [place] Heart Association class II we will uptitrate Entresto and continue other guideline directed treatment with loop diuretic MRA beta-blocker labs today including potassium renal function proBNP.  Next visit will discuss SGLT2 inhibitor but will hold now because of financial limitations 2. Severe cardiomyopathy continue guideline directed therapy up titration his Entresto rather than beta-blocker with his COPD recheck proBNP encouraged him to proceed with ICD therapy at high risk of sudden death 3. Stable CAD continue treatment including dual antiplatelet and high intensity statin. 4. Stable hypertension 5. Continue his high intensity statin 6. Improved compensators continue bronchodilator and oxygen   Next appointment: 3 months   Medication Adjustments/Labs and Tests Ordered: Current medicines are reviewed at length with the patient today.  Concerns regarding medicines are outlined above.  No orders of the defined types were placed in this encounter.  No orders of the defined types were placed in this encounter.   Chief Complaint  Patient presents with  . Follow-up  . Congestive Heart Failure  . Coronary Artery Disease    History of Present Illness:    Justin Bradford. is a 78 y.o. male with a hx of CAD s/p NSTEMI 10/16/2018 with PCI/DES to LAD, chronic combined CHF (EF 35% 10/2018), HTN, HLD, DM type 2, COPD, and recent  [facility] admission June 2020 with pneumonia and decompensated heart failure last seen 01/22/2019 and transition to [place]..  With severe LV dysfunction remote from his myocardial infarction and heart failure he was evaluated by electrophysiology and is scheduled for ICD implantation. Compliance with diet, lifestyle and medications: Yes  Is quite meticulous takes medications restrict sodium weighs daily and his weight is continued to fall in the range of 3 to 5 pounds he has no edema strength and endurance are better he can do housework but he just finds himself fatigued and short of breath afterwards.  He has had no angina has not required nitroglycerin no edema orthopnea palpitation or syncope.  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 Past Medical History:  Diagnosis Date  . Acute systolic heart failure ([place])   . Arthritis   . COPD (chronic obstructive pulmonary disease) ([place])   . Heart attack ([place])   . Hypertension   . Peripheral neuropathy   . Pneumonia     Past Surgical History:  Procedure Laterality Date  . CARDIAC CATHETERIZATION    . CORONARY STENT INTERVENTION N/A 10/16/2018   Procedure: CORONARY STENT INTERVENTION;  Surgeon: Troy Sine, MD;  Location: [place] CV LAB;  Service: Cardiovascular;  Laterality: N/A;  . [place]  . LEFT HEART CATH AND CORONARY ANGIOGRAPHY N/A 10/16/2018   Procedure: LEFT HEART CATH AND CORONARY ANGIOGRAPHY;  Surgeon: Troy Sine, MD;  Location: [place]  CV LAB;  Service: Cardiovascular;  Laterality: N/A;  . TONSILLECTOMY      Current Medications: Current Meds  Medication Sig  . acetaminophen (TYLENOL) 325 MG tablet Take 2 tablets (650 mg total) by mouth every 4 (four) hours as needed for headache or mild pain.  . Albuterol Sulfate 108 (90 Base) MCG/ACT AEPB Inhale 1 puff into the lungs every 4 (four) hours as needed.  Marland Kitchen. aspirin EC 81 MG tablet Take 81 mg by  mouth daily.  Marland Kitchen. atorvastatin (LIPITOR) 80 MG tablet Take 1 tablet (80 mg total) by mouth daily at 6 PM.  . carvedilol (COREG) 3.125 MG tablet Take 1 tablet (3.125 mg total) by mouth 2 (two) times daily with a meal for 30 days.  . clonazePAM (KLONOPIN) 1 MG tablet Take 1 tablet (1 mg total) by mouth at bedtime.  Marland Kitchen. FLUoxetine (PROZAC) 20 MG capsule Take 3 capsules (60 mg total) by mouth daily for 30 days.  . fluticasone furoate-vilanterol (BREO ELLIPTA) 100-25 MCG/INH AEPB Inhale 1 puff into the lungs daily.  . furosemide (LASIX) 80 MG tablet Take 1 tablet (80 mg total) by mouth daily. Take additional 80 mg if weight is greater than 266lb  . lamoTRIgine (LAMICTAL) 25 MG tablet Take 3 tablets (75 mg total) by mouth at bedtime.  . metFORMIN (GLUCOPHAGE) 500 MG tablet Take 0.5 tablets (250 mg total) by mouth every morning.  . nitroGLYCERIN (NITROSTAT) 0.4 MG SL tablet Place 1 tablet (0.4 mg total) under the tongue every 5 (five) minutes as needed for chest pain.  . OXYGEN Inhale 4 L into the lungs continuous.  . pantoprazole (PROTONIX) 40 MG tablet Take 1 tablet (40 mg total) by mouth daily.  . potassium chloride SA (K-DUR) 20 MEQ tablet Take 2 tablets (40 mEq total) by mouth daily for 30 days.  . sacubitril-valsartan (ENTRESTO) 24-26 MG Take 1 tablet by mouth 2 (two) times daily.  Marland Kitchen. spironolactone (ALDACTONE) 25 MG tablet Take 0.5 tablets (12.5 mg total) by mouth daily.  . tamsulosin (FLOMAX) 0.4 MG CAPS capsule Take 1 capsule (0.4 mg total) by mouth at bedtime.  . ticagrelor (BRILINTA) 90 MG TABS tablet Take 1 tablet (90 mg total) by mouth 2 (two) times daily.     Allergies:   Patient has no known allergies.   Social History   Socioeconomic History  . Marital status: Widowed    Spouse name: Not on file  . Number of children: Not on file  . Years of education: Not on file  . Highest education level: Not on file  Occupational History  . Not on file  Social Needs  . Financial resource  strain: Not hard at all  . Food insecurity    Worry: Never true    Inability: Never true  . Transportation needs    Medical: No    Non-medical: No  Tobacco Use  . Smoking status: Former Smoker    Packs/day: 2.00    Types: Cigarettes    Quit date: 12/16/2005    Years since quitting: 13.2  . Smokeless tobacco: Never Used  . Tobacco comment: quit in 2007  Substance and Sexual Activity  . Alcohol use: Not Currently  . Drug use: No  . Sexual activity: Not on file  Lifestyle  . Physical activity    Days per week: Not on file    Minutes per session: Not on file  . Stress: Not at all  Relationships  . Social Musicianconnections    Talks on phone:  Not on file    Gets together: Not on file    Attends religious service: Not on file    Active member of club or organization: Not on file    Attends meetings of clubs or organizations: Not on file    Relationship status: Widowed  Other Topics Concern  . Not on file  Social History Narrative  . Not on file     Family History: The patient's family history includes Colon cancer in his mother; Heart failure in his mother; Kidney cancer in his father and mother; Thyroid disease in his sister. ROS:   Please see the history of present illness.    All other systems reviewed and are negative.  EKGs/Labs/Other Studies Reviewed:    The following studies were reviewed today:   Recent Labs: 12/16/2018: B Natriuretic Peptide 916.8 12/17/2018: ALT 15; TSH 3.195 12/25/2018: Hemoglobin 13.2; Magnesium 2.2; Platelets 410 12/31/2018: BUN 14; Creatinine, Ser 1.15; NT-Pro BNP 2,784; Potassium 4.5; Sodium 136  Recent Lipid Panel    Component Value Date/Time   CHOL 191 10/16/2018 0250   TRIG 49 10/16/2018 0250   HDL 62 10/16/2018 0250   CHOLHDL 3.1 10/16/2018 0250   VLDL 10 10/16/2018 0250   LDLCALC 119 (H) 10/16/2018 0250    Physical Exam:    VS:  BP 112/66 (BP Location: Right Arm, Patient Position: Sitting, Cuff Size: Large)   Pulse 74   Temp (!)  97.5 F (36.4 C)   Ht 6' (1.829 m)   Wt 260 lb 6.4 oz (118.1 kg)   SpO2 99%   BMI 35.32 kg/m     Wt Readings from Last 3 Encounters:  03/12/19 260 lb 6.4 oz (118.1 kg)  02/09/19 264 lb (119.7 kg)  01/22/19 263 lb 12.8 oz (119.7 kg)     GEN: He is still looks chronically ill and debilitated but improved well nourished, well developed in no acute distress HEENT: Normal NECK: No JVD; No carotid bruits LYMPHATICS: No lymphadenopathy CARDIAC: RRR, no murmurs, rubs, gallops RESPIRATORY:  Clear to auscultation without rales, wheezing or rhonchi  ABDOMEN: Soft, non-tender, non-distended MUSCULOSKELETAL:  No edema; No deformity  SKIN: Warm and dry NEUROLOGIC:  Alert and oriented x 3 PSYCHIATRIC:  Normal affect    Signed, Norman Herrlich, MD  03/12/2019 2:57 PM    [facility]

## 2019-03-12 NOTE — Patient Instructions (Addendum)
Medication Instructions:  Your physician has recommended you make the following change in your medication:   CHANGE ENTRESTO : Take 24/26 mg alternating with 49/51 mg x 2 weeks then Increase  Entresto to 49/51 1 tab twice daily thereafter.   If you need a refill on your cardiac medications before your next appointment, please call your pharmacy.   Lab work: Your physician recommends that you return for lab work in: TODAY CMP,Pro BMP  If you have labs (blood work) drawn today and your tests are completely normal, you will receive your results only by: Marland Kitchen MyChart Message (if you have MyChart) OR . A paper copy in the mail If you have any lab test that is abnormal or we need to change your treatment, we will call you to review the results.  Testing/Procedures: NOne  Follow-Up: At [facility], you and your health needs are our priority.  As part of our continuing mission to provide you with exceptional heart care, we have created designated Provider Care Teams.  These Care Teams include your primary Cardiologist (physician) and Advanced Practice Providers (APPs -  Physician Assistants and Nurse Practitioners) who all work together to provide you with the care you need, when you need it. You will need a follow up appointment in 3 months.  Please call our office 2 months in advance to schedule this appointment.  You may see Shirlee More, MD   Any Other Special Instructions Will Be Listed Below (If Applicable).

## 2019-03-12 NOTE — Addendum Note (Signed)
Addended by: Particia Nearing B on: 03/12/2019 03:15 PM   Modules accepted: Orders

## 2019-03-13 LAB — COMPREHENSIVE METABOLIC PANEL
ALT: 11 IU/L (ref 0–44)
AST: 15 IU/L (ref 0–40)
Albumin/Globulin Ratio: 2 (ref 1.2–2.2)
Albumin: 4.4 g/dL (ref 3.7–4.7)
Alkaline Phosphatase: 92 IU/L (ref 39–117)
BUN/Creatinine Ratio: 13 (ref 10–24)
BUN: 15 mg/dL (ref 8–27)
Bilirubin Total: 0.8 mg/dL (ref 0.0–1.2)
CO2: 26 mmol/L (ref 20–29)
Calcium: 9 mg/dL (ref 8.6–10.2)
Chloride: 99 mmol/L (ref 96–106)
Creatinine, Ser: 1.14 mg/dL (ref 0.76–1.27)
GFR calc Af Amer: 72 mL/min/{1.73_m2} (ref >59)
GFR calc non Af Amer: 62 mL/min/{1.73_m2} (ref >59)
Globulin, Total: 2.2 g/dL (ref 1.5–4.5)
Glucose: 106 mg/dL — ABNORMAL HIGH (ref 65–99)
Potassium: 4.1 mmol/L (ref 3.5–5.2)
Sodium: 138 mmol/L (ref 134–144)
Total Protein: 6.6 g/dL (ref 6.0–8.5)

## 2019-03-13 LAB — PRO B NATRIURETIC PEPTIDE: NT-Pro BNP: 1911 pg/mL — ABNORMAL HIGH (ref 0–486)

## 2019-03-14 ENCOUNTER — Other Ambulatory Visit (HOSPITAL_COMMUNITY)
Admission: RE | Admit: 2019-03-14 | Discharge: 2019-03-14 | Disposition: A | Discharge status: Home / Self Care | Payer: Medicare Other | Source: Ambulatory Visit | Attending: Cardiology | Admitting: Cardiology

## 2019-03-14 DIAGNOSIS — Z20828 Contact with and (suspected) exposure to other viral communicable diseases: Secondary | ICD-10-CM | POA: Insufficient documentation

## 2019-03-14 DIAGNOSIS — Z01812 Encounter for preprocedural laboratory examination: Secondary | ICD-10-CM | POA: Insufficient documentation

## 2019-03-15 LAB — NOVEL CORONAVIRUS, NAA (HOSP ORDER, SEND-OUT TO REF LAB; TAT 18-24 HRS): SARS-CoV-2, NAA: NOT DETECTED

## 2019-03-16 ENCOUNTER — Telehealth: Payer: Self-pay | Admitting: Cardiology

## 2019-03-16 NOTE — Telephone Encounter (Signed)
New message   Patient is wanting to talk to you about possible exposure to someone with covid in reference to his procedure. Please call to discuss.

## 2019-03-16 NOTE — Telephone Encounter (Signed)
Pt reports that his neighbor/friend that brought him to his COVID screening Saturday started having issues Saturday evening, not feeling well and 102 fever.  States that they were together Thursday and then on Saturday. Pt aware that I will discuss plan with hospital and call him back with recommendation. Pt agreeable to plan.

## 2019-03-16 NOTE — Telephone Encounter (Signed)
Advised pt that hospital advises we cancel procedure and reschedule based on pt's friend COVID result.  Pt will call me once she has gotten her result back and we will go from there to reschedule procedure. Patient verbalized understanding and agreeable to plan.

## 2019-03-17 ENCOUNTER — Telehealth: Payer: Self-pay | Admitting: Cardiology

## 2019-03-17 NOTE — Telephone Encounter (Signed)
Follow Up:   Pt says his friend test came back positive for COVID.

## 2019-03-17 NOTE — Telephone Encounter (Signed)
Patient's surgery was cancelled for tomorrow, he has been exposed to covid but has not tested yet. So far, no symptoms, but is waiting on more info.

## 2019-03-18 ENCOUNTER — Ambulatory Visit (HOSPITAL_COMMUNITY): Admission: RE | Admit: 2019-03-18 | Payer: Medicare Other | Source: Home / Self Care | Admitting: Cardiology

## 2019-03-18 ENCOUNTER — Encounter (HOSPITAL_COMMUNITY): Admission: RE | Payer: Medicare Other | Source: Home / Self Care

## 2019-03-18 SURGERY — ICD IMPLANT

## 2019-03-18 NOTE — Telephone Encounter (Signed)
Noted  

## 2019-03-18 NOTE — Telephone Encounter (Signed)
Pt aware to monitor for symptoms of COVID and to call his PCP if symptoms appear. (pt denies symptoms at this time) Pt aware I will follow up end of month,/beginning of next month to reschedule procedure. Patient verbalized understanding and agreeable to plan.

## 2019-03-24 ENCOUNTER — Other Ambulatory Visit: Payer: Self-pay | Admitting: Cardiology

## 2019-03-26 NOTE — Telephone Encounter (Signed)
New Message    Patient states his quarantine is up on 03/28/19 and would like to schedule his surgery now if he could.  Please call patient to advise.

## 2019-03-26 NOTE — Telephone Encounter (Signed)
Pt would like to reschedule ICD implant for 10/28. Aware that I will hold a spot that day and call him a later date to go over this. Patient verbalized understanding and agreeable to plan.

## 2019-03-31 ENCOUNTER — Ambulatory Visit: Payer: Medicare Other

## 2019-04-15 NOTE — Telephone Encounter (Signed)
Instructions for procedure reviewed with pt and sent via [place] aware I would call him next week to give instructions for stopping Brilinta and switching to Plavix. Aware office will call to arrange post procedure wound check and follow up. Patient verbalized understanding and agreeable to plan.

## 2019-04-21 ENCOUNTER — Other Ambulatory Visit: Payer: Self-pay | Admitting: Cardiology

## 2019-04-21 MED ORDER — FUROSEMIDE 80 MG PO TABS: 80.0000 mg | ORAL_TABLET | Freq: Every day | ORAL | 2 refills | Status: DC

## 2019-04-21 NOTE — Telephone Encounter (Signed)
°*  STAT* If patient is at the pharmacy, call can be transferred to refill team.   1. Which medications need to be refilled? (please list name of each medication and dose if known) Furosemide 80mg  tablet  2. Which pharmacy/location (including street and city if local pharmacy) is medication to be sent to?[id number]  3. Do they need a 30 day or 90 day supply? Justin Bradford

## 2019-04-21 NOTE — Telephone Encounter (Signed)
[facility] for [facility]semide [facility] to [facility] as requested.

## 2019-04-22 ENCOUNTER — Other Ambulatory Visit: Payer: Self-pay | Admitting: Family

## 2019-04-22 DIAGNOSIS — I110 Hypertensive heart disease with heart failure: Secondary | ICD-10-CM

## 2019-04-22 DIAGNOSIS — Z01812 Encounter for preprocedural laboratory examination: Secondary | ICD-10-CM

## 2019-04-22 NOTE — Progress Notes (Signed)
CBC and BMET per Trinidad Curet, RN for pre-procedure.   Loel Dubonnet, NP

## 2019-04-23 LAB — CBC
Hematocrit: 36.5 % — ABNORMAL LOW (ref 37.5–51.0)
Hemoglobin: 12.1 g/dL — ABNORMAL LOW (ref 13.0–17.7)
MCH: 30.2 pg (ref 26.6–33.0)
MCHC: 33.2 g/dL (ref 31.5–35.7)
MCV: 91 fL (ref 79–97)
Platelets: 307 10*3/uL (ref 150–450)
RBC: 4.01 x10E6/uL — ABNORMAL LOW (ref 4.14–5.80)
RDW: 13.1 % (ref 11.6–15.4)
WBC: 9.2 10*3/uL (ref 3.4–10.8)

## 2019-04-23 LAB — BASIC METABOLIC PANEL
BUN/Creatinine Ratio: 14 (ref 10–24)
BUN: 16 mg/dL (ref 8–27)
CO2: 25 mmol/L (ref 20–29)
Calcium: 9.1 mg/dL (ref 8.6–10.2)
Chloride: 98 mmol/L (ref 96–106)
Creatinine, Ser: 1.18 mg/dL (ref 0.76–1.27)
GFR calc Af Amer: 69 mL/min/{1.73_m2} (ref >59)
GFR calc non Af Amer: 60 mL/min/{1.73_m2} (ref >59)
Glucose: 104 mg/dL — ABNORMAL HIGH (ref 65–99)
Potassium: 4.4 mmol/L (ref 3.5–5.2)
Sodium: 135 mmol/L (ref 134–144)

## 2019-04-23 MED ORDER — CLOPIDOGREL BISULFATE 75 MG PO TABS: ORAL_TABLET | ORAL | 6 refills | Status: DC

## 2019-04-23 NOTE — Telephone Encounter (Signed)
Pt reports taking his morning dose of Brilinta. Advised pt to take no more of this medication, including tonight. Advised to start Plavix tomorrow.  Instructed to take 300 mg tomorrow and 75 mg a day afterwards. Patient verbalized understanding and agreeable to plan.

## 2019-04-25 ENCOUNTER — Other Ambulatory Visit (HOSPITAL_COMMUNITY)
Admission: RE | Admit: 2019-04-25 | Discharge: 2019-04-25 | Disposition: A | Discharge status: Home / Self Care | Payer: Medicare Other | Source: Ambulatory Visit | Attending: Cardiology | Admitting: Cardiology

## 2019-04-25 DIAGNOSIS — Z20828 Contact with and (suspected) exposure to other viral communicable diseases: Secondary | ICD-10-CM | POA: Diagnosis not present

## 2019-04-25 DIAGNOSIS — Z01812 Encounter for preprocedural laboratory examination: Secondary | ICD-10-CM | POA: Insufficient documentation

## 2019-04-26 LAB — NOVEL CORONAVIRUS, NAA (HOSP ORDER, SEND-OUT TO REF LAB; TAT 18-24 HRS): SARS-CoV-2, NAA: NOT DETECTED

## 2019-04-29 ENCOUNTER — Ambulatory Visit (HOSPITAL_COMMUNITY)
Admission: RE | Disposition: A | Discharge status: Home / Self Care | Payer: Self-pay | Source: Home / Self Care | Attending: Cardiology

## 2019-04-29 ENCOUNTER — Encounter (HOSPITAL_COMMUNITY): Payer: Self-pay | Admitting: Cardiology

## 2019-04-29 ENCOUNTER — Other Ambulatory Visit: Payer: Self-pay

## 2019-04-29 ENCOUNTER — Ambulatory Visit (HOSPITAL_COMMUNITY)
Admission: RE | Admit: 2019-04-29 | Discharge: 2019-04-30 | Disposition: A | Discharge status: Home / Self Care | Payer: Medicare Other | Attending: Cardiology | Admitting: Cardiology

## 2019-04-29 DIAGNOSIS — I428 Other cardiomyopathies: Secondary | ICD-10-CM | POA: Insufficient documentation

## 2019-04-29 DIAGNOSIS — I2510 Atherosclerotic heart disease of native coronary artery without angina pectoris: Secondary | ICD-10-CM | POA: Diagnosis not present

## 2019-04-29 DIAGNOSIS — Z87891 Personal history of nicotine dependence: Secondary | ICD-10-CM | POA: Diagnosis not present

## 2019-04-29 DIAGNOSIS — Z006 Encounter for examination for normal comparison and control in clinical research program: Secondary | ICD-10-CM | POA: Insufficient documentation

## 2019-04-29 DIAGNOSIS — I110 Hypertensive heart disease with heart failure: Secondary | ICD-10-CM | POA: Insufficient documentation

## 2019-04-29 DIAGNOSIS — G629 Polyneuropathy, unspecified: Secondary | ICD-10-CM | POA: Insufficient documentation

## 2019-04-29 DIAGNOSIS — M1990 Unspecified osteoarthritis, unspecified site: Secondary | ICD-10-CM | POA: Diagnosis not present

## 2019-04-29 DIAGNOSIS — Z955 Presence of coronary angioplasty implant and graft: Secondary | ICD-10-CM | POA: Diagnosis not present

## 2019-04-29 DIAGNOSIS — I252 Old myocardial infarction: Secondary | ICD-10-CM | POA: Diagnosis not present

## 2019-04-29 DIAGNOSIS — Z8249 Family history of ischemic heart disease and other diseases of the circulatory system: Secondary | ICD-10-CM | POA: Insufficient documentation

## 2019-04-29 DIAGNOSIS — J449 Chronic obstructive pulmonary disease, unspecified: Secondary | ICD-10-CM | POA: Insufficient documentation

## 2019-04-29 DIAGNOSIS — I5022 Chronic systolic (congestive) heart failure: Secondary | ICD-10-CM | POA: Diagnosis present

## 2019-04-29 DIAGNOSIS — Z95818 Presence of other cardiac implants and grafts: Secondary | ICD-10-CM

## 2019-04-29 DIAGNOSIS — I255 Ischemic cardiomyopathy: Secondary | ICD-10-CM | POA: Diagnosis not present

## 2019-04-29 HISTORY — PX: ICD IMPLANT: EP1208

## 2019-04-29 LAB — GLUCOSE, CAPILLARY: Glucose-Capillary: 102 mg/dL — ABNORMAL HIGH (ref 70–99)

## 2019-04-29 LAB — SURGICAL PCR SCREEN
MRSA, PCR: NEGATIVE
Staphylococcus aureus: POSITIVE — AB

## 2019-04-29 SURGERY — ICD IMPLANT

## 2019-04-29 MED ORDER — SACUBITRIL-VALSARTAN 49-51 MG PO TABS
1.0000 | ORAL_TABLET | Freq: Two times a day (BID) | ORAL | Status: DC
  Administered 2019-04-29 – 2019-04-30 (×2): 1 via ORAL
  Filled 2019-04-29 (×3): qty 1

## 2019-04-29 MED ORDER — FLUTICASONE FUROATE-VILANTEROL 200-25 MCG/INH IN AEPB
1.0000 | INHALATION_SPRAY | Freq: Every day | RESPIRATORY_TRACT | Status: DC
  Filled 2019-04-29: qty 28

## 2019-04-29 MED ORDER — SODIUM CHLORIDE 0.9 % IV SOLN
INTRAVENOUS | Status: DC
  Administered 2019-04-29: 09:00:00 via INTRAVENOUS

## 2019-04-29 MED ORDER — MUPIROCIN 2 % EX OINT
TOPICAL_OINTMENT | CUTANEOUS | Status: AC
  Administered 2019-04-29: 09:00:00
  Filled 2019-04-29: qty 22

## 2019-04-29 MED ORDER — CHLORHEXIDINE GLUCONATE 4 % EX LIQD: 4.0000 "application " | Freq: Once | CUTANEOUS | Status: DC

## 2019-04-29 MED ORDER — ONDANSETRON HCL 4 MG/2ML IJ SOLN: 4.0000 mg | Freq: Four times a day (QID) | INTRAMUSCULAR | Status: DC | PRN

## 2019-04-29 MED ORDER — FENTANYL CITRATE (PF) 100 MCG/2ML IJ SOLN
INTRAMUSCULAR | Status: DC | PRN
  Administered 2019-04-29 (×3): 25 ug via INTRAVENOUS

## 2019-04-29 MED ORDER — FENTANYL CITRATE (PF) 100 MCG/2ML IJ SOLN
INTRAMUSCULAR | Status: AC
  Filled 2019-04-29: qty 2

## 2019-04-29 MED ORDER — LIDOCAINE HCL 1 % IJ SOLN
INTRAMUSCULAR | Status: AC
  Filled 2019-04-29: qty 20

## 2019-04-29 MED ORDER — SPIRONOLACTONE 12.5 MG HALF TABLET
12.5000 mg | ORAL_TABLET | Freq: Every day | ORAL | Status: DC
  Administered 2019-04-29 – 2019-04-30 (×2): 12.5 mg via ORAL
  Filled 2019-04-29 (×2): qty 1

## 2019-04-29 MED ORDER — MIDAZOLAM HCL 5 MG/5ML IJ SOLN
INTRAMUSCULAR | Status: AC
  Filled 2019-04-29: qty 5

## 2019-04-29 MED ORDER — FLUOXETINE HCL 20 MG PO CAPS
60.0000 mg | ORAL_CAPSULE | Freq: Every day | ORAL | Status: DC
  Administered 2019-04-29 – 2019-04-30 (×2): 60 mg via ORAL
  Filled 2019-04-29 (×2): qty 3

## 2019-04-29 MED ORDER — FUROSEMIDE 80 MG PO TABS
80.0000 mg | ORAL_TABLET | Freq: Every day | ORAL | Status: DC
  Administered 2019-04-29 – 2019-04-30 (×2): 80 mg via ORAL
  Filled 2019-04-29 (×2): qty 1

## 2019-04-29 MED ORDER — PANTOPRAZOLE SODIUM 40 MG PO TBEC
40.0000 mg | DELAYED_RELEASE_TABLET | Freq: Every day | ORAL | Status: DC
  Administered 2019-04-29 – 2019-04-30 (×2): 40 mg via ORAL
  Filled 2019-04-29 (×2): qty 1

## 2019-04-29 MED ORDER — SODIUM CHLORIDE 0.9 % IV SOLN: 80.0000 mg | INTRAVENOUS | Status: DC

## 2019-04-29 MED ORDER — HEPARIN (PORCINE) IN NACL 1000-0.9 UT/500ML-% IV SOLN
INTRAVENOUS | Status: AC
  Filled 2019-04-29: qty 500

## 2019-04-29 MED ORDER — LAMOTRIGINE 25 MG PO TABS
75.0000 mg | ORAL_TABLET | Freq: Every day | ORAL | Status: DC
  Administered 2019-04-29: 75 mg via ORAL
  Filled 2019-04-29 (×2): qty 3

## 2019-04-29 MED ORDER — HEPARIN (PORCINE) IN NACL 1000-0.9 UT/500ML-% IV SOLN
INTRAVENOUS | Status: DC | PRN
  Administered 2019-04-29: 500 mL

## 2019-04-29 MED ORDER — ASPIRIN EC 81 MG PO TBEC
81.0000 mg | DELAYED_RELEASE_TABLET | Freq: Every day | ORAL | Status: DC
  Administered 2019-04-29 – 2019-04-30 (×2): 81 mg via ORAL
  Filled 2019-04-29 (×2): qty 1

## 2019-04-29 MED ORDER — METFORMIN HCL 500 MG PO TABS
500.0000 mg | ORAL_TABLET | Freq: Every day | ORAL | Status: DC
  Administered 2019-04-30: 500 mg via ORAL
  Filled 2019-04-29: qty 1

## 2019-04-29 MED ORDER — CLOPIDOGREL BISULFATE 75 MG PO TABS
75.0000 mg | ORAL_TABLET | Freq: Every day | ORAL | Status: DC
  Administered 2019-04-29 – 2019-04-30 (×2): 75 mg via ORAL
  Filled 2019-04-29 (×2): qty 1

## 2019-04-29 MED ORDER — POTASSIUM CHLORIDE CRYS ER 20 MEQ PO TBCR
20.0000 meq | EXTENDED_RELEASE_TABLET | Freq: Two times a day (BID) | ORAL | Status: DC
  Administered 2019-04-29 – 2019-04-30 (×2): 20 meq via ORAL
  Filled 2019-04-29 (×2): qty 1

## 2019-04-29 MED ORDER — CEFAZOLIN SODIUM-DEXTROSE 2-4 GM/100ML-% IV SOLN
2.0000 g | INTRAVENOUS | Status: AC
  Administered 2019-04-29: 2 g via INTRAVENOUS

## 2019-04-29 MED ORDER — MIDAZOLAM HCL 5 MG/5ML IJ SOLN
INTRAMUSCULAR | Status: DC | PRN
  Administered 2019-04-29 (×3): 1 mg via INTRAVENOUS

## 2019-04-29 MED ORDER — ATORVASTATIN CALCIUM 80 MG PO TABS
80.0000 mg | ORAL_TABLET | Freq: Every day | ORAL | Status: DC
  Administered 2019-04-29 – 2019-04-30 (×2): 80 mg via ORAL
  Filled 2019-04-29 (×2): qty 1

## 2019-04-29 MED ORDER — CLONAZEPAM 0.5 MG PO TABS
1.0000 mg | ORAL_TABLET | Freq: Every day | ORAL | Status: DC
  Administered 2019-04-29: 1 mg via ORAL
  Filled 2019-04-29: qty 2

## 2019-04-29 MED ORDER — LIDOCAINE HCL (PF) 1 % IJ SOLN
INTRAMUSCULAR | Status: DC | PRN
  Administered 2019-04-29: 60 mL

## 2019-04-29 MED ORDER — SODIUM CHLORIDE 0.9 % IV SOLN
INTRAVENOUS | Status: AC
  Filled 2019-04-29: qty 2

## 2019-04-29 MED ORDER — TAMSULOSIN HCL 0.4 MG PO CAPS
0.4000 mg | ORAL_CAPSULE | Freq: Every day | ORAL | Status: DC
  Administered 2019-04-29: 0.4 mg via ORAL
  Filled 2019-04-29: qty 1

## 2019-04-29 MED ORDER — NITROGLYCERIN 0.4 MG SL SUBL: 0.4000 mg | SUBLINGUAL_TABLET | SUBLINGUAL | Status: DC | PRN

## 2019-04-29 MED ORDER — CEFAZOLIN SODIUM-DEXTROSE 1-4 GM/50ML-% IV SOLN
1.0000 g | Freq: Four times a day (QID) | INTRAVENOUS | Status: AC
  Administered 2019-04-29 – 2019-04-30 (×3): 1 g via INTRAVENOUS
  Filled 2019-04-29 (×4): qty 50

## 2019-04-29 MED ORDER — CARVEDILOL 3.125 MG PO TABS
3.1250 mg | ORAL_TABLET | Freq: Two times a day (BID) | ORAL | Status: DC
  Administered 2019-04-30 (×2): 3.125 mg via ORAL
  Filled 2019-04-29 (×3): qty 1

## 2019-04-29 MED ORDER — ACETAMINOPHEN 325 MG PO TABS
650.0000 mg | ORAL_TABLET | ORAL | Status: DC | PRN
  Administered 2019-04-30: 650 mg via ORAL
  Filled 2019-04-29: qty 2

## 2019-04-29 MED ORDER — FLUTICASONE FUROATE-VILANTEROL 200-25 MCG/INH IN AEPB
1.0000 | INHALATION_SPRAY | Freq: Every day | RESPIRATORY_TRACT | Status: DC
  Filled 2019-04-29 (×2): qty 28

## 2019-04-29 MED ORDER — CEFAZOLIN SODIUM-DEXTROSE 2-4 GM/100ML-% IV SOLN
INTRAVENOUS | Status: AC
  Filled 2019-04-29: qty 100

## 2019-04-29 MED ORDER — ALBUTEROL SULFATE (2.5 MG/3ML) 0.083% IN NEBU: 3.0000 mL | INHALATION_SOLUTION | RESPIRATORY_TRACT | Status: DC | PRN

## 2019-04-29 SURGICAL SUPPLY — 6 items
CABLE SURGICAL S-101-97-12 (CABLE) ×3 IMPLANT
ICD GALLANT VR CDVRA500Q (ICD Generator) ×3 IMPLANT
LEAD DURATA 7122Q-65CM (Lead) ×3 IMPLANT
PAD PRO RADIOLUCENT 2001M-C (PAD) ×3 IMPLANT
SHEATH 7FR PRELUDE SNAP 13 (SHEATH) ×3 IMPLANT
TRAY PACEMAKER INSERTION (PACKS) ×3 IMPLANT

## 2019-04-29 NOTE — Discharge Instructions (Signed)
After Your ICD (Implantable Cardiac Defibrillator)    You have a St. Jude ICD   Do not lift your arm above shoulder height for 1 week after your procedure. After 7 days, you may progress as below.     Wednesday May 06, 2019  Thursday May 07, 2019 Friday May 08, 2019 Saturday May 09, 2019    Do not lift, push, pull, or carry anything over 10 pounds with the affected arm until 6 weeks (Wednesday June 10, 2019 ) after your procedure.    Do not drive until you have been seen for your wound check, or as long as instructed by your healthcare provider.    Monitor your defibrillator site for redness, swelling, and drainage. Call the device clinic at [phone] if you experience these symptoms or fever/chills.   If your incision is sealed with Steri-strips or staples, you may shower 7 days after your procedure. Do not remove the steri-strips or let the shower hit directly on your site. You may wash around your site with soap and water. If your incision is closed with Dermabond/Surgical glue. You may shower 1 day after your pacemaker implant and wash around the site with soap and water. Avoid lotions, ointments, or perfumes over your incision until it is well-healed.   You may use a hot tub or a pool AFTER your wound check appointment if the incision is completely closed.   Your ICD  may be MRI compatible. This will be discussed at your next office visit/wound check.    Your ICD is designed to protect you from life threatening heart rhythms. Because of this, you may receive a shock.   o 1 shock with no symptoms:  Call the office during business hours. o 1 shock with symptoms (chest pain, chest pressure, dizziness, lightheadedness, shortness of breath, overall feeling unwell):  Call 911. o If you experience 2 or more shocks in 24 hours:  Call 911. o If you receive a shock, you should not drive for 6 months per the [place] DMV IF you receive appropriate therapy from your ICD.     ICD Alerts:  Some alerts are vibratory and others beep. These are NOT emergencies. Please call our office to let us know. If this occurs at night or on weekends, it can wait until the next business day. Send a remote transmission.   If your device is capable of reading fluid status (for heart failure), you will be offered monthly monitoring to review this with you.    Remote monitoring is used to monitor your ICD from home. This monitoring is scheduled every 91 days by our office. It allows [place] to keep an eye on the functioning of your device to ensure it is working properly. You will routinely see your Electrophysiologist annually (more often if necessary).    Cardioverter Defibrillator Implantation, Care After This sheet gives you information about how to care for yourself after your procedure. Your health care provider may also give you more specific instructions. If you have problems or questions, contact your health care provider. What can I expect after the procedure? After the procedure, it is common to have:  Some pain. It may last a few days.  A slight bump over the skin where the device was placed. Sometimes, it is possible to feel the device under the skin. This is normal.  During the months and years after your procedure, your health care provider will check the device, the leads, and the battery every few months.  Eventually, when the battery is low, the device will be replaced.  You should receive your defibrillator ID card for your new device in the next 4-8 weeks.  Follow these instructions at home: Medicines  Take over-the-counter and prescription medicines only as told by your health care provider.  If you were prescribed an antibiotic medicine, take it as told by your health care provider. Do not stop taking the antibiotic even if you start to feel better. Incision care        Follow instructions from your health care provider about how to take care of your  incision area. Make sure you: ? Leave stitches (sutures), skin glue, or adhesive strips in place. These skin closures may need to stay in place for 2 weeks or longer. If adhesive strip edges start to loosen and curl up, you may trim the loose edges. Do not remove adhesive strips completely unless your health care provider tells you to do that.  Check your incision area every day for signs of infection. Check for: ? More redness, swelling, or pain. ? More fluid or blood. ? Warmth. ? Pus or a bad smell.  Do not use lotions or ointments near the incision area unless told by your health care provider.  Keep the incision area clean and dry for 7 days after the procedure or for as long as told by your health care provider. It takes several weeks for the incision site to heal completely.  Do not take baths, swim, or use a hot tub until your health care provider approves. Activity  Try to walk a little every day. Exercising is important after this procedure. Also, use your shoulder on the side of the defibrillator in daily tasks that do not require a lot of motion.  For at least 1 week: ? Do not lift your upper arm above your shoulders. This means no tennis, golf, or swimming for this period of time. If you tend to sleep with your arm above your head, use a restraint to prevent this during sleep.  For at least 6 weeks: ? Avoid sudden jerking, pulling, or chopping movements that pull your upper arm far away from your body.  Ask your health care provider when you may go back to work.  Check with your health care provider before you start to drive or play sports. Electric and magnetic fields  Tell all health care providers that you have a defibrillator. This may prevent them from giving you an MRI scan because strong magnets are used for that test.  If you must pass through a metal detector, quickly walk through it. Do not stop under the detector, and do not stand near it.  Avoid places or  objects that have a strong electric or magnetic field, including: ? Airport Herbalist. At the airport, let officials know that you have a defibrillator. Your defibrillator ID card will let you be checked in a way that is safe for you and will not damage your defibrillator. Also, do not let a security person wave a magnetic wand near your defibrillator. That can make it stop working. ? Power plants. ? Large electrical generators. ? Anti-theft systems or electronic article surveillance (EAS). ? Radiofrequency transmission towers, such as cell phone and radio towers.  Do not use amateur (ham) radio equipment or electric (arc) welding torches. Some devices are safe to use if held at least 12 inches (30 cm) from your defibrillator. These include power tools, lawn mowers, and speakers. If you  are unsure if something is safe to use, ask your health care provider.  Do not use MP3 player headphones. They have magnets.  You may safely use electric blankets, heating pads, computers, and microwave ovens.  When using your cell phone, hold it to the ear that is on the opposite side from the defibrillator. Do not leave your cell phone in a pocket over the defibrillator. General instructions  Follow diet instructions from your health care provider, if this applies.  Always keep your defibrillator ID card with you. The card should list the implant date, device model, and manufacturer. Consider wearing a medical alert bracelet or necklace.  Have your defibrillator checked every 3-6 months or as often as told by your health care provider. Most defibrillators last for 4-8 years.  Keep all follow-up visits as told by your health care provider. This is important for your health care provider to make sure your chest is healing the way it should. Ask your health care provider when you should come back to have your stitches or staples taken out. Contact a health care provider if:  You gain weight  suddenly.  Your legs or feet swell more than they have before.  It feels like your heart is fluttering or skipping beats (heart palpitations).  You have more redness, swelling, or pain around your incision.  You have more fluid or blood coming from your incision.  Your incision feels warm to the touch.  You have pus or a bad smell coming from your incision.  You have a fever. Get help right away if:  You have chest pain.  You feel more than one shock.  You feel more short of breath than you have felt before.  You feel more light-headed than you have felt before.  Your incision starts to open up. This information is not intended to replace advice given to you by your health care provider. Make sure you discuss any questions you have with your health care provider.

## 2019-04-29 NOTE — Discharge Summary (Addendum)
ELECTROPHYSIOLOGY PROCEDURE DISCHARGE SUMMARY    Patient ID: Justin Bradford.,  MRN: [id number], DOB/AGE: 1941/06/11 78 y.o.  Admit date: 04/29/2019 Discharge date: 04/30/2019  Primary Care Physician: Forrest Moron, MD  Primary Cardiologist: Norman Herrlich, MD  Electrophysiologist: None   Primary Diagnosis:  Chronic systolic CHF  NICM  Secondary Diagnosis: CAD  No Known Allergies   Procedures This Admission:  1.  Implantation of a St. Jude single chamber ICD on 04/29/2019 by Dr. Elberta Fortis.  The patient received a St Jude model number [id number] ICD with model number [id number] right ventricular lead.  DFT's were deferred at time of implant.  There were no immediate post procedure complications. 2.  CXR on 04/30/19  demonstrated no pneumothorax status post device implantation.  Brief HPI: Justin Bradford. is a 78 y.o. male was referred to electrophysiology in the outpatient setting for consideration of ICD implantation.  Past medical history includes above.  The patient has persistent LV dysfunction despite guideline directed therapy.  Risks, benefits, and alternatives to ICD implantation were reviewed with the patient who wished to proceed.   Hospital Course:  The patient was admitted and underwent implantation of a St. Jude single chamber ICD with details as outlined above. They were monitored on telemetry overnight which demonstrated NSR.  Left chest was without hematoma or ecchymosis.  The device was interrogated and found to be functioning normally.  CXR was obtained and demonstrated no pneumothorax status post device implantation..  Wound care, arm mobility, and restrictions were reviewed with the patient.  The patient was examined and considered stable for discharge to home.   The patient's discharge medications include an ACE-I/ARB/ARNI Sherryll Burger) and beta blocker (coreg).    Physical Exam: Vitals:   04/29/19 1750 04/29/19 2149 04/30/19 0112 04/30/19 0616  BP:  92/63 94/62 (!) 111/59 95/66  Pulse: 71 73 68 66  Resp:      Temp:  98.2 F (36.8 C)  97.8 F (36.6 C)  TempSrc:  Oral  Oral  SpO2: 100% 99% 98% 100%  Weight:    115.4 kg  Height:        GEN- The patient is well appearing, alert and oriented x 3 today.   HEENT: normocephalic, atraumatic; sclera clear, conjunctiva pink; hearing intact; oropharynx clear; neck supple, no JVP Lymph- no cervical lymphadenopathy Lungs- Clear to ausculation bilaterally, normal work of breathing.  No wheezes, rales, rhonchi Heart- Regular rate and rhythm, no murmurs, rubs or gallops, PMI not laterally displaced GI- soft, non-tender, non-distended, bowel sounds present, no hepatosplenomegaly Extremities- no clubbing, cyanosis, or edema; DP/PT/radial pulses 2+ bilaterally MS- no significant deformity or atrophy Skin- warm and dry, no rash or lesion. ICD site stable. Psych- euthymic mood, full affect Neuro- strength and sensation are intact   Labs:   Lab Results  Component Value Date   WBC 9.2 04/22/2019   HGB 12.1 (L) 04/22/2019   HCT 36.5 (L) 04/22/2019   MCV 91 04/22/2019   PLT 307 04/22/2019   No results for input(s): NA, K, CL, CO2, BUN, CREATININE, CALCIUM, PROT, BILITOT, ALKPHOS, ALT, AST, GLUCOSE in the last 168 hours.  Invalid input(s): LABALBU  Discharge Medications:  Allergies as of 04/30/2019   No Known Allergies     Medication List    TAKE these medications   acetaminophen 325 MG tablet Commonly known as: TYLENOL Take 2 tablets (650 mg total) by mouth every 4 (four) hours as needed for headache or mild pain.   Albuterol  Sulfate 108 (90 Base) MCG/ACT Aepb Inhale 1 puff into the lungs every 4 (four) hours as needed (Shortness of breath).   aspirin EC 81 MG tablet Take 81 mg by mouth daily.   atorvastatin 80 MG tablet Commonly known as: LIPITOR Take 1 tablet (80 mg total) by mouth daily at 6 PM.   Breo Ellipta 200-25 MCG/INH Aepb Generic drug: fluticasone furoate-vilanterol  Inhale 1 puff into the lungs daily.   carvedilol 3.125 MG tablet Commonly known as: COREG Take 1 tablet (3.125 mg total) by mouth 2 (two) times daily with a meal for 30 days.   clonazePAM 1 MG tablet Commonly known as: KLONOPIN Take 1 tablet (1 mg total) by mouth at bedtime.   clopidogrel 75 MG tablet Commonly known as: PLAVIX Take 4 tablets (300 mg total) the first day.  Then take 1 tablet (75 mg total) once a day. What changed:   how much to take  how to take this  when to take this   Entresto 49-51 MG Generic drug: sacubitril-valsartan Take 1 tab every other day alternating with the 24/26 mg tab every other day x 2 weeks then increase to 49/5/ mg twice daily therafter What changed:   how much to take  how to take this  when to take this  additional instructions  Another medication with the same name was removed. Continue taking this medication, and follow the directions you see here.   FLUoxetine 20 MG capsule Commonly known as: PROZAC Take 3 capsules (60 mg total) by mouth daily for 30 days.   furosemide 80 MG tablet Commonly known as: LASIX Take 1 tablet (80 mg total) by mouth daily. Take additional 80 mg if weight is greater than 266lb   lamoTRIgine 25 MG tablet Commonly known as: LAMICTAL Take 3 tablets (75 mg total) by mouth at bedtime.   metFORMIN 500 MG tablet Commonly known as: GLUCOPHAGE Take 0.5 tablets (250 mg total) by mouth every morning. What changed:   how much to take  when to take this   nitroGLYCERIN 0.4 MG SL tablet Commonly known as: NITROSTAT Place 1 tablet (0.4 mg total) under the tongue every 5 (five) minutes as needed for chest pain.   OXYGEN Inhale 5 L into the lungs continuous.   pantoprazole 40 MG tablet Commonly known as: PROTONIX Take 1 tablet (40 mg total) by mouth daily.   potassium chloride SA 20 MEQ tablet Commonly known as: KLOR-CON Take 2 tablets (40 mEq total) by mouth daily for 30 days. What changed:    how much to take  when to take this   spironolactone 25 MG tablet Commonly known as: ALDACTONE TAKE 1/2 TABLET BY MOUTH EVERY DAY   tamsulosin 0.4 MG Caps capsule Commonly known as: FLOMAX Take 1 capsule (0.4 mg total) by mouth at bedtime.       Disposition:   Follow-up Information    Constance Haw, MD Follow up on 08/04/2019.   Specialty: Cardiology Why: at 3 pm for 3 month post ICD check Contact information: [address] [place] [address] [phone]        [facility] Follow up on 05/12/2019.   Why: at 0900 for post hospital ICD check Contact information: [place] [id number] [phone]          Duration of Discharge Encounter: Greater than 30 minutes including physician time.  Jacalyn Lefevre, PA-C  04/30/2019 9:00  AM   I have seen and examined this patient with Otilio Saber.  Agree with above, note added to reflect my findings.  On exam, RRR, no murmurs, lungs clear.  She is now status post Saint Jude ICD for chronic systolic heart failure.  Device functioning appropriately.  Chest x-ray and interrogation without issue.  Plan for discharge today with follow-up in device clinic.  Will M. Camnitz MD 04/30/2019 10:39 AM

## 2019-04-29 NOTE — H&P (Signed)
ICD Criteria  Current LVEF:35%. Within 12 months prior to implant: Yes   Heart failure history: Yes, Class II  Cardiomyopathy history: Yes, Ischemic Cardiomyopathy - Prior MI.  Atrial Fibrillation/Atrial Flutter: No.  Ventricular tachycardia history: No.  Cardiac arrest history: No.  History of syndromes with risk of sudden death: No.  Previous ICD: No.  Current ICD indication: Primary  PPM indication: No.  Class I or II Bradycardia indication present: No  Beta Blocker therapy for 3 or more months: Yes, prescribed.   Ace Inhibitor/ARB therapy for 3 or more months: Yes, prescribed.    I have seen Justin Bradford. is a 78 y.o. malepre-procedural and has been referred by [facility] for consideration of ICD implant for primary prevention of sudden death.  The patient's chart has been reviewed and they meet criteria for ICD implant.  I have had a thorough discussion with the patient reviewing options.  The patient and their family (if available) have had opportunities to ask questions and have them answered. The patient and I have decided together through the [place] [employer] to implant ICD at this time.  Risks, benefits, alternatives to ICD implantation were discussed in detail with the patient today. The patient  understands that the risks include but are not limited to bleeding, infection, pneumothorax, perforation, tamponade, vascular damage, renal failure, MI, stroke, death, inappropriate shocks, and lead dislodgement and  wishes to proceed.       Electrophysiology Office Note   Date:  04/29/2019   ID:  Justin Maceachern., DOB 1940/10/02, MRN [id number]  PCP:  Charleston Poot, MD  Cardiologist: Bettina Gavia Primary Electrophysiologist:  Magaby Rumberger Meredith Leeds, MD    No chief complaint on file.    History of Present Illness: Justin Bradford. is a 78 y.o. male who is being seen today for the evaluation of ischemic cardiomyopathy at the request of Shirlee More. Presenting today for electrophysiology evaluation.  He has a history significant for ischemic cardiomyopathy, hypertension, coronary artery disease.  He had a late presentation of an anterior MI with a 100% occluded LAD in August 2020.  During that time, he developed atrial fibrillation with rapid rates.  He was initially put on amiodarone, but has had no further recurrences.  Repeat echocardiogram shows an ejection fraction of 35%.  Today, he denies symptoms of palpitations, chest pain, shortness of breath, orthopnea, PND, lower extremity edema, claudication, dizziness, presyncope, syncope, bleeding, or neurologic sequela. The patient is tolerating medications without difficulties.    Past Medical History:  Diagnosis Date  . Acute systolic heart failure ([place])   . Arthritis   . COPD (chronic obstructive pulmonary disease) ([place])   . Heart attack ([place])   . Hypertension   . Peripheral neuropathy   . Pneumonia    Past Surgical History:  Procedure Laterality Date  . CARDIAC CATHETERIZATION    . CORONARY STENT INTERVENTION N/A 10/16/2018   Procedure: CORONARY STENT INTERVENTION;  Surgeon: Troy Sine, MD;  Location: [facility];  Service: Cardiovascular;  Laterality: N/A;  . [place]  . LEFT HEART CATH AND CORONARY ANGIOGRAPHY N/A 10/16/2018   Procedure: LEFT HEART CATH AND CORONARY ANGIOGRAPHY;  Surgeon: Troy Sine, MD;  Location: [facility];  Service: Cardiovascular;  Laterality: N/A;  . TONSILLECTOMY       Current Facility-Administered Medications  Medication Dose Route Frequency Provider Last Rate Last Dose  . 0.9 %  sodium  chloride infusion   Intravenous Continuous Regan Lemming, MD 50 mL/hr at 04/29/19 [phone]    . ceFAZolin (ANCEF) IVPB 2g/100 mL premix  2 g Intravenous On Call Jaksen Fiorella Daphine Deutscher, MD      . chlorhexidine (HIBICLENS) 4 % liquid 4 application  4 application Topical Once Princessa Lesmeister Daphine Deutscher, MD      . gentamicin  (GARAMYCIN) 80 mg in sodium chloride 0.9 % 500 mL irrigation  80 mg Irrigation On Call Regan Lemming, MD        Allergies:   Patient has no known allergies.   Social History:  The patient  reports that he quit smoking about 13 years ago. His smoking use included cigarettes. He smoked 2.00 packs per day. He has never used smokeless tobacco. He reports previous alcohol use. He reports that he does not use drugs.   Family History:  The patient's family history includes Colon cancer in his mother; Heart failure in his mother; Kidney cancer in his father and mother; Thyroid disease in his sister.    GEN: Well nourished, well developed, in no acute distress  HEENT: normal  Neck: no JVD, carotid bruits, or masses Cardiac: RRR; no murmurs, rubs, or gallops,no edema  Respiratory:  clear to auscultation bilaterally, normal work of breathing GI: soft, nontender, nondistended, + BS MS: no deformity or atrophy  Skin: warm and dry Neuro:  Strength and sensation are intact Psych: euthymic mood, full affect Today's Vitals   04/29/19 0829 04/29/19 0912  BP: 115/68   Pulse: 68   Resp: 18   Temp: 97.8 F (36.6 C)   TempSrc: Oral   SpO2: 100%   Weight: 115.7 kg   Height: 6' (1.829 m)   PainSc:  0-No pain   Body mass index is 34.58 kg/m.    Recent Labs: 12/16/2018: B Natriuretic Peptide 916.8 12/17/2018: TSH 3.195 12/25/2018: Magnesium 2.2 03/12/2019: ALT 11; NT-Pro BNP 1,911 04/22/2019: BUN 16; Creatinine, Ser 1.18; Hemoglobin 12.1; Platelets 307; Potassium 4.4; Sodium 135    Lipid Panel     Component Value Date/Time   CHOL 191 10/16/2018 0250   TRIG 49 10/16/2018 0250   HDL 62 10/16/2018 0250   CHOLHDL 3.1 10/16/2018 0250   VLDL 10 10/16/2018 0250   LDLCALC 119 (H) 10/16/2018 0250     Wt Readings from Last 3 Encounters:  04/29/19 115.7 kg  03/12/19 118.1 kg  02/09/19 119.7 kg      Other studies Reviewed: Additional studies/ records that were reviewed today include:  TTE 12/10/2018 Review of the above records today demonstrates:   1. The left ventricle has moderately reduced systolic function, with an ejection fraction of 35%. The cavity size was normal. There is moderate concentric left ventricular hypertrophy. Left ventricular diastolic Doppler parameters are indeterminate.  2. The right ventricle has normal systolic function. The cavity was normal. There is no increase in right ventricular wall thickness.  3. The mitral valve is degenerative. There is mild to moderate mitral annular calcification present. No evidence of mitral valve stenosis.  4. Mild thickening of the aortic valve. Mild focal calcification of the aortic valve. Aortic valve regurgitation is mild by color flow Doppler.  5. 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  6. Stage 1: 1: Multiple segmental abnormalities exist. See findings.  Left heart cath 10/16/2018  Prox RCA lesion is 20% stenosed.  Ost LAD to Prox LAD lesion is 100% stenosed.  Dist Cx lesion is 60% stenosed.  Post intervention, there is a 55% residual stenosis.  A stent was successfully placed.   ASSESSMENT AND PLAN:  1.  Chronic systolic heart failure due to ischemic cardiomyopathy: Plan for ICD today   2.  Coronary artery disease: Status post occlusion of the LAD with stenting of the LAD with a late presenting MI.  No current chest pain.  3.  Hypertension: Blood pressure well controlled.    Justin Bradford Jorja Loa, MD  04/29/2019 11:08 AM

## 2019-04-30 ENCOUNTER — Ambulatory Visit (HOSPITAL_COMMUNITY): Payer: Medicare Other

## 2019-04-30 DIAGNOSIS — I255 Ischemic cardiomyopathy: Secondary | ICD-10-CM | POA: Diagnosis not present

## 2019-04-30 DIAGNOSIS — I428 Other cardiomyopathies: Secondary | ICD-10-CM

## 2019-04-30 DIAGNOSIS — I110 Hypertensive heart disease with heart failure: Secondary | ICD-10-CM | POA: Diagnosis not present

## 2019-04-30 DIAGNOSIS — Z006 Encounter for examination for normal comparison and control in clinical research program: Secondary | ICD-10-CM | POA: Diagnosis not present

## 2019-04-30 DIAGNOSIS — I5022 Chronic systolic (congestive) heart failure: Secondary | ICD-10-CM | POA: Diagnosis not present

## 2019-04-30 MED FILL — Gentamicin Sulfate Inj 40 MG/ML: INTRAMUSCULAR | Qty: 80 | Status: AC

## 2019-04-30 MED FILL — Lidocaine HCl Local Inj 1%: INTRAMUSCULAR | Qty: 60 | Status: AC

## 2019-04-30 NOTE — Plan of Care (Signed)

## 2019-04-30 NOTE — Progress Notes (Signed)
  Pt notified that the power is out at his home.   He uses O2 and will not have a way to charge his tank(s) so unable to be discharged at this time.  He does not have any relatives or neighbors able to help him temporarily.    Will continue to monitor for power restoration.   Legrand Como [address] [place], PA-C  04/30/2019 12:16 PM

## 2019-05-12 ENCOUNTER — Telehealth: Payer: Self-pay

## 2019-05-12 ENCOUNTER — Ambulatory Visit (INDEPENDENT_AMBULATORY_CARE_PROVIDER_SITE_OTHER): Payer: Medicare Other | Admitting: Student

## 2019-05-12 ENCOUNTER — Other Ambulatory Visit: Payer: Self-pay

## 2019-05-12 DIAGNOSIS — I5022 Chronic systolic (congestive) heart failure: Secondary | ICD-10-CM | POA: Diagnosis not present

## 2019-05-12 LAB — CUP PACEART INCLINIC DEVICE CHECK
Date Time Interrogation Session: 20201110092714
Implantable Lead Implant Date: 20201028
Implantable Lead Location: 753860
Implantable Lead Model: 7122
Implantable Pulse Generator Implant Date: 20201028
Pulse Gen Serial Number: 111007934

## 2019-05-12 NOTE — Telephone Encounter (Signed)
I spoke with the pt to let him know he do not have to send unscheduled transmissions. I told him the only time that we will need unscheduled transmissions is when we call and ask for one. The pt verbalized understanding and thanked me for the call.

## 2019-05-12 NOTE — Progress Notes (Signed)
Wound check appointment. Steri-strips removed. Wound without redness or edema. Incision edges approximated, wound well healed. Normal device function. Thresholds, sensing, and impedances consistent with implant measurements. Device programmed at 3.5V for extra safety margin until 3 month visit. Histogram distribution appropriate for patient and level of activity. No mode switches or ventricular arrhythmias noted. Patient educated about wound care, arm mobility, lifting restrictions, shock plan. ROV in 3 months with Dr. Curt Bears.  This device is not yet capable of importing data into [employer]. Please see attached.

## 2019-06-14 NOTE — Progress Notes (Signed)
Cardiology Office Note:    Date:  06/15/2019   ID:  [name]., DOB 06-01-1941, MRN [id number]  PCP:  Forrest Moron, MD  Cardiologist:  Norman Herrlich, MD    Referring MD: Forrest Moron, MD    ASSESSMENT:    1. CAD in native artery   2. Chronic systolic heart failure (HCC)   3. Hypertensive heart disease with heart failure (HCC)   4. Mixed hyperlipidemia   5. Chronic obstructive pulmonary disease, unspecified COPD type (HCC)   6. ICD (implantable cardioverter-defibrillator) in place    PLAN:    In order of problems listed above:  1. Stable continue current treatment including dual antiplatelet and is statin 2. Remains symptomatic from mixture of both heart failure and COPD class II to class III [employer] [place] [employer] increase his diuretic and digoxin and at this time I would not uptitrate either beta-blocker with COPD or Entresto with soft blood pressure.  We will recheck proBNP level today. 3. Stable dyslipidemia continue his high intensity statin 4. Remains symptomatic has no bronchospasm and I suspect the predominant problem right now is heart failure continue his current multiple bronchodilators both maintenance and rescue. 5. Stable followed by device clinic   Next appointment: 4 weeks he will need follow-up labs including digoxin level at that time   Medication Adjustments/Labs and Tests Ordered: Current medicines are reviewed at length with the patient today.  Concerns regarding medicines are outlined above.  No orders of the defined types were placed in this encounter.  No orders of the defined types were placed in this encounter.   Chief Complaint  Patient presents with  . Follow-up  . Congestive Heart Failure    History of Present Illness:    Justin Bradford. is a 78 y.o. male with a hx of CAD s/p NSTEMI 10/16/2018 with PCI/DES to LAD, chronic combined CHF (EF 35% 10/2018), HTN, HLD, DM type 2, COPD, and recent [facility] admission June 2020 with  pneumonia and decompensated heart failure last seen 01/22/2019 and transition to [place]..  With severe LV dysfunction remote from his myocardial infarction and heart failure he was evaluated by electrophysiology and had implantation of an ICD.  He is on aspirin and clopidogrel as he was intolerant of Brilinta which shortness of breath and COPD. He was last seen 03/12/2019. Compliance with diet, lifestyle and medications: Yes  He is improved his weight is down no edema orthopnea chest pain palpitation or syncope but is just breathless with any activity he attributes most of this to his COPD.  He is not coughing or wheezing and he has no sputum production.  Although he has no peripheral edema he has lost a significant amount of body mass I suspect an element of this is decompensated heart failure will increase his diuretic and place him on low-dose digoxin.  Echo labs today including renal function proBNP and plan to see back in the office in 4 weeks.  I do not think there is any room to increase his Entresto with a blood pressure in the range of 100 systolic and with his COPD I do not want to push the dose of his beta-blocker.  Encouraged him to be optimistic he is seeing a therapist for his depression offered him cardiac rehabilitation he declined.  I told him to purchase a recumbent bike and start to bicycle even up for a few minutes at a time to build up his endurance. Past Medical History:  Diagnosis Date  .  Acute systolic heart failure (HCC)   . Arthritis   . COPD (chronic obstructive pulmonary disease) (HCC)   . Heart attack (HCC)   . Hypertension   . Peripheral neuropathy   . Pneumonia     Past Surgical History:  Procedure Laterality Date  . CARDIAC CATHETERIZATION    . CORONARY STENT INTERVENTION N/A 10/16/2018   Procedure: CORONARY STENT INTERVENTION;  Surgeon: Lennette Bihari, MD;  Location: [facility] INVASIVE CV LAB;  Service: Cardiovascular;  Laterality: N/A;  . ICD IMPLANT N/A 04/29/2019     Procedure: ICD IMPLANT;  Surgeon: Regan Lemming, MD;  Location: [facility] INVASIVE CV LAB;  Service: Cardiovascular;  Laterality: N/A;  . KNEE SURGERY  1994 & 1998  . LEFT HEART CATH AND CORONARY ANGIOGRAPHY N/A 10/16/2018   Procedure: LEFT HEART CATH AND CORONARY ANGIOGRAPHY;  Surgeon: Lennette Bihari, MD;  Location: [facility] INVASIVE CV LAB;  Service: Cardiovascular;  Laterality: N/A;  . TONSILLECTOMY      Current Medications: Current Meds  Medication Sig  . acetaminophen (TYLENOL) 325 MG tablet Take 2 tablets (650 mg total) by mouth every 4 (four) hours as needed for headache or mild pain.  . Albuterol Sulfate 108 (90 Base) MCG/ACT AEPB Inhale 1 puff into the lungs every 4 (four) hours as needed (Shortness of breath).   Marland Kitchen aspirin EC 81 MG tablet Take 81 mg by mouth daily.  Marland Kitchen atorvastatin (LIPITOR) 80 MG tablet Take 1 tablet (80 mg total) by mouth daily at 6 PM.  . carvedilol (COREG) 3.125 MG tablet Take 1 tablet (3.125 mg total) by mouth 2 (two) times daily with a meal for 30 days.  . clonazePAM (KLONOPIN) 1 MG tablet Take 1 tablet (1 mg total) by mouth at bedtime.  . clopidogrel (PLAVIX) 75 MG tablet Take 4 tablets (300 mg total) the first day.  Then take 1 tablet (75 mg total) once a day. (Patient taking differently: Take 75-300 mg by mouth See admin instructions. Take 4 tablets (300 mg total) the first day.  Then take 1 tablet (75 mg total) once a day.)  . FLUoxetine (PROZAC) 20 MG capsule Take 3 capsules (60 mg total) by mouth daily for 30 days.  . fluticasone furoate-vilanterol (BREO ELLIPTA) 200-25 MCG/INH AEPB Inhale 1 puff into the lungs daily.   . furosemide (LASIX) 80 MG tablet Take 1 tablet (80 mg total) by mouth daily. Take additional 80 mg if weight is greater than 266lb  . lamoTRIgine (LAMICTAL) 25 MG tablet Take 3 tablets (75 mg total) by mouth at bedtime.  . metFORMIN (GLUCOPHAGE) 500 MG tablet Take 0.5 tablets (250 mg total) by mouth every morning.  . nitroGLYCERIN (NITROSTAT)  0.4 MG SL tablet Place 1 tablet (0.4 mg total) under the tongue every 5 (five) minutes as needed for chest pain.  . pantoprazole (PROTONIX) 40 MG tablet Take 1 tablet (40 mg total) by mouth daily.  . potassium chloride SA (K-DUR) 20 MEQ tablet Take 2 tablets (40 mEq total) by mouth daily for 30 days. (Patient taking differently: Take 20 mEq by mouth 2 (two) times daily. )  . sacubitril-valsartan (ENTRESTO) 49-51 MG Take 1 tablet by mouth 2 (two) times daily.  Marland Kitchen spironolactone (ALDACTONE) 25 MG tablet TAKE 1/2 TABLET BY MOUTH EVERY DAY (Patient taking differently: Take 1/2 tablet by mouth daily)  . tamsulosin (FLOMAX) 0.4 MG CAPS capsule Take 1 capsule (0.4 mg total) by mouth at bedtime.     Allergies:   Patient has no known allergies.  Social History   Socioeconomic History  . Marital status: Widowed    Spouse name: Not on file  . Number of children: Not on file  . Years of education: Not on file  . Highest education level: Not on file  Occupational History  . Not on file  Tobacco Use  . Smoking status: Former Smoker    Packs/day: 2.00    Types: Cigarettes    Quit date: 12/16/2005    Years since quitting: 13.5  . Smokeless tobacco: Never Used  . Tobacco comment: quit in 2007  Substance and Sexual Activity  . Alcohol use: Not Currently  . Drug use: No  . Sexual activity: Not on file  Other Topics Concern  . Not on file  Social History Narrative  . Not on file   Social Determinants of Health   Financial Resource Strain: Low Risk   . Difficulty of Paying Living Expenses: Not hard at all  Food Insecurity: No Food Insecurity  . Worried About Programme researcher, broadcasting/film/videounning Out of Food in the Last Year: Never true  . Ran Out of Food in the Last Year: Never true  Transportation Needs: No Transportation Needs  . Lack of Transportation (Medical): No  . Lack of Transportation (Non-Medical): No  Physical Activity:   . Days of Exercise per Week: Not on file  . Minutes of Exercise per Session: Not on  file  Stress: No Stress Concern Present  . Feeling of Stress : Not at all  Social Connections: Unknown  . Frequency of Communication with Friends and Family: Not on file  . Frequency of Social Gatherings with Friends and Family: Not on file  . Attends Religious Services: Not on file  . Active Member of Clubs or Organizations: Not on file  . Attends BankerClub or Organization Meetings: Not on file  . Marital Status: Widowed     Family History: The patient's family history includes Colon cancer in his mother; Heart failure in his mother; Kidney cancer in his father and mother; Thyroid disease in his sister. ROS:   Please see the history of present illness.    All other systems reviewed and are negative.  EKGs/Labs/Other Studies Reviewed:    The following studies were reviewed today:  Recent Labs: 12/16/2018: B Natriuretic Peptide 916.8 12/17/2018: TSH 3.195 12/25/2018: Magnesium 2.2 03/12/2019: ALT 11; NT-Pro BNP 1,911 04/22/2019: BUN 16; Creatinine, Ser 1.18; Hemoglobin 12.1; Platelets 307; Potassium 4.4; Sodium 135  Recent Lipid Panel    Component Value Date/Time   CHOL 191 10/16/2018 0250   TRIG 49 10/16/2018 0250   HDL 62 10/16/2018 0250   CHOLHDL 3.1 10/16/2018 0250   VLDL 10 10/16/2018 0250   LDLCALC 119 (H) 10/16/2018 0250    Physical Exam:    VS:  BP 102/82   Pulse 72   Ht 6' (1.829 m)   Wt 258 lb (117 kg)   BMI 34.99 kg/m     Wt Readings from Last 3 Encounters:  06/15/19 258 lb (117 kg)  04/30/19 254 lb 8 oz (115.4 kg)  03/12/19 260 lb 6.4 oz (118.1 kg)     GEN: He looks chronically ill has obviously lost weight but remains obese well nourished, well developed in no acute distress HEENT: Normal NECK: No JVD; No carotid bruits LYMPHATICS: No lymphadenopathy CARDIAC: Soft S1 no S3 RRR, no murmurs, rubs, gallops RESPIRATORY:  Clear to auscultation without rales, wheezing or rhonchi  ABDOMEN: Soft, non-tender, non-distended MUSCULOSKELETAL:  No edema; No  deformity  SKIN: Warm  and dry NEUROLOGIC:  Alert and oriented x 3 PSYCHIATRIC:  Normal affect    Signed, Shirlee More, MD  06/15/2019 3:29 PM    [place]

## 2019-06-15 ENCOUNTER — Encounter: Payer: Self-pay | Admitting: Cardiology

## 2019-06-15 ENCOUNTER — Ambulatory Visit (INDEPENDENT_AMBULATORY_CARE_PROVIDER_SITE_OTHER): Payer: Medicare Other | Admitting: Cardiology

## 2019-06-15 ENCOUNTER — Other Ambulatory Visit: Payer: Self-pay

## 2019-06-15 VITALS — BP 102/82 | HR 72 | Ht 72.0 in | Wt 258.0 lb

## 2019-06-15 DIAGNOSIS — I110 Hypertensive heart disease with heart failure: Secondary | ICD-10-CM

## 2019-06-15 DIAGNOSIS — E782 Mixed hyperlipidemia: Secondary | ICD-10-CM

## 2019-06-15 DIAGNOSIS — J449 Chronic obstructive pulmonary disease, unspecified: Secondary | ICD-10-CM

## 2019-06-15 DIAGNOSIS — I2510 Atherosclerotic heart disease of native coronary artery without angina pectoris: Secondary | ICD-10-CM | POA: Diagnosis not present

## 2019-06-15 DIAGNOSIS — Z95810 Presence of automatic (implantable) cardiac defibrillator: Secondary | ICD-10-CM

## 2019-06-15 DIAGNOSIS — I5022 Chronic systolic (congestive) heart failure: Secondary | ICD-10-CM | POA: Diagnosis not present

## 2019-06-15 MED ORDER — DIGOXIN 125 MCG PO TABS: 0.1250 mg | ORAL_TABLET | Freq: Every day | ORAL | 3 refills | Status: DC

## 2019-06-15 MED ORDER — FUROSEMIDE 80 MG PO TABS: 80.0000 mg | ORAL_TABLET | Freq: Every day | ORAL | 2 refills | Status: DC

## 2019-06-15 NOTE — Patient Instructions (Addendum)
Medication Instructions:  Your physician has recommended you make the following change in your medication:   INCREASE furosemide (lasix) 80 mg: Take 1 tablet daily. Take 1 extra tablet two days a week in the afternoon.  START digoxin (lanoxin) 0.125 mg: Take 1 tablet daily   *If you need a refill on your cardiac medications before your next appointment, please call your pharmacy*  Lab Work: Your physician recommends that you return for lab work today: BMP, ProBNP.   If you have labs (blood work) drawn today and your tests are completely normal, you will receive your results only by: Marland Kitchen MyChart Message (if you have MyChart) OR . A paper copy in the mail If you have any lab test that is abnormal or we need to change your treatment, we will call you to review the results.  Testing/Procedures: None  Follow-Up: At [facility], you and your health needs are our priority.  As part of our continuing mission to provide you with exceptional heart care, we have created designated Provider Care Teams.  These Care Teams include your primary Cardiologist (physician) and Advanced Practice Providers (APPs -  Physician Assistants and Nurse Practitioners) who all work together to provide you with the care you need, when you need it.  Your next appointment:   1 month(s)  The format for your next appointment:   In Person  Provider:   Norman Herrlich, MD   Digoxin tablets or capsules What is this medicine? DIGOXIN (di JOX in) is used to treat congestive heart failure and heart rhythm problems. This medicine may be used for other purposes; ask your health care provider or pharmacist if you have questions. COMMON BRAND NAME(S): Digitek, Lanoxicaps, Lanoxin What should I tell my health care provider before I take this medicine? They need to know if you have any of these conditions:  certain heart rhythm disorders  heart disease or recent heart attack  kidney or liver disease  an unusual or  allergic reaction to digoxin, other medicines, foods, dyes, or preservatives  pregnant or trying to get pregnant  breast-feeding How should I use this medicine? Take this medicine by mouth with a glass of water. Follow the directions on the prescription label. Take your doses at regular intervals. Do not take your medicine more often than directed. Talk to your pediatrician regarding the use of this medicine in children. Special care may be needed. Overdosage: If you think you have taken too much of this medicine contact a poison control center or emergency room at once. NOTE: This medicine is only for you. Do not share this medicine with others. What if I miss a dose? If you miss a dose, take it as soon as you can. If it is almost time for your next dose, take only that dose. Do not take double or extra doses. What may interact with this medicine?  activated charcoal  albuterol  alprazolam  antacids  antiviral medicines for HIV or AIDS like ritonavir and saquinavir  calcium  certain antibiotics like azithromycin, clarithromycin, erythromycin, gentamicin, neomycin, trimethoprim, and tetracycline  certain medicines for blood pressure, heart disease, irregular heart beat  certain medicines for cancer  certain medicines for cholesterol like atorvastatin, cholestyramine, and colestipol  certain medicines for diabetes, like acarbose, exenatide, miglitol, and metformin  certain medicines for fungal infections like ketoconazole and itraconazole  certain medicines for stomach problems like omeprazole, esomeprazole, lansoprazole, rabeprazole, metoclopramide, and sucralfate  conivaptan  cyclosporine  diphenoxylate  epinephrine  kaolin; pectin  nefazodone  NSAIDS, medicines for pain and inflammation, like celecoxib, ibuprofen, or naproxen  penicillamine  phenytoin  propantheline  quinine  phenytoin  rifampin  succinylcholine  St. John's  Wort  sulfasalazine  teriparatide  thyroid hormones  tolvaptan This list may not describe all possible interactions. Give your health care provider a list of all the medicines, herbs, non-prescription drugs, or dietary supplements you use. Also tell them if you smoke, drink alcohol, or use illegal drugs. Some items may interact with your medicine. What should I watch for while using this medicine? Visit your doctor or health care professional for regular checks on your progress. Do not stop taking this medicine without the advice of your doctor or health care professional, even if you feel better. Do not change the brand you are taking, other brands may affect you differently. Check your heart rate and blood pressure regularly while you are taking this medicine. Ask your doctor or health care professional what your heart rate and blood pressure should be, and when you should contact him or her. Your doctor or health care professional also may schedule regular blood tests and electrocardiograms to check your progress. Watch your diet. Less digoxin may be absorbed from the stomach if you have a diet high in bran fiber. Do not treat yourself for coughs, colds or allergies without asking your doctor or health care professional for advice. Some ingredients can increase possible side effects. What side effects may I notice from receiving this medicine? Side effects that you should report to your doctor or health care professional as soon as possible:  allergic reactions like skin rash, itching or hives, swelling of the face, lips, or tongue  changes in behavior, mood, or mental ability  changes in vision  confusion  fast, irregular heartbeat  feeling faint or lightheaded, falls  headache  nausea, vomiting  unusual bleeding, bruising  unusually weak or tired Side effects that usually do not require medical attention (report to your doctor or health care professional if they continue or  are bothersome):  breast enlargement in men and women  diarrhea This list may not describe all possible side effects. Call your doctor for medical advice about side effects. You may report side effects to FDA at 1-800-FDA-1088. Where should I keep my medicine? Keep out of the reach of children. Store at room temperature between 15 and 30 degrees C (59 and 86 degrees F). Protect from light and moisture. Throw away any unused medicine after the expiration date. NOTE: This sheet is a summary. It may not cover all possible information. If you have questions about this medicine, talk to your doctor, pharmacist, or health care provider.  2020 Elsevier/Gold Standard (2016-06-06 15:40:26)      You should consider purchasing a recumbent bike and bicycling even if 2 or 3 minutes at a time against low resistance several times a day to build up your endurance.  Weeks you could increase 1 or 2 minutes.  Was to get up to 15 to 20 minutes at a time slowly over period of months.  Heart Failure  Weigh yourself every morning when you first wake up and record on a calender or note pad, bring this to your office visits. Using a pill tender can help with taking your medications consistently.  Limit your fluid intake to 2 liters daily  Limit your sodium intake to less than 2-3 grams daily. Ask if you need dietary teaching.  If you gain more than 3 pounds (from your  dry weight ), double your dose of diuretic for the day.  If you gain more than 5 pounds (from your dry weight), double your dose of lasix and call your heart failure doctor.  Please do not smoke tobacco since it is very bad for your heart.  Please do not drink alcohol since it can worsen your heart failure.Also avoid OTC nonsteroidal drugs, such as advil, aleve and motrin.  Try to exercise for at least 30 minutes every day because this will help your heart be more efficient. You may be eligible for supervised cardiac rehab, ask your  physician.

## 2019-06-16 LAB — BASIC METABOLIC PANEL
BUN/Creatinine Ratio: 17 (ref 10–24)
BUN: 18 mg/dL (ref 8–27)
CO2: 27 mmol/L (ref 20–29)
Calcium: 9.2 mg/dL (ref 8.6–10.2)
Chloride: 99 mmol/L (ref 96–106)
Creatinine, Ser: 1.09 mg/dL (ref 0.76–1.27)
GFR calc Af Amer: 75 mL/min/{1.73_m2} (ref >59)
GFR calc non Af Amer: 65 mL/min/{1.73_m2} (ref >59)
Glucose: 92 mg/dL (ref 65–99)
Potassium: 4.7 mmol/L (ref 3.5–5.2)
Sodium: 139 mmol/L (ref 134–144)

## 2019-06-16 LAB — PRO B NATRIURETIC PEPTIDE: NT-Pro BNP: 2347 pg/mL — ABNORMAL HIGH (ref 0–486)

## 2019-07-08 ENCOUNTER — Other Ambulatory Visit: Payer: Self-pay | Admitting: Cardiology

## 2019-07-12 NOTE — Progress Notes (Signed)
Cardiology Office Note:    Date:  07/13/2019   ID:  Justin Posey., DOB 20-Nov-1940, MRN [id number]  PCP:  Forrest Moron, MD  Cardiologist:  Norman Herrlich, MD    Referring MD: Forrest Moron, MD    ASSESSMENT:    1. Chronic systolic heart failure (HCC)   2. Hypertensive heart disease with heart failure (HCC)   3. ICD (implantable cardioverter-defibrillator) in place   4. Mixed hyperlipidemia   5. CAD in native artery   6. Chronic obstructive pulmonary disease, unspecified COPD type (HCC)   7. High risk medication use    PLAN:    In order of problems listed above:  1. His heart failure is stable he has no fluid overload continue his current diuretic and guideline directed treatment including beta-blocker low-dose with his COPD and soft blood pressure mid range Entresto digoxin and MRA.  At next visit we will consider up titration to high dose Entresto.  Recheck renal function proBNP digoxin level today 2. Stable we will try to titrate to high-dose Entresto BP allows next visit 3. Stable ICD followed by device clinic he has had no device therapy 4. Stable continue high CAD 5. Stable CAD continue medical therapy including dual antiplatelet 6. Stable COPD continue oxygen presently using 5 L 7. Check digoxin level 8. He has a gap in healthcare taking Lamictal now for 5 years stable dose no oral or skin abnormality and I told him I would give him a prescription for 30 days until he is seen by psychiatry   Next appointment: 3 months   Medication Adjustments/Labs and Tests Ordered: Current medicines are reviewed at length with the patient today.  Concerns regarding medicines are outlined above.  No orders of the defined types were placed in this encounter.  No orders of the defined types were placed in this encounter.   Chief Complaint  Patient presents with  . Congestive Heart Failure    History of Present Illness:    Justin Tillett. is a 79 y.o. male with a hx  of  CAD s/p NSTEMI 10/16/2018 with PCI/DES to LAD, chronic combined CHF (EF 35% 10/2018), HTN, HLD, DM type 2, COPD, and recent [facility] admission June 2020 with pneumonia and decompensated heart failure seen 01/22/2019 and transition to [place]..  With severe LV dysfunction remote from his myocardial infarction and heart failure he was evaluated by electrophysiology and had implantation of an ICD.  He is on aspirin and clopidogrel as he was intolerant of Brilinta which shortness of breath and COPD.  His guideline directed treatment includes low-dose carvedilol with COPD and soft blood pressure digoxin loop diuretic MRA in mid range Entresto.  last seen 06/15/2019.titration of Entresto.. Compliance with diet, lifestyle and medications: Yes  I reviewed the studies below with the patient.  He tolerates higher dose Entresto without difficulty tells me in terms of heart failure thinks is doing well no edema orthopnea chest pain palpitation or syncope but struggles predominantly because his oxygen dependency and COPD.  He has had no ICD discharge.   Ref Range & Units 3 wk ago 4 mo ago 6 mo ago  NT-Pro BNP 0 - 486 pg/mL 2,347High   1,911High  CM  2,784High    Recent echocardiogram 12/10/2018: Persistent severe left ventricular dysfunction see below  1. The left ventricle has moderately reduced systolic function, with an ejection fraction of 35%. The cavity size was normal. There is moderate concentric left ventricular hypertrophy. Left ventricular diastolic Doppler  parameters are indeterminate.  2. The right ventricle has normal systolic function. The cavity was normal. There is no increase in right ventricular wall thickness.  3. The mitral valve is degenerative. There is mild to moderate mitral annular calcification present. No evidence of mitral valve stenosis.  4. Mild thickening of the aortic valve. Mild focal calcification of the aortic valve. Aortic valve regurgitation is mild by color flow Doppler.  5.  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 Past Medical History:  Diagnosis Date  . Acute systolic heart failure (HCC)   . Arthritis   . COPD (chronic obstructive pulmonary disease) (HCC)   . Heart attack (HCC)   . Hypertension   . Peripheral neuropathy   . Pneumonia     Past Surgical History:  Procedure Laterality Date  . CARDIAC CATHETERIZATION    . CORONARY STENT INTERVENTION N/A 10/16/2018   Procedure: CORONARY STENT INTERVENTION;  Surgeon: Lennette Bihari, MD;  Location: [facility] INVASIVE CV LAB;  Service: Cardiovascular;  Laterality: N/A;  . ICD IMPLANT N/A 04/29/2019   Procedure: ICD IMPLANT;  Surgeon: Regan Lemming, MD;  Location: [facility] INVASIVE CV LAB;  Service: Cardiovascular;  Laterality: N/A;  . KNEE SURGERY  1994 & 1998  . LEFT HEART CATH AND CORONARY ANGIOGRAPHY N/A 10/16/2018   Procedure: LEFT HEART CATH AND CORONARY ANGIOGRAPHY;  Surgeon: Lennette Bihari, MD;  Location: [facility] INVASIVE CV LAB;  Service: Cardiovascular;  Laterality: N/A;  . TONSILLECTOMY      Current Medications: Current Meds  Medication Sig  . acetaminophen (TYLENOL) 325 MG tablet Take 2 tablets (650 mg total) by mouth every 4 (four) hours as needed for headache or mild pain.  . Albuterol Sulfate 108 (90 Base) MCG/ACT AEPB Inhale 1 puff into the lungs every 4 (four) hours as needed (Shortness of breath).   Marland Kitchen aspirin EC 81 MG tablet Take 81 mg by mouth daily.  Marland Kitchen atorvastatin (LIPITOR) 80 MG tablet Take 1 tablet (80 mg total) by mouth daily at 6 PM.  . carvedilol (COREG) 3.125 MG tablet Take 1 tablet (3.125 mg total) by mouth 2 (two) times daily with a meal for 30 days.  . clonazePAM (KLONOPIN) 1 MG tablet Take 1 tablet (1 mg total) by mouth at bedtime.  . clopidogrel (PLAVIX) 75 MG tablet Take 4 tablets (300 mg total) the first day.  Then take 1 tablet (75 mg total) once a day. (Patient taking differently: Take 75-300 mg by mouth See admin instructions. Take 4 tablets (300 mg total)  the first day.  Then take 1 tablet (75 mg total) once a day.)  . digoxin (LANOXIN) 0.125 MG tablet Take 1 tablet (0.125 mg total) by mouth daily.  Marland Kitchen FLUoxetine (PROZAC) 20 MG capsule Take 3 capsules (60 mg total) by mouth daily for 30 days.  . fluticasone furoate-vilanterol (BREO ELLIPTA) 200-25 MCG/INH AEPB Inhale 1 puff into the lungs daily.   . furosemide (LASIX) 80 MG tablet Take 1 tablet (80 mg total) by mouth daily. Take 1 extra tablet 2 days a week in the afternoon.  . lamoTRIgine (LAMICTAL) 25 MG tablet Take 3 tablets (75 mg total) by mouth at bedtime.  . metFORMIN (GLUCOPHAGE) 500 MG tablet Take 0.5 tablets (250 mg total) by mouth every morning.  . OXYGEN Inhale 5 L into the lungs continuous.   . pantoprazole (PROTONIX) 40 MG tablet Take 1 tablet (40 mg total) by mouth daily.  . potassium chloride SA (K-DUR) 20 MEQ tablet  Take 2 tablets (40 mEq total) by mouth daily for 30 days. (Patient taking differently: Take 20 mEq by mouth 2 (two) times daily. )  . sacubitril-valsartan (ENTRESTO) 49-51 MG Take 1 tablet by mouth 2 (two) times daily.  Marland Kitchen spironolactone (ALDACTONE) 25 MG tablet TAKE 1/2 TABLET BY MOUTH EVERY DAY (Patient taking differently: Take 1/2 tablet by mouth daily)  . tamsulosin (FLOMAX) 0.4 MG CAPS capsule Take 1 capsule (0.4 mg total) by mouth at bedtime.     Allergies:   Patient has no known allergies.   Social History   Socioeconomic History  . Marital status: Widowed    Spouse name: Not on file  . Number of children: Not on file  . Years of education: Not on file  . Highest education level: Not on file  Occupational History  . Not on file  Tobacco Use  . Smoking status: Former Smoker    Packs/day: 2.00    Types: Cigarettes    Quit date: 12/16/2005    Years since quitting: 13.5  . Smokeless tobacco: Never Used  . Tobacco comment: quit in 2007  Substance and Sexual Activity  . Alcohol use: Not Currently  . Drug use: No  . Sexual activity: Not on file  Other  Topics Concern  . Not on file  Social History Narrative  . Not on file   Social Determinants of Health   Financial Resource Strain: Low Risk   . Difficulty of Paying Living Expenses: Not hard at all  Food Insecurity: No Food Insecurity  . Worried About Charity fundraiser in the Last Year: Never true  . Ran Out of Food in the Last Year: Never true  Transportation Needs: No Transportation Needs  . Lack of Transportation (Medical): No  . Lack of Transportation (Non-Medical): No  Physical Activity:   . Days of Exercise per Week: Not on file  . Minutes of Exercise per Session: Not on file  Stress: No Stress Concern Present  . Feeling of Stress : Not at all  Social Connections: Unknown  . Frequency of Communication with Friends and Family: Not on file  . Frequency of Social Gatherings with Friends and Family: Not on file  . Attends Religious Services: Not on file  . Active Member of Clubs or Organizations: Not on file  . Attends Archivist Meetings: Not on file  . Marital Status: Widowed     Family History: The patient's family history includes Colon cancer in his mother; Heart failure in his mother; Kidney cancer in his father and mother; Thyroid disease in his sister. ROS:   Please see the history of present illness.    All other systems reviewed and are negative.  EKGs/Labs/Other Studies Reviewed:    The following studies were reviewed today:  Recent Labs: 12/16/2018: B Natriuretic Peptide 916.8 12/17/2018: TSH 3.195 12/25/2018: Magnesium 2.2 03/12/2019: ALT 11 04/22/2019: Hemoglobin 12.1; Platelets 307 06/15/2019: BUN 18; Creatinine, Ser 1.09; NT-Pro BNP 2,347; Potassium 4.7; Sodium 139  Recent Lipid Panel    Component Value Date/Time   CHOL 191 10/16/2018 0250   TRIG 49 10/16/2018 0250   HDL 62 10/16/2018 0250   CHOLHDL 3.1 10/16/2018 0250   VLDL 10 10/16/2018 0250   LDLCALC 119 (H) 10/16/2018 0250    Physical Exam:    VS:  BP 122/68   Pulse 68   Ht  6' (1.829 m)   Wt 262 lb (118.8 kg)   SpO2 99% Comment: 5L  BMI 35.53 kg/m  Wt Readings from Last 3 Encounters:  07/13/19 262 lb (118.8 kg)  06/15/19 258 lb (117 kg)  04/30/19 254 lb 8 oz (115.4 kg)     GEN:  Well nourished, well developed in no acute distress HEENT: Normal NECK: No JVD; No carotid bruits LYMPHATICS: No lymphadenopathy CARDIAC: RRR, no murmurs, rubs, gallops RESPIRATORY:  Clear to auscultation without rales, wheezing or rhonchi  ABDOMEN: Soft, non-tender, non-distended MUSCULOSKELETAL:  No edema; No deformity  SKIN: Warm and dry NEUROLOGIC:  Alert and oriented x 3 PSYCHIATRIC:  Normal affect    Signed, Norman Herrlich, MD  07/13/2019 1:34 PM    [facility]

## 2019-07-13 ENCOUNTER — Other Ambulatory Visit: Payer: Self-pay

## 2019-07-13 ENCOUNTER — Encounter: Payer: Self-pay | Admitting: Cardiology

## 2019-07-13 ENCOUNTER — Ambulatory Visit (INDEPENDENT_AMBULATORY_CARE_PROVIDER_SITE_OTHER): Payer: Medicare Other | Admitting: Cardiology

## 2019-07-13 VITALS — BP 122/68 | HR 68 | Ht 72.0 in | Wt 262.0 lb

## 2019-07-13 DIAGNOSIS — J449 Chronic obstructive pulmonary disease, unspecified: Secondary | ICD-10-CM

## 2019-07-13 DIAGNOSIS — I2510 Atherosclerotic heart disease of native coronary artery without angina pectoris: Secondary | ICD-10-CM

## 2019-07-13 DIAGNOSIS — E782 Mixed hyperlipidemia: Secondary | ICD-10-CM

## 2019-07-13 DIAGNOSIS — Z95810 Presence of automatic (implantable) cardiac defibrillator: Secondary | ICD-10-CM | POA: Diagnosis not present

## 2019-07-13 DIAGNOSIS — I5022 Chronic systolic (congestive) heart failure: Secondary | ICD-10-CM

## 2019-07-13 DIAGNOSIS — I110 Hypertensive heart disease with heart failure: Secondary | ICD-10-CM | POA: Diagnosis not present

## 2019-07-13 DIAGNOSIS — Z79899 Other long term (current) drug therapy: Secondary | ICD-10-CM

## 2019-07-13 MED ORDER — LAMOTRIGINE 25 MG PO TABS: 25.0000 mg | ORAL_TABLET | Freq: Three times a day (TID) | ORAL | 0 refills | Status: AC

## 2019-07-13 NOTE — Patient Instructions (Signed)
Medication Instructions:  I will prescribe 1 time Lamictal 25 MG for 20 days until you schedule an appointment with psychiatry.   *If you need a refill on your cardiac medications before your next appointment, please call your pharmacy*  Lab Work: None If you have labs (blood work) drawn today and your tests are completely normal, you will receive your results only by: Marland Kitchen MyChart Message (if you have MyChart) OR . A paper copy in the mail If you have any lab test that is abnormal or we need to change your treatment, we will call you to review the results.  Testing/Procedures: None   Follow-Up: At [facility], you and your health needs are our priority.  As part of our continuing mission to provide you with exceptional heart care, we have created designated Provider Care Teams.  These Care Teams include your primary Cardiologist (physician) and Advanced Practice Providers (APPs -  Physician Assistants and Nurse Practitioners) who all work together to provide you with the care you need, when you need it.  Your next appointment:   2 month(s)  The format for your next appointment:   In Person  Provider:   Norman Herrlich, MD  Other Instructions None

## 2019-07-14 ENCOUNTER — Other Ambulatory Visit: Payer: Self-pay | Admitting: Cardiology

## 2019-07-15 ENCOUNTER — Telehealth: Payer: Self-pay | Admitting: *Deleted

## 2019-07-15 NOTE — Telephone Encounter (Signed)
Telephone call to patient. Left message with lab results and to call with any questions. 

## 2019-07-15 NOTE — Telephone Encounter (Signed)
-----   Message from Baldo Daub, MD sent at 07/15/2019  7:49 AM EST ----- Normal or stable result  No changes

## 2019-07-16 LAB — BASIC METABOLIC PANEL
BUN/Creatinine Ratio: 14 (ref 10–24)
BUN: 14 mg/dL (ref 8–27)
CO2: 26 mmol/L (ref 20–29)
Calcium: 9.1 mg/dL (ref 8.6–10.2)
Chloride: 101 mmol/L (ref 96–106)
Creatinine, Ser: 1.03 mg/dL (ref 0.76–1.27)
GFR calc Af Amer: 81 mL/min/{1.73_m2} (ref >59)
GFR calc non Af Amer: 70 mL/min/{1.73_m2} (ref >59)
Glucose: 99 mg/dL (ref 65–99)
Potassium: 4.8 mmol/L (ref 3.5–5.2)
Sodium: 139 mmol/L (ref 134–144)

## 2019-07-16 LAB — BRAIN NATRIURETIC PEPTIDE: BNP: 714.6 pg/mL — ABNORMAL HIGH (ref 0.0–100.0)

## 2019-07-16 LAB — DIGOXIN LEVEL

## 2019-07-26 ENCOUNTER — Other Ambulatory Visit: Payer: Self-pay | Admitting: Cardiology

## 2019-07-30 ENCOUNTER — Telehealth: Payer: Self-pay

## 2019-07-30 NOTE — Telephone Encounter (Signed)
ICD alert received, pt had VF episode 1/27 2350, ATP x1 successful.  Pt has upcoming OV with Dr. Elberta Fortis on 2/2.  Reviewed with ASeiler, NP.    Spoke with pt, he was symptomatic at the time, recalls he was sitting in recliner and suddenly felt dizzy, he was unable to stand up, symptoms resolved after a few minutes.  Pt feels good now, asymptomatic. Educated on shock management if a shock occurs to go to ED if symptomatic or multiple shocks occur in 24 hours period, otherwise call office if asymptomatic.  No driving for 6 months.  Keep upcoming appt with Dr. Elberta Fortis.

## 2019-08-03 ENCOUNTER — Encounter: Payer: Medicare Other | Admitting: Cardiology

## 2019-08-04 ENCOUNTER — Encounter: Payer: Self-pay | Admitting: Cardiology

## 2019-08-04 ENCOUNTER — Other Ambulatory Visit: Payer: Self-pay

## 2019-08-04 ENCOUNTER — Ambulatory Visit (INDEPENDENT_AMBULATORY_CARE_PROVIDER_SITE_OTHER): Payer: Medicare Other | Admitting: *Deleted

## 2019-08-04 ENCOUNTER — Ambulatory Visit (INDEPENDENT_AMBULATORY_CARE_PROVIDER_SITE_OTHER): Payer: Medicare Other | Admitting: Cardiology

## 2019-08-04 VITALS — BP 124/72 | HR 75 | Ht 72.0 in | Wt 260.6 lb

## 2019-08-04 DIAGNOSIS — I5022 Chronic systolic (congestive) heart failure: Secondary | ICD-10-CM

## 2019-08-04 DIAGNOSIS — I255 Ischemic cardiomyopathy: Secondary | ICD-10-CM | POA: Diagnosis not present

## 2019-08-04 LAB — CUP PACEART REMOTE DEVICE CHECK
Battery Remaining Longevity: 99 mo
Battery Remaining Percentage: 95.5 %
Battery Voltage: 3.08 V
Brady Statistic RV Percent Paced: 1 %
Date Time Interrogation Session: 20210202022249
HighPow Impedance: 64 Ohm
Implantable Lead Implant Date: 20201028
Implantable Lead Location: 753860
Implantable Lead Model: 7122
Implantable Pulse Generator Implant Date: 20201028
Lead Channel Impedance Value: 460 Ohm
Lead Channel Pacing Threshold Amplitude: 0.5 V
Lead Channel Pacing Threshold Pulse Width: 0.5 ms
Lead Channel Sensing Intrinsic Amplitude: 12 mV
Lead Channel Setting Pacing Amplitude: 3.5 V
Lead Channel Setting Pacing Pulse Width: 0.5 ms
Lead Channel Setting Sensing Sensitivity: 0.5 mV
Pulse Gen Serial Number: 111007934

## 2019-08-04 MED ORDER — CARVEDILOL 6.25 MG PO TABS: 6.2500 mg | ORAL_TABLET | Freq: Two times a day (BID) | ORAL | 3 refills | Status: AC

## 2019-08-04 NOTE — Progress Notes (Signed)
ICD Remote  

## 2019-08-04 NOTE — Patient Instructions (Signed)
Medication Instructions:  Your physician has recommended you make the following change in your medication:  1. INCREASE Carvedilol to 6.25 mg twice a day  *If you need a refill on your cardiac medications before your next appointment, please call your pharmacy*  Labwork: None ordered  Testing/Procedures: None ordered  Follow-Up: Remote monitoring is used to monitor your Pacemaker or ICD from home. This monitoring reduces the number of office visits required to check your device to one time per year. It allows [place] to keep an eye on the functioning of your device to ensure it is working properly. You are scheduled for a device check from home on 11/02/2019. You may send your transmission at any time that day. If you have a wireless device, the transmission will be sent automatically. After your physician reviews your transmission, you will receive a postcard with your next transmission date.  At [facility], you and your health needs are our priority.  As part of our continuing mission to provide you with exceptional heart care, we have created designated Provider Care Teams.  These Care Teams include your primary Cardiologist (physician) and Advanced Practice Providers (APPs -  Physician Assistants and Nurse Practitioners) who all work together to provide you with the care you need, when you need it.  You will need a follow up appointment in 6 months.  Please call our office 2 months in advance to schedule this appointment.  You may see Dr Elberta Fortis or one of the following Advanced Practice Providers on your designated Care Team:    Gypsy Balsam, NP  Francis Dowse, PA-C  Otilio Saber, [place]  Thank you for choosing [facility]!!   Dory Horn, RN [phone]  Any Other Special Instructions Will Be Listed Below (If Applicable).

## 2019-08-04 NOTE — Progress Notes (Signed)
Electrophysiology Office Note   Date:  08/04/2019   ID:  Justin Shein., DOB 1941-04-16, MRN [id number]  PCP:  Charleston Poot, MD  Cardiologist: Bettina Gavia Primary Electrophysiologist:  Mel Langan Meredith Leeds, MD    No chief complaint on file.    History of Present Illness: Justin Roehrig. is a 79 y.o. male who is being seen today for the evaluation of ischemic cardiomyopathy at the request of Shirlee More. Presenting today for electrophysiology evaluation.  He has a history significant for ischemic cardiomyopathy, hypertension, coronary artery disease.  He had a late presentation of an anterior MI with a 100% occluded LAD in August 2020.  During that time, he developed atrial fibrillation with rapid rates.  He was initially put on amiodarone, but has had no further recurrences.  Repeat echocardiogram shows an ejection fraction of 35%.  07/28/2018, he had an episode of ventricular tachycardia noted on device interrogation.  This was treated with ATP therapy which terminated tachycardia after one burst.  Today, denies symptoms of palpitations, chest pain, shortness of breath, orthopnea, PND, lower extremity edema, claudication, dizziness, presyncope, syncope, bleeding, or neurologic sequela. The patient is tolerating medications without difficulties.     Past Medical History:  Diagnosis Date  . Acute systolic heart failure ([place])   . Arthritis   . COPD (chronic obstructive pulmonary disease) ([place])   . Heart attack ([place])   . Hypertension   . Peripheral neuropathy   . Pneumonia    Past Surgical History:  Procedure Laterality Date  . CARDIAC CATHETERIZATION    . CORONARY STENT INTERVENTION N/A 10/16/2018   Procedure: CORONARY STENT INTERVENTION;  Surgeon: Troy Sine, MD;  Location: [place] CV LAB;  Service: Cardiovascular;  Laterality: N/A;  . ICD IMPLANT N/A 04/29/2019   Procedure: ICD IMPLANT;  Surgeon: Constance Haw, MD;  Location: [place] CV LAB;  Service:  Cardiovascular;  Laterality: N/A;  . [place]  . LEFT HEART CATH AND CORONARY ANGIOGRAPHY N/A 10/16/2018   Procedure: LEFT HEART CATH AND CORONARY ANGIOGRAPHY;  Surgeon: Troy Sine, MD;  Location: [place] CV LAB;  Service: Cardiovascular;  Laterality: N/A;  . TONSILLECTOMY       Current Outpatient Medications  Medication Sig Dispense Refill  . acetaminophen (TYLENOL) 325 MG tablet Take 2 tablets (650 mg total) by mouth every 4 (four) hours as needed for headache or mild pain.    . Albuterol Sulfate 108 (90 Base) MCG/ACT AEPB Inhale 1 puff into the lungs every 4 (four) hours as needed (Shortness of breath).     Marland Kitchen aspirin EC 81 MG tablet Take 81 mg by mouth daily.    Marland Kitchen atorvastatin (LIPITOR) 80 MG tablet Take 1 tablet (80 mg total) by mouth daily at 6 PM. 30 tablet 0  . clonazePAM (KLONOPIN) 1 MG tablet Take 1 tablet (1 mg total) by mouth at bedtime. 20 tablet 0  . clopidogrel (PLAVIX) 75 MG tablet Take 4 tablets (300 mg total) the first day.  Then take 1 tablet (75 mg total) once a day. 30 tablet 6  . digoxin (LANOXIN) 0.125 MG tablet Take 1 tablet (0.125 mg total) by mouth daily. 30 tablet 3  . ENTRESTO 49-51 MG PLEASE SEE ATTACHED FOR DETAILED DIRECTIONS 60 tablet 2  . FLUoxetine (PROZAC) 20 MG capsule Take 3 capsules (60 mg total) by mouth daily for 30 days. 90 capsule 0  . fluticasone furoate-vilanterol (BREO ELLIPTA) 200-25 MCG/INH AEPB Inhale 1  puff into the lungs daily.     . furosemide (LASIX) 80 MG tablet TAKE 1 TABLET (80 MG TOTAL) BY MOUTH DAILY. TAKE ADDITIONAL 80 MG IF WEIGHT IS GREATER THAN 266LB 90 tablet 0  . lamoTRIgine (LAMICTAL) 25 MG tablet Take 1 tablet (25 mg total) by mouth 3 (three) times daily. 60 tablet 0  . metFORMIN (GLUCOPHAGE) 500 MG tablet Take 0.5 tablets (250 mg total) by mouth every morning. 30 tablet 0  . OXYGEN Inhale 5 L into the lungs continuous.     . pantoprazole (PROTONIX) 40 MG tablet Take 1 tablet (40 mg total) by mouth daily.  30 tablet 0  . potassium chloride SA (K-DUR) 20 MEQ tablet Take 2 tablets (40 mEq total) by mouth daily for 30 days. 60 tablet 0  . spironolactone (ALDACTONE) 25 MG tablet TAKE 1/2 TABLET BY MOUTH EVERY DAY 45 tablet 1  . tamsulosin (FLOMAX) 0.4 MG CAPS capsule Take 1 capsule (0.4 mg total) by mouth at bedtime. 30 capsule 0  . carvedilol (COREG) 6.25 MG tablet Take 1 tablet (6.25 mg total) by mouth 2 (two) times daily. 180 tablet 3  . nitroGLYCERIN (NITROSTAT) 0.4 MG SL tablet Place 1 tablet (0.4 mg total) under the tongue every 5 (five) minutes as needed for chest pain. 25 tablet 3   No current facility-administered medications for this visit.    Allergies:   Patient has no known allergies.   Social History:  The patient  reports that he quit smoking about 13 years ago. His smoking use included cigarettes. He smoked 2.00 packs per day. He has never used smokeless tobacco. He reports previous alcohol use. He reports that he does not use drugs.   Family History:  The patient's family history includes Colon cancer in his mother; Heart failure in his mother; Kidney cancer in his father and mother; Thyroid disease in his sister.    ROS:  Please see the history of present illness.   Otherwise, review of systems is positive for none.   All other systems are reviewed and negative.   PHYSICAL EXAM: VS:  BP 124/72   Pulse 75   Ht 6' (1.829 m)   Wt 260 lb 9.6 oz (118.2 kg)   SpO2 97%   BMI 35.34 kg/m  , BMI Body mass index is 35.34 kg/m. GEN: Well nourished, well developed, in no acute distress  HEENT: normal  Neck: no JVD, carotid bruits, or masses Cardiac: RRR; no murmurs, rubs, or gallops,no edema  Respiratory:  clear to auscultation bilaterally, normal work of breathing GI: soft, nontender, nondistended, + BS MS: no deformity or atrophy  Skin: warm and dry, device site well healed Neuro:  Strength and sensation are intact Psych: euthymic mood, full affect  EKG:  EKG is ordered  today. Personal review of the ekg ordered shows sinus rhythm, IVCD, rate 75  Personal review of the device interrogation today. Results in [facility]    Recent Labs: 12/17/2018: TSH 3.195 12/25/2018: Magnesium 2.2 03/12/2019: ALT 11 04/22/2019: Hemoglobin 12.1; Platelets 307 06/15/2019: NT-Pro BNP 2,347 07/13/2019: BNP 714.6; BUN 14; Creatinine, Ser 1.03; Potassium 4.8; Sodium 139    Lipid Panel     Component Value Date/Time   CHOL 191 10/16/2018 0250   TRIG 49 10/16/2018 0250   HDL 62 10/16/2018 0250   CHOLHDL 3.1 10/16/2018 0250   VLDL 10 10/16/2018 0250   LDLCALC 119 (H) 10/16/2018 0250     Wt Readings from Last 3 Encounters:  08/04/19 260 lb  9.6 oz (118.2 kg)  07/13/19 262 lb (118.8 kg)  06/15/19 258 lb (117 kg)      Other studies Reviewed: Additional studies/ records that were reviewed today include: TTE 12/10/2018 Review of the above records today demonstrates:   1. The left ventricle has moderately reduced systolic function, with an ejection fraction of 35%. The cavity size was normal. There is moderate concentric left ventricular hypertrophy. Left ventricular diastolic Doppler parameters are indeterminate.  2. The right ventricle has normal systolic function. The cavity was normal. There is no increase in right ventricular wall thickness.  3. The mitral valve is degenerative. There is mild to moderate mitral annular calcification present. No evidence of mitral valve stenosis.  4. Mild thickening of the aortic valve. Mild focal calcification of the aortic valve. Aortic valve regurgitation is mild by color flow Doppler.  5. 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  6. Stage 1: 1: Multiple segmental abnormalities exist. See findings.  Left heart cath 10/16/2018  Prox RCA lesion is 20% stenosed.  Ost LAD to Prox LAD lesion is 100% stenosed.  Dist Cx lesion is 60% stenosed.  Post intervention, there is a 55% residual stenosis.  A stent was  successfully placed.   ASSESSMENT AND PLAN:  1.  Chronic systolic heart failure due to ischemic cardiomyopathy: NYHA class II symptoms on optimal medical therapy with Entresto, beta-blocker, Aldactone.  Status post Saint Jude ICD implanted 04/29/2019.  Device functioning appropriately.  No changes.  2.  Coronary artery disease: Late presenting MI with occlusion of the LAD.  No current chest pain.  Status post LAD stent.  3.  Hypertension: Currently well controlled.  4.  Ventricular tachycardia: Received ATP therapy 07/28/2018.  He was symptomatic with dizziness while sitting in a chair.  We Sameen Leas thus increase his carvedilol to 6.25 mg.  He knows not to drive for the next 6 months.  Current medicines are reviewed at length with the patient today.   The patient does not have concerns regarding his medicines.  The following changes were made today: Increase carvedilol  Labs/ tests ordered today include:  Orders Placed This Encounter  Procedures  . EKG 12-Lead    Disposition:   FU with Kilynn Fitzsimmons 6 months  Signed, Mackenna Kamer Jorja Loa, MD  08/04/2019 3:38 PM     [facility] [address] [place] [address] [phone] (office) [phone] (fax)

## 2019-08-05 ENCOUNTER — Other Ambulatory Visit: Payer: Self-pay | Admitting: Cardiology

## 2019-08-22 IMAGING — CT CT ANGIOGRAPHY CHEST
1 of 8 series · 4 of 16 positions shown · IV contrast (omnipaque)
Comparison: One-view chest x-ray 12/18/2018. CT of the chest
10/23/2018

CLINICAL DATA: Shortness of breath for 1 week. Symptoms became
worse yesterday.

EXAM:
CT ANGIOGRAPHY CHEST WITH CONTRAST
TECHNIQUE: Multidetector CT imaging of the chest was performed using the
standard protocol during bolus administration of intravenous
contrast. Multiplanar CT image reconstructions and MIPs were
obtained to evaluate the vascular anatomy.
CONTRAST:  75mL OMNIPAQUE IOHEXOL 350 MG/ML SOLN

[Series 7: pe thins · axial · 0.98mm/px · z∈[-255,-94]mm · 4 of 387 slices shown]
[im 78/387  lung]
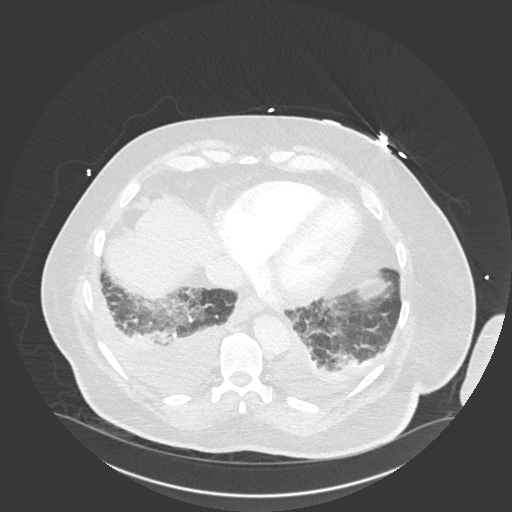
[im 155/387  soft-tissue]
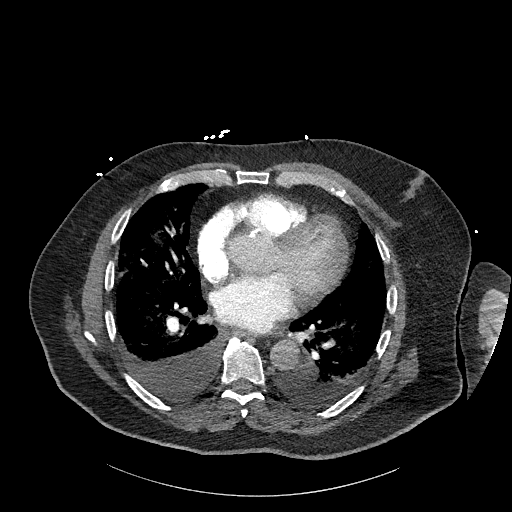
[im 232/387  lung]
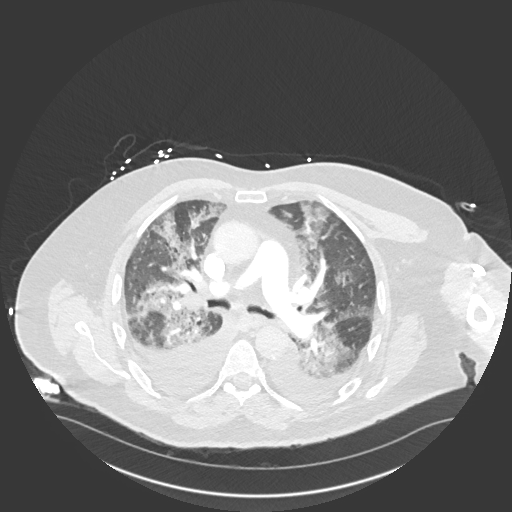
[im 309/387  soft-tissue]
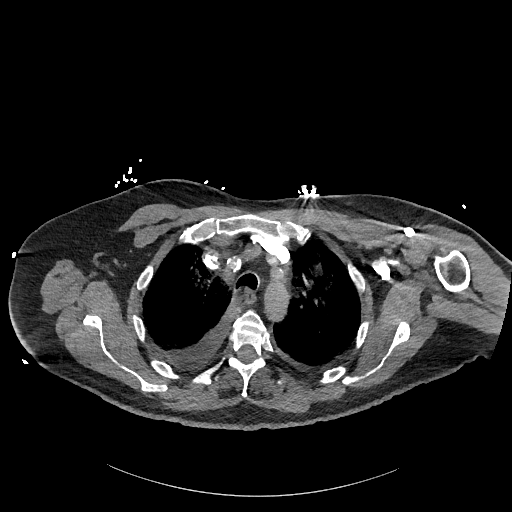

[4 of 16 positions shown; findings below may reference images not displayed]

FINDINGS: Cardiovascular: The heart size is normal. Coronary artery
calcifications are present in the LAD and right circumflex artery.
No significant pericardial effusion is present. Atherosclerotic
changes are noted in the aorta and great vessel origins. There is no
aortic aneurysm. No significant stenosis is evident at the origins
of the great vessels.

Pulmonary artery opacification is excellent. No focal filling
defects are present to suggest pulmonary embolus.

Mediastinum/Nodes: A right paratracheal node near the carina
measures 12 mm in short axis. Small prevascular nodes are present.
Other subcentimeter paratracheal nodes are present. Asymmetric right
hilar adenopathy is noted.

Lungs/Pleura: Diffuse patchy bilateral airspace opacities are
present. Moderate bilateral pleural effusions are noted.

Upper Abdomen: Visualized upper abdomen is unremarkable.

Musculoskeletal: Vertebral body heights and alignment are
maintained. No focal lytic or blastic lesions are present. Sternum
is unremarkable. Ribs are within normal limits bilaterally.

Review of the MIP images confirms the above findings.
IMPRESSION: 1. No pulmonary embolus.
2. Diffuse patchy airspace disease bilaterally. Findings are most
concerning for infection.
3. Bilateral pleural effusions. Airspace disease could represent
edema and heart failure.
4.  Aortic Atherosclerosis ([id number]).
5. Coronary artery disease.

## 2019-09-08 NOTE — Progress Notes (Signed)
Cardiology Office Note:    Date:  09/09/2019   ID:  Justin Rout., DOB [birth year], MRN [id number]  PCP:  Charleston Poot, MD  Cardiologist:  Shirlee More, MD    Referring MD: Charleston Poot, MD    ASSESSMENT:    1. Chronic systolic heart failure ([place])   2. Hypertensive heart disease with heart failure ([place])   3. CAD in native artery   4. ICD (implantable cardioverter-defibrillator) in place   5. Mixed hyperlipidemia   6. High risk medication use   7. Chronic obstructive pulmonary disease, unspecified COPD type ([place])    PLAN:    In order of problems listed above:  1. His heart failure is compensated he has no volume overload he is on appropriate guideline directed therapy recheck labs BMP magnesium proBNP and digoxin level. 2. BP at target continue guideline directed treatment 3. Stable CAD drop clopidogrel 1 year anniversary of PCI 4. Stable managed in our device clinic 5. Continue with statin 6. Check digoxin level 7. Worsening poor quality of life I will place a palliative care consult at the request of the patient his primary goal in life is to avoid a nursing home he has no children and depends on neighbors for transportation and assistance   Next appointment: 3 months   Medication Adjustments/Labs and Tests Ordered: Current medicines are reviewed at length with the patient today.  Concerns regarding medicines are outlined above.  No orders of the defined types were placed in this encounter.  No orders of the defined types were placed in this encounter.   Chief Complaint  Patient presents with  . Follow-up  . Congestive Heart Failure    History of Present Illness:    Justin Pfeifer. is a 79 y.o. male with a hx of  CAD s/p NSTEMI 10/16/2018 with PCI/DES to LAD, chronic combined CHF (EF 35% 10/2018), HTN, HLD, DM type 2, COPD, and recent [facility] admission June 2020 with pneumonia and decompensated heart failure seen 01/22/2019 and transition to [place]..   With severe LV dysfunction remote from his myocardial infarction and heart failure he was evaluated by electrophysiology and had implantation of an ICD.  He is on aspirin and clopidogrel as he was intolerant of Brilinta which shortness of breath and COPD.  His guideline directed treatment includes low-dose carvedilol with COPD and soft blood pressure digoxin loop diuretic MRA in mid range Entresto.   He was last seen 2021.  Compliance with diet, lifestyle and medications: Yes  An episode of VT detected on device no loss of consciousness and it really upset him.  He thinks that with the progression of his lung disease therapy palliative care center request referral to palliative care consultation.  He short of breath with any activity even ADLs but has no edema orthopnea chest pain palpitation or syncope.  He is coming up in 1 year from PCI and stent will drop clopidogrel recheck labs today looking for low potassium low magnesium or digitalis toxicity as a precipitant of his ventricular tachycardia ventricular fibrillation Past Medical History:  Diagnosis Date  . Acute systolic heart failure ([place])   . Arthritis   . COPD (chronic obstructive pulmonary disease) ([place])   . Heart attack ([place])   . Hypertension   . Peripheral neuropathy   . Pneumonia     Past Surgical History:  Procedure Laterality Date  . CARDIAC CATHETERIZATION    . CORONARY STENT INTERVENTION N/A 10/16/2018   Procedure: CORONARY STENT INTERVENTION;  Surgeon: Lennette Bihari, MD;  [facility] CV LAB;  Service: Cardiovascular;  Laterality: N/A;  . ICD IMPLANT N/A 04/29/2019   Procedure: ICD IMPLANT;  Surgeon: Regan Lemming, MD;  Location: [facility] INVASIVE CV LAB;  Service: Cardiovascular;  Laterality: N/A;  . KNEE SURGERY  1994 & 1998  . LEFT HEART CATH AND CORONARY ANGIOGRAPHY N/A 10/16/2018   Procedure: LEFT HEART CATH AND CORONARY ANGIOGRAPHY;  Surgeon: Lennette Bihari, MD;  Location: [facility] INVASIVE CV LAB;  Service:  Cardiovascular;  Laterality: N/A;  . TONSILLECTOMY      Current Medications: Current Meds  Medication Sig  . acetaminophen (TYLENOL) 325 MG tablet Take 2 tablets (650 mg total) by mouth every 4 (four) hours as needed for headache or mild pain.  . Albuterol Sulfate 108 (90 Base) MCG/ACT AEPB Inhale 1 puff into the lungs every 4 (four) hours as needed (Shortness of breath).   Marland Kitchen aspirin EC 81 MG tablet Take 81 mg by mouth daily.  Marland Kitchen atorvastatin (LIPITOR) 80 MG tablet Take 1 tablet (80 mg total) by mouth daily at 6 PM.  . carvedilol (COREG) 6.25 MG tablet Take 1 tablet (6.25 mg total) by mouth 2 (two) times daily.  . clonazePAM (KLONOPIN) 1 MG tablet Take 1 tablet (1 mg total) by mouth at bedtime.  . clopidogrel (PLAVIX) 75 MG tablet Take 4 tablets (300 mg total) the first day.  Then take 1 tablet (75 mg total) once a day.  . digoxin (LANOXIN) 0.125 MG tablet TAKE 1 TABLET BY MOUTH DAILY  . ENTRESTO 49-51 MG PLEASE SEE ATTACHED FOR DETAILED DIRECTIONS  . FLUoxetine (PROZAC) 20 MG capsule Take 3 capsules (60 mg total) by mouth daily for 30 days.  . fluticasone furoate-vilanterol (BREO ELLIPTA) 200-25 MCG/INH AEPB Inhale 1 puff into the lungs daily.   . furosemide (LASIX) 80 MG tablet TAKE 1 TABLET (80 MG TOTAL) BY MOUTH DAILY. TAKE ADDITIONAL 80 MG IF WEIGHT IS GREATER THAN 266LB  . lamoTRIgine (LAMICTAL) 25 MG tablet Take 1 tablet (25 mg total) by mouth 3 (three) times daily.  . metFORMIN (GLUCOPHAGE) 500 MG tablet Take 0.5 tablets (250 mg total) by mouth every morning.  . OXYGEN Inhale 5 L into the lungs continuous.   . pantoprazole (PROTONIX) 40 MG tablet Take 1 tablet (40 mg total) by mouth daily.  . Potassium Chloride ER 20 MEQ TBCR Take 1 tablet by mouth 2 (two) times daily.  . potassium chloride SA (K-DUR) 20 MEQ tablet Take 2 tablets (40 mEq total) by mouth daily for 30 days.  Marland Kitchen spironolactone (ALDACTONE) 25 MG tablet TAKE 1/2 TABLET BY MOUTH EVERY DAY  . tamsulosin (FLOMAX) 0.4 MG CAPS  capsule Take 1 capsule (0.4 mg total) by mouth at bedtime.     Allergies:   Patient has no known allergies.   Social History   Socioeconomic History  . Marital status: Widowed    Spouse name: Not on file  . Number of children: Not on file  . Years of education: Not on file  . Highest education level: Not on file  Occupational History  . Not on file  Tobacco Use  . Smoking status: Former Smoker    Packs/day: 2.00    Types: Cigarettes    Quit date: 12/16/2005    Years since quitting: 13.7  . Smokeless tobacco: Never Used  . Tobacco comment: quit in 2007  Substance and Sexual Activity  . Alcohol use: Not Currently  . Drug use: No  .  Sexual activity: Not Currently  Other Topics Concern  . Not on file  Social History Narrative  . Not on file   Social Determinants of Health   Financial Resource Strain: Low Risk   . Difficulty of Paying Living Expenses: Not hard at all  Food Insecurity: No Food Insecurity  . Worried About Programme researcher, broadcasting/film/video in the Last Year: Never true  . Ran Out of Food in the Last Year: Never true  Transportation Needs: No Transportation Needs  . Lack of Transportation (Medical): No  . Lack of Transportation (Non-Medical): No  Physical Activity:   . Days of Exercise per Week: Not on file  . Minutes of Exercise per Session: Not on file  Stress: No Stress Concern Present  . Feeling of Stress : Not at all  Social Connections: Unknown  . Frequency of Communication with Friends and Family: Not on file  . Frequency of Social Gatherings with Friends and Family: Not on file  . Attends Religious Services: Not on file  . Active Member of Clubs or Organizations: Not on file  . Attends Banker Meetings: Not on file  . Marital Status: Widowed     Family History: The patient's family history includes Colon cancer in his mother; Heart failure in his mother; Kidney cancer in his father and mother; Thyroid disease in his sister. ROS:   Please see the  history of present illness.    All other systems reviewed and are negative.  EKGs/Labs/Other Studies Reviewed:    The following studies were reviewed today:    Recent Labs: 12/17/2018: TSH 3.195 12/25/2018: Magnesium 2.2 03/12/2019: ALT 11 04/22/2019: Hemoglobin 12.1; Platelets 307 06/15/2019: NT-Pro BNP 2,347 07/13/2019: BNP 714.6; BUN 14; Creatinine, Ser 1.03; Potassium 4.8; Sodium 139  Recent Lipid Panel    Component Value Date/Time   CHOL 191 10/16/2018 0250   TRIG 49 10/16/2018 0250   HDL 62 10/16/2018 0250   CHOLHDL 3.1 10/16/2018 0250   VLDL 10 10/16/2018 0250   LDLCALC 119 (H) 10/16/2018 0250    Physical Exam:    VS:  BP 120/76   Pulse 75   Temp (!) 97.4 F (36.3 C)   Ht 6' (1.829 m)   Wt 257 lb 1.9 oz (116.6 kg)   SpO2 98%   BMI 34.87 kg/m     Wt Readings from Last 3 Encounters:  09/09/19 257 lb 1.9 oz (116.6 kg)  08/04/19 260 lb 9.6 oz (118.2 kg)  07/13/19 262 lb (118.8 kg)     GEN: He looks increasingly frail well nourished, well developed in no acute distress HEENT: Normal NECK: No JVD; No carotid bruits LYMPHATICS: No lymphadenopathy CARDIAC: RRR, no murmurs, rubs, gallops RESPIRATORY:  Clear to auscultation without rales, wheezing or rhonchi  ABDOMEN: Soft, non-tender, non-distended MUSCULOSKELETAL:  No edema; No deformity  SKIN: Warm and dry NEUROLOGIC:  Alert and oriented x 3 PSYCHIATRIC:  Normal affect    Signed, Norman Herrlich, MD  09/09/2019 1:21 PM    [facility]

## 2019-09-09 ENCOUNTER — Ambulatory Visit (INDEPENDENT_AMBULATORY_CARE_PROVIDER_SITE_OTHER): Payer: Medicare Other | Admitting: Cardiology

## 2019-09-09 ENCOUNTER — Other Ambulatory Visit: Payer: Self-pay

## 2019-09-09 ENCOUNTER — Encounter: Payer: Self-pay | Admitting: Cardiology

## 2019-09-09 VITALS — BP 120/76 | HR 75 | Temp 97.4°F | Ht 72.0 in | Wt 257.1 lb

## 2019-09-09 DIAGNOSIS — I5022 Chronic systolic (congestive) heart failure: Secondary | ICD-10-CM

## 2019-09-09 DIAGNOSIS — Z95810 Presence of automatic (implantable) cardiac defibrillator: Secondary | ICD-10-CM | POA: Diagnosis not present

## 2019-09-09 DIAGNOSIS — I110 Hypertensive heart disease with heart failure: Secondary | ICD-10-CM

## 2019-09-09 DIAGNOSIS — E782 Mixed hyperlipidemia: Secondary | ICD-10-CM

## 2019-09-09 DIAGNOSIS — J449 Chronic obstructive pulmonary disease, unspecified: Secondary | ICD-10-CM

## 2019-09-09 DIAGNOSIS — Z79899 Other long term (current) drug therapy: Secondary | ICD-10-CM

## 2019-09-09 DIAGNOSIS — I2510 Atherosclerotic heart disease of native coronary artery without angina pectoris: Secondary | ICD-10-CM

## 2019-09-09 NOTE — Patient Instructions (Addendum)
Medication Instructions:  Your physician has recommended you make the following change in your medication:  1-Can stop Plavix on 10/20/19, this is one year after your stent.  *If you need a refill on your cardiac medications before your next appointment, please call your pharmacy*  Lab Work: Your physician recommends that you have lab work today- Digoxin level, BMET, Pro BNP, Magnesium level  If you have labs (blood work) drawn today and your tests are completely normal, you will receive your results only by: Marland Kitchen MyChart Message (if you have MyChart) OR . A paper copy in the mail If you have any lab test that is abnormal or we need to change your treatment, we will call you to review the results.  Follow-Up: At [facility], you and your health needs are our priority.  As part of our continuing mission to provide you with exceptional heart care, we have created designated Provider Care Teams.  These Care Teams include your primary Cardiologist (physician) and Advanced Practice Providers (APPs -  Physician Assistants and Nurse Practitioners) who all work together to provide you with the care you need, when you need it.  We recommend signing up for the patient portal called "MyChart".  Sign up information is provided on this After Visit Summary.  MyChart is used to connect with patients for Virtual Visits (Telemedicine).  Patients are able to view lab/test results, encounter notes, upcoming appointments, etc.  Non-urgent messages can be sent to your provider as well.   To learn more about what you can do with MyChart, go to [email or web address].    Your next appointment:   3 month(s)  The format for your next appointment:   In Person  Provider:   You may see Norman Herrlich, MD or the following Advanced Practice Provider on your designated Care Team:    Gillian Shields, FNP  You have been referred to Palliative Care, someone will call you to arrange an appointment.

## 2019-09-10 LAB — BASIC METABOLIC PANEL
BUN/Creatinine Ratio: 13 (ref 10–24)
BUN: 13 mg/dL (ref 8–27)
CO2: 28 mmol/L (ref 20–29)
Calcium: 9.1 mg/dL (ref 8.6–10.2)
Chloride: 99 mmol/L (ref 96–106)
Creatinine, Ser: 1.04 mg/dL (ref 0.76–1.27)
GFR calc Af Amer: 80 mL/min/{1.73_m2} (ref >59)
GFR calc non Af Amer: 69 mL/min/{1.73_m2} (ref >59)
Glucose: 105 mg/dL — ABNORMAL HIGH (ref 65–99)
Potassium: 4.5 mmol/L (ref 3.5–5.2)
Sodium: 139 mmol/L (ref 134–144)

## 2019-09-10 LAB — PRO B NATRIURETIC PEPTIDE: NT-Pro BNP: 1418 pg/mL — ABNORMAL HIGH (ref 0–486)

## 2019-09-10 LAB — MAGNESIUM: Magnesium: 2.2 mg/dL (ref 1.6–2.3)

## 2019-09-10 LAB — DIGOXIN LEVEL: Digoxin, Serum: 0.6 ng/mL (ref 0.5–0.9)

## 2019-09-11 ENCOUNTER — Telehealth: Payer: Self-pay | Admitting: Cardiology

## 2019-09-11 NOTE — Telephone Encounter (Signed)
  Pt c/o medication issue:  1. Name of Medication:   clopidogrel (PLAVIX) 75 MG tablet    2. How are you currently taking this medication (dosage and times per day)?   3. Are you having a reaction (difficulty breathing--STAT)?   4. What is your medication issue? Pt needs clarification with the dosage, it says take 4 tablets the first day then take 1 tablet daily, however, pt been taking 1 tablet daily for years now. Also, pt wanted to ask Dr. Dulce Sellar if he can switch palliative care. Dr. Dulce Sellar ordered it at [facility], he ask if he can go to [facility] [place].   Please call

## 2019-09-11 NOTE — Addendum Note (Signed)
Addended by: Roney Mans A on: 09/11/2019 09:57 AM   Modules accepted: Orders

## 2019-09-11 NOTE — Telephone Encounter (Signed)
Returned call to Pt.  Left detailed message.  Clarified Plavix instruction-advised to continue taking one tablet daily until October 20, 2019 and then STOP.  Addended last office visit to redirect palliative consult to hospice of Pt choice.

## 2019-09-14 ENCOUNTER — Telehealth: Payer: Self-pay | Admitting: Cardiology

## 2019-09-14 DIAGNOSIS — I5022 Chronic systolic (congestive) heart failure: Secondary | ICD-10-CM

## 2019-09-14 DIAGNOSIS — Z9981 Dependence on supplemental oxygen: Secondary | ICD-10-CM

## 2019-09-14 NOTE — Telephone Encounter (Signed)
Yes please send referral to hospice of his choice

## 2019-09-14 NOTE — Telephone Encounter (Signed)
Message forwarded to Dr. Dulce Sellar to ask if he is willing to send referral for hospice

## 2019-09-14 NOTE — Telephone Encounter (Signed)
New message  Per Marcelino Duster needs a hospice referral for patient. Please fax to [phone].

## 2019-09-15 NOTE — Telephone Encounter (Signed)
Referral for hospice has been sent.

## 2019-09-15 NOTE — Telephone Encounter (Signed)
Routing to [employer] triage

## 2019-09-16 ENCOUNTER — Other Ambulatory Visit: Payer: Self-pay | Admitting: Cardiology

## 2019-09-28 ENCOUNTER — Other Ambulatory Visit: Payer: Self-pay | Admitting: Cardiology

## 2019-10-15 ENCOUNTER — Telehealth: Payer: Self-pay | Admitting: Cardiology

## 2019-10-15 NOTE — Telephone Encounter (Signed)
Pt understands that having tachytherapies turned off would mean that in the event he had further VT (like in January) his ICD would NOT fire and he would likely pass away.  He verbalizes understanding of this, and would like a visit with Dr. Elberta Fortis to discuss ICD de-activation.  He [place] about [place] block from [employer], and would like to be seen there if possible.   All questions answered.  Casimiro Needle [address]" [place], PA-C  10/15/2019 2:29 PM

## 2019-10-15 NOTE — Telephone Encounter (Signed)
Patient states that he has been advised by [facility] to discontinue use of defibrillator. Please advise.

## 2019-10-15 NOTE — Telephone Encounter (Signed)
Will send to device clinic  

## 2019-11-02 ENCOUNTER — Other Ambulatory Visit: Payer: Self-pay

## 2019-11-02 ENCOUNTER — Ambulatory Visit (INDEPENDENT_AMBULATORY_CARE_PROVIDER_SITE_OTHER): Admitting: *Deleted

## 2019-11-02 ENCOUNTER — Encounter: Payer: Self-pay | Admitting: Cardiology

## 2019-11-02 ENCOUNTER — Ambulatory Visit (INDEPENDENT_AMBULATORY_CARE_PROVIDER_SITE_OTHER): Admitting: Cardiology

## 2019-11-02 VITALS — BP 130/68 | HR 71 | Ht 72.0 in | Wt 272.0 lb

## 2019-11-02 DIAGNOSIS — I255 Ischemic cardiomyopathy: Secondary | ICD-10-CM

## 2019-11-02 DIAGNOSIS — Z95810 Presence of automatic (implantable) cardiac defibrillator: Secondary | ICD-10-CM

## 2019-11-02 DIAGNOSIS — I5022 Chronic systolic (congestive) heart failure: Secondary | ICD-10-CM

## 2019-11-02 DIAGNOSIS — I480 Paroxysmal atrial fibrillation: Secondary | ICD-10-CM

## 2019-11-02 LAB — CUP PACEART REMOTE DEVICE CHECK
Battery Remaining Longevity: 104 mo
Battery Remaining Percentage: 94 %
Battery Voltage: 3.04 V
Brady Statistic RV Percent Paced: 1 %
Date Time Interrogation Session: 20210503031101
HighPow Impedance: 65 Ohm
Implantable Lead Implant Date: 20201028
Implantable Lead Location: 753860
Implantable Lead Model: 7122
Implantable Pulse Generator Implant Date: 20201028
Lead Channel Impedance Value: 490 Ohm
Lead Channel Pacing Threshold Amplitude: 0.75 V
Lead Channel Pacing Threshold Pulse Width: 0.5 ms
Lead Channel Sensing Intrinsic Amplitude: 11.4 mV
Lead Channel Setting Pacing Amplitude: 2.5 V
Lead Channel Setting Pacing Pulse Width: 0.5 ms
Lead Channel Setting Sensing Sensitivity: 0.5 mV
Pulse Gen Serial Number: 111007934

## 2019-11-02 LAB — CUP PACEART INCLINIC DEVICE CHECK
Battery Remaining Longevity: 105 mo
Brady Statistic RV Percent Paced: 0 %
Date Time Interrogation Session: 20210503162443
HighPow Impedance: 65.25 Ohm
Implantable Lead Implant Date: 20201028
Implantable Lead Location: 753860
Implantable Lead Model: 7122
Implantable Pulse Generator Implant Date: 20201028
Lead Channel Impedance Value: 512.5 Ohm
Lead Channel Pacing Threshold Amplitude: 0.75 V
Lead Channel Pacing Threshold Pulse Width: 0.5 ms
Lead Channel Sensing Intrinsic Amplitude: 11.6 mV
Lead Channel Setting Pacing Amplitude: 2.5 V
Lead Channel Setting Pacing Pulse Width: 0.5 ms
Lead Channel Setting Sensing Sensitivity: 0.5 mV
Pulse Gen Serial Number: 111007934

## 2019-11-02 NOTE — Progress Notes (Signed)
Remote ICD transmission.   

## 2019-11-02 NOTE — Progress Notes (Signed)
Electrophysiology Office Note   Date:  11/02/2019   ID:  Justin Rout., DOB 07/05/1940, MRN [id number]  PCP:  Charleston Poot, MD  Cardiologist: Bettina Gavia Primary Electrophysiologist:  Kaleiah Kutzer Meredith Leeds, MD    No chief complaint on file.    History of Present Illness: Justin Bradford. is a 79 y.o. male who is being seen today for the evaluation of ischemic cardiomyopathy at the request of Shirlee More. Presenting today for electrophysiology evaluation.  He has a history significant for ischemic cardiomyopathy, hypertension, coronary artery disease.  He had a late presentation of an anterior MI with a 100% occluded LAD in August 2020.  During that time, he developed atrial fibrillation with rapid rates.  He was initially put on amiodarone, but has had no further recurrences.  Repeat echocardiogram shows an ejection fraction of 35%.  07/28/2018, he had an episode of ventricular tachycardia noted on device interrogation.  This was treated with ATP therapy which terminated tachycardia after one burst.  Today, denies symptoms of palpitations, chest pain, shortness of breath, orthopnea, PND, lower extremity edema, claudication, dizziness, presyncope, syncope, bleeding, or neurologic sequela. The patient is tolerating medications without difficulties.  Soda ventricular tachycardia, he has had no further episodes.  He does have significant lung disease and is on oxygen.  He has been enrolled in hospice.  He presents today for discussion of ICD therapy and turning his ICD off.  Past Medical History:  Diagnosis Date  . Acute systolic heart failure ([place])   . Arthritis   . COPD (chronic obstructive pulmonary disease) ([place])   . Heart attack ([place])   . Hypertension   . Peripheral neuropathy   . Pneumonia    Past Surgical History:  Procedure Laterality Date  . CARDIAC CATHETERIZATION    . CORONARY STENT INTERVENTION N/A 10/16/2018   Procedure: CORONARY STENT INTERVENTION;  Surgeon: Troy Sine, MD;  Location: [facility];  Service: Cardiovascular;  Laterality: N/A;  . ICD IMPLANT N/A 04/29/2019   Procedure: ICD IMPLANT;  Surgeon: Constance Haw, MD;  Location: [facility];  Service: Cardiovascular;  Laterality: N/A;  . [place]  . LEFT HEART CATH AND CORONARY ANGIOGRAPHY N/A 10/16/2018   Procedure: LEFT HEART CATH AND CORONARY ANGIOGRAPHY;  Surgeon: Troy Sine, MD;  Location: [facility];  Service: Cardiovascular;  Laterality: N/A;  . TONSILLECTOMY       Current Outpatient Medications  Medication Sig Dispense Refill  . acetaminophen (TYLENOL) 325 MG tablet Take 2 tablets (650 mg total) by mouth every 4 (four) hours as needed for headache or mild pain.    . Albuterol Sulfate 108 (90 Base) MCG/ACT AEPB Inhale 1 puff into the lungs every 4 (four) hours as needed (Shortness of breath).     Marland Kitchen aspirin EC 81 MG tablet Take 81 mg by mouth daily.    Marland Kitchen atorvastatin (LIPITOR) 80 MG tablet Take 1 tablet (80 mg total) by mouth daily at 6 PM. 30 tablet 0  . carvedilol (COREG) 6.25 MG tablet Take 1 tablet (6.25 mg total) by mouth 2 (two) times daily. 180 tablet 3  . clonazePAM (KLONOPIN) 1 MG tablet Take 1 tablet (1 mg total) by mouth at bedtime. 20 tablet 0  . clopidogrel (PLAVIX) 75 MG tablet Take 1 tablet (75 mg total) by mouth daily. 90 tablet 3  . digoxin (LANOXIN) 0.125 MG tablet TAKE 1 TABLET BY MOUTH DAILY 90 tablet 1  .  ENTRESTO 49-51 MG PLEASE SEE ATTACHED FOR DETAILED DIRECTIONS 60 tablet 2  . FLUoxetine (PROZAC) 20 MG capsule Take 3 capsules (60 mg total) by mouth daily for 30 days. 90 capsule 0  . fluticasone furoate-vilanterol (BREO ELLIPTA) 200-25 MCG/INH AEPB Inhale 1 puff into the lungs daily.     . furosemide (LASIX) 80 MG tablet TAKE 1 TABLET (80 MG TOTAL) BY MOUTH DAILY. TAKE ADDITIONAL 80 MG IF WEIGHT IS GREATER THAN 266LB 90 tablet 0  . lamoTRIgine (LAMICTAL) 25 MG tablet Take 1 tablet (25 mg total) by mouth 3 (three)  times daily. 60 tablet 0  . metFORMIN (GLUCOPHAGE) 500 MG tablet Take 0.5 tablets (250 mg total) by mouth every morning. 30 tablet 0  . nitroGLYCERIN (NITROSTAT) 0.4 MG SL tablet PLACE 1 TABLET (0.4 MG TOTAL) UNDER THE TONGUE EVERY 5 (FIVE) MINUTES AS NEEDED FOR CHEST PAIN. 25 tablet 3  . OXYGEN Inhale 5 L into the lungs continuous.     . pantoprazole (PROTONIX) 40 MG tablet Take 1 tablet (40 mg total) by mouth daily. 30 tablet 0  . Potassium Chloride ER 20 MEQ TBCR Take 1 tablet by mouth 2 (two) times daily.    . potassium chloride SA (K-DUR) 20 MEQ tablet Take 2 tablets (40 mEq total) by mouth daily for 30 days. 60 tablet 0  . spironolactone (ALDACTONE) 25 MG tablet TAKE 1/2 TABLET BY MOUTH EVERY DAY 45 tablet 1  . tamsulosin (FLOMAX) 0.4 MG CAPS capsule Take 1 capsule (0.4 mg total) by mouth at bedtime. 30 capsule 0   No current facility-administered medications for this visit.    Allergies:   Patient has no known allergies.   Social History:  The patient  reports that he quit smoking about 13 years ago. His smoking use included cigarettes. He smoked 2.00 packs per day. He has never used smokeless tobacco. He reports previous alcohol use. He reports that he does not use drugs.   Family History:  The patient's family history includes Colon cancer in his mother; Heart failure in his mother; Kidney cancer in his father and mother; Thyroid disease in his sister.    ROS:  Please see the history of present illness.   Otherwise, review of systems is positive for none.   All other systems are reviewed and negative.   PHYSICAL EXAM: VS:  BP 130/68   Pulse 71   Ht 6' (1.829 m)   Wt 272 lb (123.4 kg)   BMI 36.89 kg/m  , BMI Body mass index is 36.89 kg/m. GEN: Well nourished, well developed, in no acute distress  HEENT: normal  Neck: no JVD, carotid bruits, or masses Cardiac: RRR; no murmurs, rubs, or gallops,no edema  Respiratory:  clear to auscultation bilaterally, normal work of  breathing GI: soft, nontender, nondistended, + BS MS: no deformity or atrophy  Skin: warm and dry, device site well healed Neuro:  Strength and sensation are intact Psych: euthymic mood, full affect  EKG:  EKG is not ordered today. Personal review of the ekg ordered 08/03/18 shows sinus rhythm, IVCD  Personal review of the device interrogation today. Results in [facility]    Recent Labs: 12/17/2018: TSH 3.195 03/12/2019: ALT 11 04/22/2019: Hemoglobin 12.1; Platelets 307 07/13/2019: BNP 714.6 09/09/2019: BUN 13; Creatinine, Ser 1.04; Magnesium 2.2; NT-Pro BNP 1,418; Potassium 4.5; Sodium 139    Lipid Panel     Component Value Date/Time   CHOL 191 10/16/2018 0250   TRIG 49 10/16/2018 0250   HDL 62  10/16/2018 0250   CHOLHDL 3.1 10/16/2018 0250   VLDL 10 10/16/2018 0250   LDLCALC 119 (H) 10/16/2018 0250     Wt Readings from Last 3 Encounters:  11/02/19 272 lb (123.4 kg)  09/09/19 257 lb 1.9 oz (116.6 kg)  08/04/19 260 lb 9.6 oz (118.2 kg)      Other studies Reviewed: Additional studies/ records that were reviewed today include: TTE 12/10/2018 Review of the above records today demonstrates:   1. The left ventricle has moderately reduced systolic function, with an ejection fraction of 35%. The cavity size was normal. There is moderate concentric left ventricular hypertrophy. Left ventricular diastolic Doppler parameters are indeterminate.  2. The right ventricle has normal systolic function. The cavity was normal. There is no increase in right ventricular wall thickness.  3. The mitral valve is degenerative. There is mild to moderate mitral annular calcification present. No evidence of mitral valve stenosis.  4. Mild thickening of the aortic valve. Mild focal calcification of the aortic valve. Aortic valve regurgitation is mild by color flow Doppler.  5. 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  6. Stage 1: 1: Multiple segmental abnormalities exist. See  findings.  Left heart cath 10/16/2018  Prox RCA lesion is 20% stenosed.  Ost LAD to Prox LAD lesion is 100% stenosed.  Dist Cx lesion is 60% stenosed.  Post intervention, there is a 55% residual stenosis.  A stent was successfully placed.   ASSESSMENT AND PLAN:  1.  Chronic systolic heart failure due to ischemic cardiomyopathy: NYHA class II symptoms on optimal medical therapy.  Status post Saint Jude ICD implanted 04/29/2019.  Device functioning appropriately.  2.  Coronary artery disease: Late presenting MI with LAD occlusion.  Status post LAD stent.  No current chest pain.  3.  Hypertension: Currently well controlled  4.  Ventricular tachycardia: Received ATP July 28, 2018.  Was symptomatic with dizziness while sitting in a chair.  Currently on carvedilol.  He is enrolled in hospice therapy.  He wishes to have his ICD turned off.  I spent 40 minutes discussing with him and his neighbor what it would mean to have ICD therapies turned off.  We discussed heart attack risk and VT/VF.  We discussed the outcomes if he did go into VT/VF and had his defibrillator turned off.  He is a DNR.  He understands that if he goes into VT and VF that this would potentially be a life-threatening arrhythmia.  He does wish to have the defibrillator turned off which we Justin Bekker do today.  Case discussed with primary cardiology  Current medicines are reviewed at length with the patient today.   The patient does not have concerns regarding his medicines.  The following changes were made today: None  Labs/ tests ordered today include:  No orders of the defined types were placed in this encounter.   Disposition:   FU with Justin Bradford 12 months  Signed, Justin Giebel Jorja Loa, MD  11/02/2019 4:14 PM     [facility] [address] [place] [address] [phone] (office) [phone] (fax)

## 2019-11-02 NOTE — Patient Instructions (Signed)
Medication Instructions:  Your physician recommends that you continue on your current medications as directed. Please refer to the Current Medication list given to you today.  *If you need a refill on your cardiac medications before your next appointment, please call your pharmacy*   Lab Work: None ordered If you have labs (blood work) drawn today and your tests are completely normal, you will receive your results only by: . MyChart Message (if you have MyChart) OR . A paper copy in the mail If you have any lab test that is abnormal or we need to change your treatment, we will call you to review the results.   Testing/Procedures: None ordered   Follow-Up: Remote monitoring is used to monitor your Pacemaker of ICD from home. This monitoring reduces the number of office visits required to check your device to one time per year. It allows us to keep an eye on the functioning of your device to ensure it is working properly. You are scheduled for a device check from home on 02/01/2020. You may send your transmission at any time that day. If you have a wireless device, the transmission will be sent automatically. After your physician reviews your transmission, you will receive a postcard with your next transmission date.  At [facility], you and your health needs are our priority.  As part of our continuing mission to provide you with exceptional heart care, we have created designated Provider Care Teams.  These Care Teams include your primary Cardiologist (physician) and Advanced Practice Providers (APPs -  Physician Assistants and Nurse Practitioners) who all work together to provide you with the care you need, when you need it.  We recommend signing up for the patient portal called "MyChart".  Sign up information is provided on this After Visit Summary.  MyChart is used to connect with patients for Virtual Visits (Telemedicine).  Patients are able to view lab/test results, encounter notes,  upcoming appointments, etc.  Non-urgent messages can be sent to your provider as well.   To learn more about what you can do with MyChart, go to [email or web address].    Your next appointment:   1 year(s)  The format for your next appointment:   In Person  Provider:   Will Camnitz, MD   Thank you for choosing [facility]!!   Vernice Bowker, RN [phone]    Other Instructions    

## 2019-12-03 ENCOUNTER — Telehealth: Payer: Self-pay

## 2019-12-03 NOTE — Telephone Encounter (Signed)
The pt wanted to know if his appointments was in office or with the home monitor? I let him know if was with his home remote monitor. He do not need to come into the office. The pt verbalized understanding and thanked me for calling him back.

## 2019-12-11 ENCOUNTER — Ambulatory Visit: Payer: Medicare Other | Admitting: Cardiology

## 2019-12-14 ENCOUNTER — Telehealth: Payer: Self-pay

## 2019-12-14 MED ORDER — FUROSEMIDE 80 MG PO TABS: ORAL_TABLET | ORAL | 2 refills | Status: AC

## 2019-12-14 NOTE — Telephone Encounter (Signed)
Refill sent in for Furosemide 80 mg.

## 2020-02-01 ENCOUNTER — Ambulatory Visit (INDEPENDENT_AMBULATORY_CARE_PROVIDER_SITE_OTHER): Admitting: *Deleted

## 2020-02-01 DIAGNOSIS — I255 Ischemic cardiomyopathy: Secondary | ICD-10-CM | POA: Diagnosis not present

## 2020-02-02 LAB — CUP PACEART REMOTE DEVICE CHECK
Battery Remaining Longevity: 101 mo
Battery Remaining Percentage: 92 %
Battery Voltage: 3.02 V
Brady Statistic RV Percent Paced: 1 %
Date Time Interrogation Session: 20210803110258
HighPow Impedance: 80 Ohm
Implantable Lead Implant Date: 20201028
Implantable Lead Location: 753860
Implantable Lead Model: 7122
Implantable Pulse Generator Implant Date: 20201028
Lead Channel Impedance Value: 440 Ohm
Lead Channel Pacing Threshold Amplitude: 0.75 V
Lead Channel Pacing Threshold Pulse Width: 0.5 ms
Lead Channel Sensing Intrinsic Amplitude: 12 mV
Lead Channel Setting Pacing Amplitude: 2.5 V
Lead Channel Setting Pacing Pulse Width: 0.5 ms
Lead Channel Setting Sensing Sensitivity: 0.5 mV
Pulse Gen Serial Number: 111007934

## 2020-02-03 NOTE — Progress Notes (Signed)
Remote ICD transmission.   

## 2020-05-02 ENCOUNTER — Ambulatory Visit (INDEPENDENT_AMBULATORY_CARE_PROVIDER_SITE_OTHER)

## 2020-05-02 DIAGNOSIS — I255 Ischemic cardiomyopathy: Secondary | ICD-10-CM | POA: Diagnosis not present

## 2020-05-02 LAB — CUP PACEART REMOTE DEVICE CHECK
Battery Remaining Longevity: 98 mo
Battery Remaining Percentage: 90 %
Battery Voltage: 3.01 V
Brady Statistic RV Percent Paced: 1 %
Date Time Interrogation Session: 20211101020514
HighPow Impedance: 73 Ohm
Implantable Lead Implant Date: 20201028
Implantable Lead Location: 753860
Implantable Lead Model: 7122
Implantable Pulse Generator Implant Date: 20201028
Lead Channel Impedance Value: 400 Ohm
Lead Channel Pacing Threshold Amplitude: 0.75 V
Lead Channel Pacing Threshold Pulse Width: 0.5 ms
Lead Channel Sensing Intrinsic Amplitude: 12 mV
Lead Channel Setting Pacing Amplitude: 2.5 V
Lead Channel Setting Pacing Pulse Width: 0.5 ms
Lead Channel Setting Sensing Sensitivity: 0.5 mV
Pulse Gen Serial Number: 111007934

## 2020-05-04 NOTE — Progress Notes (Signed)
Remote ICD transmission.   

## 2020-06-14 ENCOUNTER — Other Ambulatory Visit: Payer: Self-pay | Admitting: Cardiology

## 2020-08-01 ENCOUNTER — Ambulatory Visit (INDEPENDENT_AMBULATORY_CARE_PROVIDER_SITE_OTHER)

## 2020-08-01 DIAGNOSIS — I255 Ischemic cardiomyopathy: Secondary | ICD-10-CM

## 2020-08-01 LAB — CUP PACEART REMOTE DEVICE CHECK
Battery Remaining Longevity: 98 mo
Battery Remaining Percentage: 89 %
Battery Voltage: 3.01 V
Brady Statistic RV Percent Paced: 1 %
Date Time Interrogation Session: 20220131011007
HighPow Impedance: 92 Ohm
Implantable Lead Implant Date: 20201028
Implantable Lead Location: 753860
Implantable Lead Model: 7122
Implantable Pulse Generator Implant Date: 20201028
Lead Channel Impedance Value: 530 Ohm
Lead Channel Pacing Threshold Amplitude: 0.75 V
Lead Channel Pacing Threshold Pulse Width: 0.5 ms
Lead Channel Sensing Intrinsic Amplitude: 12 mV
Lead Channel Setting Pacing Amplitude: 2.5 V
Lead Channel Setting Pacing Pulse Width: 0.5 ms
Lead Channel Setting Sensing Sensitivity: 0.5 mV
Pulse Gen Serial Number: 111007934

## 2020-08-08 NOTE — Progress Notes (Signed)
Remote ICD transmission.   

## 2020-10-31 ENCOUNTER — Ambulatory Visit (INDEPENDENT_AMBULATORY_CARE_PROVIDER_SITE_OTHER): Payer: Self-pay

## 2020-10-31 DIAGNOSIS — I255 Ischemic cardiomyopathy: Secondary | ICD-10-CM

## 2020-10-31 LAB — CUP PACEART REMOTE DEVICE CHECK
Battery Remaining Longevity: 107 mo
Battery Remaining Percentage: 86 %
Battery Voltage: 3.01 V
Brady Statistic RV Percent Paced: 1 %
Date Time Interrogation Session: 20220502031636
HighPow Impedance: 84 Ohm
Implantable Lead Implant Date: 20201028
Implantable Lead Location: 753860
Implantable Lead Model: 7122
Implantable Pulse Generator Implant Date: 20201028
Lead Channel Impedance Value: 480 Ohm
Lead Channel Pacing Threshold Amplitude: 0.75 V
Lead Channel Pacing Threshold Pulse Width: 0.5 ms
Lead Channel Sensing Intrinsic Amplitude: 12 mV
Lead Channel Setting Pacing Amplitude: 2.5 V
Lead Channel Setting Pacing Pulse Width: 0.5 ms
Lead Channel Setting Sensing Sensitivity: 0.5 mV
Pulse Gen Serial Number: 111007934

## 2020-11-17 NOTE — Progress Notes (Signed)
Remote ICD transmission.   

## 2021-01-30 ENCOUNTER — Ambulatory Visit (INDEPENDENT_AMBULATORY_CARE_PROVIDER_SITE_OTHER): Payer: Self-pay

## 2021-01-30 DIAGNOSIS — I255 Ischemic cardiomyopathy: Secondary | ICD-10-CM

## 2021-01-31 LAB — CUP PACEART REMOTE DEVICE CHECK
Battery Remaining Longevity: 105 mo
Battery Remaining Percentage: 85 %
Battery Voltage: 3.01 V
Brady Statistic RV Percent Paced: 1 %
Date Time Interrogation Session: 20220801151735
HighPow Impedance: 80 Ohm
Implantable Lead Implant Date: 20201028
Implantable Lead Location: 753860
Implantable Lead Model: 7122
Implantable Pulse Generator Implant Date: 20201028
Lead Channel Impedance Value: 460 Ohm
Lead Channel Pacing Threshold Amplitude: 0.75 V
Lead Channel Pacing Threshold Pulse Width: 0.5 ms
Lead Channel Sensing Intrinsic Amplitude: 12 mV
Lead Channel Setting Pacing Amplitude: 2.5 V
Lead Channel Setting Pacing Pulse Width: 0.5 ms
Lead Channel Setting Sensing Sensitivity: 0.5 mV
Pulse Gen Serial Number: 111007934

## 2021-02-22 NOTE — Progress Notes (Signed)
Remote ICD transmission.   

## 2021-06-01 DEATH — deceased
